# Patient Record
Sex: Female | Born: 1950 | Race: White | Hispanic: No | Marital: Single | Smoking: Never smoker
Health system: Southern US, Community
[De-identification: ages and names within clinical notes are randomized; demographics above are authoritative.]

## PROBLEM LIST (undated history)

## (undated) DIAGNOSIS — G4731 Primary central sleep apnea: Secondary | ICD-10-CM

## (undated) DIAGNOSIS — D649 Anemia, unspecified: Secondary | ICD-10-CM

## (undated) DIAGNOSIS — E785 Hyperlipidemia, unspecified: Secondary | ICD-10-CM

## (undated) DIAGNOSIS — F32A Depression, unspecified: Secondary | ICD-10-CM

## (undated) DIAGNOSIS — F419 Anxiety disorder, unspecified: Secondary | ICD-10-CM

## (undated) DIAGNOSIS — G4733 Obstructive sleep apnea (adult) (pediatric): Secondary | ICD-10-CM

## (undated) DIAGNOSIS — M1990 Unspecified osteoarthritis, unspecified site: Secondary | ICD-10-CM

## (undated) DIAGNOSIS — I639 Cerebral infarction, unspecified: Secondary | ICD-10-CM

## (undated) DIAGNOSIS — I10 Essential (primary) hypertension: Secondary | ICD-10-CM

## (undated) DIAGNOSIS — T56891A Toxic effect of other metals, accidental (unintentional), initial encounter: Secondary | ICD-10-CM

## (undated) DIAGNOSIS — F329 Major depressive disorder, single episode, unspecified: Secondary | ICD-10-CM

## (undated) DIAGNOSIS — N179 Acute kidney failure, unspecified: Secondary | ICD-10-CM

## (undated) DIAGNOSIS — E870 Hyperosmolality and hypernatremia: Secondary | ICD-10-CM

## (undated) DIAGNOSIS — F259 Schizoaffective disorder, unspecified: Secondary | ICD-10-CM

## (undated) DIAGNOSIS — R55 Syncope and collapse: Secondary | ICD-10-CM

## (undated) DIAGNOSIS — R8281 Pyuria: Secondary | ICD-10-CM

## (undated) HISTORY — PX: BREAST SURGERY: SHX581

## (undated) HISTORY — DX: Primary central sleep apnea: G47.31

---

## 2014-11-07 DIAGNOSIS — Z79899 Other long term (current) drug therapy: Secondary | ICD-10-CM | POA: Diagnosis not present

## 2014-11-07 DIAGNOSIS — Z23 Encounter for immunization: Secondary | ICD-10-CM | POA: Diagnosis not present

## 2014-11-21 DIAGNOSIS — D2261 Melanocytic nevi of right upper limb, including shoulder: Secondary | ICD-10-CM | POA: Diagnosis not present

## 2014-11-21 DIAGNOSIS — L853 Xerosis cutis: Secondary | ICD-10-CM | POA: Diagnosis not present

## 2014-11-21 DIAGNOSIS — D2262 Melanocytic nevi of left upper limb, including shoulder: Secondary | ICD-10-CM | POA: Diagnosis not present

## 2014-11-21 DIAGNOSIS — B351 Tinea unguium: Secondary | ICD-10-CM | POA: Diagnosis not present

## 2014-11-27 DIAGNOSIS — F251 Schizoaffective disorder, depressive type: Secondary | ICD-10-CM | POA: Diagnosis not present

## 2015-01-23 DIAGNOSIS — F251 Schizoaffective disorder, depressive type: Secondary | ICD-10-CM | POA: Diagnosis not present

## 2015-02-22 DIAGNOSIS — H40003 Preglaucoma, unspecified, bilateral: Secondary | ICD-10-CM | POA: Diagnosis not present

## 2015-03-13 DIAGNOSIS — F251 Schizoaffective disorder, depressive type: Secondary | ICD-10-CM | POA: Diagnosis not present

## 2015-04-18 DIAGNOSIS — M7062 Trochanteric bursitis, left hip: Secondary | ICD-10-CM | POA: Diagnosis not present

## 2015-04-18 DIAGNOSIS — M25552 Pain in left hip: Secondary | ICD-10-CM | POA: Diagnosis not present

## 2015-06-13 DIAGNOSIS — F251 Schizoaffective disorder, depressive type: Secondary | ICD-10-CM | POA: Diagnosis not present

## 2015-07-18 DIAGNOSIS — F251 Schizoaffective disorder, depressive type: Secondary | ICD-10-CM | POA: Diagnosis not present

## 2015-08-30 DIAGNOSIS — H2511 Age-related nuclear cataract, right eye: Secondary | ICD-10-CM | POA: Diagnosis not present

## 2015-08-30 DIAGNOSIS — H2513 Age-related nuclear cataract, bilateral: Secondary | ICD-10-CM | POA: Diagnosis not present

## 2015-08-30 DIAGNOSIS — H2512 Age-related nuclear cataract, left eye: Secondary | ICD-10-CM | POA: Diagnosis not present

## 2015-08-30 DIAGNOSIS — H401131 Primary open-angle glaucoma, bilateral, mild stage: Secondary | ICD-10-CM | POA: Diagnosis not present

## 2015-10-15 DIAGNOSIS — Z79899 Other long term (current) drug therapy: Secondary | ICD-10-CM | POA: Diagnosis not present

## 2015-10-15 DIAGNOSIS — F251 Schizoaffective disorder, depressive type: Secondary | ICD-10-CM | POA: Diagnosis not present

## 2015-10-22 DIAGNOSIS — F251 Schizoaffective disorder, depressive type: Secondary | ICD-10-CM | POA: Diagnosis not present

## 2015-11-07 DIAGNOSIS — F251 Schizoaffective disorder, depressive type: Secondary | ICD-10-CM | POA: Diagnosis not present

## 2015-11-20 DIAGNOSIS — Z23 Encounter for immunization: Secondary | ICD-10-CM | POA: Diagnosis not present

## 2015-11-20 DIAGNOSIS — G2119 Other drug induced secondary parkinsonism: Secondary | ICD-10-CM | POA: Diagnosis not present

## 2015-11-20 DIAGNOSIS — E785 Hyperlipidemia, unspecified: Secondary | ICD-10-CM | POA: Diagnosis not present

## 2015-11-20 DIAGNOSIS — N390 Urinary tract infection, site not specified: Secondary | ICD-10-CM | POA: Diagnosis not present

## 2015-11-20 DIAGNOSIS — F250 Schizoaffective disorder, bipolar type: Secondary | ICD-10-CM | POA: Diagnosis not present

## 2015-11-20 DIAGNOSIS — N183 Chronic kidney disease, stage 3 (moderate): Secondary | ICD-10-CM | POA: Diagnosis not present

## 2015-11-20 DIAGNOSIS — F3131 Bipolar disorder, current episode depressed, mild: Secondary | ICD-10-CM | POA: Diagnosis not present

## 2015-11-26 DIAGNOSIS — F251 Schizoaffective disorder, depressive type: Secondary | ICD-10-CM | POA: Diagnosis not present

## 2015-12-05 DIAGNOSIS — F251 Schizoaffective disorder, depressive type: Secondary | ICD-10-CM | POA: Diagnosis not present

## 2015-12-31 DIAGNOSIS — F251 Schizoaffective disorder, depressive type: Secondary | ICD-10-CM | POA: Diagnosis not present

## 2016-02-13 DIAGNOSIS — F251 Schizoaffective disorder, depressive type: Secondary | ICD-10-CM | POA: Diagnosis not present

## 2016-02-20 DIAGNOSIS — R51 Headache: Secondary | ICD-10-CM | POA: Diagnosis not present

## 2016-02-20 DIAGNOSIS — W01198A Fall on same level from slipping, tripping and stumbling with subsequent striking against other object, initial encounter: Secondary | ICD-10-CM | POA: Diagnosis not present

## 2016-02-20 DIAGNOSIS — G44309 Post-traumatic headache, unspecified, not intractable: Secondary | ICD-10-CM | POA: Diagnosis not present

## 2016-02-20 DIAGNOSIS — S0990XA Unspecified injury of head, initial encounter: Secondary | ICD-10-CM | POA: Diagnosis not present

## 2016-03-04 DIAGNOSIS — F250 Schizoaffective disorder, bipolar type: Secondary | ICD-10-CM | POA: Diagnosis not present

## 2016-03-04 DIAGNOSIS — E785 Hyperlipidemia, unspecified: Secondary | ICD-10-CM | POA: Diagnosis not present

## 2016-03-04 DIAGNOSIS — F3131 Bipolar disorder, current episode depressed, mild: Secondary | ICD-10-CM | POA: Diagnosis not present

## 2016-03-04 DIAGNOSIS — G2119 Other drug induced secondary parkinsonism: Secondary | ICD-10-CM | POA: Diagnosis not present

## 2016-03-04 DIAGNOSIS — F259 Schizoaffective disorder, unspecified: Secondary | ICD-10-CM | POA: Diagnosis not present

## 2016-03-04 DIAGNOSIS — R944 Abnormal results of kidney function studies: Secondary | ICD-10-CM | POA: Diagnosis not present

## 2016-03-04 DIAGNOSIS — N183 Chronic kidney disease, stage 3 (moderate): Secondary | ICD-10-CM | POA: Diagnosis not present

## 2016-03-06 DIAGNOSIS — H2513 Age-related nuclear cataract, bilateral: Secondary | ICD-10-CM | POA: Diagnosis not present

## 2016-03-06 DIAGNOSIS — H401131 Primary open-angle glaucoma, bilateral, mild stage: Secondary | ICD-10-CM | POA: Diagnosis not present

## 2016-04-07 DIAGNOSIS — Z7689 Persons encountering health services in other specified circumstances: Secondary | ICD-10-CM | POA: Diagnosis not present

## 2016-04-07 DIAGNOSIS — N183 Chronic kidney disease, stage 3 (moderate): Secondary | ICD-10-CM | POA: Diagnosis not present

## 2016-04-07 DIAGNOSIS — F258 Other schizoaffective disorders: Secondary | ICD-10-CM | POA: Diagnosis not present

## 2016-04-07 DIAGNOSIS — Z09 Encounter for follow-up examination after completed treatment for conditions other than malignant neoplasm: Secondary | ICD-10-CM | POA: Diagnosis not present

## 2016-04-09 DIAGNOSIS — F251 Schizoaffective disorder, depressive type: Secondary | ICD-10-CM | POA: Diagnosis not present

## 2016-04-10 DIAGNOSIS — N183 Chronic kidney disease, stage 3 (moderate): Secondary | ICD-10-CM | POA: Diagnosis not present

## 2016-04-10 DIAGNOSIS — N281 Cyst of kidney, acquired: Secondary | ICD-10-CM | POA: Diagnosis not present

## 2016-04-18 DIAGNOSIS — I69952 Hemiplegia and hemiparesis following unspecified cerebrovascular disease affecting left dominant side: Secondary | ICD-10-CM | POA: Diagnosis not present

## 2016-04-18 DIAGNOSIS — I69951 Hemiplegia and hemiparesis following unspecified cerebrovascular disease affecting right dominant side: Secondary | ICD-10-CM | POA: Diagnosis not present

## 2016-04-18 DIAGNOSIS — I214 Non-ST elevation (NSTEMI) myocardial infarction: Secondary | ICD-10-CM | POA: Diagnosis not present

## 2016-04-18 DIAGNOSIS — R269 Unspecified abnormalities of gait and mobility: Secondary | ICD-10-CM | POA: Diagnosis not present

## 2016-04-18 DIAGNOSIS — G9340 Encephalopathy, unspecified: Secondary | ICD-10-CM | POA: Diagnosis present

## 2016-04-18 DIAGNOSIS — R9431 Abnormal electrocardiogram [ECG] [EKG]: Secondary | ICD-10-CM | POA: Diagnosis not present

## 2016-04-18 DIAGNOSIS — R51 Headache: Secondary | ICD-10-CM | POA: Diagnosis not present

## 2016-04-18 DIAGNOSIS — M25561 Pain in right knee: Secondary | ICD-10-CM | POA: Diagnosis not present

## 2016-04-18 DIAGNOSIS — G20 Parkinson's disease: Secondary | ICD-10-CM | POA: Diagnosis present

## 2016-04-18 DIAGNOSIS — Z9181 History of falling: Secondary | ICD-10-CM | POA: Diagnosis not present

## 2016-04-18 DIAGNOSIS — I63342 Cerebral infarction due to thrombosis of left cerebellar artery: Secondary | ICD-10-CM | POA: Diagnosis not present

## 2016-04-18 DIAGNOSIS — I959 Hypotension, unspecified: Secondary | ICD-10-CM | POA: Diagnosis not present

## 2016-04-18 DIAGNOSIS — Z79899 Other long term (current) drug therapy: Secondary | ICD-10-CM | POA: Diagnosis not present

## 2016-04-18 DIAGNOSIS — M6282 Rhabdomyolysis: Secondary | ICD-10-CM | POA: Diagnosis present

## 2016-04-18 DIAGNOSIS — R7989 Other specified abnormal findings of blood chemistry: Secondary | ICD-10-CM | POA: Diagnosis not present

## 2016-04-18 DIAGNOSIS — N183 Chronic kidney disease, stage 3 (moderate): Secondary | ICD-10-CM | POA: Diagnosis present

## 2016-04-18 DIAGNOSIS — N179 Acute kidney failure, unspecified: Secondary | ICD-10-CM | POA: Diagnosis not present

## 2016-04-18 DIAGNOSIS — E869 Volume depletion, unspecified: Secondary | ICD-10-CM | POA: Diagnosis present

## 2016-04-18 DIAGNOSIS — I639 Cerebral infarction, unspecified: Secondary | ICD-10-CM

## 2016-04-18 DIAGNOSIS — E860 Dehydration: Secondary | ICD-10-CM | POA: Diagnosis present

## 2016-04-18 DIAGNOSIS — R531 Weakness: Secondary | ICD-10-CM | POA: Diagnosis not present

## 2016-04-18 DIAGNOSIS — E872 Acidosis: Secondary | ICD-10-CM | POA: Diagnosis present

## 2016-04-18 DIAGNOSIS — M25519 Pain in unspecified shoulder: Secondary | ICD-10-CM | POA: Diagnosis not present

## 2016-04-18 DIAGNOSIS — G219 Secondary parkinsonism, unspecified: Secondary | ICD-10-CM | POA: Diagnosis not present

## 2016-04-18 DIAGNOSIS — S8001XA Contusion of right knee, initial encounter: Secondary | ICD-10-CM | POA: Diagnosis not present

## 2016-04-18 DIAGNOSIS — R404 Transient alteration of awareness: Secondary | ICD-10-CM | POA: Diagnosis not present

## 2016-04-18 DIAGNOSIS — R6511 Systemic inflammatory response syndrome (SIRS) of non-infectious origin with acute organ dysfunction: Secondary | ICD-10-CM | POA: Diagnosis present

## 2016-04-18 DIAGNOSIS — Z7401 Bed confinement status: Secondary | ICD-10-CM | POA: Diagnosis not present

## 2016-04-18 DIAGNOSIS — E870 Hyperosmolality and hypernatremia: Secondary | ICD-10-CM | POA: Diagnosis not present

## 2016-04-18 DIAGNOSIS — S79911A Unspecified injury of right hip, initial encounter: Secondary | ICD-10-CM | POA: Diagnosis not present

## 2016-04-18 DIAGNOSIS — G8250 Quadriplegia, unspecified: Secondary | ICD-10-CM | POA: Diagnosis not present

## 2016-04-18 DIAGNOSIS — S0990XA Unspecified injury of head, initial encounter: Secondary | ICD-10-CM | POA: Diagnosis not present

## 2016-04-18 DIAGNOSIS — N189 Chronic kidney disease, unspecified: Secondary | ICD-10-CM | POA: Diagnosis not present

## 2016-04-18 DIAGNOSIS — R6510 Systemic inflammatory response syndrome (SIRS) of non-infectious origin without acute organ dysfunction: Secondary | ICD-10-CM | POA: Diagnosis not present

## 2016-04-18 DIAGNOSIS — R740 Nonspecific elevation of levels of transaminase and lactic acid dehydrogenase [LDH]: Secondary | ICD-10-CM | POA: Diagnosis not present

## 2016-04-18 DIAGNOSIS — G210 Malignant neuroleptic syndrome: Secondary | ICD-10-CM | POA: Diagnosis not present

## 2016-04-18 DIAGNOSIS — S8991XA Unspecified injury of right lower leg, initial encounter: Secondary | ICD-10-CM | POA: Diagnosis not present

## 2016-04-18 DIAGNOSIS — Z743 Need for continuous supervision: Secondary | ICD-10-CM | POA: Diagnosis not present

## 2016-04-18 DIAGNOSIS — I21A1 Myocardial infarction type 2: Secondary | ICD-10-CM | POA: Diagnosis not present

## 2016-04-18 DIAGNOSIS — M25551 Pain in right hip: Secondary | ICD-10-CM | POA: Diagnosis not present

## 2016-04-18 DIAGNOSIS — R4702 Dysphasia: Secondary | ICD-10-CM | POA: Diagnosis not present

## 2016-04-18 DIAGNOSIS — F259 Schizoaffective disorder, unspecified: Secondary | ICD-10-CM | POA: Diagnosis present

## 2016-04-18 DIAGNOSIS — W06XXXA Fall from bed, initial encounter: Secondary | ICD-10-CM | POA: Diagnosis not present

## 2016-04-18 DIAGNOSIS — D72829 Elevated white blood cell count, unspecified: Secondary | ICD-10-CM | POA: Diagnosis not present

## 2016-04-18 DIAGNOSIS — Z888 Allergy status to other drugs, medicaments and biological substances status: Secondary | ICD-10-CM | POA: Diagnosis not present

## 2016-04-18 DIAGNOSIS — Z8673 Personal history of transient ischemic attack (TIA), and cerebral infarction without residual deficits: Secondary | ICD-10-CM | POA: Diagnosis not present

## 2016-04-18 DIAGNOSIS — E43 Unspecified severe protein-calorie malnutrition: Secondary | ICD-10-CM | POA: Diagnosis present

## 2016-04-18 DIAGNOSIS — R5381 Other malaise: Secondary | ICD-10-CM | POA: Diagnosis present

## 2016-04-18 DIAGNOSIS — E669 Obesity, unspecified: Secondary | ICD-10-CM | POA: Diagnosis present

## 2016-04-18 HISTORY — DX: Cerebral infarction, unspecified: I63.9

## 2016-04-21 DIAGNOSIS — I129 Hypertensive chronic kidney disease with stage 1 through stage 4 chronic kidney disease, or unspecified chronic kidney disease: Secondary | ICD-10-CM | POA: Diagnosis present

## 2016-04-21 DIAGNOSIS — R740 Nonspecific elevation of levels of transaminase and lactic acid dehydrogenase [LDH]: Secondary | ICD-10-CM | POA: Diagnosis not present

## 2016-04-21 DIAGNOSIS — E43 Unspecified severe protein-calorie malnutrition: Secondary | ICD-10-CM | POA: Diagnosis present

## 2016-04-21 DIAGNOSIS — I69998 Other sequelae following unspecified cerebrovascular disease: Secondary | ICD-10-CM | POA: Diagnosis not present

## 2016-04-21 DIAGNOSIS — E870 Hyperosmolality and hypernatremia: Secondary | ICD-10-CM | POA: Diagnosis not present

## 2016-04-21 DIAGNOSIS — E232 Diabetes insipidus: Secondary | ICD-10-CM | POA: Diagnosis present

## 2016-04-21 DIAGNOSIS — I10 Essential (primary) hypertension: Secondary | ICD-10-CM | POA: Diagnosis not present

## 2016-04-21 DIAGNOSIS — I639 Cerebral infarction, unspecified: Secondary | ICD-10-CM | POA: Diagnosis not present

## 2016-04-21 DIAGNOSIS — R631 Polydipsia: Secondary | ICD-10-CM | POA: Diagnosis not present

## 2016-04-21 DIAGNOSIS — M25551 Pain in right hip: Secondary | ICD-10-CM | POA: Diagnosis present

## 2016-04-21 DIAGNOSIS — J029 Acute pharyngitis, unspecified: Secondary | ICD-10-CM | POA: Diagnosis not present

## 2016-04-21 DIAGNOSIS — M6282 Rhabdomyolysis: Secondary | ICD-10-CM | POA: Diagnosis present

## 2016-04-21 DIAGNOSIS — F259 Schizoaffective disorder, unspecified: Secondary | ICD-10-CM | POA: Diagnosis present

## 2016-04-21 DIAGNOSIS — N183 Chronic kidney disease, stage 3 (moderate): Secondary | ICD-10-CM | POA: Diagnosis present

## 2016-04-21 DIAGNOSIS — I21A1 Myocardial infarction type 2: Secondary | ICD-10-CM | POA: Diagnosis present

## 2016-04-21 DIAGNOSIS — N179 Acute kidney failure, unspecified: Secondary | ICD-10-CM | POA: Diagnosis not present

## 2016-04-21 DIAGNOSIS — R682 Dry mouth, unspecified: Secondary | ICD-10-CM | POA: Diagnosis not present

## 2016-04-21 DIAGNOSIS — N251 Nephrogenic diabetes insipidus: Secondary | ICD-10-CM | POA: Diagnosis not present

## 2016-05-22 ENCOUNTER — Ambulatory Visit: Payer: Medicare Other | Attending: Family Medicine | Admitting: Rehabilitation

## 2016-05-22 ENCOUNTER — Encounter: Payer: Self-pay | Admitting: Rehabilitation

## 2016-05-22 DIAGNOSIS — M6281 Muscle weakness (generalized): Secondary | ICD-10-CM | POA: Diagnosis not present

## 2016-05-22 DIAGNOSIS — R41841 Cognitive communication deficit: Secondary | ICD-10-CM | POA: Diagnosis not present

## 2016-05-22 DIAGNOSIS — I63449 Cerebral infarction due to embolism of unspecified cerebellar artery: Secondary | ICD-10-CM | POA: Diagnosis not present

## 2016-05-22 DIAGNOSIS — R2689 Other abnormalities of gait and mobility: Secondary | ICD-10-CM

## 2016-05-22 DIAGNOSIS — R2681 Unsteadiness on feet: Secondary | ICD-10-CM | POA: Insufficient documentation

## 2016-05-22 DIAGNOSIS — R41844 Frontal lobe and executive function deficit: Secondary | ICD-10-CM | POA: Insufficient documentation

## 2016-05-22 NOTE — Therapy (Signed)
[facility] [address] [place], [place], [address] Phone: [phone]   Fax:  [phone]  Physical Therapy Evaluation  Patient Details  Name: Bridget Jackson MRN: [id number] Date of Birth: 01/03/1951 Referring Provider: Earline Mayotte, MD  Encounter Date: 05/22/2016      PT End of Session - 05/22/16 2014    Visit Number 1   Number of Visits 17   Date for PT Re-Evaluation 07/21/16   Authorization Type MCR prim, BCBS sec G Code needed every 10th visit   PT Start Time 1455   PT Stop Time 1539   PT Time Calculation (min) 44 min   Activity Tolerance Patient tolerated treatment well   Behavior During Therapy Flat affect      History reviewed. No pertinent past medical history.  History reviewed. No pertinent surgical history.  There were no vitals filed for this visit.       Subjective Assessment - 05/22/16 1457    Subjective Pt presents s/p inferior cerebellar CVA and NSTEMI with reports of wanting to walk better and getting steadier on her feet.  "I also want to be able to get in and out of bed easier at night."   Patient is accompained by: Family member  [employer] hold activities;Walking;Standing   Patient Stated Goals "I want to walk better."    Currently in Pain? Yes   Pain Score 6    Pain Location Leg   Pain Orientation Right   Pain Descriptors / Indicators Aching   Pain Type Acute pain   Pain Onset 1 to 4 weeks ago   Pain Frequency Intermittent   Aggravating Factors  walking    Pain Relieving Factors rest             [facility] PT Assessment - 05/22/16 0001      Assessment   Medical Diagnosis Cerebellar CVA, NSTEMI   Referring Provider Earline Mayotte, MD   Onset Date/Surgical Date 04/18/16   Prior Therapy acute and IP rehab in [facility] [place], [place]     Precautions   Precautions Fall     Restrictions   Weight Bearing Restrictions No     Balance Screen   Has  the patient fallen in the past 6 months No   Has the patient had a decrease in activity level because of a fear of falling?  Yes   Is the patient reluctant to leave their home because of a fear of falling?  Yes     Home Environment   Living Environment Private residence   Living Arrangements Other relatives  Sister [name], works from home   Available Help at Discharge Family;Available 24 hours/day  has left to run errands for 1-2 hrs   Type of Home --  Condo   Home Access Stairs to enter   Entrance Stairs-Number of Steps 2 then 1   Entrance Stairs-Rails None   Home Layout One level   Home Equipment Wheelchair - manual;Walker - 4 wheels;Cane - single point;Tub bench;Hand held shower head  No AD for walk, elevated commode seat, bed rail added to bed   Additional Comments [name] is there 24/7 but will run errands when needed, leaves everything near pt that she may need.      Prior Function   Level of Independence Independent   Vocation Retired   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 [employer] (stir fry is her dish), writing letters,  likes to talk on the phone, likes to have friends over      Cognition   Overall Cognitive Status Impaired/Different from baseline   Area of Impairment Attention;Memory;Problem solving;Following commands   Current Attention Level Selective   Memory Decreased short-term memory   Following Commands Follows one step commands with increased time;Follows multi-step commands inconsistently   Problem Solving Slow processing;Requires verbal cues     Observation/Other Assessments   Focus on Therapeutic Outcomes (FOTO)  ABC 29.4     Sensation   Light Touch Appears Intact   Hot/Cold Appears Intact     Coordination   Gross Motor Movements are Fluid and Coordinated No  in LEs   Fine Motor Movements are Fluid and Coordinated No   Heel Shin Test decreased fluidity and decreased ROM/excursion     Posture/Postural Control    Posture/Postural Control Postural limitations   Posture Comments Prior to CVA, would have tendency to lean to the R     ROM / Strength   AROM / PROM / Strength Strength     Strength   Overall Strength Deficits   Overall Strength Comments Grossly 3+/5 to 4/5 overall, some weakness noted more in hips      Transfers   Transfers Sit to Stand;Stand to Sit   Sit to Stand 5: Supervision   Sit to Stand Details Verbal cues for precautions/safety   Five time sit to stand comments  21.91 secs with single UE support on arm rest   Stand to Sit 5: Supervision   Stand to Sit Details (indicate cue type and reason) Verbal cues for precautions/safety     Ambulation/Gait   Ambulation/Gait Yes   Ambulation/Gait Assistance 5: Supervision;4: Min guard   Ambulation/Gait Assistance Details Pt ambulates with guarded posture, does not turn head to scan environment.  Slow gait speed and short stride length.  Min/guard needed with increased balance challenge.    Ambulation Distance (Feet) 345 Feet   Assistive device None   Gait Pattern Step-through pattern;Decreased stride length;Decreased arm swing - right;Decreased arm swing - left;Lateral trunk lean to right;Trunk flexed   Ambulation Surface Level;Indoor   Stairs Yes   Stairs Assistance 4: Min guard   Stair Management Technique Two rails;Step to pattern;Forwards   Number of Stairs 4   Height of Stairs 6     Standardized Balance Assessment   Standardized Balance Assessment Dynamic Gait Index     Dynamic Gait Index   Level Surface Mild Impairment   Change in Gait Speed Moderate Impairment   Gait with Horizontal Head Turns Mild Impairment   Gait with Vertical Head Turns Mild Impairment   Gait and Pivot Turn Mild Impairment   Step Over Obstacle Moderate Impairment   Step Around Obstacles Mild Impairment   Steps Moderate Impairment   Total Score 13   DGI comment: Scores of 19 or less are predictive of falls in older community living adults                            PT Education - 05/22/16 2013    Education provided Yes   Education Details evaluation findings, POC, goals   Person(s) Educated Patient;Other (comment)  sister   Methods Explanation   Comprehension Verbalized understanding          PT Short Term Goals - 05/22/16 2025      PT SHORT TERM GOAL #1   Title Pt/sister will be independent with initial HEP  in order to indicate improved functional mobility and decreased fall risk. (Target Date: 06/19/16)   Time 4   Period Weeks   Status New     PT SHORT TERM GOAL #2   Title Pt will perform 5TSS in </=15 secs without UE support in order to indicate improved BLE strength.     Time 4   Period Weeks   Status New     PT SHORT TERM GOAL #3   Title Will assess gait speed and write appropriate goal.    Time 4   Period Weeks   Status New     PT SHORT TERM GOAL #4   Title Will assess 6MWT and improve distance by 27' in order to indicate improved functional endurance.     Time 4   Period Weeks   Status New     PT SHORT TERM GOAL #5   Title Pt will improve DGI to 16/24 in order to indicate decreased fall risk.     Time 4   Period Weeks   Status New     Additional Short Term Goals   Additional Short Term Goals Yes     PT SHORT TERM GOAL #6   Title Pt will ambulate over varying indoor surfaces while scanning environment up to 300' w/ LRAD at mod I level in order to indicate safe home negotiation.    Time 4   Period Weeks   Status New           PT Long Term Goals - 05/22/16 2030      PT LONG TERM GOAL #1   Title Pt/sister will be independent with final HEP in order to indicate improved functional mobility and decreased fall risk (Target Date: 07/17/16)   Time 8   Period Weeks   Status New     PT LONG TERM GOAL #2   Title Pt will negotiate 2 steps, then 1 step without rails at S level in alternating fashion in order to indicate safe entry/exit at home.     Time 8   Period Weeks    Status New     PT LONG TERM GOAL #3   Title Pt will ambulate with gait speed >/= 2.00 ft/sec in order to indicate decreased fall risk and improved efficiency of gait.     Time 8   Period Weeks   Status New     PT LONG TERM GOAL #4   Title Pt will improve DGI to >19/24 in order to indicate decreased fall risk.     Time 8   Period Weeks   Status New     PT LONG TERM GOAL #5   Title Pt will improve 6MWT distance by 150' from baseline in order to indicate improved functional endurance.     Time 8   Period Weeks   Status New     Additional Long Term Goals   Additional Long Term Goals Yes     PT LONG TERM GOAL #6   Title Pt will ambulate up to 500' over unlevel paved surfaces w/ LRAD at mod I level in order to indicate return to community activities.     Time 8   Period Weeks   Status New               Plan - 05/22/16 2015    Clinical Impression Statement Pt presents s/p inferior cerebella CVA, NSTEMI, and nephrogenic diabetes insipidus in setting of chronic Lithium dosage and transaminitis on 04/18/16  with hospitalization in [place], [place].  Pt requires 24/7 S at this time and therefore has moved to [place] with her sister, who provies 24/7 S.  Pt with history of schizoaffective disorder and is on Lithium, CKD, and scoliosis that may impact progress in therapy.  Upon PT evaluation, note pt with generalized weakness, decreased cognition, and poor balance and endurance.  Her DGI score of 13/24 is indicative of high fall risk as well as her gait speed.  Her 5TSS of 21.91 secs with single UE support suggests BLE weakness.  Pt needing to take several rest breaks during session due to fatigue.  Pt is of evolving presentation and moderate complexity.  Pt will benefit from skilled OP neuro PT in order to address deficits.     Rehab Potential Good   Clinical Impairments Affecting Rehab Potential see past medical history above   PT Frequency 2x / week   PT Duration 8 weeks   PT  Treatment/Interventions ADLs/Self Care Home Management;DME Instruction;Gait training;Stair training;Functional mobility training;Therapeutic activities;Therapeutic exercise;Balance training;Neuromuscular re-education;Cognitive remediation;Patient/family education;Vestibular;Energy conservation   PT Next Visit Plan 6MWT, re-assess gait speed and write goal, initiate HEP for strength and balance, scanning enviornmnet, head turns with gait   Consulted and Agree with Plan of Care Patient;Family member/caregiver   Family Member Consulted sister, [name]       Patient will benefit from skilled therapeutic intervention in order to improve the following deficits and impairments:  Decreased activity tolerance, Decreased balance, Decreased cognition, Decreased coordination, Decreased endurance, Decreased knowledge of use of DME, Decreased mobility, Decreased strength, Impaired perceived functional ability, Impaired flexibility, Postural dysfunction  Visit Diagnosis: 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, unspecified blood vessel laterality ([place]) - Plan: PT plan of care cert/re-cert  Unsteadiness on feet - Plan: PT plan of care cert/re-cert  Muscle weakness (generalized) - Plan: PT plan of care cert/re-cert  Other abnormalities of gait and mobility - Plan: PT plan of care cert/re-cert      G-Codes - 17/40/81 May 05, 2036    Functional Assessment Tool Used (Outpatient Only) DGI: 13/24   Functional Limitation Mobility: Walking and moving around   Mobility: Walking and Moving Around Current Status [phone]) At least 40 percent but less than 60 percent impaired, limited or restricted   Mobility: Walking and Moving Around Goal Status [phone]) At least 1 percent but less than 20 percent impaired, limited or restricted       Problem List There are no active problems to display for this patient.  Cameron Sprang, PT, MPT [facility] [address]  [place], [place], [address] Phone: [phone]   Fax:  [phone] 05/22/16, 8:43 PM  Name: Baleria Wyman MRN: [id number] Date of Birth: 05/04/1950

## 2016-05-26 ENCOUNTER — Ambulatory Visit: Payer: Medicare Other

## 2016-05-26 ENCOUNTER — Ambulatory Visit: Payer: Medicare Other | Admitting: Occupational Therapy

## 2016-05-26 ENCOUNTER — Ambulatory Visit: Payer: Medicare Other | Admitting: Speech Pathology

## 2016-05-26 ENCOUNTER — Encounter: Payer: Self-pay | Admitting: Occupational Therapy

## 2016-05-26 DIAGNOSIS — I63449 Cerebral infarction due to embolism of unspecified cerebellar artery: Secondary | ICD-10-CM | POA: Diagnosis not present

## 2016-05-26 DIAGNOSIS — R41841 Cognitive communication deficit: Secondary | ICD-10-CM | POA: Diagnosis not present

## 2016-05-26 DIAGNOSIS — R41844 Frontal lobe and executive function deficit: Secondary | ICD-10-CM

## 2016-05-26 DIAGNOSIS — R2681 Unsteadiness on feet: Secondary | ICD-10-CM | POA: Diagnosis not present

## 2016-05-26 DIAGNOSIS — R2689 Other abnormalities of gait and mobility: Secondary | ICD-10-CM | POA: Diagnosis not present

## 2016-05-26 DIAGNOSIS — M6281 Muscle weakness (generalized): Secondary | ICD-10-CM | POA: Diagnosis not present

## 2016-05-26 NOTE — Patient Instructions (Signed)
   Cognitive Activities you can do at home:   - [place] (easy level)  - [place] for Talking with People who have Aphasia  . Say one thing at a time . Don't  rush - slow down, be patient . Talk face to face . Reduce background noise . Relax - be natural . Use pen and paper . Write down key words . Draw diagrams or pictures . Don't pretend you understand . Ask what helps . Recap - check you both understand . Be a partner, not a therapist   Aphasia does not affect intelligence, only language. The person with aphasia can still: make decisions, have opinions, and socialize.   Describing words  What group does it belong to?  What do I use it for?  Where can I find it?  What does it LOOK like?  What other words go with it?  What is the 1st sound of the word?          Many Ways to Communicate  Describe it Write it Draw it Gesture it Use related words  There's an App for that: Family Feud, Heads up, Stop-fun categories, What if, Noe Gens  Provided by: Barnabas Lister [place], [phone]

## 2016-05-26 NOTE — Therapy (Signed)
[facility] [address] [place], [place], [address] Phone: [phone]   Fax:  [phone]  Occupational Therapy Evaluation  Patient Details  Name: Bridget Jackson MRN: [id number] Date of Birth: 01-05-1951 Referring Provider: Dr. Earline Mayotte  Encounter Date: 05/26/2016      OT End of Session - 05/26/16 1305    Visit Number 1   Number of Visits 8   Date for OT Re-Evaluation 06/23/16   Authorization Type medicare with BCBS as secondary will need PN and G code every 10th visit   Authorization Time Period 60 days   Authorization - Visit Number 1   Authorization - Number of Visits 10   OT Start Time 1150   OT Stop Time 1233   OT Time Calculation (min) 43 min   Activity Tolerance Patient tolerated treatment well      History reviewed. No pertinent past medical history.  History reviewed. No pertinent surgical history.  There were no vitals filed for this visit.      Subjective Assessment - 05/26/16 1158    Currently in Pain? Yes   Pain Score 8    Pain Location Knee   Pain Orientation Right   Pain Descriptors / Indicators Aching   Pain Type Chronic pain   Pain Onset 1 to 4 weeks ago  started after the stroke but sister says she has had jt pain prior in her hips.   Pain Frequency Constant   Aggravating Factors  sititng too long it will make hurt when I try to get up    Pain Relieving Factors after I walk then the pain eases off.            [facility] OT Assessment - 05/26/16 1200      Assessment   Diagnosis Inferior cerebellar CVA   Referring Provider Dr. Earline Mayotte   Onset Date 04/18/16   Prior Therapy inpt rehab for PT OT and ST     Precautions   Precautions Fall     Restrictions   Weight Bearing Restrictions No     Balance Screen   Has the patient fallen in the past 6 months No  Pt seeing PT currently      Home  Environment   Family/patient expects to be discharged to:  Private residence   Living Arrangements Other relatives  sister   Available Help at Discharge Available 24 hours/day  sister leaves to run errans for 1-2 hours   Type of Home Other (Comment)  [place] One level   Bathroom Shower/Tub Tub/Shower unit   Additional Comments transfer tub bench, hand held shower, grab bars, grab bars around toilet, raised toilet seat with handles.      Prior Function   Level of Independence Independent   Vocation Retired   [employer] was a Journalist, newspaper, Clinical cytogeneticist, having friends over, talking on the phone     ADL   Eating/Feeding Minimal assistance  set up   Grooming Independent   Upper Body Bathing Minimal assistance  for back    Lower Body Bathing Supervision/safety  cues for safety   Upper Body Dressing Independent   Lower Body Dressing Independent   Toilet Tranfer Supervision/safety  at night- balance worse at night, cues for safety   [place] Transfer Supervision/safety     IADL   Shopping Completely unable to shop  Light Housekeeping Performs light daily tasks such as dishwashing, bed making   Meal Prep Needs to have meals prepared and served   [employer] on family or friends for transportation  was driving short distances prior to CVA   Medication Management Is responsible for taking medication in correct dosages at correct time   Physiological scientist financial matters independently (budgets, writes checks, pays rent, bills goes to bank), collects and keeps track of income     Mobility   Mobility Status Needs assist   Mobility Status Comments supervision in the community and on stairs     Written Expression   Dominant Hand Right     Vision - History   Baseline Vision Wears glasses all the time   Additional Comments Pt denies any visual changes.  Denies any diplopia at any time.      Activity Tolerance   Activity Tolerance Tolerates 30 min activity with muliple rests     Cognition   Area of Impairment Attention;Memory;Safety/judgement   Memory Decreased short-term memory   Problem Solving Slow processing;Decreased initiation   Awareness Impaired   Awareness Impairment Emergent impairment   Problem Solving Impaired   Executive Function Reasoning;Decision Making;Initiating     Sensation   Light Touch Appears Intact   Hot/Cold Appears Intact   Proprioception Appears Intact     Coordination   Gross Motor Movements are Fluid and Coordinated Yes   Fine Motor Movements are Fluid and Coordinated No  very slowed  movement likely due to cognition   9 Hole Peg Test Right;Left   Right 9 Hole Peg Test 32.81   Left 9 Hole Peg Test 41.97   Other increased time on 9 hole peg appears to be due to slowed processing vs true incoordination. Pt and sister in agreement and state this is reflected functionally as well.      Tone   Assessment Location Right Upper Extremity;Left Upper Extremity     ROM / Strength   AROM / PROM / Strength AROM;Strength     AROM   Overall AROM  Within functional limits for tasks performed     Strength   Overall Strength Within functional limits for tasks performed   Overall Strength Comments BUE's     Hand Function   Right Hand Gross Grasp Functional   Right Hand Grip (lbs) 55   Left Hand Gross Grasp Impaired   Left Hand Grip (lbs) 40     RUE Tone   RUE Tone Mild  sister reports this may be from Lithium     LUE Tone   LUE Tone Within Functional Limits                              OT Long Term Goals - 05/26/16 1251      OT LONG TERM GOAL #1   Title Pt and sister will be mod I with home actvities program - 06/23/2016   Status New     OT LONG TERM GOAL #2   Title Pt will demonstrate improved grip strength for L hand by 5 pounds to assist with cutting food and food prep (baseline= 40)   Status New     OT  LONG TERM GOAL #3   Title Pt will be mod I with shower and night toilet transfers   Status New     OT LONG TERM GOAL #4   Title Pt will be mod with  bathing   Status New     OT LONG TERM GOAL #5   Title Pt will be mod I with simple hot familiar meal prep   Status New               Plan - 05/26/16 1253    Clinical Impression Statement Pt is a 66 year old female s/p cerebellar CVA on 04/18/2016 with hospitalization and inpt rehab stay.  Pt lives in [place] however has returned to [place] to live temporarily with her sister due to needing 24 hour supervision.  PMH: schizoaffective disorder with chronic lithium use,nephrotic diabetes, CKD Stage III, R hip pain.  Pt presents today with the following deficits that impact her independence in ADL, IADL and leisure:  decreased cognition including attention, ST memory, significant slowed processing, safety, judgement, decision making, initiation, problem solving; decreased activity tolerance, decreased balance, decreased L grip strength, slowed motor movement.  Pt can benfit from short course of OT to address these deficits and maximize independence. Pt to be seen by PT and ST as well.      Rehab Potential Fair   OT Frequency 2x / week   OT Duration 4 weeks   OT Treatment/Interventions Self-care/ADL training;Neuromuscular education;Therapeutic exercise;Functional Mobility Training;Cognitive remediation/compensation;Therapeutic activities;Patient/family education;Balance training   Plan intiate putty program for L hand, balance, activity tolerance, simple cooking activity with emphasis on cognition   Consulted and Agree with Plan of Care Patient;Family member/caregiver   Family Member Consulted sister       Patient will benefit from skilled therapeutic intervention in order to improve the following deficits and impairments:  Decreased activity tolerance, Decreased balance, Decreased cognition, Decreased safety awareness, Decreased mobility,  Decreased strength, Difficulty walking, Impaired UE functional use, Pain  Visit Diagnosis: Unsteadiness on feet - Plan: Ot plan of care cert/re-cert  Frontal lobe and executive function deficit - Plan: Ot plan of care cert/re-cert  Muscle weakness (generalized) - Plan: Ot plan of care cert/re-cert      G-Codes - 25/85/27 1306    Functional Assessment Tool Used (Outpatient only) skilled clnical observation   Functional Limitation Self care   Self Care Current Status (P8242) At least 20 percent but less than 40 percent impaired, limited or restricted   Self Care Goal Status (P5361) At least 1 percent but less than 20 percent impaired, limited or restricted      Problem List There are no active problems to display for this patient.   Quay Burow , OTR/L 05/26/2016, 1:09 PM  [facility] [address] [place], [place], [address] Phone: [phone]   Fax:  [phone]  Name: Bridget Jackson MRN: [id number] Date of Birth: 16-May-1950

## 2016-05-26 NOTE — Therapy (Signed)
[facility] [address] [place], [place], [address] Phone: [phone]   Fax:  [phone]  Speech Language Pathology Evaluation  Patient Details  Name: Bridget Jackson MRN: [id number] Date of Birth: 1950/03/07 Referring Provider: Dr. Earline Mayotte  Encounter Date: 05/26/2016      End of Session - 05/26/16 1712    Visit Number 1   Number of Visits 17   Date for SLP Re-Evaluation 07/24/16   SLP Start Time 9030   SLP Stop Time  1148   SLP Time Calculation (min) 46 min   Activity Tolerance Patient tolerated treatment well      No past medical history on file.  No past surgical history on file.  There were no vitals filed for this visit.      Subjective Assessment - 05/26/16 1700    Subjective "I have trouble with memroy - short term memory loss"   Patient is accompained by: Family member   Special Tests sister [name]            SLP Evaluation [facility] - 05/26/16 1700      SLP Visit Information   SLP Received On 05/26/16   Referring Provider Dr. Earline Mayotte   Onset Date 04/18/16   Medical Diagnosis cerebellar CVA     Subjective   Patient/Family Stated Goal "To be able to return to [place] and live alone"     Pain Assessment   Currently in Pain? Yes   Pain Score 8    Pain Location Knee   Pain Orientation Right   Pain Type Chronic pain   Pain Onset 1 to 4 weeks ago  started after the stroke but sister says she has had jt pain prior in her hips.   Pain Frequency Constant   Pain Relieving Factors rest   Effect of Pain on Daily Activities walking     General Information   HPI Pt is a 66 year old female s/p cerebellar CVA on 04/18/2016 with hospitalization and inpt rehab stay.  Pt lives in [place] however has returned to [place] to live temporarily with her sister due to needing 24 hour supervision.  PMH: schizoaffective disorder with chronic lithium use,nephrotic diabetes, CKD  Stage III, R hip pain.     Mobility Status Walks independently, receiving PT     Prior Functional Status   Cognitive/Linguistic Baseline Within functional limits   Type of Home Apartment    Lives With Alone   Available Support Family   Vocation Retired     Associate Professor   Overall Cognitive Status Impaired/Different from baseline   Area of Impairment Attention;Memory;Safety/judgement;Awareness;Problem solving   Current Attention Level Selective   Memory Decreased short-term memory   Following Commands Follows one step commands with increased time;Follows multi-step commands inconsistently   Problem Solving Slow processing;Decreased initiation   Awareness Impaired   Awareness Impairment Emergent impairment   Problem Solving Impaired   Problem Solving Impairment Verbal complex;Functional basic   Executive Function Reasoning;Decision Making;Initiating     Oral Motor/Sensory Function   Overall Oral Motor/Sensory Function Appears within functional limits for tasks assessed     Motor Speech   Overall Motor Speech Impaired   Respiration Within functional limits   Phonation Normal   Articulation Impaired   Level of Impairment Conversation   Intelligibility Intelligible   Motor Planning Witnin functional limits   Motor Speech Errors Unaware   Effective Techniques Over-articulate;Pause     Standardized Assessments   Standardized Assessments  Cognitive Linguistic Quick Test     Cognitive Linguistic Quick Test (Ages 18-69)   Attention Mild   Memory Mild   Executive Function Mild   Language Mild   Visuospatial Skills WNL   Severity Rating Total 16   Composite Severity Rating 12.8                         SLP Education - 05/26/16 1710    Education provided Yes   Education Details areas of impairment, goals, cognitive activities to do at home   Person(s) Educated Patient;Other (comment)  sister [name]   Methods Explanation;Handout;Verbal cues   Comprehension  Verbalized understanding          SLP Short Term Goals - 05/26/16 1724      SLP SHORT TERM GOAL #1   Title Pt will  alterate attention between 2 simple functional cognitive linguistic tasks with 85% accuracy on each and occasional min A   Time 4   Period Weeks   Status New     SLP SHORT TERM GOAL #2   Title Pt will solve mildly complex organanization, reasoning, functional math problems with 85% accuracy and rare min A   Time 4   Period Weeks   Status New     SLP SHORT TERM GOAL #3   Title Pt will complete mildly complex naming tasks with 90% accuracy and rare min A   Time 4   Period Weeks   Status New     SLP SHORT TERM GOAL #4   Title Pt will utilize external aids to recall appoinments, conversations, lists over 3 sessions with occasional min A   Time 4   Period Weeks   Status New          SLP Long Term Goals - 05/26/16 1733      SLP LONG TERM GOAL #1   Title Pt will divide attention between 2 simple cognitive linguistic tasks with 85% on each and occasional min A   Time 8   Period Weeks   Status New     SLP LONG TERM GOAL #2   Title Pt will utilize compensations for word finding episodes with rare min A 80% of opportunities   Time 8   Period Weeks   Status New     SLP LONG TERM GOAL #3   Title Pt will identify and self correct errors on cognitive tasks with occasional min A   Time 8   Period Weeks   Status New          Plan - 05/26/16 1726    Clinical Impression Statement Ms. Norment, a 66 y.o. female suffered a cerebellar CVA 04/18/16 in [place]. She has temorarily moved to [place] to be with her sister for care. Today she presents with mild to moderate cognitive linguistic deficts. The Cognitive Linguistic Quick Test revealed mild attention, executive function and language impairments. Mrs. Sperry named 8 animals and 1 word that began with a given lettr "m" in 1 minute. Overall processing is slow. She demonstrates reduced emergent awareness,  not identifying or correcting errors. Short term recall of short paragraph and visual memory are impaired. I recommend skilled ST to maximize cognitive linguistic skills for possible return home to live alone.       Patient will benefit from skilled therapeutic intervention in order to improve the following deficits and impairments:   Cognitive communication deficit - Plan: SLP plan of care cert/re-cert  G-Codes - 05/26/16 1726    Functional Assessment Tool Used NOMS   Functional Limitations Attention   Attention Current Status [id number]) At least 20 percent but less than 40 percent impaired, limited or restricted   Attention Goal Status (R1594) At least 1 percent but less than 20 percent impaired, limited or restricted      Problem List There are no active problems to display for this patient.   Knox Cervi, Annye Rusk MS, CCC-SLP 05/26/2016, 5:37 PM  [facility] [address] [place], [place], [address] Phone: [phone]   Fax:  [phone]  Name: Charda Janis MRN: [id number] Date of Birth: 1950-03-20

## 2016-05-27 ENCOUNTER — Ambulatory Visit: Payer: Medicare Other

## 2016-05-27 ENCOUNTER — Encounter: Payer: Self-pay | Admitting: Occupational Therapy

## 2016-05-27 ENCOUNTER — Ambulatory Visit: Payer: Medicare Other | Admitting: Occupational Therapy

## 2016-05-27 ENCOUNTER — Ambulatory Visit: Payer: Medicare Other | Admitting: Physical Therapy

## 2016-05-27 DIAGNOSIS — I63449 Cerebral infarction due to embolism of unspecified cerebellar artery: Secondary | ICD-10-CM | POA: Diagnosis not present

## 2016-05-27 DIAGNOSIS — M6281 Muscle weakness (generalized): Secondary | ICD-10-CM

## 2016-05-27 DIAGNOSIS — R2681 Unsteadiness on feet: Secondary | ICD-10-CM | POA: Diagnosis not present

## 2016-05-27 DIAGNOSIS — R41841 Cognitive communication deficit: Secondary | ICD-10-CM

## 2016-05-27 DIAGNOSIS — R41844 Frontal lobe and executive function deficit: Secondary | ICD-10-CM | POA: Diagnosis not present

## 2016-05-27 DIAGNOSIS — R2689 Other abnormalities of gait and mobility: Secondary | ICD-10-CM | POA: Diagnosis not present

## 2016-05-27 NOTE — Therapy (Signed)
[facility] [address] [place], [place], [address] Phone: [phone]   Fax:  [phone]  Occupational Therapy Treatment  Patient Details  Name: Bridget Jackson MRN: [id number] Date of Birth: [birth year] Referring Provider: Dr. Earline Mayotte  Encounter Date: 05/27/2016      OT End of Session - 05/27/16 1204    Visit Number 2   Number of Visits 8   Date for OT Re-Evaluation 06/23/16   Authorization Type medicare with [employer] as secondary will need PN and G code every 10th visit   Authorization Time Period 60 days   Authorization - Visit Number 2   Authorization - Number of Visits 10   OT Start Time 1110  patient in restroom   OT Stop Time 1145   OT Time Calculation (min) 35 min   Activity Tolerance Patient tolerated treatment well   Behavior During Therapy Flat affect      History reviewed. No pertinent past medical history.  History reviewed. No pertinent surgical history.  There were no vitals filed for this visit.      Subjective Assessment - 05/27/16 1202    Pain Score 7    Pain Location Knee   Pain Orientation Right   Pain Descriptors / Indicators Aching   Pain Type Chronic pain   Pain Onset 1 to 4 weeks ago   Pain Frequency Constant   Aggravating Factors  activity   Pain Relieving Factors rest            [facility] OT Assessment - 05/26/16 1200      Assessment   Diagnosis Inferior cerebellar CVA   Referring Provider Dr. Earline Mayotte   Onset Date 04/18/16   Prior Therapy inpt rehab for PT OT and ST     Precautions   Precautions Fall     Restrictions   Weight Bearing Restrictions No     Balance Screen   Has the patient fallen in the past 6 months No  Pt seeing PT currently      Home  Environment   Family/patient expects to be discharged to: Private residence   Living Arrangements Other relatives  sister   Available Help at Discharge Available 24 hours/day  sister  leaves to run errans for 1-2 hours   Type of Home Other (Comment)  [place] One level   Bathroom Shower/Tub Tub/Shower unit   Additional Comments transfer tub bench, hand held shower, grab bars, grab bars around toilet, raised toilet seat with handles.      Prior Function   Level of Independence Independent   Vocation Retired   [employer] was a Journalist, newspaper, Clinical cytogeneticist, having friends over, talking on the phone     ADL   Eating/Feeding Minimal assistance  set up   Grooming Independent   Upper Body Bathing Minimal assistance  for back    Lower Body Bathing Supervision/safety  cues for safety   Upper Body Dressing Independent   Lower Body [place]  at night- balance worse at night, cues for safety   [place] Transfer Supervision/safety     IADL   Shopping Completely unable to shop   Light Housekeeping Performs light daily tasks such as dishwashing, bed making   Meal Prep Needs to have meals prepared and served   [employer] on family or  friends for transportation  was driving short distances prior to CVA   Medication Management Is responsible for taking medication in correct dosages at correct time   Physiological scientist financial matters independently (budgets, writes checks, pays rent, bills goes to bank), collects and keeps track of income     Mobility   Mobility Status Needs assist   Mobility Status Comments supervision in the community and on stairs     Written Expression   Dominant Hand Right     Vision - History   Baseline Vision Wears glasses all the time   Additional Comments Pt denies any visual changes.  Denies any diplopia at any time.     Activity Tolerance   Activity Tolerance Tolerates 30 min activity with muliple rests     Cognition   Area of Impairment  Attention;Memory;Safety/judgement   Memory Decreased short-term memory   Problem Solving Slow processing;Decreased initiation   Awareness Impaired   Awareness Impairment Emergent impairment   Problem Solving Impaired   Executive Function Reasoning;Decision Making;Initiating     Sensation   Light Touch Appears Intact   Hot/Cold Appears Intact   Proprioception Appears Intact     Coordination   Gross Motor Movements are Fluid and Coordinated Yes   Fine Motor Movements are Fluid and Coordinated No  very slowed  movement likely due to cognition   9 Hole Peg Test Right;Left   Right 9 Hole Peg Test 32.81   Left 9 Hole Peg Test 41.97   Other increased time on 9 hole peg appears to be due to slowed processing vs true incoordination. Pt and sister in agreement and state this is reflected functionally as well.      Tone   Assessment Location Right Upper Extremity;Left Upper Extremity     ROM / Strength   AROM / PROM / Strength AROM;Strength     AROM   Overall AROM  Within functional limits for tasks performed     Strength   Overall Strength Within functional limits for tasks performed   Overall Strength Comments BUE's     Hand Function   Right Hand Gross Grasp Functional   Right Hand Grip (lbs) 55   Left Hand Gross Grasp Impaired   Left Hand Grip (lbs) 40     RUE Tone   RUE Tone Mild  sister reports this may be from Lithium     LUE Tone   LUE Tone Within Functional Limits                  OT Treatments/Exercises (OP) - 05/27/16 0001      ADLs   Bathing Discussed methods to improve independence with shower transfer.  Patient has tub bench, and sister needs to cue her for each step of transfer.  Discussed giving time, or asking open ended question- now what?  to see if patient would generate reasonable response.  Patient indicates that she currently performs 50% of task- indicated that goal for next session that she perform 75% of task (eg less cueing from sister)     ADL Comments Reviewed all goals with patient and sister. Both are in agreement with goals as written.       Hand Exercises   Theraputty Roll;Grip;Pinch   Theraputty - Roll red   Theraputty - Grip red   Theraputty - Pinch red   Other Hand Exercises Resistive clothespins with left hand in order- patient able to recall correct order- cued herself verbally to recall next color.  OT Long Term Goals - 05/27/16 1116      OT LONG TERM GOAL #1   Title Pt and sister will be mod I with home actvities program - 06/23/2016   Status On-going     OT LONG TERM GOAL #2   Title Pt will demonstrate improved grip strength for L hand by 5 pounds to assist with cutting food and food prep (baseline= 40)   Status On-going     OT LONG TERM GOAL #3   Title Pt will be mod I with shower and night toilet transfers   Status On-going     OT LONG TERM GOAL #4   Title Pt will be mod I with bathing     OT LONG TERM GOAL #5   Title Pt will be mod I with simple hot familiar meal prep   Status On-going               Plan - 05/27/16 1205    Clinical Impression Statement Patient and sister agree to OT goals, and are cautiously excited for increased independence with ADL/IADL.     Rehab Potential Fair   OT Frequency 2x / week   OT Duration 4 weeks   OT Treatment/Interventions Self-care/ADL training;Neuromuscular education;Therapeutic exercise;Functional Mobility Training;Cognitive remediation/compensation;Therapeutic activities;Patient/family education;Balance training   Plan Simple cooking, initiate home activity program, balance/cognition   OT Home Exercise Plan Initiated HEP - resistive putty - red   Consulted and Agree with Plan of Care Patient;Family member/caregiver   Family Member Consulted sister- Debbie      Patient will benefit from skilled therapeutic intervention in order to improve the following deficits and impairments:  Decreased activity tolerance,  Decreased balance, Decreased cognition, Decreased safety awareness, Decreased mobility, Decreased strength, Difficulty walking, Impaired UE functional use, Pain  Visit Diagnosis: Unsteadiness on feet  Frontal lobe and executive function deficit  Muscle weakness (generalized)  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   Mariah Milling 05/27/2016, 12:07 PM  [facility] [address] [place], [place], [address] Phone: [phone]   Fax:  [phone]  Name: November Sypher MRN: [id number] Date of Birth: 11/04/1950

## 2016-05-27 NOTE — Therapy (Signed)
[facility] [address] [place], [place], [address] Phone: [phone]   Fax:  [phone]  Speech Language Pathology Treatment  Patient Details  Name: Bridget Jackson MRN: [id number] Date of Birth: 10-27-1950 Referring Provider: Dr. Earline Mayotte  Encounter Date: 05/27/2016      End of Session - 05/27/16 1103    Visit Number 2   Number of Visits 17   Date for SLP Re-Evaluation 07/24/16   SLP Start Time 67   SLP Stop Time  1104   SLP Time Calculation (min) 44 min   Activity Tolerance Patient tolerated treatment well      No past medical history on file.  No past surgical history on file.  There were no vitals filed for this visit.      Subjective Assessment - 05/27/16 1022    Subjective Sister gave pt her homework to give to SLP.    Patient is accompained by: Family member  sister           SLP Evaluation [facility] - 05/26/16 1700      SLP Visit Information   SLP Received On 05/26/16   Referring Provider Dr. Earline Mayotte   Onset Date 04/18/16   Medical Diagnosis cerebellar CVA     Subjective   Patient/Family Stated Goal "To be able to return to [place] and live alone"     Pain Assessment   Currently in Pain? Yes   Pain Score 8    Pain Location Knee   Pain Orientation Right   Pain Type Chronic pain   Pain Onset 1 to 4 weeks ago  started after the stroke but sister says she has had jt pain prior in her hips.   Pain Frequency Constant   Pain Relieving Factors rest   Effect of Pain on Daily Activities walking     General Information   HPI Pt is a 66 year old female s/p cerebellar CVA on 04/18/2016 with hospitalization and inpt rehab stay.  Pt lives in [place] however has returned to [place] to live temporarily with her sister due to needing 24 hour supervision.  PMH: schizoaffective disorder with chronic lithium use,nephrotic diabetes, CKD Stage III, R hip pain.     Mobility Status Walks independently, receiving PT     Prior Functional Status   Cognitive/Linguistic Baseline Within functional limits   Type of Home Apartment    Lives With Alone   Available Support Family   Vocation Retired     Associate Professor   Overall Cognitive Status Impaired/Different from baseline   Area of Impairment Attention;Memory;Safety/judgement;Awareness;Problem solving   Current Attention Level Selective   Memory Decreased short-term memory   Following Commands Follows one step commands with increased time;Follows multi-step commands inconsistently   Problem Solving Slow processing;Decreased initiation   Awareness Impaired   Awareness Impairment Emergent impairment   Problem Solving Impaired   Problem Solving Impairment Verbal complex;Functional basic   Executive Function Reasoning;Decision Making;Initiating     Oral Motor/Sensory Function   Overall Oral Motor/Sensory Function Appears within functional limits for tasks assessed     Motor Speech   Overall Motor Speech Impaired   Respiration Within functional limits   Phonation Normal   Articulation Impaired   Level of Impairment Conversation   Intelligibility Intelligible   Motor Planning Witnin functional limits   Motor Speech Errors Unaware   Effective Techniques Over-articulate;Pause     Standardized Assessments   Standardized Assessments  Cognitive Linguistic Quick Test  Cognitive Linguistic Quick Test (Ages 18-69)   Attention Mild   Memory Mild   Executive Function Mild   Language Mild   Visuospatial Skills WNL   Severity Rating Total 16   Composite Severity Rating 12.8            ADULT SLP TREATMENT - 05/27/16 1027      General Information   Behavior/Cognition Alert;Cooperative;Pleasant mood     Treatment Provided   Treatment provided Cognitive-Linquistic     Cognitive-Linquistic Treatment   Treatment focused on Cognition   Skilled Treatment SLP talked with pt/sister and assessed  whether or not to educate/review strategies for pill administration and appointment-keeping. Sister states she does not need assistance with medication administration or keeping appointments. SLP targeted organization as well as attention with a divergent naming task (min-mod complex) of naming two new items in a category. With extra time and occasional mod cues pt was successful. SLP then targeted organizaiton by having pt write/rewrite a list of groceries - pt did with rare min A and extra time.     Assessment / Recommendations / Plan   Plan Continue with current plan of care     Progression Toward Goals   Progression toward goals Progressing toward goals          SLP Education - 05/26/16 1710    Education provided Yes   Education Details areas of impairment, goals, cognitive activities to do at home   Person(s) Educated Patient;Other (comment)  sister [name]   Methods Explanation;Handout;Verbal cues   Comprehension Verbalized understanding          SLP Short Term Goals - 05/27/16 1702      SLP SHORT TERM GOAL #1   Title Pt will  alterate attention between 2 simple functional cognitive linguistic tasks with 85% accuracy on each and occasional min A   Time 4   Period Weeks   Status On-going     SLP SHORT TERM GOAL #2   Title Pt will solve mildly complex organanization, reasoning, functional math problems with 85% accuracy and rare min A   Time 4   Period Weeks   Status On-going     SLP SHORT TERM GOAL #3   Title Pt will complete mildly complex naming tasks with 90% accuracy and rare min A   Time 4   Period Weeks   Status On-going     SLP SHORT TERM GOAL #4   Title Pt will utilize external aids to recall appoinments, conversations, lists over 3 sessions with occasional min A   Time 4   Period Weeks   Status On-going          SLP Long Term Goals - 05/27/16 1702      SLP LONG TERM GOAL #1   Title Pt will divide attention between 2 simple cognitive linguistic  tasks with 85% on each and occasional min A   Time 8   Period Weeks   Status On-going     SLP LONG TERM GOAL #2   Title Pt will utilize compensations for word finding episodes with rare min A 80% of opportunities   Time 8   Period Weeks   Status On-going     SLP LONG TERM GOAL #3   Title Pt will identify and self correct errors on cognitive tasks with occasional min A   Time 8   Period Weeks   Status On-going          Plan - 05/27/16 1658  Clinical Impression Statement Cognitive linguistic deficts in attention was observed today, requring cues from SLP for attending to task as well as in emergent awareness with the more complex task today. Auditory/mental processing appeared slower than normal. Skilled ST to maximize cognitive linguistic skills remains necessary for possible return home to live alone.    Speech Therapy Frequency 2x / week   Duration --  8 eweks   Treatment/Interventions Compensatory strategies;Patient/family education;Functional tasks;Cognitive reorganization;Internal/external aids;SLP instruction and feedback;Cueing hierarchy   Potential to Achieve Goals Good   Potential Considerations Ability to learn/carryover information      Patient will benefit from skilled therapeutic intervention in order to improve the following deficits and impairments:   Cognitive communication deficit      G-Codes - 06-13-2016 1726    Functional Assessment Tool Used NOMS   Functional Limitations Attention   Attention Current Status (X5217) At least 20 percent but less than 40 percent impaired, limited or restricted   Attention Goal Status (G7159) At least 1 percent but less than 20 percent impaired, limited or restricted      Problem List There are no active problems to display for this patient.   [facility] ,[place], [place]  05/27/2016, 5:02 PM  [facility] [address] [place], [place], [address] Phone:  [phone]   Fax:  [phone]   Name: Shatana Saxton MRN: [id number] Date of Birth: [birth year]

## 2016-05-27 NOTE — Patient Instructions (Signed)
1. Grip Strengthening (Resistive Putty)   Squeeze putty using thumb and all fingers. Repeat 10 times. Do 1 sessions per day.   Extension (Assistive Putty) "Hot Dog"   Roll putty back and forth, being sure to use all fingertips. Repeat 10 times. Do 1 sessions per day.  Then pinch as below.   Palmar Pinch Strengthening (Resistive Putty)   Pinch putty between thumb and each fingertip in turn after rolling out

## 2016-05-27 NOTE — Patient Instructions (Addendum)
*  Please time walking at home.   ABDUCTION: Standing (Active)     Stand, feet flat. Lift right leg out to side. Complete _1-2__ sets of 10___ repetitions. Perform _1__ sessions per day.  http://gtsc.exer.us/111   EXTENSION: Standing (Active)    Stand, both feet flat. Draw right leg behind body as far as possible. Complete _1-2__ sets of __10_ repetitions. Perform _1__ sessions per day.  http://gtsc.exer.us/77   Copyright  VHI. All rights reserved.  Hip Flexion, Knee Straight    Lift right straight leg forward  Repeat __10__ times per session. Do _1___ sessions per day. Copyright  VHI. All rights reserved.

## 2016-05-27 NOTE — Therapy (Signed)
[facility] [address] [place] [employer] [employer], [place], [address] Phone: [phone]   Fax:  [phone]  Physical Therapy Treatment  Patient Details  Name: Bridget Jackson MRN: [id number] Date of Birth: 1950-06-03 Referring Provider: Earline Mayotte, MD  Encounter Date: 05/27/2016      PT End of Session - 05/27/16 1325    Visit Number 2   Number of Visits 17   Date for PT Re-Evaluation 07/21/16   Authorization Type MCR prim, BCBS sec G Code needed every 10th visit   PT Start Time 1152   PT Stop Time 1240   PT Time Calculation (min) 48 min   Equipment Utilized During Treatment Gait belt   Activity Tolerance Patient limited by lethargy   Behavior During Therapy Flat affect      No past medical history on file.  No past surgical history on file.  There were no vitals filed for this visit.      Subjective Assessment - 05/27/16 1156    Subjective No falls since last visit.  Concerned about right knee pain.  Increased since last visit.  Sister has a walking route in her home and pt tries to do 4 laps at one time.   Pain Score 8    Pain Location Knee   Pain Orientation Right   Pain Descriptors / Indicators Aching   Pain Type Chronic pain   Pain Onset 1 to 4 weeks ago  started after the stroke but sister says she has had jt pain prior in her hips.   Pain Frequency Constant   Aggravating Factors  walking   Pain Relieving Factors sitting                         [facility] Adult PT Treatment/Exercise - 05/27/16 0001      Ambulation/Gait   Ambulation/Gait Yes   Ambulation/Gait Assistance 5: Supervision   Ambulation/Gait Assistance Details 6 MWT = 585ft with rest break x1   Ambulation Distance (Feet) 540 Feet   Assistive device None   Gait Pattern Step-through pattern;Decreased stride length;Decreased arm swing - right;Decreased arm swing - left;Lateral trunk lean to right;Trunk flexed   Ambulation  Surface Level;Indoor   Gait velocity 2.78 ft/sec, no AD.     Knee/Hip Exercises: Standing   Hip Flexion Both;1 set;10 reps;Knee straight  alternate.   Hip ADduction Both;1 set;10 reps  Alternate.   Hip Extension Both;1 set;10 reps  Alternate                PT Education - 05/27/16 1322    Education provided Yes   Education Details Discussed POC to address Right knee pain indirectly with strengthening exercise and walking program.  Initiated HEP.   Person(s) Educated Patient;Other (comment)  sister   Methods Demonstration;Explanation;Tactile cues;Verbal cues;Handout   Comprehension Verbalized understanding;Returned demonstration;Verbal cues required;Need further instruction          PT Short Term Goals - 05/22/16 2025      PT SHORT TERM GOAL #1   Title Pt/sister will be independent with initial HEP in order to indicate improved functional mobility and decreased fall risk. (Target Date: 06/19/16)   Time 4   Period Weeks   Status New     PT SHORT TERM GOAL #2   Title Pt will perform 5TSS in </=15 secs without UE support in order to indicate improved BLE strength.     Time 4   Period Weeks  Status New     PT SHORT TERM GOAL #3   Title Will assess gait speed and write appropriate goal.    Time 4   Period Weeks   Status New     PT SHORT TERM GOAL #4   Title Will assess 6MWT and improve distance by 33' in order to indicate improved functional endurance.     Time 4   Period Weeks   Status New     PT SHORT TERM GOAL #5   Title Pt will improve DGI to 16/24 in order to indicate decreased fall risk.     Time 4   Period Weeks   Status New     Additional Short Term Goals   Additional Short Term Goals Yes     PT SHORT TERM GOAL #6   Title Pt will ambulate over varying indoor surfaces while scanning environment up to 300' w/ LRAD at mod I level in order to indicate safe home negotiation.    Time 4   Period Weeks   Status New           PT Long Term Goals -  05/22/16 2030      PT LONG TERM GOAL #1   Title Pt/sister will be independent with final HEP in order to indicate improved functional mobility and decreased fall risk (Target Date: 07/17/16)   Time 8   Period Weeks   Status New     PT LONG TERM GOAL #2   Title Pt will negotiate 2 steps, then 1 step without rails at S level in alternating fashion in order to indicate safe entry/exit at home.     Time 8   Period Weeks   Status New     PT LONG TERM GOAL #3   Title Pt will ambulate with gait speed >/= 2.00 ft/sec in order to indicate decreased fall risk and improved efficiency of gait.     Time 8   Period Weeks   Status New     PT LONG TERM GOAL #4   Title Pt will improve DGI to >19/24 in order to indicate decreased fall risk.     Time 8   Period Weeks   Status New     PT LONG TERM GOAL #5   Title Pt will improve 6MWT distance by 150' from baseline in order to indicate improved functional endurance.     Time 8   Period Weeks   Status New     Additional Long Term Goals   Additional Long Term Goals Yes     PT LONG TERM GOAL #6   Title Pt will ambulate up to 500' over unlevel paved surfaces w/ LRAD at mod I level in order to indicate return to community activities.     Time 8   Period Weeks   Status New               Plan - 05/27/16 1327    Clinical Impression Statement Pt performed 6MWT with 1 seated rest, no AD, at supervsion level, 526ft.  Pt's gait velocity = 2.78 ft/sec, no AD.  Pt is vey concerned about right knee pain during session and reports that hip pain has gone since she had the stroke; she had PT for hip prior to CVA..  Initiated HEP for standing hip and knee strengthening, mod cues for body mechanics and posture, and discessed walking pogram.  Rehab Potential Good   Clinical Impairments Affecting Rehab Potential see past medical history above   PT Frequency 2x / week   PT Duration 8  weeks   PT Treatment/Interventions ADLs/Self Care Home Management;DME Instruction;Gait training;Stair training;Functional mobility training;Therapeutic activities;Therapeutic exercise;Balance training;Neuromuscular re-education;Cognitive remediation;Patient/family education;Vestibular;Energy conservation   PT Next Visit Plan 6MWT and gait speed: write goal, Review HEP for strength and balance, scanning enviornmnet, head turns with gait   Consulted and Agree with Plan of Care Patient;Family member/caregiver   Family Member Consulted sister, [name]       Patient will benefit from skilled therapeutic intervention in order to improve the following deficits and impairments:  Decreased activity tolerance, Decreased balance, Decreased cognition, Decreased coordination, Decreased endurance, Decreased knowledge of use of DME, Decreased mobility, Decreased strength, Impaired perceived functional ability, Impaired flexibility, Postural dysfunction  Visit Diagnosis: Unsteadiness on feet  Muscle weakness (generalized)  Other abnormalities of gait and mobility     Problem List There are no active problems to display for this patient.  Bjorn Loser, PTA  05/27/16, 1:36 PM [facility] [address] [place], [place], [address] Phone: [phone]   Fax:  [phone]  Name: Bridget Jackson MRN: [id number] Date of Birth: March 30, 1950

## 2016-05-27 NOTE — Patient Instructions (Addendum)
  Please complete the assigned speech therapy homework prior to your next session and return it to the speech therapist at your next visit.  

## 2016-05-29 ENCOUNTER — Ambulatory Visit: Payer: Medicare Other | Admitting: Rehabilitation

## 2016-05-29 ENCOUNTER — Encounter: Payer: Self-pay | Admitting: Rehabilitation

## 2016-05-29 DIAGNOSIS — I63449 Cerebral infarction due to embolism of unspecified cerebellar artery: Secondary | ICD-10-CM | POA: Diagnosis not present

## 2016-05-29 DIAGNOSIS — R41844 Frontal lobe and executive function deficit: Secondary | ICD-10-CM | POA: Diagnosis not present

## 2016-05-29 DIAGNOSIS — R2681 Unsteadiness on feet: Secondary | ICD-10-CM

## 2016-05-29 DIAGNOSIS — R2689 Other abnormalities of gait and mobility: Secondary | ICD-10-CM | POA: Diagnosis not present

## 2016-05-29 DIAGNOSIS — M6281 Muscle weakness (generalized): Secondary | ICD-10-CM | POA: Diagnosis not present

## 2016-05-29 DIAGNOSIS — R41841 Cognitive communication deficit: Secondary | ICD-10-CM | POA: Diagnosis not present

## 2016-05-29 NOTE — Therapy (Signed)
[facility] [address] [place], [place], [address] Phone: [phone]   Fax:  [phone]  Physical Therapy Treatment  Patient Details  Name: Bridget Jackson MRN: [id number] Date of Birth: [birth year] Referring Provider: Earline Mayotte, MD  Encounter Date: 05/29/2016      PT End of Session - 05/29/16 1112    Visit Number 3   Number of Visits 17   Date for PT Re-Evaluation 07/21/16   Authorization Type MCR prim, BCBS sec G Code needed every 10th visit   PT Start Time 1103   PT Stop Time 1145   PT Time Calculation (min) 42 min   Equipment Utilized During Treatment Gait belt   Activity Tolerance Patient limited by lethargy   Behavior During Therapy Flat affect      History reviewed. No pertinent past medical history.  History reviewed. No pertinent surgical history.  There were no vitals filed for this visit.      Subjective Assessment - 05/29/16 1110    Subjective Reports no falls, exercises going well and help to loosen up the knee, but is still hurting.    Patient is accompained by: Family member   Limitations House hold activities;Walking;Standing   Patient Stated Goals "I want to walk better."    Currently in Pain? Yes   Pain Score 7    Pain Location Knee   Pain Orientation Right   Pain Descriptors / Indicators Aching   Pain Type Chronic pain   Pain Onset 1 to 4 weeks ago   Pain Frequency Constant   Aggravating Factors  too much activity    Pain Relieving Factors exercises                         [facility] Adult PT Treatment/Exercise - 05/29/16 0001      Self-Care   Self-Care Other Self-Care Comments   Other Self-Care Comments  Education on doing stretches multiple times per day esp if she is more active to decrease stiffness/pain.       Therapeutic Activites    Therapeutic Activities Other Therapeutic Activities   Other Therapeutic Activities briefly assessed pts knee  pain.  Note decreased ROM with both flex and extension and feel that this is likey due to arthritis and decreased muscle flexibility.  Provided Seated hamstring stretch for pt for HEP.  Performed x 2 reps of 1 min with cues on how to progress.       Neuro Re-ed    Neuro Re-ed Details  Added tandem walking and marching to HEP, see pt instruction for details on exercises and reps performed.  cues for posture and technique.      Exercises   Exercises Other Exercises   Other Exercises  Mini squats within pain free range x 10 reps with BUE support.                  PT Education - 05/29/16 1112    Education provided Yes   Education Details see self care, additions to HEP   Person(s) Educated Patient;Caregiver(s)   Methods Explanation;Demonstration;Handout   Comprehension Verbalized understanding;Returned demonstration          PT Short Term Goals - 05/22/16 2025      PT SHORT TERM GOAL #1   Title Pt/sister will be independent with initial HEP in order to indicate improved functional mobility and decreased fall risk. (Target Date: 06/19/16)   Time 4   Period Weeks  Status New     PT SHORT TERM GOAL #2   Title Pt will perform 5TSS in </=15 secs without UE support in order to indicate improved BLE strength.     Time 4   Period Weeks   Status New     PT SHORT TERM GOAL #3   Title Will assess gait speed and write appropriate goal.    Time 4   Period Weeks   Status New     PT SHORT TERM GOAL #4   Title Will assess 6MWT and improve distance by 9' in order to indicate improved functional endurance.     Time 4   Period Weeks   Status New     PT SHORT TERM GOAL #5   Title Pt will improve DGI to 16/24 in order to indicate decreased fall risk.     Time 4   Period Weeks   Status New     Additional Short Term Goals   Additional Short Term Goals Yes     PT SHORT TERM GOAL #6   Title Pt will ambulate over varying indoor surfaces while scanning environment up to 300' w/  LRAD at mod I level in order to indicate safe home negotiation.    Time 4   Period Weeks   Status New           PT Long Term Goals - 05/22/16 2030      PT LONG TERM GOAL #1   Title Pt/sister will be independent with final HEP in order to indicate improved functional mobility and decreased fall risk (Target Date: 07/17/16)   Time 8   Period Weeks   Status New     PT LONG TERM GOAL #2   Title Pt will negotiate 2 steps, then 1 step without rails at S level in alternating fashion in order to indicate safe entry/exit at home.     Time 8   Period Weeks   Status New     PT LONG TERM GOAL #3   Title Pt will ambulate with gait speed >/= 2.00 ft/sec in order to indicate decreased fall risk and improved efficiency of gait.     Time 8   Period Weeks   Status New     PT LONG TERM GOAL #4   Title Pt will improve DGI to >19/24 in order to indicate decreased fall risk.     Time 8   Period Weeks   Status New     PT LONG TERM GOAL #5   Title Pt will improve 6MWT distance by 150' from baseline in order to indicate improved functional endurance.     Time 8   Period Weeks   Status New     Additional Long Term Goals   Additional Long Term Goals Yes     PT LONG TERM GOAL #6   Title Pt will ambulate up to 500' over unlevel paved surfaces w/ LRAD at mod I level in order to indicate return to community activities.     Time 8   Period Weeks   Status New               Plan - 05/29/16 1440    Clinical Impression Statement Continued with additions to HEP for strength, balance and LE flexibility.  Pt tolerated well with cues for technique and to sister for how to assist/cue.  Note that knee pain improves with stretching and movement, therefore provided education to do these throughout the day.  Rehab Potential Good   Clinical Impairments Affecting Rehab Potential see past medical history above   PT Frequency 2x / week   PT Duration 8 weeks   PT Treatment/Interventions ADLs/Self  Care Home Management;DME Instruction;Gait training;Stair training;Functional mobility training;Therapeutic activities;Therapeutic exercise;Balance training;Neuromuscular re-education;Cognitive remediation;Patient/family education;Vestibular;Energy conservation   PT Next Visit Plan Review HEP for strength and balance as needed, scanning enviornmnet, head turns with gait, improved arm swing with gait   Consulted and Agree with Plan of Care Patient;Family member/caregiver   Family Member Consulted sister, [name]       Patient will benefit from skilled therapeutic intervention in order to improve the following deficits and impairments:  Decreased activity tolerance, Decreased balance, Decreased cognition, Decreased coordination, Decreased endurance, Decreased knowledge of use of DME, Decreased mobility, Decreased strength, Impaired perceived functional ability, Impaired flexibility, Postural dysfunction  Visit Diagnosis: Unsteadiness on feet  Muscle weakness (generalized)  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  Cameron Sprang, PT, MPT [facility] [address] [place], [place], [address] Phone: [phone]   Fax:  [phone] 05/29/16, 2:43 PM  Name: Bridget Jackson MRN: [id number] Date of Birth: [birth year]

## 2016-05-29 NOTE — Patient Instructions (Addendum)
Hamstring Stretch    Sitting with operated leg straight on bed, and foot of other leg on floor, lean forward toward toes of straight leg. Hold __60__ seconds. Repeat _2-3___ times. Do __2-3__ sessions per day.  http://gt2.exer.us/303   Copyright  VHI. All rights reserved.   "I love a Parade" Lift    Stand next to counter top in kitchen or around the dinning table,  March forward slowly to focus on standing on one leg.  When you get to the end of the countertop, I want you to march backwards.  Take big steps and keep your feet apart.   Repeat __1__ lap forwards and 1 lap backwards. Do __1-2__ sessions per day.  http://gt2.exer.us/345   Copyright  VHI. All rights reserved.   Tandem Walking    Walk with each foot directly in front of other, heel of one foot touching toes of other foot with each step. Both feet straight ahead.  Do this around the dinning table forward x 1 lap and backwards x 1 lap.  Repeat 1-2 times per day.     Copyright  VHI. All rights reserved.

## 2016-06-01 ENCOUNTER — Ambulatory Visit: Payer: Medicare Other | Admitting: Physical Therapy

## 2016-06-01 ENCOUNTER — Encounter: Payer: Self-pay | Admitting: Physical Therapy

## 2016-06-01 ENCOUNTER — Ambulatory Visit: Payer: Medicare Other

## 2016-06-01 DIAGNOSIS — M6281 Muscle weakness (generalized): Secondary | ICD-10-CM | POA: Diagnosis not present

## 2016-06-01 DIAGNOSIS — R41844 Frontal lobe and executive function deficit: Secondary | ICD-10-CM | POA: Diagnosis not present

## 2016-06-01 DIAGNOSIS — R2681 Unsteadiness on feet: Secondary | ICD-10-CM

## 2016-06-01 DIAGNOSIS — R41841 Cognitive communication deficit: Secondary | ICD-10-CM | POA: Diagnosis not present

## 2016-06-01 DIAGNOSIS — R2689 Other abnormalities of gait and mobility: Secondary | ICD-10-CM

## 2016-06-01 DIAGNOSIS — I63449 Cerebral infarction due to embolism of unspecified cerebellar artery: Secondary | ICD-10-CM | POA: Diagnosis not present

## 2016-06-01 NOTE — Patient Instructions (Addendum)
06/01/16 Performed HEP as written with min cues   Hamstring Stretch    Sitting with operated leg straight on bed, and foot of other leg on floor, lean forward toward toes of straight leg. Hold __60__ seconds. Repeat _2-3___ times. Do __2-3__ sessions per day.  http://gt2.exer.us/303   Copyright  VHI. All rights reserved.   "I love a Parade" Lift   06/01/16 UE support required    Stand next to counter top in kitchen or around the dinning table,  March forward slowly to focus on standing on one leg.  When you get to the end of the countertop, I want you to march backwards.  Take big steps and keep your feet apart.   Repeat __1__ lap forwards and 1 lap backwards. Do __1-2__ sessions per day.  http://gt2.exer.us/345   Copyright  VHI. All rights reserved.   Tandem Walking    06/01/16 UE support required    Walk with each foot directly in front of other, heel of one foot touching toes of other foot with each step. Both feet straight ahead.  Do this around the dinning table forward x 1 lap and backwards x 1 lap.  Repeat 1-2 times per day.     Copyright  VHI. All rights reserved.       Performed 06/01/16 with min cues and UE support.  ABDUCTION: Standing (Active)     Stand, feet flat. Lift right leg out to side. Complete _1-2__ sets of 10___ repetitions. Perform _1__ sessions per day.  http://gtsc.exer.us/111   EXTENSION: Standing (Active)    Stand, both feet flat. Draw right leg behind body as far as possible. Complete _1-2__ sets of __10_ repetitions. Perform _1__ sessions per day.  http://gtsc.exer.us/77   Copyright  VHI. All rights reserved.  Hip Flexion, Knee Straight    Lift right straight leg forward  Repeat __10__ times per session. Do _1___ sessions per day. Copyright  VHI. All rights reserved.

## 2016-06-01 NOTE — Patient Instructions (Signed)
  Please complete the assigned speech therapy homework prior to your next session and return it to the speech therapist at your next visit.  

## 2016-06-01 NOTE — Therapy (Signed)
[facility] [address] [place], [place], [address] Phone: [phone]   Fax:  [phone]  Speech Language Pathology Treatment  Patient Details  Name: Bridget Jackson MRN: [id number] Date of Birth: 1950-08-28 Referring Provider: Dr. Earline Mayotte  Encounter Date: 06/01/2016      End of Session - 06/01/16 1638    Visit Number 3   Number of Visits 17   Date for SLP Re-Evaluation 07/24/16   SLP Start Time 63   SLP Stop Time  1615   SLP Time Calculation (min) 41 min   Activity Tolerance Patient tolerated treatment well      No past medical history on file.  No past surgical history on file.  There were no vitals filed for this visit.      Subjective Assessment - 06/01/16 1537    Currently in Pain? Yes   Pain Score 7    Pain Location Knee   Pain Orientation Right   Pain Descriptors / Indicators Aching   Pain Type Chronic pain   Pain Onset Today   Pain Frequency Constant               ADULT SLP TREATMENT - 06/01/16 1539      General Information   Behavior/Cognition Alert;Cooperative;Pleasant mood     Treatment Provided   Treatment provided Cognitive-Linquistic     Cognitive-Linquistic Treatment   Treatment focused on Cognition   Skilled Treatment Pt recalled going to farmer's market on Saturday and getting tomatoes. SLP had to ask pt for homework. SLP corrected pt homework with her (addiing another item to a category) and appreciated deficits in simple reasoning requiring min-mod A consistently in incorrect responses. IDing errors req'd total A consistently.     Assessment / Recommendations / Plan   Plan Continue with current plan of care     Progression Toward Goals   Progression toward goals Progressing toward goals            SLP Short Term Goals - 06/01/16 1639      SLP SHORT TERM GOAL #1   Title Pt will  alterate attention between 2 simple functional cognitive  linguistic tasks with 85% accuracy on each and occasional min A   Time 3   Period Weeks   Status On-going     SLP SHORT TERM GOAL #2   Title Pt will solve mildly complex organanization, reasoning, functional math problems with 85% accuracy and rare min A   Time 3   Period Weeks   Status On-going     SLP SHORT TERM GOAL #3   Title Pt will complete mildly complex naming tasks with 90% accuracy and rare min A   Time 3   Period Weeks   Status On-going     SLP SHORT TERM GOAL #4   Title Pt will utilize external aids to recall appoinments, conversations, lists over 3 sessions with occasional min A   Time 3   Period Weeks   Status On-going          SLP Long Term Goals - 06/01/16 1640      SLP LONG TERM GOAL #1   Title Pt will divide attention between 2 simple cognitive linguistic tasks with 85% on each and occasional min A   Time 7   Period Weeks   Status On-going     SLP LONG TERM GOAL #2   Title Pt will utilize compensations for word finding episodes with rare min A  80% of opportunities   Time 7   Period Weeks   Status On-going     SLP LONG TERM GOAL #3   Title Pt will identify and self correct errors on cognitive tasks with occasional min A   Time 7   Period Weeks   Status On-going          Plan - 06/01/16 1639    Clinical Impression Statement Cognitive linguistic deficts in awareness and simple reasoning wre observed today. Auditory/mental processing again appeared slower than normal. Skilled ST to maximize cognitive linguistic skills remains necessary for possible return home to live alone.    Speech Therapy Frequency 2x / week   Duration --  8 eweks   Treatment/Interventions Compensatory strategies;Patient/family education;Functional tasks;Cognitive reorganization;Internal/external aids;SLP instruction and feedback;Cueing hierarchy   Potential to Achieve Goals Good   Potential Considerations Ability to learn/carryover information      Patient will benefit  from skilled therapeutic intervention in order to improve the following deficits and impairments:   Cognitive communication deficit    Problem List There are no active problems to display for this patient.   [facility] ,[place], [place]  06/01/2016, 4:41 PM  [facility] [address] [place], [place], [address] Phone: [phone]   Fax:  [phone]   Name: Angelik Walls MRN: [id number] Date of Birth: 11/23/1950

## 2016-06-01 NOTE — Therapy (Signed)
[facility] [address] [place], [place], [address] Phone: [phone]   Fax:  [phone]  Physical Therapy Treatment  Patient Details  Name: Bridget Jackson MRN: [id number] Date of Birth: [birth year] Referring Provider: Earline Mayotte, MD  Encounter Date: 06/01/2016      PT End of Session - 06/01/16 1510    Visit Number 4   Number of Visits 17   Date for PT Re-Evaluation 07/21/16   Authorization Type MCR prim, BCBS sec G Code needed every 10th visit   PT Start Time 1450   PT Stop Time 1530   PT Time Calculation (min) 40 min   Equipment Utilized During Treatment Gait belt   Activity Tolerance Patient limited by lethargy   Behavior During Therapy Flat affect      History reviewed. No pertinent past medical history.  History reviewed. No pertinent surgical history.  There were no vitals filed for this visit.      Subjective Assessment - 06/01/16 1450    Subjective Exercises are going well. Right knee is getting better. Walked in from the parking lot.   Patient is accompained by: Family member   Limitations House hold activities;Walking;Standing   Patient Stated Goals "I want to walk better."    Currently in Pain? Yes   Pain Score 7    Pain Location Knee   Pain Orientation Right   Pain Descriptors / Indicators Aching   Pain Type Chronic pain   Pain Onset 1 to 4 weeks ago   Pain Frequency Constant        06/01/16 Performed HEP as written with min cues   Hamstring Stretch    Sitting with operated leg straight on bed, and foot of other leg on floor, lean forward toward toes of straight leg. Hold __60__ seconds. Repeat _2-3___ times. Do __2-3__ sessions per day.  http://gt2.exer.us/303   Copyright  VHI. All rights reserved.   "I love a Parade" Lift   06/01/16 UE support required    Stand next to counter top in kitchen or around the dinning table,  March forward slowly to focus on  standing on one leg.  When you get to the end of the countertop, I want you to march backwards.  Take big steps and keep your feet apart.   Repeat __1__ lap forwards and 1 lap backwards. Do __1-2__ sessions per day.  http://gt2.exer.us/345   Copyright  VHI. All rights reserved.   Tandem Walking    06/01/16 UE support required    Walk with each foot directly in front of other, heel of one foot touching toes of other foot with each step. Both feet straight ahead.  Do this around the dinning table forward x 1 lap and backwards x 1 lap.  Repeat 1-2 times per day.     Copyright  VHI. All rights reserved.       Performed exercises below 06/01/16 with min cues and UE support.  ABDUCTION: Standing (Active)     Stand, feet flat. Lift right leg out to side. Complete _1-2__ sets of 10___ repetitions. Perform _1__ sessions per day.  http://gtsc.exer.us/111   EXTENSION: Standing (Active)    Stand, both feet flat. Draw right leg behind body as far as possible. Complete _1-2__ sets of __10_ repetitions. Perform _1__ sessions per day.  http://gtsc.exer.us/77   Copyright  VHI. All rights reserved.  Hip Flexion, Knee Straight    Lift right straight leg forward  Repeat __10__ times per session. Do  _1___ sessions per day. Copyright  VHI. All rights reserved.                    [place] Adult PT Treatment/Exercise - 06/01/16 0001      Ambulation/Gait   Ambulation/Gait Yes   Ambulation/Gait Assistance 5: Supervision   Ambulation/Gait Assistance Details working on initial heel strike, balance with visual scanning technique,  and activity tolerance.                         Ambulation Distance (Feet) 400 Feet  x2   Assistive device None   Gait Pattern Step-through pattern;Decreased stride length;Decreased arm swing - right;Decreased arm swing - left;Lateral trunk lean to right;Trunk flexed   Ambulation Surface Level;Indoor        Funtional sit<>stands with UE support  working on Economist and balance, x5 supervision level.        PT Education - 06/01/16 1509    Education provided Yes   Education Details Performed/ reviewed HEP.   Person(s) Educated Patient   Methods Explanation;Demonstration;Verbal cues;Handout   Comprehension Verbalized understanding;Returned demonstration;Verbal cues required          PT Short Term Goals - 05/22/16 2025      PT SHORT TERM GOAL #1   Title Pt/sister will be independent with initial HEP in order to indicate improved functional mobility and decreased fall risk. (Target Date: 06/19/16)   Time 4   Period Weeks   Status New     PT SHORT TERM GOAL #2   Title Pt will perform 5TSS in </=15 secs without UE support in order to indicate improved BLE strength.     Time 4   Period Weeks   Status New     PT SHORT TERM GOAL #3   Title Will assess gait speed and write appropriate goal.    Time 4   Period Weeks   Status New     PT SHORT TERM GOAL #4   Title Will assess 6MWT and improve distance by 69' in order to indicate improved functional endurance.     Time 4   Period Weeks   Status New     PT SHORT TERM GOAL #5   Title Pt will improve DGI to 16/24 in order to indicate decreased fall risk.     Time 4   Period Weeks   Status New     Additional Short Term Goals   Additional Short Term Goals Yes     PT SHORT TERM GOAL #6   Title Pt will ambulate over varying indoor surfaces while scanning environment up to 300' w/ LRAD at mod I level in order to indicate safe home negotiation.    Time 4   Period Weeks   Status New           PT Long Term Goals - 05/22/16 2030      PT LONG TERM GOAL #1   Title Pt/sister will be independent with final HEP in order to indicate improved functional mobility and decreased fall risk (Target Date: 07/17/16)   Time 8   Period Weeks   Status New     PT LONG TERM GOAL #2   Title Pt will negotiate 2 steps, then 1 step without rails at S level in alternating fashion in  order to indicate safe entry/exit at home.     Time 8   Period Weeks   Status New  PT LONG TERM GOAL #3   Title Pt will ambulate with gait speed >/= 2.00 ft/sec in order to indicate decreased fall risk and improved efficiency of gait.     Time 8   Period Weeks   Status New     PT LONG TERM GOAL #4   Title Pt will improve DGI to >19/24 in order to indicate decreased fall risk.     Time 8   Period Weeks   Status New     PT LONG TERM GOAL #5   Title Pt will improve 6MWT distance by 150' from baseline in order to indicate improved functional endurance.     Time 8   Period Weeks   Status New     Additional Long Term Goals   Additional Long Term Goals Yes     PT LONG TERM GOAL #6   Title Pt will ambulate up to 500' over unlevel paved surfaces w/ LRAD at mod I level in order to indicate return to community activities.     Time 8   Period Weeks   Status New               Plan - 06/01/16 1517    Clinical Impression Statement Worked on increasing walking tolerance working on gait mechanics and balance; pt reported no increase in knee pain.  Pt able to perform HEPs for  balance and LE strengthening with min cues and UE support.   Rehab Potential Good   Clinical Impairments Affecting Rehab Potential see past medical history above   PT Frequency 2x / week   PT Duration 8 weeks   PT Treatment/Interventions ADLs/Self Care Home Management;DME Instruction;Gait training;Stair training;Functional mobility training;Therapeutic activities;Therapeutic exercise;Balance training;Neuromuscular re-education;Cognitive remediation;Patient/family education;Vestibular;Energy conservation   PT Next Visit Plan --   Consulted and Agree with Plan of Care Patient;Family member/caregiver   Family Member Consulted sister, [name]       Patient will benefit from skilled therapeutic intervention in order to improve the following deficits and impairments:  Decreased activity tolerance, Decreased  balance, Decreased cognition, Decreased coordination, Decreased endurance, Decreased knowledge of use of DME, Decreased mobility, Decreased strength, Impaired perceived functional ability, Impaired flexibility, Postural dysfunction  Visit Diagnosis: Unsteadiness on feet  Muscle weakness (generalized)  Other abnormalities of gait and mobility     Problem List There are no active problems to display for this patient.  Bjorn Loser, PTA  06/01/16, 3:40 PM [facility] [address] [place], [place], [address] Phone: [phone]   Fax:  [phone]  Name: Bridget Jackson MRN: [id number] Date of Birth: [birth year]

## 2016-06-02 ENCOUNTER — Ambulatory Visit: Payer: Medicare Other | Attending: Family Medicine | Admitting: Occupational Therapy

## 2016-06-02 ENCOUNTER — Encounter: Payer: Self-pay | Admitting: Physical Therapy

## 2016-06-02 ENCOUNTER — Ambulatory Visit: Payer: Medicare Other | Admitting: Speech Pathology

## 2016-06-02 ENCOUNTER — Encounter: Payer: Self-pay | Admitting: Occupational Therapy

## 2016-06-02 ENCOUNTER — Ambulatory Visit: Payer: Medicare Other | Admitting: Physical Therapy

## 2016-06-02 DIAGNOSIS — R41844 Frontal lobe and executive function deficit: Secondary | ICD-10-CM | POA: Diagnosis not present

## 2016-06-02 DIAGNOSIS — R2689 Other abnormalities of gait and mobility: Secondary | ICD-10-CM

## 2016-06-02 DIAGNOSIS — R41841 Cognitive communication deficit: Secondary | ICD-10-CM | POA: Insufficient documentation

## 2016-06-02 DIAGNOSIS — R2681 Unsteadiness on feet: Secondary | ICD-10-CM

## 2016-06-02 DIAGNOSIS — M6281 Muscle weakness (generalized): Secondary | ICD-10-CM | POA: Diagnosis not present

## 2016-06-02 NOTE — Therapy (Signed)
[facility] [address] [place], [place], [address] Phone: [phone]   Fax:  [phone]  Speech Language Pathology Treatment  Patient Details  Name: Bridget Jackson MRN: [id number] Date of Birth: 1950/05/14 Referring Provider: Dr. Earline Mayotte  Encounter Date: 06/02/2016      End of Session - 06/02/16 1150    Visit Number 4   Number of Visits 17   Date for SLP Re-Evaluation 07/24/16   SLP Start Time 7001   SLP Stop Time  1145   SLP Time Calculation (min) 43 min   Activity Tolerance Patient tolerated treatment well      No past medical history on file.  No past surgical history on file.  There were no vitals filed for this visit.      Subjective Assessment - 06/02/16 1104    Subjective "I did the homework - my sister helped me with one."               ADULT SLP TREATMENT - 06/02/16 1105      General Information   Behavior/Cognition Alert;Cooperative;Pleasant mood     Treatment Provided   Treatment provided Cognitive-Linquistic     Cognitive-Linquistic Treatment   Treatment focused on Cognition   Skilled Treatment Facilitated abstract reasoning generating relationships of objects on pictures with occasional min cues for logical reasoning.  Attention to detail and alternating attention facilitated organizing chart of kitchen and closet shelve according to descriptions with rare min A and extended time. Divergent naming 10/10 with rare min A; 6/10 with usual min semantic cues.      Assessment / Recommendations / Plan   Plan Continue with current plan of care     Progression Toward Goals   Progression toward goals Progressing toward goals            SLP Short Term Goals - 06/02/16 1149      SLP SHORT TERM GOAL #1   Title Pt will  alterate attention between 2 simple functional cognitive linguistic tasks with 85% accuracy on each and occasional min A   Time 3   Period Weeks    Status On-going     SLP SHORT TERM GOAL #2   Title Pt will solve mildly complex organanization, reasoning, functional math problems with 85% accuracy and rare min A   Time 3   Period Weeks   Status On-going     SLP SHORT TERM GOAL #3   Title Pt will complete mildly complex naming tasks with 90% accuracy and rare min A   Time 3   Period Weeks   Status On-going     SLP SHORT TERM GOAL #4   Title Pt will utilize external aids to recall appoinments, conversations, lists over 3 sessions with occasional min A   Time 3   Period Weeks   Status On-going          SLP Long Term Goals - 06/02/16 1149      SLP LONG TERM GOAL #1   Title Pt will divide attention between 2 simple cognitive linguistic tasks with 85% on each and occasional min A   Time 7   Status On-going     SLP LONG TERM GOAL #2   Title Pt will utilize compensations for word finding episodes with rare min A 80% of opportunities   Period Weeks   Status On-going     SLP LONG TERM GOAL #3   Title Pt will identify and self correct  errors on cognitive tasks with occasional min A   Time 7   Period Weeks   Status On-going          Plan - 06/02/16 1149    Clinical Impression Statement Cognitive linguistic deficts in awareness and simple reasoning wre observed today. Auditory/mental processing again appeared slower than normal. Skilled ST to maximize cognitive linguistic skills remains necessary for possible return home to live alone.    Speech Therapy Frequency 2x / week   Treatment/Interventions Compensatory strategies;Patient/family education;Functional tasks;Cognitive reorganization;Internal/external aids;SLP instruction and feedback;Cueing hierarchy   Potential to Achieve Goals Good   Potential Considerations Ability to learn/carryover information      Patient will benefit from skilled therapeutic intervention in order to improve the following deficits and impairments:   Cognitive communication  deficit    Problem List There are no active problems to display for this patient.   Annina Piotrowski, Annye Rusk MS, CCC-SLP 06/02/2016, 11:52 AM  [facility] [address] [place], [place], [address] Phone: [phone]   Fax:  [phone]   Name: Bridget Jackson MRN: [id number] Date of Birth: 25-Dec-1950

## 2016-06-02 NOTE — Therapy (Signed)
[facility] [address] [place], [place], [address] Phone: [phone]   Fax:  [phone]  Occupational Therapy Treatment  Patient Details  Name: Zarea Diesing MRN: [id number] Date of Birth: 11/01/1950 Referring Provider: Dr. Earline Mayotte  Encounter Date: 06/02/2016      OT End of Session - 06/02/16 1254    Visit Number 3   Number of Visits 8   Date for OT Re-Evaluation 06/23/16   Authorization Type medicare with BCBS as secondary will need PN and G code every 10th visit   Authorization Time Period 60 days   Authorization - Visit Number 3   Authorization - Number of Visits 10   OT Start Time 1607   OT Stop Time 1231   OT Time Calculation (min) 44 min   Activity Tolerance Patient tolerated treatment well      History reviewed. No pertinent past medical history.  History reviewed. No pertinent surgical history.  There were no vitals filed for this visit.      Subjective Assessment - 06/02/16 1145    Currently in Pain? Yes   Pain Score 6    Pain Location Knee   Pain Orientation Right   Pain Descriptors / Indicators Aching   Pain Type Chronic pain   Pain Onset More than a month ago   Pain Frequency Intermittent   Aggravating Factors  too much activity   Pain Relieving Factors exercises   Effect of Pain on Daily Activities walking                      OT Treatments/Exercises (OP) - 06/02/16 0001      ADLs   Functional Mobility Practiced tub bench transfers as pt reports that her sister still has to cue her on how to get in and out of tub. Pt able to verbalize steps.  Then provided opportunity for procedural memory . Pt needed only one cue to scoot all the way onto bench to allow for shower curtain. Gave pt visual guide so she would know when she had scooted in far enough.  Second trial pt did not need any cues at all but does need extra time due to slow processing and slowed  motor movements.  Also addressed functional ambulation outside on sidewalk, with head turns, stops and starts and on uneven surface (grass). Pt with increased unsteadiness with head turns to the right as well as uneven surfaces.  This activity also addressed activity tolerance.  Incorporated cold bev prep into activity - pt able to get water and carry across gym with supervision. Pt did slow walking pace to accomodate activity.  Pt tolerated approximately 15 minutes of amubulatory activity without rest break.       Exercises   Exercises Hand     Hand Exercises   Theraputty - Locate Pegs 12 pegs in red putty using just left hand.  Pt required significantly increased time and cueing to just use left hand..  Pt required assist to find last three pegs.     Hand Gripper with Small Beads Gripper on #1 to pick up 1 inch blocks. Pt with min dropping and required multiple rest breaks.                      OT Long Term Goals - 06/02/16 1252      OT LONG TERM GOAL #1   Title Pt and sister will be mod I with home  actvities program - 06/23/2016   Status On-going     OT LONG TERM GOAL #2   Title Pt will demonstrate improved grip strength for L hand by 5 pounds to assist with cutting food and food prep (baseline= 40)   Status On-going     OT LONG TERM GOAL #3   Title Pt will be mod I with shower and night toilet transfers   Status On-going     OT LONG TERM GOAL #4   Title Pt will be mod I with bathing   Status On-going     OT LONG TERM GOAL #5   Title Pt will be mod I with simple hot familiar meal prep   Status On-going               Plan - 06/02/16 1252    Clinical Impression Statement Pt progressing toward goals. Pt reports that she washed her own hair and did not need any physical help to bathe at shower level yesterday. Sister reports progress as well.    Rehab Potential Fair   OT Frequency 2x / week   OT Duration 4 weeks   OT Treatment/Interventions Self-care/ADL  training;Neuromuscular education;Therapeutic exercise;Functional Mobility Training;Cognitive remediation/compensation;Therapeutic activities;Patient/family education;Balance training   Plan simple cooking, intiate home activity program if sister available, balance/cognition.    Consulted and Agree with Plan of Care Patient      Patient will benefit from skilled therapeutic intervention in order to improve the following deficits and impairments:  Decreased activity tolerance, Decreased balance, Decreased cognition, Decreased safety awareness, Decreased mobility, Decreased strength, Difficulty walking, Impaired UE functional use, Pain  Visit Diagnosis: Unsteadiness on feet  Muscle weakness (generalized)  Frontal lobe and executive function deficit    Problem List There are no active problems to display for this patient.   Quay Burow, OTR/L 06/02/2016, 12:55 PM  [facility] [address] [place], [place], [address] Phone: [phone]   Fax:  [phone]  Name: Lakenzie Mcclafferty MRN: [id number] Date of Birth: January 22, 1951

## 2016-06-02 NOTE — Therapy (Signed)
[facility] [address] [place], [place], [address] Phone: [phone]   Fax:  [phone]  Physical Therapy Treatment  Patient Details  Name: Bridget Jackson MRN: [id number] Date of Birth: 1950-05-16 Referring Provider: Earline Mayotte, MD  Encounter Date: 06/02/2016      PT End of Session - 06/02/16 1408    Visit Number 5   Number of Visits 17   Date for PT Re-Evaluation 07/21/16   Authorization Type MCR prim, BCBS sec G Code needed every 10th visit   PT Start Time 1404   PT Stop Time 1445   PT Time Calculation (min) 41 min   Equipment Utilized During Treatment Gait belt   Activity Tolerance Patient limited by lethargy   Behavior During Therapy Flat affect      History reviewed. No pertinent past medical history.  History reviewed. No pertinent surgical history.  There were no vitals filed for this visit.      Subjective Assessment - 06/02/16 1406    Subjective No new complaints. No falls to report. Right knee is better today than it was yesterday.    Patient is accompained by: Family member   Limitations House hold activities;Walking;Standing   Patient Stated Goals "I want to walk better."    Currently in Pain? Yes   Pain Score 6    Pain Location Knee   Pain Orientation Right   Pain Descriptors / Indicators Aching;Sore   Pain Type Chronic pain   Pain Onset More than a month ago   Pain Frequency Intermittent   Aggravating Factors  increased activity   Pain Relieving Factors exercises, stretching            [facility] Adult PT Treatment/Exercise - 06/02/16 1408      Transfers   Transfers Sit to Stand;Stand to Sit   Sit to Stand 5: Supervision;From bed;From chair/3-in-1   Stand to Sit 5: Supervision;To elevated surface;To bed     Ambulation/Gait   Ambulation/Gait Yes   Ambulation/Gait Assistance 4: Min guard   Ambulation/Gait Assistance Details min guard assist on unlevel/complaint  surfaces, had pt work on enviromental scanning (car color's, counting window, signs and trees/bushes) while outdoors as well needing min guard assist   Ambulation Distance (Feet) 500 Feet   Assistive device None   Gait Pattern Step-through pattern;Decreased stride length;Decreased arm swing - right;Decreased arm swing - left;Lateral trunk lean to right;Trunk flexed   Ambulation Surface Level;Indoor;Unlevel;Outdoor;Paved;Gravel;Grass     High Level Balance   High Level Balance Activities Marching forwards;Marching backwards  tandem fwd/bwd, toe walk fwd/bwd, heel walk fwd/bwd   High Level Balance Comments on red mats next to counter top with single UE support: performed each one 3 laps with min guard to min assist for balance.     Knee/Hip Exercises: Stretches   Active Hamstring Stretch Right;3 reps;30 seconds;Limitations   Active Hamstring Stretch Limitations seated edge of mat: cues on form and technique           Balance Exercises - 06/02/16 1441      Balance Exercises: Standing   SLS with Vectors Solid surface;Other reps (comment);Limitations     Balance Exercises: Standing   SLS with Vectors Limitations 2 tall cones on floor: alternating fwd toe taps x 10 reps each leg, alternating cross toe taps x 10 reps each leg. min guard to min assist with cues on posture and weight shifting to assist with balance.  PT Education - 06/01/16 1509    Education provided Yes   Education Details Performed/ reviewed HEP.   Person(s) Educated Patient   Methods Explanation;Demonstration;Verbal cues;Handout   Comprehension Verbalized understanding;Returned demonstration;Verbal cues required          PT Short Term Goals - 05/22/16 2025      PT SHORT TERM GOAL #1   Title Pt/sister will be independent with initial HEP in order to indicate improved functional mobility and decreased fall risk. (Target Date: 06/19/16)   Time 4   Period Weeks   Status New     PT SHORT TERM GOAL #2    Title Pt will perform 5TSS in </=15 secs without UE support in order to indicate improved BLE strength.     Time 4   Period Weeks   Status New     PT SHORT TERM GOAL #3   Title Will assess gait speed and write appropriate goal.    Time 4   Period Weeks   Status New     PT SHORT TERM GOAL #4   Title Will assess 6MWT and improve distance by 57' in order to indicate improved functional endurance.     Time 4   Period Weeks   Status New     PT SHORT TERM GOAL #5   Title Pt will improve DGI to 16/24 in order to indicate decreased fall risk.     Time 4   Period Weeks   Status New     Additional Short Term Goals   Additional Short Term Goals Yes     PT SHORT TERM GOAL #6   Title Pt will ambulate over varying indoor surfaces while scanning environment up to 300' w/ LRAD at mod I level in order to indicate safe home negotiation.    Time 4   Period Weeks   Status New           PT Long Term Goals - 05/22/16 2030      PT LONG TERM GOAL #1   Title Pt/sister will be independent with final HEP in order to indicate improved functional mobility and decreased fall risk (Target Date: 07/17/16)   Time 8   Period Weeks   Status New     PT LONG TERM GOAL #2   Title Pt will negotiate 2 steps, then 1 step without rails at S level in alternating fashion in order to indicate safe entry/exit at home.     Time 8   Period Weeks   Status New     PT LONG TERM GOAL #3   Title Pt will ambulate with gait speed >/= 2.00 ft/sec in order to indicate decreased fall risk and improved efficiency of gait.     Time 8   Period Weeks   Status New     PT LONG TERM GOAL #4   Title Pt will improve DGI to >19/24 in order to indicate decreased fall risk.     Time 8   Period Weeks   Status New     PT LONG TERM GOAL #5   Title Pt will improve 6MWT distance by 150' from baseline in order to indicate improved functional endurance.     Time 8   Period Weeks   Status New     Additional Long Term Goals    Additional Long Term Goals Yes     PT LONG TERM GOAL #6   Title Pt will ambulate up to 500' over unlevel paved surfaces  w/ LRAD at mod I level in order to indicate return to community activities.     Time 8   Period Weeks   Status New           Plan - 06/02/16 1408    Clinical Impression Statement Today's skilled session continued to address dynamic gait and high level balance with no issues reported. Pt is making steady progress toward goals and should benefit from continued PT to progress toward unmet goals.    Rehab Potential Good   Clinical Impairments Affecting Rehab Potential see past medical history above   PT Frequency 2x / week   PT Duration 8 weeks   PT Treatment/Interventions ADLs/Self Care Home Management;DME Instruction;Gait training;Stair training;Functional mobility training;Therapeutic activities;Therapeutic exercise;Balance training;Neuromuscular re-education;Cognitive remediation;Patient/family education;Vestibular;Energy conservation   PT Next Visit Plan gait with head turns/enviromental scanning, balance on complaint surfaces, balance with single leg stance emphasis   Consulted and Agree with Plan of Care Patient;Family member/caregiver   Family Member Consulted sister, [name]       Patient will benefit from skilled therapeutic intervention in order to improve the following deficits and impairments:  Decreased activity tolerance, Decreased balance, Decreased cognition, Decreased coordination, Decreased endurance, Decreased knowledge of use of DME, Decreased mobility, Decreased strength, Impaired perceived functional ability, Impaired flexibility, Postural dysfunction  Visit Diagnosis: Unsteadiness on feet  Muscle weakness (generalized)  Other abnormalities of gait and mobility     Problem List There are no active problems to display for this patient.   Willow Ora, PTA, [facility] [address], [place], [place]  [address] [phone] 06/02/16, 10:13 PM   Name: Bridget Jackson MRN: [id number] Date of Birth: [birth year]

## 2016-06-03 ENCOUNTER — Ambulatory Visit: Payer: Medicare Other | Admitting: Physical Therapy

## 2016-06-04 ENCOUNTER — Ambulatory Visit: Payer: Medicare Other | Admitting: Occupational Therapy

## 2016-06-04 DIAGNOSIS — R41844 Frontal lobe and executive function deficit: Secondary | ICD-10-CM | POA: Diagnosis not present

## 2016-06-04 DIAGNOSIS — R2681 Unsteadiness on feet: Secondary | ICD-10-CM | POA: Diagnosis not present

## 2016-06-04 DIAGNOSIS — M6281 Muscle weakness (generalized): Secondary | ICD-10-CM | POA: Diagnosis not present

## 2016-06-04 DIAGNOSIS — R2689 Other abnormalities of gait and mobility: Secondary | ICD-10-CM | POA: Diagnosis not present

## 2016-06-04 DIAGNOSIS — R41841 Cognitive communication deficit: Secondary | ICD-10-CM | POA: Diagnosis not present

## 2016-06-04 NOTE — Therapy (Signed)
[facility] [address] [place], [place], [address] Phone: [phone]   Fax:  [phone]  Occupational Therapy Treatment  Patient Details  Name: Bridget Jackson MRN: [id number] Date of Birth: 11-Oct-1950 Referring Provider: Dr. Earline Mayotte  Encounter Date: 06/04/2016      OT End of Session - 06/04/16 1307    Visit Number 4   Number of Visits 8   Date for OT Re-Evaluation 06/23/16   Authorization Type medicare with BCBS as secondary will need PN and G code every 10th visit   Authorization Time Period 60 days   Authorization - Visit Number 4   Authorization - Number of Visits 10   OT Start Time 0800   OT Stop Time 0845   OT Time Calculation (min) 45 min   Activity Tolerance Patient tolerated treatment well      No past medical history on file.  No past surgical history on file.  There were no vitals filed for this visit.      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 Yes   Pain Score 5    Pain Location Knee   Pain Orientation Right   Pain Type Chronic pain   Pain Onset More than a month ago   Pain Frequency Intermittent   Aggravating Factors  incr. activity   Pain Relieving Factors exercises, stretching                      OT Treatments/Exercises (OP) - 06/04/16 0001      ADLs   Cooking Pt made scrambled eggs: Pt able to perform entire task at mod I level. Pt able to sequence correctly, plan ahead, adjusted and turned off stove. Pt with slightly increased time to perform task  and she recognized this. She also would second guess herself and look to therapist for reassurance, but was correct. Pt demo problem solving for safety and verbalized different way to perform in prep to returning home alone. Pt w/ self corrected LOB x 1 (stepping backwards to correct)       Functional mobility: Pt asked to ambulate while tossing balloon for balance and divided attention - pt with max difficulty performing both efficiently while simultaneously. Pt kept step length small and balloon close to face. Pt unable to walk backwards and toss balloon simultaneously, pt had to stop walking to toss balloon. However, pt able to walk backwards (while step length was still small) and perform category generation w/ ease for divided attention. Suspect motor planning difficulties for simultaneous physical tasks.                OT Long Term Goals - 06/04/16 1308      OT LONG TERM GOAL #1   Title Pt and sister will be mod I with home actvities program - 06/23/2016   Status On-going     OT LONG TERM GOAL #2   Title Pt will demonstrate improved grip strength for L hand by 5 pounds to assist with cutting food and food prep (baseline= 40)   Status On-going     OT LONG TERM GOAL #3   Title Pt will be mod I with shower and night toilet transfers   Status On-going     OT LONG TERM GOAL #4   Title Pt will be mod I with bathing  Status On-going     OT LONG TERM GOAL #5   Title Pt will be mod I with simple hot familiar meal prep   Status Achieved  met in clinic 06/04/16. Therapist however did recommend distant supervision               Plan - 06/04/16 1308    Clinical Impression Statement Pt progressing towards goals. Pt with increased time to perform cooking task but able to perform safely and independently. Recommended distant sup (sister in house) while performing simple cooking task, but may need more direct sup with complex meal prep.    Rehab Potential Fair   OT Frequency 2x / week   OT Duration 4 weeks   OT Treatment/Interventions Self-care/ADL training;Neuromuscular education;Therapeutic exercise;Functional Mobility Training;Cognitive remediation/compensation;Therapeutic activities;Patient/family education;Balance training   Plan continue balance w/  IADLS, grip strength, cognition   OT Home Exercise Plan Initiated HEP - resistive putty - red   Consulted and Agree with Plan of Care Patient      Patient will benefit from skilled therapeutic intervention in order to improve the following deficits and impairments:  Decreased activity tolerance, Decreased balance, Decreased cognition, Decreased safety awareness, Decreased mobility, Decreased strength, Difficulty walking, Impaired UE functional use, Pain  Visit Diagnosis: Unsteadiness on feet  Frontal lobe and executive function deficit    Problem List There are no active problems to display for this patient.   Carey Bullocks, OTR/L 06/04/2016, 1:15 PM  [facility] [address] [place], [place], [address] Phone: [phone]   Fax:  [phone]  Name: Bridget Jackson MRN: [id number] Date of Birth: Apr 02, 1950

## 2016-06-08 ENCOUNTER — Ambulatory Visit: Payer: Medicare Other | Admitting: Speech Pathology

## 2016-06-08 ENCOUNTER — Ambulatory Visit: Payer: Medicare Other | Admitting: Physical Therapy

## 2016-06-08 ENCOUNTER — Ambulatory Visit: Payer: Medicare Other | Admitting: Occupational Therapy

## 2016-06-08 ENCOUNTER — Encounter: Payer: Self-pay | Admitting: Physical Therapy

## 2016-06-08 DIAGNOSIS — R41841 Cognitive communication deficit: Secondary | ICD-10-CM

## 2016-06-08 DIAGNOSIS — R2689 Other abnormalities of gait and mobility: Secondary | ICD-10-CM | POA: Diagnosis not present

## 2016-06-08 DIAGNOSIS — R41844 Frontal lobe and executive function deficit: Secondary | ICD-10-CM

## 2016-06-08 DIAGNOSIS — M6281 Muscle weakness (generalized): Secondary | ICD-10-CM | POA: Diagnosis not present

## 2016-06-08 DIAGNOSIS — R2681 Unsteadiness on feet: Secondary | ICD-10-CM

## 2016-06-08 NOTE — Therapy (Signed)
[facility] [address] [place], [place], [address] Phone: [phone]   Fax:  [phone]  Speech Language Pathology Treatment  Patient Details  Name: Bridget Jackson MRN: [id number] Date of Birth: 10-25-1950 Referring Provider: Dr. Earline Mayotte  Encounter Date: 06/08/2016      End of Session - 06/08/16 0909    Visit Number 5   Number of Visits 17   Date for SLP Re-Evaluation 07/24/16   SLP Start Time 0849   SLP Stop Time  0930   SLP Time Calculation (min) 41 min   Activity Tolerance Patient tolerated treatment well      No past medical history on file.  No past surgical history on file.  There were no vitals filed for this visit.      Subjective Assessment - 06/08/16 0851    Subjective "All the speech therapists have been new except for Earl. I saw Hedy Camara twice." (meaning Glendell Docker)   Currently in Pain? No/denies               ADULT SLP TREATMENT - 06/08/16 0852      General Information   Behavior/Cognition Alert;Cooperative;Pleasant mood   Patient Positioning Upright in chair   Oral care provided N/A     Treatment Provided   Treatment provided Cognitive-Linquistic     Pain Assessment   Pain Assessment No/denies pain     Cognitive-Linquistic Treatment   Treatment focused on Cognition   Skilled Treatment Simple functional sequencing 100 % accuracy independent with extended time. Facilitated moderately complex sequencing and error awareness, pt with 75% accuracy with extended time and mod cues to identify and correct mistakes. Items in a category naming with extended time, 100% accuracy. Targeted alternating attention between 2 simple cognitive linguistic tasks during calendar activity with 90% accuracy and min cues to return to task after interruptions, min cues to identify and correct errors.     Assessment / Recommendations / Plan   Plan Continue with current plan of care     Progression Toward Goals   Progression toward goals Progressing toward goals            SLP Short Term Goals - 06/08/16 0912      SLP SHORT TERM GOAL #1   Title Pt will  alterate attention between 2 simple functional cognitive linguistic tasks with 85% accuracy on each and occasional min A   Time 2   Period Weeks   Status On-going     SLP SHORT TERM GOAL #2   Title Pt will solve mildly complex organanization, reasoning, functional math problems with 85% accuracy and rare min A   Time 2   Period Weeks   Status On-going     SLP SHORT TERM GOAL #3   Title Pt will complete mildly complex naming tasks with 90% accuracy and rare min A   Time 2   Period Weeks   Status On-going     SLP SHORT TERM GOAL #4   Title Pt will utilize external aids to recall appoinments, conversations, lists over 3 sessions with occasional min A   Time 2   Period Weeks   Status On-going          SLP Long Term Goals - 06/08/16 0934      SLP LONG TERM GOAL #1   Title Pt will divide attention between 2 simple cognitive linguistic tasks with 85% on each and occasional min A   Time 6   Period Weeks  Status On-going     SLP LONG TERM GOAL #2   Title Pt will utilize compensations for word finding episodes with rare min A 80% of opportunities   Time 6   Period Weeks   Status On-going     SLP LONG TERM GOAL #3   Title Pt will identify and self correct errors on cognitive tasks with occasional min A   Time 6   Period Weeks   Status On-going          Plan - 06/08/16 0909    Clinical Impression Statement Cognitive linguistic deficts in awareness and simple reasoning were observed today. Auditory/mental processing again appeared slower than normal. Skilled ST to maximize cognitive linguistic skills remains necessary for possible return home to live alone.    Speech Therapy Frequency 2x / week   Treatment/Interventions Compensatory strategies;Patient/family education;Functional tasks;Cognitive  reorganization;Internal/external aids;SLP instruction and feedback;Cueing hierarchy   Potential to Achieve Goals Good   Potential Considerations Ability to learn/carryover information   Consulted and Agree with Plan of Care Patient      Patient will benefit from skilled therapeutic intervention in order to improve the following deficits and impairments:   Cognitive communication deficit    Problem List There are no active problems to display for this patient.  Deneise Lever, [place], [place] 06/08/2016, 1:06 PM  [facility] [address] [place], [place], [address] Phone: [phone]   Fax:  [phone]   Name: Bridget Jackson MRN: [id number] Date of Birth: [birth year]

## 2016-06-08 NOTE — Therapy (Signed)
[facility] [address] [place], [place], [address] Phone: [phone]   Fax:  [phone]  Occupational Therapy Treatment  Patient Details  Name: Bridget Jackson MRN: [id number] Date of Birth: Mar 11, 1950 Referring Provider: Dr. Earline Mayotte  Encounter Date: 06/08/2016      OT End of Session - 06/08/16 1022    Visit Number 5   Number of Visits 8   Date for OT Re-Evaluation 06/23/16   Authorization Type medicare with BCBS as secondary will need PN and G code every 10th visit   Authorization Time Period 60 days   Authorization - Visit Number 5   Authorization - Number of Visits 10   OT Start Time 1019   OT Stop Time 1100   OT Time Calculation (min) 41 min   Activity Tolerance Patient tolerated treatment well   Behavior During Therapy [facility] for tasks assessed/performed      No past medical history on file.  No past surgical history on file.  There were no vitals filed for this visit.      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 No/denies   Pain Onset More than a month ago       Picking up blocks using gripper set on level 1 (black spring) for sustained grip strength with L hand with min-mod difficulty and 2 rest breaks.  Discussed progress towards goals--see below.  Organizing shopping list into categories with good accuracy, min incr time.  Discussed and had pt plan meals that she could safely make when she returns home.  With min cueing pt able to problem solve ideas.  Recommended pt try simple, familiar stovetop meals  with sister supervision (browning hamburger meat, stovetop pasta--discussed removing pasta from pot to bowl and let water cool prior to emptying).  Pt verbalized understanding.  Gathering items  from various location including floor for visual scanning, functional mobility.  Min cueing to use UE support when possible to pick up items from the floor for incr safety (however, pt performed mod I without UE support).  No LOB.  Also discussed/demonstrated ways to clean up spills from floor for incr safety.  Pt verbalized understanding.  Placing clothespins with 1-6lb resistance on pole for incr L hand strength, unable to perform with black clothespins.                               OT Long Term Goals - 06/08/16 1025      OT LONG TERM GOAL #1   Title Pt and sister will be mod I with home actvities program - 06/23/2016   Status On-going     OT LONG TERM GOAL #2   Title Pt will demonstrate improved grip strength for L hand by 5 pounds to assist with cutting food and food prep (baseline= 40)   Status On-going     OT LONG TERM GOAL #3   Title Pt will be mod I with shower and night toilet transfers   Status On-going  06/08/16  supervision for shower transfers, night toliet transfers mod I per pt (carries call button just in case)     OT LONG TERM GOAL #4   Title Pt will be mod I with bathing  Status Achieved  06/08/16 met per pt report     OT LONG TERM GOAL #5   Title Pt will be mod I with simple hot familiar meal prep   Status Achieved  met in clinic 06/04/16. Therapist however did recommend distant supervision               Plan - 06/08/16 1023    Clinical Impression Statement Pt continues to progress towards goals with incr IADL performance and L hand strength.   Rehab Potential Fair   OT Frequency 2x / week   OT Duration 4 weeks   OT Treatment/Interventions Self-care/ADL training;Neuromuscular education;Therapeutic exercise;Functional Mobility Training;Cognitive remediation/compensation;Therapeutic activities;Patient/family education;Balance training   Plan continue with balance with IADLs, grip strength, cognition/safety   OT Home Exercise Plan  Initiated HEP - resistive putty - red   Consulted and Agree with Plan of Care Patient      Patient will benefit from skilled therapeutic intervention in order to improve the following deficits and impairments:  Decreased activity tolerance, Decreased balance, Decreased cognition, Decreased safety awareness, Decreased mobility, Decreased strength, Difficulty walking, Impaired UE functional use, Pain  Visit Diagnosis: Muscle weakness (generalized)  Unsteadiness on feet  Cognitive communication deficit  Other abnormalities of gait and mobility  Frontal lobe and executive function deficit    Problem List There are no active problems to display for this patient.   [facility] 06/08/2016, 11:23 AM  [facility] [address] [place], [place], [address] Phone: [phone]   Fax:  [phone]  Name: Harika Laidlaw MRN: [id number] Date of Birth: 02/27/1950   Vianne Bulls, OTR/L [facility] [address] [place], [place]  [address] [phone] phone [phone] 06/08/16 11:23 AM

## 2016-06-08 NOTE — Therapy (Signed)
[facility] [address] [place], [place], [address] Phone: [phone]   Fax:  [phone]  Physical Therapy Treatment  Patient Details  Name: Bridget Jackson MRN: [id number] Date of Birth: Dec 11, 1950 Referring Provider: Earline Mayotte, MD  Encounter Date: 06/08/2016      PT End of Session - 06/08/16 1158    Visit Number 6   Number of Visits 17   Date for PT Re-Evaluation 07/21/16   Authorization Type MCR prim, BCBS sec G Code needed every 10th visit   PT Start Time 1104   PT Stop Time 1142   PT Time Calculation (min) 38 min   Equipment Utilized During Treatment Gait belt   Activity Tolerance Patient tolerated treatment well   Behavior During Therapy [facility] for tasks assessed/performed      History reviewed. No pertinent past medical history.  History reviewed. No pertinent surgical history.  There were no vitals filed for this visit.      Subjective Assessment - 06/08/16 1105    Subjective Pt walked about 250' x2 outside to rose garden. Pt worked [employer] and household tasks with sister.  Had a good weekend. R knee is "all right".   Currently in Pain? No/denies   Pain Onset More than a month ago                         [facility] Adult PT Treatment/Exercise - 06/08/16 0001      Ambulation/Gait   Ambulation/Gait Yes   Ambulation/Gait Assistance 4: Min guard   Ambulation/Gait Assistance Details seemed to have increased Right trunk lean.  Training for balance with head turns and outdoor surfaces.  cues for posture and visual scanning.   Ambulation Distance (Feet) 100 Feet  +141ft  outside   Assistive device None   Gait Pattern Step-through pattern;Decreased stride length;Decreased arm swing - right;Decreased arm swing - left;Lateral trunk lean to right;Trunk flexed  right trunk lean   Ambulation Surface Unlevel;Outdoor;Paved;Gravel;Grass;Indoor;Level     Knee/Hip Exercises: Stretches   Other Knee/Hip Stretches Right hip and trunk stretch to address shortening and leaning towards right side: seated edge of mat resting left elbow on mat and guided multi level reaching to extend right side of trunk.   Other Knee/Hip Stretches seated ankle pumps with knee extended to stretch back of LE, x 10 each             Balance Exercises - 06/08/16 1153      Balance Exercises: Standing   Sit to Stand Time sit<>stands without UE support 5x2 cues for body mechanics   Other Standing Exercises Postural awareness/strengthening: standing against door frame with mirror in front for feedback-  scapular squeezes x10, alternate UE raises x5                            PT Short Term Goals - 05/22/16 2025      PT SHORT TERM GOAL #1   Title Pt/sister will be independent with initial HEP in order to indicate improved functional mobility and decreased fall risk. (Target Date: 06/19/16)   Time 4   Period Weeks   Status New     PT SHORT TERM GOAL #2   Title Pt will perform 5TSS in </=15 secs without UE support in order to indicate improved BLE strength.     Time 4   Period Weeks   Status New  PT SHORT TERM GOAL #3   Title Will assess gait speed and write appropriate goal.    Time 4   Period Weeks   Status New     PT SHORT TERM GOAL #4   Title Will assess 6MWT and improve distance by 86' in order to indicate improved functional endurance.     Time 4   Period Weeks   Status New     PT SHORT TERM GOAL #5   Title Pt will improve DGI to 16/24 in order to indicate decreased fall risk.     Time 4   Period Weeks   Status New     Additional Short Term Goals   Additional Short Term Goals Yes     PT SHORT TERM GOAL #6   Title Pt will ambulate over varying indoor surfaces while scanning environment up to 300' w/ LRAD at mod I level in order to indicate safe home negotiation.    Time 4   Period Weeks   Status New           PT Long Term Goals - 05/22/16 2030      PT LONG  TERM GOAL #1   Title Pt/sister will be independent with final HEP in order to indicate improved functional mobility and decreased fall risk (Target Date: 07/17/16)   Time 8   Period Weeks   Status New     PT LONG TERM GOAL #2   Title Pt will negotiate 2 steps, then 1 step without rails at S level in alternating fashion in order to indicate safe entry/exit at home.     Time 8   Period Weeks   Status New     PT LONG TERM GOAL #3   Title Pt will ambulate with gait speed >/= 2.00 ft/sec in order to indicate decreased fall risk and improved efficiency of gait.     Time 8   Period Weeks   Status New     PT LONG TERM GOAL #4   Title Pt will improve DGI to >19/24 in order to indicate decreased fall risk.     Time 8   Period Weeks   Status New     PT LONG TERM GOAL #5   Title Pt will improve 6MWT distance by 150' from baseline in order to indicate improved functional endurance.     Time 8   Period Weeks   Status New     Additional Long Term Goals   Additional Long Term Goals Yes     PT LONG TERM GOAL #6   Title Pt will ambulate up to 500' over unlevel paved surfaces w/ LRAD at mod I level in order to indicate return to community activities.     Time 8   Period Weeks   Status New               Plan - 06/08/16 1203    Clinical Impression Statement Addressed right trunk lean/ shortening with stretching and postural training; pt had difficulty carrying over body mechanics during gait.  Worked on balance with gait amb. on uneven outdoor surface, cues for posture and visual scanning; pt required min guard.                             Rehab Potential Good   Clinical Impairments Affecting Rehab Potential see past medical history above   PT Frequency 2x / week   PT Duration  8 weeks   PT Treatment/Interventions ADLs/Self Care Home Management;DME Instruction;Gait training;Stair training;Functional mobility training;Therapeutic activities;Therapeutic exercise;Balance  training;Neuromuscular re-education;Cognitive remediation;Patient/family education;Vestibular;Energy conservation   PT Next Visit Plan gait with head turns/enviromental scanning, balance on complaint surfaces, balance with single leg stance emphasis   Consulted and Agree with Plan of Care Patient;Family member/caregiver   Family Member Consulted sister, [name]       Patient will benefit from skilled therapeutic intervention in order to improve the following deficits and impairments:  Decreased activity tolerance, Decreased balance, Decreased cognition, Decreased coordination, Decreased endurance, Decreased knowledge of use of DME, Decreased mobility, Decreased strength, Impaired perceived functional ability, Impaired flexibility, Postural dysfunction  Visit Diagnosis: Unsteadiness on feet  Muscle weakness (generalized)  Other abnormalities of gait and mobility     Problem List There are no active problems to display for this patient.   Bjorn Loser, PTA  06/08/16, 12:09 PM [facility] [address] [place], [place], [address] Phone: [phone]   Fax:  [phone]  Name: Bridget Jackson MRN: [id number] Date of Birth: [birth year]

## 2016-06-10 ENCOUNTER — Ambulatory Visit: Payer: Medicare Other | Admitting: Occupational Therapy

## 2016-06-10 DIAGNOSIS — R2681 Unsteadiness on feet: Secondary | ICD-10-CM

## 2016-06-10 DIAGNOSIS — R41844 Frontal lobe and executive function deficit: Secondary | ICD-10-CM

## 2016-06-10 DIAGNOSIS — M6281 Muscle weakness (generalized): Secondary | ICD-10-CM | POA: Diagnosis not present

## 2016-06-10 DIAGNOSIS — R2689 Other abnormalities of gait and mobility: Secondary | ICD-10-CM | POA: Diagnosis not present

## 2016-06-10 DIAGNOSIS — R41841 Cognitive communication deficit: Secondary | ICD-10-CM | POA: Diagnosis not present

## 2016-06-10 NOTE — Therapy (Signed)
[facility] [address] [place], [place], [address] Phone: [phone]   Fax:  [phone]  Occupational Therapy Treatment  Patient Details  Name: Bridget Jackson MRN: [id number] Date of Birth: 09/11/1950 Referring Provider: Dr. Earline Mayotte  Encounter Date: 06/10/2016      OT End of Session - 06/10/16 1159    Visit Number 6   Number of Visits 8   Date for OT Re-Evaluation 06/23/16   Authorization Type medicare with BCBS as secondary will need PN and G code every 10th visit   Authorization Time Period 60 days   Authorization - Visit Number 6   Authorization - Number of Visits 10   OT Start Time 1100   OT Stop Time 1145   OT Time Calculation (min) 45 min   Activity Tolerance Patient tolerated treatment well      No past medical history on file.  No past surgical history on file.  There were no vitals filed for this visit.      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 No/denies                      OT Treatments/Exercises (OP) - 06/10/16 0001      ADLs   Bathing Pt reports apt in [facility] has glass doors for tub/shower. Discussed options for this including continuing to use tub bench but it would require taking down doors, or use of grab bar and all in shower chair. Practiced with simulated task and stepping over obstacle - pt had difficulty with this and recommended practicing at home with sister/close supervision for safety. Pt had decreased ankle support with this. Pt also reported she may have walk in shower stall built but still recommended grab bar and shower seat, as well as non skid mat in shower   Home Maintenance Pt practiced carrying laundry basket  and opening door (how pt would need to do at home). Pt did w/o LOB. Pt practiced sweeping w/ 1 self corrected LOB.  Recommended pt sweep dirt over near stable surface/countertop and use counter for balance when sweeping dirt into dustpan and bending over to pick up.    ADL Comments Discussed/recommended [employer] if/when pt returns home. Also discussed looking into public transportation or friends/family for community outings (anticipate driving will be in the distant future d/t pt's decr. processing speed and reaction time)                      OT Long Term Goals - 06/10/16 1200      OT LONG TERM GOAL #1   Title Pt and sister will be mod I with home actvities program - 06/23/2016   Status On-going     OT LONG TERM GOAL #2   Title Pt will demonstrate improved grip strength for L hand by 5 pounds to assist with cutting food and food prep (baseline= 40)   Status On-going     OT LONG TERM GOAL #3   Title Pt will be mod I with shower and night toilet transfers   Status On-going  06/08/16  supervision for shower transfers, night toliet transfers mod I per pt (carries call button just in case)     OT LONG TERM GOAL #4   Title Pt will be mod I with bathing   Status  Achieved  06/08/16 met per pt report     OT LONG TERM GOAL #5   Title Pt will be mod I with simple hot familiar meal prep   Status Achieved  met in clinic 06/04/16. Therapist however did recommend distant supervision     Long Term Additional Goals   Additional Long Term Goals Yes     OT LONG TERM GOAL #6   Title Pt to be supervision only with more complex meal prep   Status New               Plan - 06/10/16 1201    Clinical Impression Statement Pt approximating original LTG's. Added/updated LTG #6. Pt continues to make progress with IADLS in prep for return to living alone, but still requires sup currently for safety d/t decr. proecessing speed, balance, and higher level planning ahead/executive functioning.    Rehab Potential Fair   OT Frequency 2x / week   OT Duration 4 weeks   OT Treatment/Interventions Self-care/ADL  training;Neuromuscular education;Therapeutic exercise;Functional Mobility Training;Cognitive remediation/compensation;Therapeutic activities;Patient/family education;Balance training   Plan prepare/cook meal, balance w/ IADLS, continue working on higher level functional cognition (meal planning, organizing day). Consider renewal in 1-2 weeks   OT Home Exercise Plan Initiated HEP - resistive putty - red   Consulted and Agree with Plan of Care Patient      Patient will benefit from skilled therapeutic intervention in order to improve the following deficits and impairments:  Decreased activity tolerance, Decreased balance, Decreased cognition, Decreased safety awareness, Decreased mobility, Decreased strength, Difficulty walking, Impaired UE functional use, Pain  Visit Diagnosis: Unsteadiness on feet  Frontal lobe and executive function deficit    Problem List There are no active problems to display for this patient.   Carey Bullocks, OTR/L 06/10/2016, 12:06 PM  [facility] [address] [place], [place], [address] Phone: [phone]   Fax:  [phone]  Name: Bridget Jackson MRN: [id number] Date of Birth: 1950-08-02

## 2016-06-11 ENCOUNTER — Ambulatory Visit: Payer: Medicare Other | Admitting: Physical Therapy

## 2016-06-11 ENCOUNTER — Ambulatory Visit: Payer: Medicare Other | Admitting: Speech Pathology

## 2016-06-11 ENCOUNTER — Encounter: Payer: Self-pay | Admitting: Physical Therapy

## 2016-06-11 DIAGNOSIS — R2681 Unsteadiness on feet: Secondary | ICD-10-CM | POA: Diagnosis not present

## 2016-06-11 DIAGNOSIS — M6281 Muscle weakness (generalized): Secondary | ICD-10-CM

## 2016-06-11 DIAGNOSIS — R41844 Frontal lobe and executive function deficit: Secondary | ICD-10-CM | POA: Diagnosis not present

## 2016-06-11 DIAGNOSIS — R41841 Cognitive communication deficit: Secondary | ICD-10-CM

## 2016-06-11 DIAGNOSIS — R2689 Other abnormalities of gait and mobility: Secondary | ICD-10-CM | POA: Diagnosis not present

## 2016-06-11 NOTE — Therapy (Signed)
[facility] [address] [place], [place], [address] Phone: [phone]   Fax:  [phone]  Physical Therapy Treatment  Patient Details  Name: Bridget Jackson MRN: [id number] Date of Birth: 02/26/1950 Referring Provider: Earline Mayotte, MD  Encounter Date: 06/11/2016      PT End of Session - 06/11/16 1606    Visit Number 7   Number of Visits 17   Date for PT Re-Evaluation 07/21/16   Authorization Type MCR prim, BCBS sec G Code needed every 10th visit   PT Start Time 1407   PT Stop Time 1445   PT Time Calculation (min) 38 min   Activity Tolerance Patient tolerated treatment well      History reviewed. No pertinent past medical history.  History reviewed. No pertinent surgical history.  There were no vitals filed for this visit.      Subjective Assessment - 06/11/16 1409    Subjective Pt walked to the rose garden and also went to the park (rested as needed) with sister.   Currently in Pain? No/denies                         [facility] Adult PT Treatment/Exercise - 06/11/16 0001      Transfers   Transfers Sit to Stand;Stand to Sit   Sit to Stand 5: Supervision   Sit to Stand Details Tactile cues for weight shifting   Sit to Stand Details (indicate cue type and reason) training to increase weight shift onto left side to increase midline posture vs lateral right lean; multiple reps.                    Ambulation/Gait   Ambulation/Gait Yes   Ambulation/Gait Assistance 5: Supervision   Ambulation/Gait Assistance Details training for postural awareness and performed raised right arm overhead to elongate right trunk; cues for heel-toe-gait and upright gaze.              Ambulation Distance (Feet) 230 Feet   Assistive device None   Gait Pattern Step-through pattern;Decreased stride length;Decreased arm swing - right;Decreased arm swing - left;Lateral trunk lean to right;Trunk flexed   Ambulation Surface Level;Indoor     Knee/Hip Exercises: Stretches   Other Knee/Hip Stretches Right hip and trunk stretch to address shortening and leaning on the right side: seated edge of mat resting left elbow on mat and guided multi level reaching to extend right side of trunk.   Other Knee/Hip Stretches seated ankle pumps with knee extended to stretch back of LE, x 10 each             Balance Exercises - 06/11/16 1423      Balance Exercises: Standing   Tandem Gait Forward;Upper extremity support  progressing to intermittent UE support   Sidestepping Other (comment)  working on trunk and pelvic position; no UE support   Marching Limitations progressing from 1 to intermittent UE support, cues for posture, step length, and visual scanning.   Sit to Stand Time sit<>stands without UE support 5x2 cues for body mechnics             PT Short Term Goals - 05/22/16 2025      PT SHORT TERM GOAL #1   Title Pt/sister will be independent with initial HEP in order to indicate improved functional mobility and decreased fall risk. (Target Date: 06/19/16)   Time 4   Period Weeks   Status New  PT SHORT TERM GOAL #2   Title Pt will perform 5TSS in </=15 secs without UE support in order to indicate improved BLE strength.     Time 4   Period Weeks   Status New     PT SHORT TERM GOAL #3   Title Will assess gait speed and write appropriate goal.    Time 4   Period Weeks   Status New     PT SHORT TERM GOAL #4   Title Will assess 6MWT and improve distance by 21' in order to indicate improved functional endurance.     Time 4   Period Weeks   Status New     PT SHORT TERM GOAL #5   Title Pt will improve DGI to 16/24 in order to indicate decreased fall risk.     Time 4   Period Weeks   Status New     Additional Short Term Goals   Additional Short Term Goals Yes     PT SHORT TERM GOAL #6   Title Pt will ambulate over varying indoor surfaces while scanning environment up to  300' w/ LRAD at mod I level in order to indicate safe home negotiation.    Time 4   Period Weeks   Status New           PT Long Term Goals - 05/22/16 2030      PT LONG TERM GOAL #1   Title Pt/sister will be independent with final HEP in order to indicate improved functional mobility and decreased fall risk (Target Date: 07/17/16)   Time 8   Period Weeks   Status New     PT LONG TERM GOAL #2   Title Pt will negotiate 2 steps, then 1 step without rails at S level in alternating fashion in order to indicate safe entry/exit at home.     Time 8   Period Weeks   Status New     PT LONG TERM GOAL #3   Title Pt will ambulate with gait speed >/= 2.00 ft/sec in order to indicate decreased fall risk and improved efficiency of gait.     Time 8   Period Weeks   Status New     PT LONG TERM GOAL #4   Title Pt will improve DGI to >19/24 in order to indicate decreased fall risk.     Time 8   Period Weeks   Status New     PT LONG TERM GOAL #5   Title Pt will improve 6MWT distance by 150' from baseline in order to indicate improved functional endurance.     Time 8   Period Weeks   Status New     Additional Long Term Goals   Additional Long Term Goals Yes     PT LONG TERM GOAL #6   Title Pt will ambulate up to 500' over unlevel paved surfaces w/ LRAD at mod I level in order to indicate return to community activities.     Time 8   Period Weeks   Status New               Plan - 06/11/16 1607    Clinical Impression Statement Continued to addressed right trunk lean/ shortening with stretching and postural training; pt had difficulty carrying over body mechanics during gait; used RUE over head raise to help activate R trunk. Worked on balance with dynamic gait; pt requires intermittnet UE support with narrow BOS.  Rehab Potential Good   Clinical Impairments Affecting Rehab Potential see past medical history above   PT Frequency 2x /  week   PT Duration 8 weeks   PT Treatment/Interventions ADLs/Self Care Home Management;DME Instruction;Gait training;Stair training;Functional mobility training;Therapeutic activities;Therapeutic exercise;Balance training;Neuromuscular re-education;Cognitive remediation;Patient/family education;Vestibular;Energy conservation   PT Next Visit Plan gait with head turns/enviromental scanning, balance on complaint surfaces, balance with single leg stance emphasis   Consulted and Agree with Plan of Care Patient;Family member/caregiver   Family Member Consulted sister, [name]       Patient will benefit from skilled therapeutic intervention in order to improve the following deficits and impairments:  Decreased activity tolerance, Decreased balance, Decreased cognition, Decreased coordination, Decreased endurance, Decreased knowledge of use of DME, Decreased mobility, Decreased strength, Impaired perceived functional ability, Impaired flexibility, Postural dysfunction  Visit Diagnosis: Unsteadiness on feet  Muscle weakness (generalized)  Other abnormalities of gait and mobility     Problem List There are no active problems to display for this patient.   Bjorn Loser, PTA  06/11/16, 4:15 PM [facility] [address] [place], [place], [address] Phone: [phone]   Fax:  [phone]  Name: Bridget Jackson MRN: [id number] Date of Birth: 13-Aug-1950

## 2016-06-11 NOTE — Therapy (Signed)
[facility] [address] [place], [place], [address] Phone: [phone]   Fax:  [phone]  Speech Language Pathology Treatment  Patient Details  Name: Bridget Jackson MRN: [id number] Date of Birth: 11/01/1950 Referring Provider: Dr. Earline Mayotte  Encounter Date: 06/11/2016      End of Session - 06/11/16 1540    Visit Number 6   Number of Visits 17   Date for SLP Re-Evaluation 07/24/16   SLP Start Time 1450   SLP Stop Time  1532   SLP Time Calculation (min) 42 min   Activity Tolerance Patient tolerated treatment well      No past medical history on file.  No past surgical history on file.  There were no vitals filed for this visit.      Subjective Assessment - 06/11/16 1453    Subjective "It's been going well. I did my homework."   Currently in Pain? No/denies               ADULT SLP TREATMENT - 06/11/16 1457      General Information   Behavior/Cognition Alert;Cooperative;Pleasant mood   Patient Positioning Upright in chair   Oral care provided N/A     Treatment Provided   Treatment provided Cognitive-Linquistic     Pain Assessment   Pain Assessment No/denies pain     Cognitive-Linquistic Treatment   Treatment focused on Cognition   Skilled Treatment Targeted alternating attention between simple category naming task and word completion task. Pt required extended time for category naming, but was noted to check her word completion task for errors. She also required rare min A for attention to specific verbal instructions. Alternating attention simple time drawing task and SLP interruptions with conversation relating to her daily schedule with <90% accuracy, again noted to check work for accuracy and correct slight errors (hand length) Functional time word problems 80% accuracy and occasional mod A.     Assessment / Recommendations / Plan   Plan Continue with current plan of care      Progression Toward Goals   Progression toward goals Progressing toward goals          SLP Education - 06/11/16 1539    Education provided Yes   Education Details Carry calendar system to write down information at the time it occurs   Person(s) Educated Patient   Methods Explanation   Comprehension Verbalized understanding          SLP Short Term Goals - 06/11/16 1500      SLP SHORT TERM GOAL #1   Title Pt will  alterate attention between 2 simple functional cognitive linguistic tasks with 85% accuracy on each and occasional min A   Baseline 5.10.18   Time 2   Period Weeks   Status On-going     SLP SHORT TERM GOAL #2   Title Pt will solve mildly complex organanization, reasoning, functional math problems with 85% accuracy and rare min A   Baseline 5.10.18 mod A, 80%   Time 2   Period Weeks   Status On-going     SLP SHORT TERM GOAL #3   Title Pt will complete mildly complex naming tasks with 90% accuracy and rare min A   Time 2   Period Weeks   Status On-going     SLP SHORT TERM GOAL #4   Title Pt will utilize external aids to recall appoinments, conversations, lists over 3 sessions with occasional min A   Baseline 06/11/16  pt reports she uses legal pad at home, requested she bring to next session   Time 2   Period Weeks   Status On-going          SLP Long Term Goals - 06/11/16 [place] #1   Title Pt will divide attention between 2 simple cognitive linguistic tasks with 85% on each and occasional min A   Time 6   Period Weeks   Status On-going     SLP LONG TERM GOAL #2   Title Pt will utilize compensations for word finding episodes with rare min A 80% of opportunities   Time 6   Period Weeks   Status On-going     SLP LONG TERM GOAL #3   Title Pt will identify and self correct errors on cognitive tasks with occasional min A   Time 6   Period Weeks   Status On-going          Plan - 06/11/16 1541    Clinical Impression  Statement Cognitive linguistic deficts in awareness and simple reasoning were observed today, though pt does make efforts to check written work for accuracy. Auditory/mental processing again appeared slower than normal. Skilled ST to maximize cognitive linguistic skills remains necessary for possible return home to live alone.    Speech Therapy Frequency 2x / week   Treatment/Interventions Compensatory strategies;Patient/family education;Functional tasks;Cognitive reorganization;Internal/external aids;SLP instruction and feedback;Cueing hierarchy   Potential to Achieve Goals Good   Potential Considerations Ability to learn/carryover information   Consulted and Agree with Plan of Care Patient      Patient will benefit from skilled therapeutic intervention in order to improve the following deficits and impairments:   Cognitive communication deficit  Frontal lobe and executive function deficit    Problem List There are no active problems to display for this patient.  Deneise Lever, [place], CCC-SLP Speech-Language Pathologist  Aliene Altes 06/11/2016, 3:44 PM  [facility] [address] [place], [place], [address] Phone: [phone]   Fax:  [phone]   Name: Bridget Jackson MRN: [id number] Date of Birth: 1950-03-05

## 2016-06-15 ENCOUNTER — Ambulatory Visit: Payer: Medicare Other

## 2016-06-15 ENCOUNTER — Encounter: Payer: Self-pay | Admitting: Occupational Therapy

## 2016-06-15 ENCOUNTER — Ambulatory Visit: Payer: Medicare Other | Admitting: Physical Therapy

## 2016-06-15 ENCOUNTER — Ambulatory Visit: Payer: Medicare Other | Admitting: Occupational Therapy

## 2016-06-15 DIAGNOSIS — R41844 Frontal lobe and executive function deficit: Secondary | ICD-10-CM

## 2016-06-15 DIAGNOSIS — M6281 Muscle weakness (generalized): Secondary | ICD-10-CM | POA: Diagnosis not present

## 2016-06-15 DIAGNOSIS — R41841 Cognitive communication deficit: Secondary | ICD-10-CM

## 2016-06-15 DIAGNOSIS — R2681 Unsteadiness on feet: Secondary | ICD-10-CM

## 2016-06-15 DIAGNOSIS — R2689 Other abnormalities of gait and mobility: Secondary | ICD-10-CM | POA: Diagnosis not present

## 2016-06-15 NOTE — Patient Instructions (Signed)
1. Grip Strengthening (Resistive Putty)  GREEN PUTTY -  Throw the red out!!  Do 2 times per day every day!!   Squeeze putty using thumb and all fingers. Repeat _20___ times. Do __2__ sessions per day.        Copyright  VHI. All rights reserved.

## 2016-06-15 NOTE — Therapy (Signed)
[facility] [address] [place], [place], [address] Phone: [phone]   Fax:  [phone]  Physical Therapy Treatment  Patient Details  Name: Bridget Jackson MRN: [id number] Date of Birth: 1951/01/23 Referring Provider: Earline Mayotte, MD  Encounter Date: 06/15/2016      PT End of Session - 06/15/16 1222    Visit Number 8   Number of Visits 17   Date for PT Re-Evaluation 07/21/16   Authorization Type MCR prim, BCBS sec G Code needed every 10th visit   PT Start Time 1103   PT Stop Time 1147   PT Time Calculation (min) 44 min   Behavior During Therapy [facility] for tasks assessed/performed      No past medical history on file.  No past surgical history on file.  There were no vitals filed for this visit.      Subjective Assessment - 06/15/16 1104    Subjective Pt had some imbalance negotiating 2steps no rails at the town house; pt usually has HHA.   Currently in Pain? No/denies            [facility] PT Assessment - 06/15/16 0001      Dynamic Gait Index   Level Surface Mild Impairment   Change in Gait Speed Mild Impairment   Gait with Horizontal Head Turns Normal   Gait with Vertical Head Turns Mild Impairment   Gait and Pivot Turn Normal   Step Over Obstacle Moderate Impairment   Step Around Obstacles Normal   Steps Moderate Impairment   Total Score 17   DGI comment: Scores of 19 or less are predictive of falls in older community living adults                     [facility] Adult PT Treatment/Exercise - 06/15/16 0001      Ambulation/Gait   Ambulation/Gait Yes   Ambulation/Gait Assistance 6: Modified independent (Device/Increase time)   Ambulation/Gait Assistance Details 6 MWT = 81f; no rest breaks, no LOB   Ambulation Distance (Feet) 895 Feet   Assistive device None   Gait Pattern Step-through pattern;Decreased stride length;Decreased arm swing - right;Decreased arm swing -  left;Lateral trunk lean to right;Trunk flexed   Ambulation Surface Level;Indoor             Balance Exercises - 06/15/16 1221      Balance Exercises: Standing   Standing Eyes Opened     SIT<>STANDS Wide (BOA);Head turns;Solid surface;4 reps  cues for balance strategies.    Without UE support; 5x2, min cues for body mechanics.           PT Education - 06/15/16 1222    Education provided Yes   Education Details Discussed goals checked.   Person(s) Educated Patient   Methods Explanation   Comprehension Verbalized understanding          PT Short Term Goals - 06/15/16 1225      PT SHORT TERM GOAL #1   Title Pt/sister will be independent with initial HEP in order to indicate improved functional mobility and decreased fall risk. (Target Date: 06/19/16)   Time 4   Period Weeks   Status New     PT SHORT TERM GOAL #2   Title Pt will perform 5TSS in </=15 secs without UE support in order to indicate improved BLE strength.     Time 4   Period Weeks   Status New     PT SHORT TERM GOAL #  3   Title Will assess gait speed and write appropriate goal.    Time 4   Period Weeks   Status New     PT SHORT TERM GOAL #4   Title Will assess 6MWT and improve distance by 3' in order to indicate improved functional endurance.     Baseline Inital distance 540' and distance for 06/15/16 895' no AD, no seated rest breaks; Met.   Time 4   Period Weeks   Status Achieved     PT SHORT TERM GOAL #5   Title Pt will improve DGI to 16/24 in order to indicate decreased fall risk.     Baseline DGI 17/24, 06/15/16.   Time 4   Period Weeks   Status Achieved     PT SHORT TERM GOAL #6   Title Pt will ambulate over varying indoor surfaces while scanning environment up to 300' w/ LRAD at mod I level in order to indicate safe home negotiation.    Baseline 300' in home environment Mod I; met ,06/15/16.   Time 4   Period Weeks   Status Achieved           PT Long Term Goals - 05/22/16  2030      PT LONG TERM GOAL #1   Title Pt/sister will be independent with final HEP in order to indicate improved functional mobility and decreased fall risk (Target Date: 07/17/16)   Time 8   Period Weeks   Status New     PT LONG TERM GOAL #2   Title Pt will negotiate 2 steps, then 1 step without rails at S level in alternating fashion in order to indicate safe entry/exit at home.     Time 8   Period Weeks   Status New     PT LONG TERM GOAL #3   Title Pt will ambulate with gait speed >/= 2.00 ft/sec in order to indicate decreased fall risk and improved efficiency of gait.     Time 8   Period Weeks   Status New     PT LONG TERM GOAL #4   Title Pt will improve DGI to >19/24 in order to indicate decreased fall risk.     Time 8   Period Weeks   Status New     PT LONG TERM GOAL #5   Title Pt will improve 6MWT distance by 150' from baseline in order to indicate improved functional endurance.     Time 8   Period Weeks   Status New     Additional Long Term Goals   Additional Long Term Goals Yes     PT LONG TERM GOAL #6   Title Pt will ambulate up to 500' over unlevel paved surfaces w/ LRAD at mod I level in order to indicate return to community activities.     Time 8   Period Weeks   Status New               Plan - 06/15/16 1141    Clinical Impression Statement Initiated STG check. Pt met STG # 4,5,6.  Progressed balance training with standing on compliant surface with WBOS; pt requires intermittent UE support.   Rehab Potential Good   Clinical Impairments Affecting Rehab Potential see past medical history above   PT Frequency 2x / week   PT Duration 8 weeks   PT Treatment/Interventions ADLs/Self Care Home Management;DME Instruction;Gait training;Stair training;Functional mobility training;Therapeutic activities;Therapeutic exercise;Balance training;Neuromuscular re-education;Cognitive remediation;Patient/family education;Vestibular;Energy conservation   PT Next  Visit  Plan Finish checking STGs   gait with head turns/enviromental scanning, balance on complaint surfaces, balance with single leg stance emphasis, compliant surface;  Functional tub negotiation stepping over tub side.   Consulted and Agree with Plan of Care Patient;Family member/caregiver   Family Member Consulted sister, [name]       Patient will benefit from skilled therapeutic intervention in order to improve the following deficits and impairments:  Decreased activity tolerance, Decreased balance, Decreased cognition, Decreased coordination, Decreased endurance, Decreased knowledge of use of DME, Decreased mobility, Decreased strength, Impaired perceived functional ability, Impaired flexibility, Postural dysfunction  Visit Diagnosis: Unsteadiness on feet  Muscle weakness (generalized)  Other abnormalities of gait and mobility     Problem List There are no active problems to display for this patient.  Bjorn Loser, PTA  06/15/16, 2:51 PM [facility] [address] [place], [place], [address] Phone: [phone]   Fax:  [phone]  Name: Bridget Jackson MRN: [id number] Date of Birth: 1950/04/15

## 2016-06-15 NOTE — Therapy (Signed)
[facility] [address] [place], [place], [address] Phone: [phone]   Fax:  [phone]  Speech Language Pathology Treatment  Patient Details  Name: Bridget Jackson MRN: [id number] Date of Birth: [birth year] Referring Provider: Dr. Earline Mayotte  Encounter Date: 06/15/2016      End of Session - 06/15/16 1301    Visit Number 7   Number of Visits 17   Date for SLP Re-Evaluation 07/24/16   SLP Start Time 1150   SLP Stop Time  1231   SLP Time Calculation (min) 41 min      No past medical history on file.  No past surgical history on file.  There were no vitals filed for this visit.      Subjective Assessment - 06/15/16 1151    Subjective Pt got homework out for SLP, and showed SLP her papers of her schedule.   Currently in Pain? No/denies               ADULT SLP TREATMENT - 06/15/16 1201      General Information   Behavior/Cognition Alert;Cooperative;Pleasant mood     Treatment Provided   Treatment provided Cognitive-Linquistic     Cognitive-Linquistic Treatment   Treatment focused on Cognition   Skilled Treatment SLP facilitated pt's alternating attention by requiring her to complete a task including a map and questions re: the map. Pt needed extra time to complete this task (modified independence), without cues needed for task retrieval after SLP conversation interruptions.  Pt did however routinely miss details of the map necessary to accurately ansewr questions. This required occasional min-mod A from SLP for attention to detail. PT showed decr'd emergent awareness for all errors today.     Assessment / Recommendations / Plan   Plan Continue with current plan of care     Progression Toward Goals   Progression toward goals Progressing toward goals          SLP Education - 06/15/16 1301    Education provided Yes   Education Details attention to detail compensations   Person(s)  Educated Patient   Methods Explanation;Verbal cues   Comprehension Verbalized understanding;Need further instruction          SLP Short Term Goals - 06/15/16 1303      SLP SHORT TERM GOAL #1   Title Pt will  alterate attention between 2 simple functional cognitive linguistic tasks with 85% accuracy on each and occasional min A   Status Achieved     SLP SHORT TERM GOAL #2   Title Pt will solve mildly complex organanization, reasoning, functional math problems with 85% accuracy and rare min A   Baseline 5.10.18 mod A, 80%   Time 1   Period Weeks   Status On-going     SLP SHORT TERM GOAL #3   Title Pt will complete mildly complex naming tasks with 90% accuracy and rare min A   Time 1   Period Weeks   Status On-going     SLP SHORT TERM GOAL #4   Title Pt will utilize external aids to recall appoinments, conversations, lists over 3 sessions with occasional min A   Baseline 06/11/16 pt reports she uses legal pad at home, requested she bring to next session   Time 1   Period Weeks   Status On-going          SLP Long Term Goals - 06/15/16 1303      SLP LONG TERM GOAL #1  Title Pt will divide attention between 2 simple cognitive linguistic tasks with 85% on each and occasional min A   Time 5   Period Weeks   Status On-going     SLP LONG TERM GOAL #2   Title Pt will utilize compensations for word finding episodes with rare min A 80% of opportunities   Time 5   Period Weeks   Status On-going     SLP LONG TERM GOAL #3   Title Pt will identify and self correct errors on cognitive tasks with occasional min A   Time 5   Period Weeks   Status On-going          Plan - 06/15/16 1302    Clinical Impression Statement 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 Auditory/mental processing again appeared slower than normal. Skilled ST to maximize cognitive linguistic skills remains necessary for possible return home to live  alone.    Speech Therapy Frequency 2x / week   Duration --  8 eweks   Treatment/Interventions Compensatory strategies;Patient/family education;Functional tasks;Cognitive reorganization;Internal/external aids;SLP instruction and feedback;Cueing hierarchy   Potential to Achieve Goals Good   Potential Considerations Ability to learn/carryover information      Patient will benefit from skilled therapeutic intervention in order to improve the following deficits and impairments:   Cognitive communication deficit    Problem List There are no active problems to display for this patient.   [facility] ,[place], [place]  06/15/2016, 1:04 PM  [facility] [address] [place], [place], [address] Phone: [phone]   Fax:  [phone]   Name: Bridget Jackson MRN: [id number] Date of Birth: [birth year]

## 2016-06-15 NOTE — Patient Instructions (Signed)
  Please complete the assigned speech therapy homework prior to your next session and return it to the speech therapist at your next visit.  

## 2016-06-15 NOTE — Therapy (Signed)
[facility] [address] [place], [place], [address] Phone: [phone]   Fax:  [phone]  Occupational Therapy Treatment  Patient Details  Name: Bridget Jackson MRN: [id number] Date of Birth: [birth year] Referring Provider: Dr. Earline Mayotte  Encounter Date: 06/15/2016      OT End of Session - 06/15/16 1252    Visit Number 7   Number of Visits 8   Date for OT Re-Evaluation 06/23/16   Authorization Type medicare with BCBS as secondary will need PN and G code every 10th visit   Authorization Time Period 60 days   Authorization - Visit Number 7   Authorization - Number of Visits 10   OT Start Time 1017   OT Stop Time 1100   OT Time Calculation (min) 43 min   Activity Tolerance Patient tolerated treatment well      History reviewed. No pertinent past medical history.  History reviewed. No pertinent surgical history.  There were no vitals filed for this visit.      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 No/denies                      OT Treatments/Exercises (OP) - 06/15/16 0001      ADLs   ADL Comments Discussed shower transfers at home - pt reports that she has been practicing standing and climbing over edge of tub with grab bar as that is what she wishes to do if she returns home.  Pt requires supervision only for this at this time.  Pt would need shower seat (currently has transfer tub bench) so provided pictures and information regarding what to consider when ordering (reviewed and then provided in writing - pt to review with sister).  Pt grip strength is currently the same as baseline (40 pounds) therefore increased resistance (red to green putty) and reps (10 -20 reps x2 per day).  Pt  verbalized understanding.  Also discussed meal prep with pt - pt is assisting her sister but her sister is taking the lead.  Made a list of familiar meals pt made at home prior to stroke and recommended pt make these at home with her sister providing supervision only - wrote request for this for sister as well as cueing hierarchy to allow pt to begin to problem solve and take the lead on familiar hot meal prep with supervision. Pt able to verbalize that she should never be in the kitchen at this time without supervision and this was also put in writing.                 OT Education - 06/15/16 1249    Education provided Yes   Education Details upgraded putty, cooking activities at home with supervision, info on bathroom equipment   Person(s) Educated Patient   Methods Explanation;Demonstration;Handout   Comprehension Verbalized understanding;Returned demonstration             OT Long Term Goals - 06/15/16 1250      OT LONG TERM GOAL #1   Title Pt and sister will be mod I with home actvities program - 06/23/2016   Status On-going     OT LONG TERM GOAL #2   Title Pt will  demonstrate improved grip strength for L hand by 5 pounds to assist with cutting food and food prep (baseline= 40)   Status On-going     OT LONG TERM GOAL #3   Title Pt will be mod I with shower and night toilet transfers   Status On-going  06/08/16  supervision for shower transfers, night toliet transfers mod I per pt (carries call button just in case)     OT LONG TERM GOAL #4   Title Pt will be mod I with bathing   Status Achieved  06/08/16 met per pt report     OT LONG TERM GOAL #5   Title Pt will be mod I with simple hot familiar meal prep   Status Achieved  met in clinic 06/04/16. Therapist however did recommend distant supervision     OT LONG TERM GOAL #6   Title Pt to be supervision only with more complex meal prep   Status On-going               Plan - 06/15/16 1250    Clinical  Impression Statement Pt progressing toward goals and demonstrating some improvement in rate of processing information.     Rehab Potential Fair   OT Frequency 2x / week   OT Duration 4 weeks   OT Treatment/Interventions Self-care/ADL training;Neuromuscular education;Therapeutic exercise;Functional Mobility Training;Cognitive remediation/compensation;Therapeutic activities;Patient/family education;Balance training   Plan check home activities, cook meal, balance related to standing shower transfers, activity tolerance.    Consulted and Agree with Plan of Care Patient      Patient will benefit from skilled therapeutic intervention in order to improve the following deficits and impairments:  Decreased activity tolerance, Decreased balance, Decreased cognition, Decreased safety awareness, Decreased mobility, Decreased strength, Difficulty walking, Impaired UE functional use, Pain  Visit Diagnosis: Frontal lobe and executive function deficit  Unsteadiness on feet  Muscle weakness (generalized)    Problem List There are no active problems to display for this patient.   Quay Burow, OTR/L 06/15/2016, 12:54 PM  [facility] [address] [place], [place], [address] Phone: [phone]   Fax:  [phone]  Name: Mirela Parsley MRN: [id number] Date of Birth: 1950/11/13

## 2016-06-17 ENCOUNTER — Ambulatory Visit: Payer: Medicare Other | Admitting: Physical Therapy

## 2016-06-17 ENCOUNTER — Ambulatory Visit: Payer: Medicare Other | Admitting: Occupational Therapy

## 2016-06-17 ENCOUNTER — Encounter: Payer: Self-pay | Admitting: Occupational Therapy

## 2016-06-17 ENCOUNTER — Ambulatory Visit: Payer: Medicare Other

## 2016-06-17 ENCOUNTER — Encounter: Payer: Self-pay | Admitting: Physical Therapy

## 2016-06-17 DIAGNOSIS — M6281 Muscle weakness (generalized): Secondary | ICD-10-CM

## 2016-06-17 DIAGNOSIS — R41841 Cognitive communication deficit: Secondary | ICD-10-CM

## 2016-06-17 DIAGNOSIS — R41844 Frontal lobe and executive function deficit: Secondary | ICD-10-CM | POA: Diagnosis not present

## 2016-06-17 DIAGNOSIS — R2689 Other abnormalities of gait and mobility: Secondary | ICD-10-CM

## 2016-06-17 DIAGNOSIS — R2681 Unsteadiness on feet: Secondary | ICD-10-CM | POA: Diagnosis not present

## 2016-06-17 NOTE — Therapy (Signed)
[facility] [address] [place], [place], [address] Phone: [phone]   Fax:  [phone]  Speech Language Pathology Treatment  Patient Details  Name: Bridget Jackson MRN: [id number] Date of Birth: [birth year] Referring Provider: Dr. Earline Mayotte  Encounter Date: 06/17/2016      End of Session - 06/17/16 1715    Visit Number 8   Number of Visits 17   Date for SLP Re-Evaluation 07/24/16   SLP Start Time 6073   SLP Stop Time  1446   SLP Time Calculation (min) 43 min   Activity Tolerance Patient limited by lethargy      No past medical history on file.  No past surgical history on file.  There were no vitals filed for this visit.      Subjective Assessment - 06/17/16 1417    Subjective Pt got homework out for SLP, and showed SLP homework.               ADULT SLP TREATMENT - 06/17/16 1418      General Information   Behavior/Cognition Alert;Cooperative;Pleasant mood     Treatment Provided   Treatment provided Cognitive-Linquistic     Cognitive-Linquistic Treatment   Treatment focused on Cognition   Skilled Treatment In reviewing pt's homework with her, pt left off 2 items to categorize, and put one down twice (it was on the list twice-pt was not aware). Reduced emergent awareness seen requiring min-mod A consistently with erroneous responses. SLP told pt yesterday she will need to double check all detailed tasks - pt stated she did not look over that task a second time. SLPreminded pt of this and had her repeat those instructions back to SLP prior to starting another detailed written directions task. On first pass, pt missed 3 things on written directions sheet. Pt began double-checking after asking SLP if she should double check her answers. Session ended prior to pt double checking entire sheet, so SLP told pt to take it home and double check. SLP reminded pt to double check her homework  papers as well.     Assessment / Recommendations / Plan   Plan Continue with current plan of care     Progression Toward Goals   Progression toward goals Progressing toward goals          SLP Education - 06/17/16 1714    Education provided Yes   Education Details compensations for reduced attention to detail in cognitive linguistic tasks   Person(s) Educated Patient   Methods Explanation;Verbal cues   Comprehension Verbalized understanding;Need further instruction          SLP Short Term Goals - 06/17/16 1716      SLP SHORT TERM GOAL #1   Title Pt will  alterate attention between 2 simple functional cognitive linguistic tasks with 85% accuracy on each and occasional min A   Status Achieved     SLP SHORT TERM GOAL #2   Title Pt will solve mildly complex organanization, reasoning, functional math problems with 85% accuracy and rare min A   Status Achieved  85% independently     SLP SHORT TERM GOAL #3   Title Pt will complete mildly complex naming tasks with 90% accuracy and rare min A   Status Achieved     SLP SHORT TERM GOAL #4   Title Pt will utilize external aids to recall appoinments, conversations, lists over 3 sessions with occasional min A   Status Achieved  SLP Long Term Goals - 06/17/16 1717      SLP LONG TERM GOAL #1   Title Pt will divide attention between 2 simple cognitive linguistic tasks with 85% on each and occasional min A   Time 5   Period Weeks   Status On-going     SLP LONG TERM GOAL #2   Title Pt will utilize compensations for word finding episodes with rare min A 80% of opportunities   Time 5   Period Weeks   Status On-going     SLP LONG TERM GOAL #3   Title Pt will identify and self correct errors on cognitive tasks with occasional min A   Time 5   Period Weeks   Status On-going          Plan - 06/17/16 1715    Clinical Impression Statement 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 Pt needs to get into habit of double chekcing all work due to decr'd attention t odetail. Auditory/mental processing cont to look slower than normal. Skilled ST to maximize cognitive linguistic skills remains necessary for possible return home to live alone.    Speech Therapy Frequency 2x / week   Duration --  8 eweks   Treatment/Interventions Compensatory strategies;Patient/family education;Functional tasks;Cognitive reorganization;Internal/external aids;SLP instruction and feedback;Cueing hierarchy   Potential to Achieve Goals Good   Potential Considerations Ability to learn/carryover information      Patient will benefit from skilled therapeutic intervention in order to improve the following deficits and impairments:   Cognitive communication deficit    Problem List There are no active problems to display for this patient.   [facility] ,[place], [place]  06/17/2016, 5:24 PM  [facility] [address] [place], [place], [address] Phone: [phone]   Fax:  [phone]   Name: Bridget Jackson MRN: [id number] Date of Birth: [birth year]

## 2016-06-17 NOTE — Patient Instructions (Signed)
Make sure you double check your homework!

## 2016-06-17 NOTE — Therapy (Signed)
[facility] [address] [place], [place], [address] Phone: [phone]   Fax:  [phone]  Occupational Therapy Treatment  Patient Details  Name: Bridget Jackson MRN: [id number] Date of Birth: 07-Nov-1950 Referring Provider: Dr. Earline Mayotte  Encounter Date: 06/17/2016      OT End of Session - 06/17/16 1633    Visit Number 8   Date for OT Re-Evaluation 06/23/16   Authorization Type medicare with BCBS as secondary will need PN and G code every 10th visit   Authorization Time Period 60 days   Authorization - Visit Number 8   Authorization - Number of Visits 10   OT Start Time 8250   OT Stop Time 1600   OT Time Calculation (min) 25 min   Activity Tolerance Patient tolerated treatment well   Behavior During Therapy [facility] for tasks assessed/performed      History reviewed. No pertinent past medical history.  History reviewed. No pertinent surgical history.  There were no vitals filed for this visit.      Subjective Assessment - 06/17/16 1539    Subjective  I made spaghetti- it was really good!   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 No/denies   Pain Score 0-No pain                      OT Treatments/Exercises (OP) - 06/17/16 0001      ADLs   Bathing Patient is bathing self in the shower, however, sister is still present.  Patient indicates that consistently no physical , verbal or set up assistance has been required, therefore long term goal relating to shower transfer is considered met.  Discussed plan for return to independent living in apartment.  Patient plans to either purchase a new shower sear, or take sliding glass doors off to allow to her to use same procedure as she is currently using at he sister's house.  Patient in agreement.  Sister not present today.     Cooking Patient prepared full  hot meal at home.  She planned and cooked spaghetti, with meat and mushroom sauce, with two vegetable sides.  Patient pre-planned cooking activity - putting out needed pots and pans before starting to cook.  Patient made choices to strain enough noodles for she and her sister to allow larger boiling pot time to cool before pouring off into colander.  Patient extremely pleased with performacnce admitting she was apprehensive, but then felt very accomplished.               Balance Exercises - 06/17/16 1523      Balance Exercises: Standing   SLS Eyes open;Intermittent upper extremity support  alternate toe taps on 6" block progressing with upright gaze   Step Ups Forward;6 inch;UE support 2  attempting decreased UE support   Sidestepping Other (comment)  over barrier with UE support to simulate stepping in/out of            OT Education - 06/17/16 1632    Education provided Yes   Education Details Reviewed home activities program, and putty exercises.  Reviewed all long term goals and progress, agreed upon plan to discharge   Person(s) Educated Patient   Methods Explanation   Comprehension Verbalized understanding             OT Long Term Goals - 06/17/16 1637  OT LONG TERM GOAL #1   Title Pt and sister will be mod I with home actvities program - 06/23/2016   Status Achieved     OT LONG TERM GOAL #2   Title Pt will demonstrate improved grip strength for L hand by 5 pounds to assist with cutting food and food prep (baseline= 40)   Status Not Met     OT LONG TERM GOAL #3   Title Pt will be mod I with shower and night toilet transfers   Status Achieved     OT LONG TERM GOAL #4   Title Pt will be mod I with bathing   Status Achieved     OT LONG TERM GOAL #5   Title Pt will be mod I with simple hot familiar meal prep   Status Achieved     OT LONG TERM GOAL #6   Title Pt to be supervision only with more complex meal prep   Status Achieved                Plan - 07/14/16 1633    Clinical Impression Statement Patient has met all but one long term goal, and is ready for discharge from OT   Rehab Potential Fair   OT Frequency 2x / week   OT Duration 4 weeks   OT Treatment/Interventions Self-care/ADL training;Neuromuscular education;Therapeutic exercise;Functional Mobility Training;Cognitive remediation/compensation;Therapeutic activities;Patient/family education;Balance training   Plan discharge from [place] Initiated HEP - resistive putty - red   Consulted and Agree with Plan of Care Patient      Patient will benefit from skilled therapeutic intervention in order to improve the following deficits and impairments:  Decreased activity tolerance, Decreased balance, Decreased cognition, Decreased safety awareness, Decreased mobility, Decreased strength, Difficulty walking, Impaired UE functional use, Pain  Visit Diagnosis: Unsteadiness on feet  Muscle weakness (generalized)      G-Codes - 2016/07/14 1638    Functional Assessment Tool Used (Outpatient only) skilled clnical observation   Functional Limitation Self care   Self Care Current Status (M5784) At least 1 percent but less than 20 percent impaired, limited or restricted   Self Care Goal Status (O9629) At least 1 percent but less than 20 percent impaired, limited or restricted   Self Care Discharge Status [phone]) At least 1 percent but less than 20 percent impaired, limited or restricted      Problem List There are no active problems to display for this patient. OCCUPATIONAL THERAPY DISCHARGE SUMMARY  Visits from Start of Care: 8  Current functional level related to goals / functional outcomes: Increased participation and independence with ADL/IADL   Remaining deficits: Weakness left hand, balance   Education / Equipment: Home exercise program / activity program Plan: Patient agrees to discharge.  Patient goals were partially met. Patient is being  discharged due to meeting the stated rehab goals.  ?????      Mariah Milling, OTR/L 07/14/2016, 4:39 PM  [facility] [address] [place], [place], [address] Phone: [phone]   Fax:  [phone]  Name: Bridget Jackson MRN: [id number] Date of Birth: Aug 18, 1950

## 2016-06-17 NOTE — Therapy (Signed)
[facility] [address] [place], [place], [address] Phone: [phone]   Fax:  [phone]  Physical Therapy Treatment  Patient Details  Name: Bridget Jackson MRN: [id number] Date of Birth: 1950/02/13 Referring Provider: Earline Mayotte, MD  Encounter Date: 06/17/2016      PT End of Session - 06/17/16 1545    Visit Number 9   Number of Visits 17   Date for PT Re-Evaluation 07/21/16   Authorization Type MCR prim, BCBS sec G Code needed every 10th visit   PT Start Time 1451   PT Stop Time 1530   PT Time Calculation (min) 39 min   Behavior During Therapy [facility] for tasks assessed/performed      History reviewed. No pertinent past medical history.  History reviewed. No pertinent surgical history.  There were no vitals filed for this visit.      Subjective Assessment - 06/17/16 1452    Subjective No imbalance issue with the steps but does use sister's HHA   Limitations House hold activities;Walking;Standing   Patient Stated Goals "I want to walk better."    Currently in Pain? No/denies                         [facility] Adult PT Treatment/Exercise - 06/17/16 0001      Ambulation/Gait   Ambulation/Gait Yes   Ambulation/Gait Assistance 5: Supervision   Ambulation/Gait Assistance Details Trialed [facility] for options for community gait. Pt seemed to coordinate sequence better in right hand but towards end of session had greater trouble sequencing   Ambulation Distance (Feet) 200 Feet  +200 feet outside   Assistive device Straight cane   Gait Pattern Step-through pattern;Decreased stride length;Decreased arm swing - right;Decreased arm swing - left;Lateral trunk lean to right;Trunk flexed   Ambulation Surface Level;Unlevel;Indoor;Outdoor;Paved   Ramp 5: Supervision   Ramp Details (indicate cue type and reason) with SPC cues for sequencing.   Curb 4: Min assist   Curb Details (indicate cue type and  reason) with SPC cues for sequencing.             Balance Exercises - 06/17/16 1523      Balance Exercises: Standing   SLS Eyes open;Intermittent upper extremity support  alternate toe taps on 6" block progressing with upright gaze   Step Ups Forward;6 inch;UE support 2  attempting decreased UE support   Sidestepping Other (comment)  over barrier with UE support to simulate stepping in/out of tub.              PT Short Term Goals - 06/15/16 1225      PT SHORT TERM GOAL #1   Title Pt/sister will be independent with initial HEP in order to indicate improved functional mobility and decreased fall risk. (Target Date: 06/19/16)   Time 4   Period Weeks   Status New     PT SHORT TERM GOAL #2   Title Pt will perform 5TSS in </=15 secs without UE support in order to indicate improved BLE strength.     Time 4   Period Weeks   Status New     PT SHORT TERM GOAL #3   Title Will assess gait speed and write appropriate goal.    Time 4   Period Weeks   Status New     PT SHORT TERM GOAL #4   Title Will assess 6MWT and improve distance by 52' in order to indicate improved functional  endurance.     Baseline Inital distance 540' and distance for 06/15/16 895' no AD, no seated rest breaks; Met.   Time 4   Period Weeks   Status Achieved     PT SHORT TERM GOAL #5   Title Pt will improve DGI to 16/24 in order to indicate decreased fall risk.     Baseline DGI 17/24, 06/15/16.   Time 4   Period Weeks   Status Achieved     PT SHORT TERM GOAL #6   Title Pt will ambulate over varying indoor surfaces while scanning environment up to 300' w/ LRAD at mod I level in order to indicate safe home negotiation.    Baseline 300' in home environment Mod I; met ,06/15/16.   Time 4   Period Weeks   Status Achieved           PT Long Term Goals - 05/22/16 2030      PT LONG TERM GOAL #1   Title Pt/sister will be independent with final HEP in order to indicate improved functional mobility  and decreased fall risk (Target Date: 07/17/16)   Time 8   Period Weeks   Status New     PT LONG TERM GOAL #2   Title Pt will negotiate 2 steps, then 1 step without rails at S level in alternating fashion in order to indicate safe entry/exit at home.     Time 8   Period Weeks   Status New     PT LONG TERM GOAL #3   Title Pt will ambulate with gait speed >/= 2.00 ft/sec in order to indicate decreased fall risk and improved efficiency of gait.     Time 8   Period Weeks   Status New     PT LONG TERM GOAL #4   Title Pt will improve DGI to >19/24 in order to indicate decreased fall risk.     Time 8   Period Weeks   Status New     PT LONG TERM GOAL #5   Title Pt will improve 6MWT distance by 150' from baseline in order to indicate improved functional endurance.     Time 8   Period Weeks   Status New     Additional Long Term Goals   Additional Long Term Goals Yes     PT LONG TERM GOAL #6   Title Pt will ambulate up to 500' over unlevel paved surfaces w/ LRAD at mod I level in order to indicate return to community activities.     Time 8   Period Weeks   Status New               Plan - 06/17/16 1536    Clinical Impression Statement Worked on functional tub negotiation stepping over side on tub with 2-1 UE support; pt performed at supervision level.  Trialed SPC for community gait to possibly increase independence; pt continued to require supervison due to  difficulty sequencing and poor balance and lean to the right.                                                Rehab Potential Good   Clinical Impairments Affecting Rehab Potential see past medical history above   PT Frequency 2x / week   PT Duration 8 weeks   PT Treatment/Interventions ADLs/Self Care Home  Management;DME Instruction;Gait training;Stair training;Functional mobility training;Therapeutic activities;Therapeutic exercise;Balance training;Neuromuscular re-education;Cognitive remediation;Patient/family  education;Vestibular;Energy conservation   PT Next Visit Plan Finish checking STGs #1-3 (forgot on 5/16)   gait with head turns/enviromental scanning, balance on complaint surfaces, balance with single leg stance emphasis, compliant surface;   Consulted and Agree with Plan of Care Patient;Family member/caregiver   Family Member Consulted sister, [name]       Patient will benefit from skilled therapeutic intervention in order to improve the following deficits and impairments:  Decreased activity tolerance, Decreased balance, Decreased cognition, Decreased coordination, Decreased endurance, Decreased knowledge of use of DME, Decreased mobility, Decreased strength, Impaired perceived functional ability, Impaired flexibility, Postural dysfunction  Visit Diagnosis: Unsteadiness on feet  Muscle weakness (generalized)  Other abnormalities of gait and mobility     Problem List There are no active problems to display for this patient.   Bjorn Loser, PTA  06/17/16, 3:45 PM [facility] [address] [place], [place], [address] Phone: [phone]   Fax:  [phone]  Name: Bridget Jackson MRN: [id number] Date of Birth: 1950-06-22

## 2016-06-19 DIAGNOSIS — D649 Anemia, unspecified: Secondary | ICD-10-CM | POA: Diagnosis not present

## 2016-06-19 DIAGNOSIS — Z6831 Body mass index (BMI) 31.0-31.9, adult: Secondary | ICD-10-CM | POA: Diagnosis not present

## 2016-06-19 DIAGNOSIS — E669 Obesity, unspecified: Secondary | ICD-10-CM | POA: Diagnosis not present

## 2016-06-19 DIAGNOSIS — N183 Chronic kidney disease, stage 3 (moderate): Secondary | ICD-10-CM | POA: Diagnosis not present

## 2016-06-19 DIAGNOSIS — F259 Schizoaffective disorder, unspecified: Secondary | ICD-10-CM | POA: Diagnosis not present

## 2016-06-19 DIAGNOSIS — Z8673 Personal history of transient ischemic attack (TIA), and cerebral infarction without residual deficits: Secondary | ICD-10-CM | POA: Diagnosis not present

## 2016-06-19 DIAGNOSIS — H409 Unspecified glaucoma: Secondary | ICD-10-CM | POA: Diagnosis not present

## 2016-06-22 ENCOUNTER — Ambulatory Visit: Payer: Medicare Other | Admitting: Physical Therapy

## 2016-06-22 ENCOUNTER — Encounter: Payer: Medicare Other | Admitting: Occupational Therapy

## 2016-06-22 ENCOUNTER — Encounter: Payer: Self-pay | Admitting: Physical Therapy

## 2016-06-22 DIAGNOSIS — R41841 Cognitive communication deficit: Secondary | ICD-10-CM | POA: Diagnosis not present

## 2016-06-22 DIAGNOSIS — R2689 Other abnormalities of gait and mobility: Secondary | ICD-10-CM

## 2016-06-22 DIAGNOSIS — M6281 Muscle weakness (generalized): Secondary | ICD-10-CM | POA: Diagnosis not present

## 2016-06-22 DIAGNOSIS — R2681 Unsteadiness on feet: Secondary | ICD-10-CM | POA: Diagnosis not present

## 2016-06-22 DIAGNOSIS — R41844 Frontal lobe and executive function deficit: Secondary | ICD-10-CM | POA: Diagnosis not present

## 2016-06-22 NOTE — Patient Instructions (Signed)
  06/22/16 Performed with min to no cues as written below with 1 UE support.   ABDUCTION: Standing (Active)     Stand, feet flat. Lift right leg out to side. Complete _1-2__ sets of 10___ repetitions. Perform _1__ sessions per day.  http://gtsc.exer.us/111   EXTENSION: Standing (Active)    Stand, both feet flat. Draw right leg behind body as far as possible. Complete _1-2__ sets of __10_ repetitions. Perform _1__ sessions per day.  http://gtsc.exer.us/77   Copyright  VHI. All rights reserved.  Hip Flexion, Knee Straight    Lift right straight leg forward  Repeat __10__ times per session. Do _1___ sessions per day. Copyright  VHI. All rights reserved.    Hamstring Stretch    Sitting with operated leg straight on bed, and foot of other leg on floor, lean forward toward toes of straight leg. Hold __60__ seconds. Repeat _2-3___ times. Do __2-3__ sessions per day.  http://[email or web address].exer.us/303   Copyright  VHI. All rights reserved.   "I love a Parade" Lift    Stand next to counter top in kitchen or around the dinning table,  March forward slowly to focus on standing on one leg.  When you get to the end of the countertop, I want you to march backwards.  Take big steps and keep your feet apart.   Repeat __1__ lap forwards and 1 lap backwards. Do __1-2__ sessions per day.  http://gt2.exer.us/345   Copyright  VHI. All rights reserved.   Tandem Walking    Walk with each foot directly in front of other, heel of one foot touching toes of other foot with each step. Both feet straight ahead.  Do this around the dinning table forward x 1 lap and backwards x 1 lap.  Repeat 1-2 times per day.     Copyright  VHI. All rights reserved.

## 2016-06-22 NOTE — Therapy (Signed)
[facility] [address] [place], [place], [address] Phone: [phone]   Fax:  [phone]  Physical Therapy Treatment  Patient Details  Name: Bridget Jackson MRN: [id number] Date of Birth: [birth year] Referring Provider: Earline Mayotte, MD  Encounter Date: 06/22/2016      PT End of Session - 06/22/16 1541    Visit Number 10   Number of Visits 17   Date for PT Re-Evaluation 07/21/16   Authorization Type MCR prim, BCBS sec G Code needed every 10th visit   PT Start Time 1450   PT Stop Time 1530   PT Time Calculation (min) 40 min   Activity Tolerance Patient limited by lethargy      History reviewed. No pertinent past medical history.  History reviewed. No pertinent surgical history.  There were no vitals filed for this visit.      Subjective Assessment - 06/22/16 1452    Subjective No falls or balance issues for the steps.   Currently in Pain? No/denies         06/22/16 Performed with min to no cues as written below with 1 UE support.   ABDUCTION: Standing (Active)     Stand, feet flat. Lift right leg out to side. Complete _1-2__ sets of 10___ repetitions. Perform _1__ sessions per day.  http://gtsc.exer.us/111   EXTENSION: Standing (Active)    Stand, both feet flat. Draw right leg behind body as far as possible. Complete _1-2__ sets of __10_ repetitions. Perform _1__ sessions per day.  http://gtsc.exer.us/77   Copyright  VHI. All rights reserved.  Hip Flexion, Knee Straight    Lift right straight leg forward  Repeat __10__ times per session. Do _1___ sessions per day. Copyright  VHI. All rights reserved.    Hamstring Stretch    Sitting with operated leg straight on bed, and foot of other leg on floor, lean forward toward toes of straight leg. Hold __60__ seconds. Repeat _2-3___ times. Do __2-3__ sessions per day.  http://gt2.exer.us/303   Copyright  VHI. All rights  reserved.   "I love a Parade" Lift    Stand next to counter top in kitchen or around the dinning table,  March forward slowly to focus on standing on one leg.  When you get to the end of the countertop, I want you to march backwards.  Take big steps and keep your feet apart.   Repeat __1__ lap forwards and 1 lap backwards. Do __1-2__ sessions per day.  http://gt2.exer.us/345   Copyright  VHI. All rights reserved.   Tandem Walking    Walk with each foot directly in front of other, heel of one foot touching toes of other foot with each step. Both feet straight ahead.  Do this around the dinning table forward x 1 lap and backwards x 1 lap.  Repeat 1-2 times per day.     Copyright  VHI. All rights reserved.                     [facility] Adult PT Treatment/Exercise - 06/22/16 0001      Transfers   Five time sit to stand comments  21.75 secs no UE support      Ambulation/Gait   Ambulation/Gait Yes   Gait velocity 3.89 ft/sec, no AD.             Balance Exercises - 06/22/16 1552      Balance Exercises: Standing   Standing Eyes Opened Wide (BOA);Head turns;Foam/compliant surface  +  anterior/posterior weight shifts through hips.           PT Education - 06/22/16 1526    Education provided Yes   Education Details Performed/reviewed HEP; reviewed goals checked.   Person(s) Educated Patient   Methods Explanation;Verbal cues;Handout   Comprehension Verbalized understanding;Returned demonstration          PT Short Term Goals - 06/22/16 1542      PT SHORT TERM GOAL #1   Title Pt/sister will be independent with initial HEP in order to indicate improved functional mobility and decreased fall risk. (Target Date: 06/19/16)   Baseline Met, 06/22/16.   Time 4   Period Weeks   Status Achieved     PT SHORT TERM GOAL #2   Title Pt will perform 5TSS in </=15 secs without UE support in order to indicate improved BLE strength.     Baseline 5TSS in 21.75 without  UE support.   Time 4   Period Weeks   Status Not Met     PT SHORT TERM GOAL #3   Title Will assess gait speed and write appropriate goal.    Baseline Gait velocity 3.33f/sec (06/22/16) vs initial 2.760fsec (05/27/16).   Time 4   Period Weeks   Status New     PT SHORT TERM GOAL #4   Title Will assess 6MWT and improve distance by 7567in order to indicate improved functional endurance.     Baseline Inital distance 540' and distance for 06/15/16 895' no AD, no seated rest breaks; Met.   Time 4   Period Weeks   Status Achieved     PT SHORT TERM GOAL #5   Title Pt will improve DGI to 16/24 in order to indicate decreased fall risk.     Baseline DGI 17/24, 06/15/16.   Time 4   Period Weeks   Status Achieved     PT SHORT TERM GOAL #6   Title Pt will ambulate over varying indoor surfaces while scanning environment up to 300' w/ LRAD at mod I level in order to indicate safe home negotiation.    Baseline 300' in home environment Mod I; met ,06/15/16.   Time 4   Period Weeks   Status Achieved           PT Long Term Goals - 05/22/16 2030      PT LONG TERM GOAL #1   Title Pt/sister will be independent with final HEP in order to indicate improved functional mobility and decreased fall risk (Target Date: 07/17/16)   Time 8   Period Weeks   Status New     PT LONG TERM GOAL #2   Title Pt will negotiate 2 steps, then 1 step without rails at S level in alternating fashion in order to indicate safe entry/exit at home.     Time 8   Period Weeks   Status New     PT LONG TERM GOAL #3   Title Pt will ambulate with gait speed >/= 2.00 ft/sec in order to indicate decreased fall risk and improved efficiency of gait.     Time 8   Period Weeks   Status New     PT LONG TERM GOAL #4   Title Pt will improve DGI to >19/24 in order to indicate decreased fall risk.     Time 8   Period Weeks   Status New     PT LONG TERM GOAL #5   Title Pt will improve 6MWT distance by 150'  from baseline in  order to indicate improved functional endurance.     Time 8   Period Weeks   Status New     Additional Long Term Goals   Additional Long Term Goals Yes     PT LONG TERM GOAL #6   Title Pt will ambulate up to 500' over unlevel paved surfaces w/ LRAD at mod I level in order to indicate return to community activities.     Time 8   Period Weeks   Status New               Plan - 06/22/16 1546    Clinical Impression Statement Pt met STG #1and 3.  Did not reach STG # 2 but did show progress in getting the same initial time for sit<>stand but without UE support. Progressed standing balance training to static standing with WBOS on compliant surface; pt required intermittent UE support and cues for balance strategies.                                    Rehab Potential Good   Clinical Impairments Affecting Rehab Potential see past medical history above   PT Frequency 2x / week   PT Duration 8 weeks   PT Treatment/Interventions ADLs/Self Care Home Management;DME Instruction;Gait training;Stair training;Functional mobility training;Therapeutic activities;Therapeutic exercise;Balance training;Neuromuscular re-education;Cognitive remediation;Patient/family education;Vestibular;Energy conservation   Consulted and Agree with Plan of Care Patient;Family member/caregiver   Family Member Consulted sister, [name]       Patient will benefit from skilled therapeutic intervention in order to improve the following deficits and impairments:  Decreased activity tolerance, Decreased balance, Decreased cognition, Decreased coordination, Decreased endurance, Decreased knowledge of use of DME, Decreased mobility, Decreased strength, Impaired perceived functional ability, Impaired flexibility, Postural dysfunction  Visit Diagnosis: Unsteadiness on feet  Muscle weakness (generalized)  Other abnormalities of gait and mobility     Gcode entered by:  Cameron Sprang, PT, MPT [facility] [address] [place], [place], [address] Phone: [phone]   Fax:  [phone] 06/25/16, 8:24 PM   Problem List There are no active problems to display for this patient.   Bjorn Loser, PTA  06/25/16, 8:23 PM [facility] [address] [place], [place], [address] Phone: [phone]   Fax:  [phone]  Name: Maysen Bonsignore MRN: [id number] Date of Birth: 1950-10-26

## 2016-06-24 ENCOUNTER — Encounter: Payer: Medicare Other | Admitting: Occupational Therapy

## 2016-06-24 ENCOUNTER — Ambulatory Visit: Payer: Medicare Other

## 2016-06-24 DIAGNOSIS — R2681 Unsteadiness on feet: Secondary | ICD-10-CM | POA: Diagnosis not present

## 2016-06-24 DIAGNOSIS — R2689 Other abnormalities of gait and mobility: Secondary | ICD-10-CM | POA: Diagnosis not present

## 2016-06-24 DIAGNOSIS — R41841 Cognitive communication deficit: Secondary | ICD-10-CM

## 2016-06-24 DIAGNOSIS — R41844 Frontal lobe and executive function deficit: Secondary | ICD-10-CM | POA: Diagnosis not present

## 2016-06-24 DIAGNOSIS — M6281 Muscle weakness (generalized): Secondary | ICD-10-CM | POA: Diagnosis not present

## 2016-06-24 NOTE — Patient Instructions (Signed)
  Please complete the assigned speech therapy homework prior to your next session and return it to the speech therapist at your next visit.  

## 2016-06-24 NOTE — Therapy (Signed)
[facility] [address] [place], [place], [address] Phone: [phone]   Fax:  [phone]  Speech Language Pathology Treatment  Patient Details  Name: Bridget Jackson MRN: [id number] Date of Birth: March 16, 1950 Referring Provider: Dr. Earline Mayotte  Encounter Date: 06/24/2016      End of Session - 06/24/16 1643    Visit Number 9   Number of Visits 17   Date for SLP Re-Evaluation 07/24/16   SLP Start Time 70   SLP Stop Time  1617   SLP Time Calculation (min) 43 min   Activity Tolerance Patient tolerated treatment well      No past medical history on file.  No past surgical history on file.  There were no vitals filed for this visit.      Subjective Assessment - 06/24/16 1533    Subjective Pt showed SLP her new med as of yesterday to put into EPIC. Pt stated her kidney function is stable, and she is a bit anemic - MD added iron as supplement.   Currently in Pain? No/denies               ADULT SLP TREATMENT - 06/24/16 1538      General Information   Behavior/Cognition Alert;Cooperative;Pleasant mood     Treatment Provided   Treatment provided Cognitive-Linquistic     Cognitive-Linquistic Treatment   Treatment focused on Cognition   Skilled Treatment Pt gave SLP her new med for SLP to put into EPIC. In detailed homework, pt's accuracy was 100%. SLP again targeted attention to detail as well as emergnet awareness in tasks today. Pt double checked spontaneously but missed errors on each of three tasks done in session.     Assessment / Recommendations / Plan   Plan Continue with current plan of care     Progression Toward Goals   Progression toward goals Progressing toward goals            SLP Short Term Goals - 06/17/16 1716      SLP SHORT TERM GOAL #1   Title Pt will  alterate attention between 2 simple functional cognitive linguistic tasks with 85% accuracy on each and  occasional min A   Status Achieved     SLP SHORT TERM GOAL #2   Title Pt will solve mildly complex organanization, reasoning, functional math problems with 85% accuracy and rare min A   Status Achieved  85% independently     SLP SHORT TERM GOAL #3   Title Pt will complete mildly complex naming tasks with 90% accuracy and rare min A   Status Achieved     SLP SHORT TERM GOAL #4   Title Pt will utilize external aids to recall appoinments, conversations, lists over 3 sessions with occasional min A   Status Achieved          SLP Long Term Goals - 06/24/16 1649      SLP LONG TERM GOAL #1   Title Pt will divide attention between 2 simple cognitive linguistic tasks with 85% on each and occasional min A   Time 4   Period Weeks   Status On-going     SLP LONG TERM GOAL #2   Title Pt will utilize compensations for word finding episodes with rare min A 80% of opportunities   Time 4   Period Weeks   Status On-going     SLP LONG TERM GOAL #3   Title Pt will identify and self correct errors  on cognitive tasks with occasional min A   Time 4   Period Weeks   Status On-going          Plan - 06/24/16 1644    Clinical Impression Statement 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 However pt had no errors remaining on her homework. Pt is better into habit of double chekcing all work due to decr'd attention to detail but is still dealing wiht emergent awareness deficits. Auditory/mental processing cont to look slower than normal. Skilled ST to maximize cognitive linguistic skills remains necessary for possible return home to live alone.    Speech Therapy Frequency 2x / week   Duration --  8 eweks   Treatment/Interventions Compensatory strategies;Patient/family education;Functional tasks;Cognitive reorganization;Internal/external aids;SLP instruction and  feedback;Cueing hierarchy   Potential to Achieve Goals Good   Potential Considerations Ability to learn/carryover information      Patient will benefit from skilled therapeutic intervention in order to improve the following deficits and impairments:   Cognitive communication deficit    Problem List There are no active problems to display for this patient.   [facility] ,[place], [place]  06/24/2016, 4:50 PM  [facility] [address] [place], [place], [address] Phone: [phone]   Fax:  [phone]   Name: Bridget Jackson MRN: [id number] Date of Birth: [birth year]

## 2016-06-26 ENCOUNTER — Encounter: Payer: Self-pay | Admitting: Rehabilitation

## 2016-06-26 ENCOUNTER — Ambulatory Visit: Payer: Medicare Other | Admitting: Rehabilitation

## 2016-06-26 ENCOUNTER — Ambulatory Visit: Payer: Medicare Other

## 2016-06-26 DIAGNOSIS — R2689 Other abnormalities of gait and mobility: Secondary | ICD-10-CM

## 2016-06-26 DIAGNOSIS — M6281 Muscle weakness (generalized): Secondary | ICD-10-CM | POA: Diagnosis not present

## 2016-06-26 DIAGNOSIS — R2681 Unsteadiness on feet: Secondary | ICD-10-CM | POA: Diagnosis not present

## 2016-06-26 DIAGNOSIS — R41841 Cognitive communication deficit: Secondary | ICD-10-CM | POA: Diagnosis not present

## 2016-06-26 DIAGNOSIS — R41844 Frontal lobe and executive function deficit: Secondary | ICD-10-CM | POA: Diagnosis not present

## 2016-06-26 NOTE — Therapy (Signed)
[facility] [address] [place], [place], [address] Phone: [phone]   Fax:  [phone]  Physical Therapy Treatment  Patient Details  Name: Bridget Jackson MRN: [id number] Date of Birth: [birth year] Referring Provider: Earline Mayotte, MD  Encounter Date: 06/26/2016      PT End of Session - 06/26/16 1540    Visit Number 11   Number of Visits 17   Date for PT Re-Evaluation 07/21/16   Authorization Type MCR prim, BCBS sec G Code needed every 10th visit   PT Start Time 1532   PT Stop Time 1615   PT Time Calculation (min) 43 min   Activity Tolerance Patient tolerated treatment well   Behavior During Therapy [facility] for tasks assessed/performed      History reviewed. No pertinent past medical history.  History reviewed. No pertinent surgical history.  There were no vitals filed for this visit.      Subjective Assessment - 06/26/16 1535    Subjective Reports seeing MD and starting iron supplement.    Limitations House hold activities;Walking;Standing   Patient Stated Goals "I want to walk better."    Currently in Pain? No/denies                         [facility] Adult PT Treatment/Exercise - 06/26/16 0001      Ambulation/Gait   Ambulation/Gait Yes   Ambulation/Gait Assistance 5: Supervision   Ambulation/Gait Assistance Details Continue to work on gait over outdoor paved and grassy surfaces without device today due to trouble with sequencing.  Cues for larger stride and increasing amount of ankle DF and hip/knee flex to fully clear esp in grass.  Pt at close S level.     Ambulation Distance (Feet) 200 Feet  and another 39' indoors    Assistive device None   Gait Pattern Step-through pattern;Decreased stride length;Decreased arm swing - right;Decreased arm swing - left;Lateral trunk lean to right;Trunk flexed   Ambulation Surface Level;Unlevel;Indoor;Outdoor;Paved;Grass     Neuro Re-ed     Neuro Re-ed Details  High level balance with stepping forward/backward over two small balance beams.  Also added slight element of speed to carryover to improved stride length with improved gait speed.  Also performed lateral stepping over 2 barriers.  Both with BUE support but light with cues for posture.  Attempted with single UE support, however noted increased trunk rotation towards R arm.  Performed x 10 reps in each direction.  Worked on maintaining balance on blue foam balance beam x 3 reps of 20 secs progressing from BUE support>single UE support>no UE support.  Progressing to step ups forwards and backwards to balance beam x 10 reps total each direction.  Performed gait around track x 115' with ball toss to work on head motion during gait.  Note that pt tends to vear to the R (already has R lateral lean due to scoliosis).  Requires intermittent min/guard with cues for higher ball toss to increase challenge and head movement.       Exercises   Other Exercises  side stepping with mini squats x 2 laps in // bars down and back with min cues for posture.  Provided lateral glide/stretch to thoracic spine in sitting with PT providing overpressure/counter balance under arm and at thoracic spine.  Performed gentle counter pressure x 10 reps into a grade II mobilization and progressed to having pt lean to the L over small ball and reach RUE  overhead for increased stretch to spine.  Pt tolerated well.                  PT Education - 06/26/16 1536    Education provided Yes   Education Details balance strategies   Person(s) Educated Patient   Methods Explanation   Comprehension Verbalized understanding          PT Short Term Goals - 06/22/16 1542      PT SHORT TERM GOAL #1   Title Pt/sister will be independent with initial HEP in order to indicate improved functional mobility and decreased fall risk. (Target Date: 06/19/16)   Baseline Met, 06/22/16.   Time 4   Period Weeks   Status  Achieved     PT SHORT TERM GOAL #2   Title Pt will perform 5TSS in </=15 secs without UE support in order to indicate improved BLE strength.     Baseline 5TSS in 21.75 without UE support.   Time 4   Period Weeks   Status Not Met     PT SHORT TERM GOAL #3   Title Will assess gait speed and write appropriate goal.    Baseline Gait velocity 3.36f/sec (06/22/16) vs initial 2.753fsec (05/27/16).   Time 4   Period Weeks   Status New     PT SHORT TERM GOAL #4   Title Will assess 6MWT and improve distance by 7565in order to indicate improved functional endurance.     Baseline Inital distance 540' and distance for 06/15/16 895' no AD, no seated rest breaks; Met.   Time 4   Period Weeks   Status Achieved     PT SHORT TERM GOAL #5   Title Pt will improve DGI to 16/24 in order to indicate decreased fall risk.     Baseline DGI 17/24, 06/15/16.   Time 4   Period Weeks   Status Achieved     PT SHORT TERM GOAL #6   Title Pt will ambulate over varying indoor surfaces while scanning environment up to 300' w/ LRAD at mod I level in order to indicate safe home negotiation.    Baseline 300' in home environment Mod I; met ,06/15/16.   Time 4   Period Weeks   Status Achieved           PT Long Term Goals - 06/25/16 2020      PT LONG TERM GOAL #1   Title Pt/sister will be independent with final HEP in order to indicate improved functional mobility and decreased fall risk (Target Date: 07/17/16)   Time 8   Period Weeks   Status New     PT LONG TERM GOAL #2   Title Pt will negotiate 2 steps, then 1 step without rails at S level in alternating fashion in order to indicate safe entry/exit at home.     Time 8   Period Weeks   Status New     PT LONG TERM GOAL #3   Title Pt will ambulate with gait speed >/= 2.00 ft/sec in order to indicate decreased fall risk and improved efficiency of gait.     Baseline met at STG, will defer this goal    Time 8   Period Weeks   Status Deferred     PT  LONG TERM GOAL #4   Title Pt will improve DGI to >19/24 in order to indicate decreased fall risk.     Time 8   Period Weeks   Status New  PT LONG TERM GOAL #5   Title Pt will improve 6MWT distance by 150' from baseline in order to indicate improved functional endurance.     Time 8   Period Weeks   Status New     PT LONG TERM GOAL #6   Title Pt will ambulate up to 500' over unlevel paved surfaces w/ LRAD at mod I level in order to indicate return to community activities.     Time 8   Period Weeks   Status New               Plan - 06/26/16 1625    Clinical Impression Statement Skilled session focused on high level balance with LE strengthening as well as gait with balance challenges and outdoor gait to progress towards LTG.  Pt tends to veer to the R with balance challenges, but tolerated all exercises well.     Rehab Potential Good   Clinical Impairments Affecting Rehab Potential see past medical history above   PT Frequency 2x / week   PT Duration 8 weeks   PT Treatment/Interventions ADLs/Self Care Home Management;DME Instruction;Gait training;Stair training;Functional mobility training;Therapeutic activities;Therapeutic exercise;Balance training;Neuromuscular re-education;Cognitive remediation;Patient/family education;Vestibular;Energy conservation   Consulted and Agree with Plan of Care Patient;Family member/caregiver   Family Member Consulted sister, [name]       Patient will benefit from skilled therapeutic intervention in order to improve the following deficits and impairments:  Decreased activity tolerance, Decreased balance, Decreased cognition, Decreased coordination, Decreased endurance, Decreased knowledge of use of DME, Decreased mobility, Decreased strength, Impaired perceived functional ability, Impaired flexibility, Postural dysfunction  Visit Diagnosis: Unsteadiness on feet  Muscle weakness (generalized)  Other abnormalities of gait and  mobility     Problem List There are no active problems to display for this patient.   Cameron Sprang, PT, MPT [facility] [address] [place], [place], [address] Phone: [phone]   Fax:  [phone] 06/26/16, 4:29 PM  Name: Bridget Jackson MRN: [id number] Date of Birth: [birth year]

## 2016-06-26 NOTE — Patient Instructions (Signed)
  Please complete the assigned speech therapy homework prior to your next session and return it to the speech therapist at your next visit.  

## 2016-06-26 NOTE — Therapy (Signed)
[facility] [address] [place], [place], [address] Phone: [phone]   Fax:  [phone]  Speech Language Pathology Treatment  Patient Details  Name: Bridget Jackson MRN: [id number] Date of Birth: Aug 01, 1950 Referring Provider: Dr. Earline Mayotte  Encounter Date: 06/26/2016      End of Session - 06/26/16 1639    Visit Number 10   Number of Visits 17   Date for SLP Re-Evaluation 07/24/16   SLP Start Time 1449   SLP Stop Time  1530   SLP Time Calculation (min) 41 min   Activity Tolerance Patient tolerated treatment well      No past medical history on file.  No past surgical history on file.  There were no vitals filed for this visit.      Subjective Assessment - 06/26/16 1454    Subjective Pt provided homework to SLP upon entering room.               ADULT SLP TREATMENT - 06/26/16 1459      General Information   Behavior/Cognition Alert;Cooperative;Pleasant mood     Treatment Provided   Treatment provided Cognitive-Linquistic     Cognitive-Linquistic Treatment   Treatment focused on Cognition   Skilled Treatment Pt again provided a new med to SLP. SLP confirmed it was already in EPIC. Pt's alternating attention and attention to detail were targeted in check writing task today. Pt took 28 minutes for 7 entries. 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  She was unable to hold simple conversation with SLP and cont to work on Estate agent task.      Assessment / Recommendations / Plan   Plan Continue with current plan of care     Progression Toward Goals   Progression toward goals Progressing toward goals            SLP Short Term Goals - 06/17/16 1716      SLP SHORT TERM GOAL #1   Title Pt will  alterate attention between 2 simple functional cognitive linguistic tasks with 85% accuracy on  each and occasional min A   Status Achieved     SLP SHORT TERM GOAL #2   Title Pt will solve mildly complex organanization, reasoning, functional math problems with 85% accuracy and rare min A   Status Achieved  85% independently     SLP SHORT TERM GOAL #3   Title Pt will complete mildly complex naming tasks with 90% accuracy and rare min A   Status Achieved     SLP SHORT TERM GOAL #4   Title Pt will utilize external aids to recall appoinments, conversations, lists over 3 sessions with occasional min A   Status Achieved          SLP Long Term Goals - 06/26/16 1642      SLP LONG TERM GOAL #1   Title Pt will divide attention between 2 simple cognitive linguistic tasks with 85% on each and occasional min A   Time 4   Period Weeks   Status On-going     SLP LONG TERM GOAL #2   Title Pt will utilize compensations for word finding episodes with rare min A 80% of opportunities   Time 4   Period Weeks   Status On-going     SLP LONG TERM GOAL #3   Title Pt will identify and self correct errors on  cognitive tasks with occasional min A over three sessions   Baseline 07/13/2016   Time 4   Period Weeks   Status Revised          Plan - 07-13-2016 1639    Clinical Impression Statement 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 However pt double checked her work and had no errors. Auditory/mental processing cont to look slower than normal and pt showed intellectual awareness of these deficits today. Skilled ST to maximize cognitive linguistic skills remains necessary for possible return home to live alone.    Speech Therapy Frequency 2x / week   Duration --  8 eweks   Treatment/Interventions Compensatory strategies;Patient/family education;Functional tasks;Cognitive reorganization;Internal/external aids;SLP instruction and feedback;Cueing hierarchy   Potential to Achieve  Goals Good   Potential Considerations Ability to learn/carryover information      Patient will benefit from skilled therapeutic intervention in order to improve the following deficits and impairments:   Cognitive communication deficit      G-Codes - 13-Jul-2016 1643    Functional Assessment Tool Used NOMS   Functional Limitations Attention   Attention Current Status (P9150) At least 20 percent but less than 40 percent impaired, limited or restricted   Attention Goal Status (V6979) At least 1 percent but less than 20 percent impaired, limited or restricted     Speech Therapy Progress Note  Dates of Reporting Period: 05-26-16 to present  Subjective Statement: Pt has been seen for 10 ST visits focusing mainly on cognitive ocmmunication goals in attention, processing in spoken and written language tasks.  Objective Measurements: Pt requires SLP A for higher level cognitive linguistic skills in more complex tasks  Goal Update: See above  Plan: See above  Reason Skilled Services are Required: Pt is not ready to return home without supervision.  Problem List There are no active problems to display for this patient.   [facility] 07/13/16, 4:44 PM  [facility] [address] [place], [place], [address] Phone: [phone]   Fax:  [phone]   Name: Bridget Jackson MRN: [id number] Date of Birth: Jun 08, 1950

## 2016-06-30 ENCOUNTER — Ambulatory Visit: Payer: Medicare Other | Admitting: Speech Pathology

## 2016-06-30 DIAGNOSIS — R41844 Frontal lobe and executive function deficit: Secondary | ICD-10-CM | POA: Diagnosis not present

## 2016-06-30 DIAGNOSIS — R2689 Other abnormalities of gait and mobility: Secondary | ICD-10-CM | POA: Diagnosis not present

## 2016-06-30 DIAGNOSIS — R2681 Unsteadiness on feet: Secondary | ICD-10-CM | POA: Diagnosis not present

## 2016-06-30 DIAGNOSIS — M6281 Muscle weakness (generalized): Secondary | ICD-10-CM | POA: Diagnosis not present

## 2016-06-30 DIAGNOSIS — R41841 Cognitive communication deficit: Secondary | ICD-10-CM

## 2016-06-30 NOTE — Therapy (Signed)
[facility] [address] [place], [place], [address] Phone: [phone]   Fax:  [phone]  Speech Language Pathology Treatment  Patient Details  Name: Bridget Jackson MRN: [id number] Date of Birth: 12/07/1950 Referring Provider: Dr. Earline Mayotte  Encounter Date: 06/30/2016      End of Session - 06/30/16 1448    Visit Number 11   Number of Visits 17   Date for SLP Re-Evaluation 07/24/16   SLP Start Time 2878   SLP Stop Time  1446   SLP Time Calculation (min) 44 min   Activity Tolerance Patient tolerated treatment well      No past medical history on file.  No past surgical history on file.  There were no vitals filed for this visit.      Subjective Assessment - 06/30/16 1404    Subjective "I don't think I've seen you before"                ADULT SLP TREATMENT - 06/30/16 1404      General Information   Behavior/Cognition Alert;Cooperative;Pleasant mood     Treatment Provided   Treatment provided Cognitive-Linquistic     Cognitive-Linquistic Treatment   Treatment focused on Cognition   Skilled Treatment Reviewed homework - pt had put the bank on her schedule twice - otherwise, homework accurate. Divided  attention between simple written word finding task and auditory/verbal money counting task with slow proecessing/extended time, however pt rarely required repeition.  Pt required 1 redirection back to written task.      Assessment / Recommendations / Plan   Plan Continue with current plan of care     Progression Toward Goals   Progression toward goals Progressing toward goals          SLP Education - 06/30/16 1438    Education provided Yes   Education Details continue ST for at least 6 more visits or 3 weeks with re-assessment at that time -    Person(s) Educated Patient   Methods Explanation   Comprehension Verbalized understanding          SLP Short Term Goals -  06/30/16 1448      SLP SHORT TERM GOAL #1   Title Pt will  alterate attention between 2 simple functional cognitive linguistic tasks with 85% accuracy on each and occasional min A   Status Achieved     SLP SHORT TERM GOAL #2   Title Pt will solve mildly complex organanization, reasoning, functional math problems with 85% accuracy and rare min A   Status Achieved  85% independently     SLP SHORT TERM GOAL #3   Title Pt will complete mildly complex naming tasks with 90% accuracy and rare min A   Status Achieved     SLP SHORT TERM GOAL #4   Title Pt will utilize external aids to recall appoinments, conversations, lists over 3 sessions with occasional min A   Status Achieved          SLP Long Term Goals - 06/30/16 1448      SLP LONG TERM GOAL #1   Title Pt will divide attention between 2 simple cognitive linguistic tasks with 85% on each and occasional min A   Time 3   Period Weeks   Status On-going     SLP LONG TERM GOAL #2   Title Pt will utilize compensations for word finding episodes with rare min A 80% of opportunities   Time 3  Period Weeks   Status On-going     SLP LONG TERM GOAL #3   Title Pt will identify and self correct errors on cognitive tasks with occasional min A over three sessions   Baseline 06-26-16   Time 3   Period Weeks   Status Revised          Plan - 06/30/16 1441    Clinical Impression Statement Pt continues to require extended time for slow processing and SLP A for attention and error awareness.  Continue skilled ST to maximize cognition for possible return to her home in [facility] to live alone (other sister is there to provide support)   Speech Therapy Frequency 2x / week   Treatment/Interventions Compensatory strategies;Patient/family education;Functional tasks;Cognitive reorganization;Internal/external aids;SLP instruction and feedback;Cueing hierarchy   Potential to Achieve Goals Good   Potential Considerations Ability to  learn/carryover information   Consulted and Agree with Plan of Care Patient      Patient will benefit from skilled therapeutic intervention in order to improve the following deficits and impairments:   Cognitive communication deficit    Problem List There are no active problems to display for this patient.   Winnie Umali, Annye Rusk MS, CCC-SLP 06/30/2016, 2:49 PM  [facility] [address] [place], [place], [address] Phone: [phone]   Fax:  [phone]   Name: Bridget Jackson MRN: [id number] Date of Birth: July 20, 1950

## 2016-07-01 ENCOUNTER — Ambulatory Visit: Payer: Medicare Other

## 2016-07-01 ENCOUNTER — Encounter: Payer: Self-pay | Admitting: Physical Therapy

## 2016-07-01 ENCOUNTER — Ambulatory Visit: Payer: Medicare Other | Admitting: Physical Therapy

## 2016-07-01 DIAGNOSIS — M6281 Muscle weakness (generalized): Secondary | ICD-10-CM

## 2016-07-01 DIAGNOSIS — R41841 Cognitive communication deficit: Secondary | ICD-10-CM | POA: Diagnosis not present

## 2016-07-01 DIAGNOSIS — R2689 Other abnormalities of gait and mobility: Secondary | ICD-10-CM | POA: Diagnosis not present

## 2016-07-01 DIAGNOSIS — R2681 Unsteadiness on feet: Secondary | ICD-10-CM | POA: Diagnosis not present

## 2016-07-01 DIAGNOSIS — R41844 Frontal lobe and executive function deficit: Secondary | ICD-10-CM | POA: Diagnosis not present

## 2016-07-01 NOTE — Therapy (Signed)
[facility] [address] [place], [place], [address] Phone: [phone]   Fax:  [phone]  Physical Therapy Treatment  Patient Details  Name: Bridget Jackson MRN: [id number] Date of Birth: April 30, 1950 Referring Provider: Earline Mayotte, MD  Encounter Date: 07/01/2016      PT End of Session - 07/01/16 1536    Visit Number 12   Number of Visits 17   Date for PT Re-Evaluation 07/21/16   Authorization Type MCR prim, BCBS sec G Code needed every 10th visit   PT Start Time 3382   PT Stop Time 1525   PT Time Calculation (min) 38 min   Equipment Utilized During Treatment Gait belt   Activity Tolerance Patient tolerated treatment well   Behavior During Therapy [facility] for tasks assessed/performed      History reviewed. No pertinent past medical history.  History reviewed. No pertinent surgical history.  There were no vitals filed for this visit.      Subjective Assessment - 07/01/16 1449    Subjective Pt has been working on trunk stretch since last visit.   Limitations House hold activities;Walking;Standing   Patient Stated Goals "I want to walk better."    Currently in Pain? No/denies                         [facility] Adult PT Treatment/Exercise - 07/01/16 0001      Ambulation/Gait   Stairs Yes   Stairs Assistance 5: Supervision   Stairs Assistance Details (indicate cue type and reason) Working on reciprocal pattern for coordination and strengthening.   Stair Management Technique Two rails;Alternating pattern   Number of Stairs 4  x4   Height of Stairs 6             Balance Exercises - 07/01/16 1450      Balance Exercises: Standing   Stepping Strategy Anterior;Foam/compliant surface  Progressing from 2-intermittent UE support   Other Standing Exercises Multidirectional stepping;progressing with weight shifts and upright gaze, min guard             PT Short Term Goals -  06/22/16 1542      PT SHORT TERM GOAL #1   Title Pt/sister will be independent with initial HEP in order to indicate improved functional mobility and decreased fall risk. (Target Date: 06/19/16)   Baseline Met, 06/22/16.   Time 4   Period Weeks   Status Achieved     PT SHORT TERM GOAL #2   Title Pt will perform 5TSS in </=15 secs without UE support in order to indicate improved BLE strength.     Baseline 5TSS in 21.75 without UE support.   Time 4   Period Weeks   Status Not Met     PT SHORT TERM GOAL #3   Title Will assess gait speed and write appropriate goal.    Baseline Gait velocity 3.22f/sec (06/22/16) vs initial 2.778fsec (05/27/16).   Time 4   Period Weeks   Status New     PT SHORT TERM GOAL #4   Title Will assess 6MWT and improve distance by 7529in order to indicate improved functional endurance.     Baseline Inital distance 540' and distance for 06/15/16 895' no AD, no seated rest breaks; Met.   Time 4   Period Weeks   Status Achieved     PT SHORT TERM GOAL #5   Title Pt will improve DGI to 16/24 in order to  indicate decreased fall risk.     Baseline DGI 17/24, 06/15/16.   Time 4   Period Weeks   Status Achieved     PT SHORT TERM GOAL #6   Title Pt will ambulate over varying indoor surfaces while scanning environment up to 300' w/ LRAD at mod I level in order to indicate safe home negotiation.    Baseline 300' in home environment Mod I; met ,06/15/16.   Time 4   Period Weeks   Status Achieved           PT Long Term Goals - 06/25/16 2020      PT LONG TERM GOAL #1   Title Pt/sister will be independent with final HEP in order to indicate improved functional mobility and decreased fall risk (Target Date: 07/17/16)   Time 8   Period Weeks   Status New     PT LONG TERM GOAL #2   Title Pt will negotiate 2 steps, then 1 step without rails at S level in alternating fashion in order to indicate safe entry/exit at home.     Time 8   Period Weeks   Status New      PT LONG TERM GOAL #3   Title Pt will ambulate with gait speed >/= 2.00 ft/sec in order to indicate decreased fall risk and improved efficiency of gait.     Baseline met at STG, will defer this goal    Time 8   Period Weeks   Status Deferred     PT LONG TERM GOAL #4   Title Pt will improve DGI to >19/24 in order to indicate decreased fall risk.     Time 8   Period Weeks   Status New     PT LONG TERM GOAL #5   Title Pt will improve 6MWT distance by 150' from baseline in order to indicate improved functional endurance.     Time 8   Period Weeks   Status New     PT LONG TERM GOAL #6   Title Pt will ambulate up to 500' over unlevel paved surfaces w/ LRAD at mod I level in order to indicate return to community activities.     Time 8   Period Weeks   Status New               Plan - 07/01/16 1537    Clinical Impression Statement Progressed coordination and strength activity with stair training practising alternating pattern; pt requires 2 hand support and supervison for cues with safe technique. Pt able to perform SLS and multidirectional stepping with min guard.   Rehab Potential Good   Clinical Impairments Affecting Rehab Potential see past medical history above   PT Frequency 2x / week   PT Duration 8 weeks   PT Treatment/Interventions ADLs/Self Care Home Management;DME Instruction;Gait training;Stair training;Functional mobility training;Therapeutic activities;Therapeutic exercise;Balance training;Neuromuscular re-education;Cognitive remediation;Patient/family education;Vestibular;Energy conservation   PT Next Visit Plan gait with head turns/enviromental scanning, balance on complaint surfaces, balance with single leg stance emphasis,    Consulted and Agree with Plan of Care Patient;Family member/caregiver   Family Member Consulted sister, Bridget Jackson       Patient will benefit from skilled therapeutic intervention in order to improve the following deficits and impairments:   Decreased activity tolerance, Decreased balance, Decreased cognition, Decreased coordination, Decreased endurance, Decreased knowledge of use of DME, Decreased mobility, Decreased strength, Impaired perceived functional ability, Impaired flexibility, Postural dysfunction  Visit Diagnosis: Unsteadiness on feet  Muscle weakness (generalized)  Other abnormalities of gait and mobility     Problem List There are no active problems to display for this patient.  Bjorn Loser, PTA  07/01/16, 3:41 PM [facility] [address] [place], [place], [address] Phone: [phone]   Fax:  [phone]  Name: Bridget Jackson MRN: [id number] Date of Birth: 1950/11/19

## 2016-07-01 NOTE — Patient Instructions (Signed)
   Divided Attention ideas - any can be done together  Playing a card game either by yourself or with someone  Playing a board game with someone  Sorting playing cards  Writing down commercials on TV or the radio  Writing down news stories on TV or radio  Working outside with yard work, gardening, etc.  Washing dishes  Recite state names and capitals  Complete long division or 2 or 3 digit multiplication problems  Listen to a speech online (i.e., YouTube), to online news (NPR, CNN, etc.), or to a family member, and give pertinent information from the speech, news story, or conversation after it is over  Empty the dishwasher  Complete paper/pencil puzzles (sudoku, crosswords, word search)  Complete puzzles online (www.lumosity.com -computer/tablet based, or www.constanttherapy.com - iPad based)  Therapy worksheets/exercises  Put together a puzzle  State coins needed to make a certain change amount  Have someone read a portion of a novel to you, and you give a summary    Start at a level that you are challenged by, but also where you can see some success  

## 2016-07-01 NOTE — Therapy (Signed)
[facility] [address] [place], [place], [address] Phone: [phone]   Fax:  [phone]  Speech Language Pathology Treatment  Patient Details  Name: Bridget Jackson MRN: [id number] Date of Birth: [birth year] Referring Provider: Dr. Earline Mayotte  Encounter Date: 07/01/2016      End of Session - 07/01/16 1630    Visit Number 12   Number of Visits 17   Date for SLP Re-Evaluation 07/24/16   SLP Start Time 0076   SLP Stop Time  1616   SLP Time Calculation (min) 43 min   Activity Tolerance Patient tolerated treatment well      No past medical history on file.  No past surgical history on file.  There were no vitals filed for this visit.      Subjective Assessment - 07/01/16 1548    Subjective "I saw Mickel Baas." (re: SLP name that she saw yesterday)   Currently in Pain? No/denies               ADULT SLP TREATMENT - 07/01/16 1549      General Information   Behavior/Cognition Alert;Cooperative;Pleasant mood     Treatment Provided   Treatment provided Cognitive-Linquistic     Cognitive-Linquistic Treatment   Treatment focused on Cognition   Skilled Treatment Pt reported it took her 30-35 minutes to do [place] deductive reasoning. In divided attention tasks, pt req'd usual min-mod nonverbal cues (SLP tapping appropriate paper as cue). Pt demo'd slower processing time and in auditory task/written task pt demo'd alternating attention, primarily (80% alternating/20% divided). SLP had to cue pt to return to primary task afte writing commercial 25% of the time. Error awareness was limited and req'd SLP A rarely.     Assessment / Recommendations / Plan   Plan Continue with current plan of care     Progression Toward Goals   Progression toward goals Progressing toward goals          SLP Education - 06/30/16 1438    Education provided Yes   Education Details continue ST for at least 6 more visits or  3 weeks with re-assessment at that time -    Person(s) Educated Patient   Methods Explanation   Comprehension Verbalized understanding          SLP Short Term Goals - 07/01/16 1633      SLP SHORT TERM GOAL #1   Title Pt will  alterate attention between 2 simple functional cognitive linguistic tasks with 85% accuracy on each and occasional min A   Status Achieved     SLP SHORT TERM GOAL #2   Title Pt will solve mildly complex organanization, reasoning, functional math problems with 85% accuracy and rare min A   Status Achieved  85% independently     SLP SHORT TERM GOAL #3   Title Pt will complete mildly complex naming tasks with 90% accuracy and rare min A   Status Achieved     SLP SHORT TERM GOAL #4   Title Pt will utilize external aids to recall appoinments, conversations, lists over 3 sessions with occasional min A   Status Achieved          SLP Long Term Goals - 07/01/16 1633      SLP LONG TERM GOAL #1   Title Pt will divide attention between 2 simple cognitive linguistic tasks with 85% on each and occasional min A   Time 3   Period Weeks   Status On-going  SLP LONG TERM GOAL #2   Title Pt will utilize compensations for word finding episodes with rare min A 80% of opportunities   Time 3   Period Weeks   Status On-going     SLP LONG TERM GOAL #3   Title Pt will identify and self correct errors on cognitive tasks with occasional min A over three sessions   Baseline 06-26-16, 07-01-16   Time 3   Period Weeks   Status On-going          Plan - 07/01/16 1630    Clinical Impression Statement Pt continues to require extended time for slow processing and SLP A for attention and error awareness, although it appears error awareness is improving slightly.  Continue skilled ST to maximize cognition for possible return to her home in [facility] to live alone (other sister is there to provide support)   Speech Therapy Frequency 2x / week   Duration --  8 weeks    Treatment/Interventions Compensatory strategies;Patient/family education;Functional tasks;Cognitive reorganization;Internal/external aids;SLP instruction and feedback;Cueing hierarchy   Potential to Achieve Goals Good   Potential Considerations Ability to learn/carryover information   Consulted and Agree with Plan of Care Patient      Patient will benefit from skilled therapeutic intervention in order to improve the following deficits and impairments:   Cognitive communication deficit    Problem List There are no active problems to display for this patient.   [facility] ,[place], [place]  07/01/2016, 4:34 PM  [facility] [address] [place], [place], [address] Phone: [phone]   Fax:  [phone]   Name: Bridget Jackson MRN: [id number] Date of Birth: December 05, 1950

## 2016-07-03 ENCOUNTER — Ambulatory Visit: Payer: Medicare Other | Attending: Family Medicine | Admitting: Rehabilitation

## 2016-07-03 DIAGNOSIS — R41841 Cognitive communication deficit: Secondary | ICD-10-CM | POA: Insufficient documentation

## 2016-07-03 DIAGNOSIS — M6281 Muscle weakness (generalized): Secondary | ICD-10-CM

## 2016-07-03 DIAGNOSIS — R2681 Unsteadiness on feet: Secondary | ICD-10-CM | POA: Diagnosis not present

## 2016-07-03 DIAGNOSIS — R2689 Other abnormalities of gait and mobility: Secondary | ICD-10-CM | POA: Diagnosis not present

## 2016-07-03 NOTE — Patient Instructions (Signed)
For all these exercises: stand in corner with chair in front of you for support if needed.     Feet Together, Head Motion - Eyes Closed    With eyes closed and feet together, move head slowly, up and down x 10 reps, side to side x 10 reps Do __1-2__ sessions per day.  Copyright  VHI. All rights reserved.   Feet Apart (Compliant Surface) Varied Arm Positions - Eyes Closed    Stand on compliant surface: __pillow or cushion______ with feet shoulder width apart and keep arms by your side. Close eyes and visualize upright position. Hold__30__ seconds. Repeat __3__ times per session. Do __1-2__ sessions per day.  Copyright  VHI. All rights reserved.    Feet Together (Compliant Surface) Varied Arm Positions - Eyes Closed    Stand on compliant surface: _pillow______ with feet together and arms out. Close eyes and visualize upright position. Hold__15__ seconds. Repeat _3___ times per session. Do __1-2__ sessions per day.  Copyright  VHI. All rights reserved.

## 2016-07-03 NOTE — Therapy (Signed)
[facility] [address] [place], [place], [address] Phone: [phone]   Fax:  [phone]  Physical Therapy Treatment  Patient Details  Name: Bridget Jackson MRN: [id number] Date of Birth: 04-Dec-1950 Referring Provider: Earline Mayotte, MD  Encounter Date: 07/03/2016      PT End of Session - 07/03/16 1400    Visit Number 13   Number of Visits 17   Date for PT Re-Evaluation 07/21/16   Authorization Type MCR prim, BCBS sec G Code needed every 10th visit   PT Start Time 1400   PT Stop Time 1446   PT Time Calculation (min) 46 min   Equipment Utilized During Treatment Gait belt   Activity Tolerance Patient tolerated treatment well   Behavior During Therapy [facility] for tasks assessed/performed      No past medical history on file.  No past surgical history on file.  There were no vitals filed for this visit.      Subjective Assessment - 07/03/16 1400    Subjective No changes, no falls.    Limitations House hold activities;Walking;Standing   Patient Stated Goals "I want to walk better."    Currently in Pain? No/denies                         [facility] Adult PT Treatment/Exercise - 07/03/16 0001      Self-Care   Self-Care Other Self-Care Comments   Other Self-Care Comments  Pt with question regarding shoes during session.  Recommended no specific brand, but recommended lace up shoes with thicker sole for more support.  Pt verbalized understanding.       Neuro Re-ed    Neuro Re-ed Details  Worked on high level balance in corner to add to HEP.  Performed feet apart EC x 30 secs>feet together EC x 30 secs>feet together EC w/ Head motion side to side and up/down x 10 reps each and then diagonally x 10 reps each direction.  Provided for HEP, see pt instruciton.  Then added compliant surface and performed feet apart EC x 30 secs>feet together EC x 2 sets of 30 secs, added to HEP.  Transitioned to  standing on blue foam mat on ramp surface with feet staggered holding balance x 2 sets of 30 secs on each side at min/guard to close S level with cues for upward/forward gaze.  Marching in place x 2 sets of 10 reps with min A and cues for larger steps and increased step width.                  PT Education - 07/03/16 1628    Education provided Yes   Education Details additional exercises for HEP   Person(s) Educated Patient   Methods Explanation;Demonstration;Handout   Comprehension Verbalized understanding;Returned demonstration          PT Short Term Goals - 06/22/16 1542      PT SHORT TERM GOAL #1   Title Pt/sister will be independent with initial HEP in order to indicate improved functional mobility and decreased fall risk. (Target Date: 06/19/16)   Baseline Met, 06/22/16.   Time 4   Period Weeks   Status Achieved     PT SHORT TERM GOAL #2   Title Pt will perform 5TSS in </=15 secs without UE support in order to indicate improved BLE strength.     Baseline 5TSS in 21.75 without UE support.   Time 4   Period Weeks  Status Not Met     PT SHORT TERM GOAL #3   Title Will assess gait speed and write appropriate goal.    Baseline Gait velocity 3.54f/sec (06/22/16) vs initial 2.753fsec (05/27/16).   Time 4   Period Weeks   Status New     PT SHORT TERM GOAL #4   Title Will assess 6MWT and improve distance by 7576in order to indicate improved functional endurance.     Baseline Inital distance 540' and distance for 06/15/16 895' no AD, no seated rest breaks; Met.   Time 4   Period Weeks   Status Achieved     PT SHORT TERM GOAL #5   Title Pt will improve DGI to 16/24 in order to indicate decreased fall risk.     Baseline DGI 17/24, 06/15/16.   Time 4   Period Weeks   Status Achieved     PT SHORT TERM GOAL #6   Title Pt will ambulate over varying indoor surfaces while scanning environment up to 300' w/ LRAD at mod I level in order to indicate safe home negotiation.     Baseline 300' in home environment Mod I; met ,06/15/16.   Time 4   Period Weeks   Status Achieved           PT Long Term Goals - 06/25/16 2020      PT LONG TERM GOAL #1   Title Pt/sister will be independent with final HEP in order to indicate improved functional mobility and decreased fall risk (Target Date: 07/17/16)   Time 8   Period Weeks   Status New     PT LONG TERM GOAL #2   Title Pt will negotiate 2 steps, then 1 step without rails at S level in alternating fashion in order to indicate safe entry/exit at home.     Time 8   Period Weeks   Status New     PT LONG TERM GOAL #3   Title Pt will ambulate with gait speed >/= 2.00 ft/sec in order to indicate decreased fall risk and improved efficiency of gait.     Baseline met at STG, will defer this goal    Time 8   Period Weeks   Status Deferred     PT LONG TERM GOAL #4   Title Pt will improve DGI to >19/24 in order to indicate decreased fall risk.     Time 8   Period Weeks   Status New     PT LONG TERM GOAL #5   Title Pt will improve 6MWT distance by 150' from baseline in order to indicate improved functional endurance.     Time 8   Period Weeks   Status New     PT LONG TERM GOAL #6   Title Pt will ambulate up to 500' over unlevel paved surfaces w/ LRAD at mod I level in order to indicate return to community activities.     Time 8   Period Weeks   Status New               Plan - 07/03/16 1628    Clinical Impression Statement Skilled session focused on addition of corner balance tasks to work on vestibular challenges with compliant surfaces, EC and head motion.  Pt tolerated well and added exercises as able, see pt instruction.     Rehab Potential Good   Clinical Impairments Affecting Rehab Potential see past medical history above   PT Frequency 2x / week  PT Duration 8 weeks   PT Treatment/Interventions ADLs/Self Care Home Management;DME Instruction;Gait training;Stair training;Functional mobility  training;Therapeutic activities;Therapeutic exercise;Balance training;Neuromuscular re-education;Cognitive remediation;Patient/family education;Vestibular;Energy conservation   PT Next Visit Plan gait with head turns/enviromental scanning, balance on complaint surfaces, balance with single leg stance emphasis,    Consulted and Agree with Plan of Care Patient      Patient will benefit from skilled therapeutic intervention in order to improve the following deficits and impairments:  Decreased activity tolerance, Decreased balance, Decreased cognition, Decreased coordination, Decreased endurance, Decreased knowledge of use of DME, Decreased mobility, Decreased strength, Impaired perceived functional ability, Impaired flexibility, Postural dysfunction  Visit Diagnosis: Unsteadiness on feet  Muscle weakness (generalized)  Other abnormalities of gait and mobility     Problem List There are no active problems to display for this patient.   [place]on Sprang, PT, MPT [facility] [address] [place], [place], [address] Phone: [phone]   Fax:  [phone] 07/03/16, 4:30 PM  Name: Falisa Lamora MRN: [id number] Date of Birth: [birth year]

## 2016-07-08 ENCOUNTER — Ambulatory Visit: Payer: Medicare Other

## 2016-07-08 ENCOUNTER — Encounter: Payer: Self-pay | Admitting: Physical Therapy

## 2016-07-08 ENCOUNTER — Ambulatory Visit: Payer: Medicare Other | Admitting: Physical Therapy

## 2016-07-08 DIAGNOSIS — M6281 Muscle weakness (generalized): Secondary | ICD-10-CM | POA: Diagnosis not present

## 2016-07-08 DIAGNOSIS — R2689 Other abnormalities of gait and mobility: Secondary | ICD-10-CM

## 2016-07-08 DIAGNOSIS — R41841 Cognitive communication deficit: Secondary | ICD-10-CM

## 2016-07-08 DIAGNOSIS — R2681 Unsteadiness on feet: Secondary | ICD-10-CM | POA: Diagnosis not present

## 2016-07-08 NOTE — Patient Instructions (Signed)
Continue to do divided attention tasks TOGETHER at home

## 2016-07-08 NOTE — Therapy (Signed)
[facility] [address] [place], [place], [address] Phone: [phone]   Fax:  [phone]  Speech Language Pathology Treatment  Patient Details  Name: Bridget Jackson MRN: [id number] Date of Birth: 1950-02-13 Referring Provider: Dr. Earline Mayotte  Encounter Date: 07/08/2016      End of Session - 07/08/16 1445    Visit Number 13   Number of Visits 17   Date for SLP Re-Evaluation 07/24/16   SLP Start Time 5397   SLP Stop Time  1446   SLP Time Calculation (min) 41 min   Activity Tolerance Patient tolerated treatment well      No past medical history on file.  No past surgical history on file.  There were no vitals filed for this visit.      Subjective Assessment - 07/08/16 1410    Subjective Pt arrives with divided attention tasks checked off, which she completed since last week.   Currently in Pain? No/denies               ADULT SLP TREATMENT - 07/08/16 1412      General Information   Behavior/Cognition Alert;Cooperative;Pleasant mood     Treatment Provided   Treatment provided Cognitive-Linquistic     Cognitive-Linquistic Treatment   Treatment focused on Cognition   Skilled Treatment Pt cont to use agenda to track appointments, events, etc she states with excellent success. SLP reiterated divided attention homework  were supposed to be done together. Pt and SLP performed a turn taking task and had conversation (simple, light) simultaneously - pt engaged in alternating attenion 100% of the time, and req'd cues for her turn 25% of the time whether in conversation or not in conversation.      Assessment / Recommendations / Plan   Plan Continue with current plan of care     Progression Toward Goals   Progression toward goals Progressing toward goals          SLP Education - 07/08/16 1445    Education provided Yes   Education Details divided attention at home   Person(s) Educated  Patient   Methods Explanation   Comprehension Verbalized understanding;Verbal cues required;Need further instruction          SLP Short Term Goals - 07/01/16 1633      SLP SHORT TERM GOAL #1   Title Pt will  alterate attention between 2 simple functional cognitive linguistic tasks with 85% accuracy on each and occasional min A   Status Achieved     SLP SHORT TERM GOAL #2   Title Pt will solve mildly complex organanization, reasoning, functional math problems with 85% accuracy and rare min A   Status Achieved  85% independently     SLP SHORT TERM GOAL #3   Title Pt will complete mildly complex naming tasks with 90% accuracy and rare min A   Status Achieved     SLP SHORT TERM GOAL #4   Title Pt will utilize external aids to recall appoinments, conversations, lists over 3 sessions with occasional min A   Status Achieved          SLP Long Term Goals - 07/08/16 1447      SLP LONG TERM GOAL #1   Title Pt will divide attention between 2 simple cognitive linguistic tasks with 85% on each and occasional min A   Time 2   Period Weeks   Status On-going     SLP LONG TERM GOAL #2  Title Pt will utilize compensations for word finding episodes with rare min A 80% of opportunities   Time 2   Period Weeks   Status On-going     SLP LONG TERM GOAL #3   Title Pt will identify and self correct errors on cognitive tasks with occasional min A over three sessions   Baseline 06-26-16, 07-01-16   Time 2   Period Weeks   Status On-going          Plan - 07/08/16 1446    Clinical Impression Statement Pt continues to require extended time for slow processing and SLP A for attention and awareness - pt req'd usual cues for turn taking after engaged in conversation with SLP during turn taking task.  Continue skilled ST to maximize cognition for possible return to her home in [facility] to live alone (other sister is there to provide support)   Speech Therapy Frequency 2x / week   Duration  --  8 weeks   Treatment/Interventions Compensatory strategies;Patient/family education;Functional tasks;Cognitive reorganization;Internal/external aids;SLP instruction and feedback;Cueing hierarchy   Potential to Achieve Goals Good   Potential Considerations Ability to learn/carryover information   Consulted and Agree with Plan of Care Patient      Patient will benefit from skilled therapeutic intervention in order to improve the following deficits and impairments:   Cognitive communication deficit    Problem List There are no active problems to display for this patient.   [facility] ,[place], [place]  07/08/2016, 2:48 PM  [facility] [address] [place], [place], [address] Phone: [phone]   Fax:  [phone]   Name: Bridget Jackson MRN: [id number] Date of Birth: [birth year]

## 2016-07-09 NOTE — Therapy (Signed)
[facility] [address] [place], [place], [address] Phone: [phone]   Fax:  [phone]  Physical Therapy Treatment  Patient Details  Name: Bridget Jackson MRN: [id number] Date of Birth: 02/09/1950 Referring Provider: Earline Mayotte, MD  Encounter Date: 07/08/2016      PT End of Session - 07/08/16 1324    Visit Number 14   Number of Visits 17   Date for PT Re-Evaluation 07/21/16   Authorization Type MCR prim, BCBS sec G Code needed every 10th visit   PT Start Time 1320   PT Stop Time 1400   PT Time Calculation (min) 40 min   Equipment Utilized During Treatment Gait belt   Activity Tolerance Patient tolerated treatment well   Behavior During Therapy [facility] for tasks assessed/performed      History reviewed. No pertinent past medical history.  History reviewed. No pertinent surgical history.  There were no vitals filed for this visit.      Subjective Assessment - 07/08/16 1323    Subjective No new changes. No falls or pain to report.    Patient is accompained by: Family member   Limitations House hold activities;Walking;Standing   Patient Stated Goals "I want to walk better."    Currently in Pain? No/denies   Pain Score 0-No pain             [facility] Adult PT Treatment/Exercise - 07/08/16 1325      Transfers   Transfers Sit to Stand;Stand to Sit   Sit to Stand 5: Supervision;With upper extremity assist;From bed;From chair/3-in-1   Stand to Sit 5: Supervision;With upper extremity assist;To bed;To chair/3-in-1     Ambulation/Gait   Ambulation/Gait Yes   Ambulation/Gait Assistance 4: Min guard;4: Min assist   Ambulation/Gait Assistance Details loss of balance x1 on gravel needing up to min assist for balance; min guard with remaining gait on outdoor surfaces with pt scanning enviroment. cues for increased step length and increased base of support with gait.                         Ambulation  Distance (Feet) 500 Feet   Assistive device None   Gait Pattern Step-through pattern;Decreased stride length;Decreased arm swing - right;Decreased arm swing - left;Lateral trunk lean to right;Trunk flexed   Ambulation Surface Level;Unlevel;Indoor;Outdoor;Paved;Gravel;Grass     High Level Balance   High Level Balance Activities Side stepping;Backward walking;Marching forwards  toe walk fwd/bwd, heel walk fwd/bwd   High Level Balance Comments on red mats without UE support: side stepping left<>right, marching fwd/walking backwards, toe walk and heel walk fwd/bwd x 3 laps each with min to mod assist for balance. cues for posture, weight shifting and ex form/technique.       Neuro Re-ed    Neuro Re-ed Details  standing across blue foam beam: EC  no head movements, progressing to EO head movements left<>right and up<>down, min assist to mod assist for balance without UE support on bars.             PT Short Term Goals - 06/22/16 1542      PT SHORT TERM GOAL #1   Title Pt/sister will be independent with initial HEP in order to indicate improved functional mobility and decreased fall risk. (Target Date: 06/19/16)   Baseline Met, 06/22/16.   Time 4   Period Weeks   Status Achieved     PT SHORT TERM GOAL #2   Title Pt  will perform 5TSS in </=15 secs without UE support in order to indicate improved BLE strength.     Baseline 5TSS in 21.75 without UE support.   Time 4   Period Weeks   Status Not Met     PT SHORT TERM GOAL #3   Title Will assess gait speed and write appropriate goal.    Baseline Gait velocity 3.69f/sec (06/22/16) vs initial 2.764fsec (05/27/16).   Time 4   Period Weeks   Status New     PT SHORT TERM GOAL #4   Title Will assess 6MWT and improve distance by 7518in order to indicate improved functional endurance.     Baseline Inital distance 540' and distance for 06/15/16 895' no AD, no seated rest breaks; Met.   Time 4   Period Weeks   Status Achieved     PT SHORT  TERM GOAL #5   Title Pt will improve DGI to 16/24 in order to indicate decreased fall risk.     Baseline DGI 17/24, 06/15/16.   Time 4   Period Weeks   Status Achieved     PT SHORT TERM GOAL #6   Title Pt will ambulate over varying indoor surfaces while scanning environment up to 300' w/ LRAD at mod I level in order to indicate safe home negotiation.    Baseline 300' in home environment Mod I; met ,06/15/16.   Time 4   Period Weeks   Status Achieved           PT Long Term Goals - 06/25/16 2020      PT LONG TERM GOAL #1   Title Pt/sister will be independent with final HEP in order to indicate improved functional mobility and decreased fall risk (Target Date: 07/17/16)   Time 8   Period Weeks   Status New     PT LONG TERM GOAL #2   Title Pt will negotiate 2 steps, then 1 step without rails at S level in alternating fashion in order to indicate safe entry/exit at home.     Time 8   Period Weeks   Status New     PT LONG TERM GOAL #3   Title Pt will ambulate with gait speed >/= 2.00 ft/sec in order to indicate decreased fall risk and improved efficiency of gait.     Baseline met at STG, will defer this goal    Time 8   Period Weeks   Status Deferred     PT LONG TERM GOAL #4   Title Pt will improve DGI to >19/24 in order to indicate decreased fall risk.     Time 8   Period Weeks   Status New     PT LONG TERM GOAL #5   Title Pt will improve 6MWT distance by 150' from baseline in order to indicate improved functional endurance.     Time 8   Period Weeks   Status New     PT LONG TERM GOAL #6   Title Pt will ambulate up to 500' over unlevel paved surfaces w/ LRAD at mod I level in order to indicate return to community activities.     Time 8   Period Weeks   Status New          Plan - 07/08/16 1324    Clinical Impression Statement Today's skilled session continued to focus on gait on compliant surfaces and high level balance. Pt continues to be challenged by both  needing increased UE support for  balance. Pt is progressing toward goals and should benefit from continued PT to progress toward goals.    Rehab Potential Good   Clinical Impairments Affecting Rehab Potential see past medical history above   PT Frequency 2x / week   PT Duration 8 weeks   PT Treatment/Interventions ADLs/Self Care Home Management;DME Instruction;Gait training;Stair training;Functional mobility training;Therapeutic activities;Therapeutic exercise;Balance training;Neuromuscular re-education;Cognitive remediation;Patient/family education;Vestibular;Energy conservation   PT Next Visit Plan gait with head turns/enviromental scanning, balance on complaint surfaces, balance with single leg stance emphasis, begin checking LTGs due 07/17/16.   Consulted and Agree with Plan of Care Patient      Patient will benefit from skilled therapeutic intervention in order to improve the following deficits and impairments:  Decreased activity tolerance, Decreased balance, Decreased cognition, Decreased coordination, Decreased endurance, Decreased knowledge of use of DME, Decreased mobility, Decreased strength, Impaired perceived functional ability, Impaired flexibility, Postural dysfunction  Visit Diagnosis: Unsteadiness on feet  Muscle weakness (generalized)  Other abnormalities of gait and mobility     Problem List There are no active problems to display for this patient.   Willow Ora, PTA, [facility] [address] [place], [place] [address] [phone] 07/09/16, 5:05 PM   Name: Bridget Jackson MRN: [id number] Date of Birth: [birth year]

## 2016-07-10 ENCOUNTER — Ambulatory Visit: Payer: Medicare Other | Admitting: Physical Therapy

## 2016-07-10 ENCOUNTER — Encounter: Payer: Self-pay | Admitting: Physical Therapy

## 2016-07-10 ENCOUNTER — Ambulatory Visit: Payer: Medicare Other

## 2016-07-10 DIAGNOSIS — M6281 Muscle weakness (generalized): Secondary | ICD-10-CM | POA: Diagnosis not present

## 2016-07-10 DIAGNOSIS — R2689 Other abnormalities of gait and mobility: Secondary | ICD-10-CM | POA: Diagnosis not present

## 2016-07-10 DIAGNOSIS — R41841 Cognitive communication deficit: Secondary | ICD-10-CM | POA: Diagnosis not present

## 2016-07-10 DIAGNOSIS — R2681 Unsteadiness on feet: Secondary | ICD-10-CM

## 2016-07-10 NOTE — Therapy (Signed)
[facility] [address] [place], [place], [address] Phone: [phone]   Fax:  [phone]  Speech Language Pathology Treatment  Patient Details  Name: Bridget Jackson MRN: [id number] Date of Birth: [birth year] Referring Provider: Dr. Earline Mayotte  Encounter Date: 07/10/2016      End of Session - 07/10/16 1516    Visit Number 14   Number of Visits 17   Date for SLP Re-Evaluation 07/24/16   SLP Start Time 27   SLP Stop Time  1100   SLP Time Calculation (min) 41 min   Activity Tolerance Patient tolerated treatment well      No past medical history on file.  No past surgical history on file.  There were no vitals filed for this visit.      Subjective Assessment - 07/10/16 1029    Subjective Pt 10 minutes late.   Currently in Pain? No/denies               ADULT SLP TREATMENT - 07/10/16 0001      General Information   Behavior/Cognition Alert;Cooperative;Pleasant mood     Treatment Provided   Treatment provided Cognitive-Linquistic     Cognitive-Linquistic Treatment   Treatment focused on Cognition   Skilled Treatment Pt's sister told SLP that she's "distracting" pt as she's doing things. Pt is playing 31 8's with sister and talking in conversation about pt moving to [place] permanently. Pt engaged in simple to mod complex task (smiple deductive reasoning puzzle) with listening for commercials on the radio. Good success with both, but with alternating attention instead of divided attention.     Assessment / Recommendations / Plan   Plan Continue with current plan of care     Progression Toward Goals   Progression toward goals Progressing toward goals            SLP Short Term Goals - 07/01/16 1633      SLP SHORT TERM GOAL #1   Title Pt will  alterate attention between 2 simple functional cognitive linguistic tasks with 85% accuracy on each and occasional min A   Status  Achieved     SLP SHORT TERM GOAL #2   Title Pt will solve mildly complex organanization, reasoning, functional math problems with 85% accuracy and rare min A   Status Achieved  85% independently     SLP SHORT TERM GOAL #3   Title Pt will complete mildly complex naming tasks with 90% accuracy and rare min A   Status Achieved     SLP SHORT TERM GOAL #4   Title Pt will utilize external aids to recall appoinments, conversations, lists over 3 sessions with occasional min A   Status Achieved          SLP Long Term Goals - 07/10/16 1041      SLP LONG TERM GOAL #1   Title Pt will divide attention between 2 simple cognitive linguistic tasks with 85% on each and occasional min A   Time 2   Period Weeks   Status On-going     SLP LONG TERM GOAL #2   Title Pt will utilize compensations for word finding episodes with rare min A 80% of opportunities   Time 2   Period Weeks   Status On-going     SLP LONG TERM GOAL #3   Title Pt will identify and self correct errors on cognitive tasks with occasional min A over three sessions   Baseline 06-26-16, 07-01-16  Time 2   Period Weeks   Status On-going          Plan - 07/10/16 1517    Clinical Impression Statement Pt continues to require extended time for slow processing and SLP A for attention and awareness; emergent awareness appears better in simple tasks than simple-mod complex tasks.  Continue skilled ST to maximize cognition for possible return to her home in [facility] to live alone (other sister is there to provide support)   Speech Therapy Frequency 2x / week   Duration --  8 weeks   Treatment/Interventions Compensatory strategies;Patient/family education;Functional tasks;Cognitive reorganization;Internal/external aids;SLP instruction and feedback;Cueing hierarchy   Potential to Achieve Goals Good   Potential Considerations Ability to learn/carryover information   Consulted and Agree with Plan of Care Patient      Patient  will benefit from skilled therapeutic intervention in order to improve the following deficits and impairments:   Cognitive communication deficit    Problem List There are no active problems to display for this patient.   [facility] ,[place], [place]  07/10/2016, 3:18 PM  [facility] [address] [place], [place], [address] Phone: [phone]   Fax:  [phone]   Name: Bridget Jackson MRN: [id number] Date of Birth: Apr 03, 1950

## 2016-07-11 NOTE — Therapy (Signed)
[facility] [address] [place], [place], [address] Phone: [phone]   Fax:  [phone]  Physical Therapy Treatment  Patient Details  Name: Bridget Jackson MRN: [id number] Date of Birth: Jul 19, 1950 Referring Provider: Earline Mayotte, MD  Encounter Date: 07/10/2016      PT End of Session - 07/10/16 1105    Visit Number 15   Number of Visits 17   Date for PT Re-Evaluation 07/21/16   Authorization Type MCR prim, BCBS sec G Code needed every 10th visit   PT Start Time 1103   PT Stop Time 1143   PT Time Calculation (min) 40 min   Equipment Utilized During Treatment Gait belt   Activity Tolerance Patient tolerated treatment well   Behavior During Therapy [facility] for tasks assessed/performed      History reviewed. No pertinent past medical history.  History reviewed. No pertinent surgical history.  There were no vitals filed for this visit.      Subjective Assessment - 07/10/16 1105    Subjective No new changes. No falls or pain to report.    Limitations House hold activities;Walking;Standing   Patient Stated Goals "I want to walk better."    Currently in Pain? No/denies   Pain Score 0-No pain            [facility] Adult PT Treatment/Exercise - 07/10/16 1107      Transfers   Transfers Sit to Stand;Stand to Sit   Sit to Stand 5: Supervision;With upper extremity assist;From bed;From chair/3-in-1   Stand to Sit 5: Supervision;With upper extremity assist;To bed;To chair/3-in-1     Ambulation/Gait   Ambulation/Gait Yes   Assistive device None   Gait Pattern Step-through pattern;Decreased stride length;Decreased arm swing - right;Decreased arm swing - left;Lateral trunk lean to right;Trunk flexed   Ambulation Surface Level;Indoor   Gait Comments gait along ~50 foot hallway: gait with head movements left<>right and up<>down x 4 laps each with min guard assist. decr gait speed noted with head movements, no  veering. pt did need cues to incr head movements (not to just move her eyes).      Neuro Re-ed    Neuro Re-ed Details  standing in middle of red mat: worked on stepping strategies with weight shifting using prewritten instructions that had random left/right stepping in random directions. Had pt move arms in direction of stepping as well.              Balance Exercises - 07/10/16 1119      Balance Exercises: Standing   SLS with Vectors Foam/compliant surface;Limitations   Tandem Gait Forward;Retro;3 reps;Limitations   Other Standing Exercises fwd/bwd high knee marching x 3 laps on loose, small gravel, followed by 3 laps on rubber mulch. min guard to min assist with cues for high knees and posture.      Balance Exercises: Standing   SLS with Vectors Limitations standing on blue mat with foam bubbles in semi circle: toe taps to 4 points (lateral, right fwd/lateral, fwd, left fwd/lateral). did not have pt cross over most lateral point. pt performed 4 laps with each foot with min guard to min assist for balance.    Tandem Gait Limitations performed x 3 laps each way on small loose gravel, then x3 laps each way on rubber mulch with min guard to min assist for balance. cues on posture and foot placement needed through out             PT Short Term  Goals - 06/22/16 1542      PT SHORT TERM GOAL #1   Title Pt/sister will be independent with initial HEP in order to indicate improved functional mobility and decreased fall risk. (Target Date: 06/19/16)   Baseline Met, 06/22/16.   Time 4   Period Weeks   Status Achieved     PT SHORT TERM GOAL #2   Title Pt will perform 5TSS in </=15 secs without UE support in order to indicate improved BLE strength.     Baseline 5TSS in 21.75 without UE support.   Time 4   Period Weeks   Status Not Met     PT SHORT TERM GOAL #3   Title Will assess gait speed and write appropriate goal.    Baseline Gait velocity 3.50f/sec (06/22/16) vs initial  2.774fsec (05/27/16).   Time 4   Period Weeks   Status New     PT SHORT TERM GOAL #4   Title Will assess 6MWT and improve distance by 7540in order to indicate improved functional endurance.     Baseline Inital distance 540' and distance for 06/15/16 895' no AD, no seated rest breaks; Met.   Time 4   Period Weeks   Status Achieved     PT SHORT TERM GOAL #5   Title Pt will improve DGI to 16/24 in order to indicate decreased fall risk.     Baseline DGI 17/24, 06/15/16.   Time 4   Period Weeks   Status Achieved     PT SHORT TERM GOAL #6   Title Pt will ambulate over varying indoor surfaces while scanning environment up to 300' w/ LRAD at mod I level in order to indicate safe home negotiation.    Baseline 300' in home environment Mod I; met ,06/15/16.   Time 4   Period Weeks   Status Achieved           PT Long Term Goals - 06/25/16 2020      PT LONG TERM GOAL #1   Title Pt/sister will be independent with final HEP in order to indicate improved functional mobility and decreased fall risk (Target Date: 07/17/16)   Time 8   Period Weeks   Status New     PT LONG TERM GOAL #2   Title Pt will negotiate 2 steps, then 1 step without rails at S level in alternating fashion in order to indicate safe entry/exit at home.     Time 8   Period Weeks   Status New     PT LONG TERM GOAL #3   Title Pt will ambulate with gait speed >/= 2.00 ft/sec in order to indicate decreased fall risk and improved efficiency of gait.     Baseline met at STG, will defer this goal    Time 8   Period Weeks   Status Deferred     PT LONG TERM GOAL #4   Title Pt will improve DGI to >19/24 in order to indicate decreased fall risk.     Time 8   Period Weeks   Status New     PT LONG TERM GOAL #5   Title Pt will improve 6MWT distance by 150' from baseline in order to indicate improved functional endurance.     Time 8   Period Weeks   Status New     PT LONG TERM GOAL #6   Title Pt will ambulate up to 500'  over unlevel paved surfaces w/ LRAD at mod I level  in order to indicate return to community activities.     Time 8   Period Weeks   Status New            Plan - 07/10/16 1105    Clinical Impression Statement today's skilled session continued to address high level balance on compaint surfaces and in single leg stance. Pt is making steady progress toward goals and should benefit from continued PT to progress toward unmet goals.    Rehab Potential Good   Clinical Impairments Affecting Rehab Potential see past medical history above   PT Frequency 2x / week   PT Duration 8 weeks   PT Treatment/Interventions ADLs/Self Care Home Management;DME Instruction;Gait training;Stair training;Functional mobility training;Therapeutic activities;Therapeutic exercise;Balance training;Neuromuscular re-education;Cognitive remediation;Patient/family education;Vestibular;Energy conservation   PT Next Visit Plan gait with head turns/enviromental scanning, balance on complaint surfaces, balance with single leg stance emphasis, begin checking LTGs due 07/17/16.   Consulted and Agree with Plan of Care Patient      Patient will benefit from skilled therapeutic intervention in order to improve the following deficits and impairments:  Decreased activity tolerance, Decreased balance, Decreased cognition, Decreased coordination, Decreased endurance, Decreased knowledge of use of DME, Decreased mobility, Decreased strength, Impaired perceived functional ability, Impaired flexibility, Postural dysfunction  Visit Diagnosis: Unsteadiness on feet  Muscle weakness (generalized)  Other abnormalities of gait and mobility     Problem List There are no active problems to display for this patient.   Willow Ora, PTA, [facility] [address], [place], [place] [address] [phone] 07/11/16, 2:26 PM   Name: Bridget Jackson MRN: [id number] Date of Birth: December 25, 1950

## 2016-07-13 ENCOUNTER — Ambulatory Visit: Payer: Medicare Other

## 2016-07-13 ENCOUNTER — Ambulatory Visit: Payer: Medicare Other | Admitting: Rehabilitation

## 2016-07-13 ENCOUNTER — Encounter: Payer: Self-pay | Admitting: Rehabilitation

## 2016-07-13 DIAGNOSIS — R2689 Other abnormalities of gait and mobility: Secondary | ICD-10-CM | POA: Diagnosis not present

## 2016-07-13 DIAGNOSIS — M6281 Muscle weakness (generalized): Secondary | ICD-10-CM | POA: Diagnosis not present

## 2016-07-13 DIAGNOSIS — R2681 Unsteadiness on feet: Secondary | ICD-10-CM | POA: Diagnosis not present

## 2016-07-13 DIAGNOSIS — R41841 Cognitive communication deficit: Secondary | ICD-10-CM

## 2016-07-13 NOTE — Therapy (Signed)
[facility] [address] [place], [place], [address] Phone: [phone]   Fax:  [phone]  Speech Language Pathology Treatment  Patient Details  Name: Bridget Jackson MRN: [id number] Date of Birth: 07-09-1950 Referring Provider: Dr. Earline Mayotte  Encounter Date: 07/13/2016      End of Session - 07/13/16 1703    Visit Number 15   Number of Visits 17   Date for SLP Re-Evaluation 07/24/16   SLP Start Time 1449   SLP Stop Time  1530   SLP Time Calculation (min) 41 min   Activity Tolerance Patient tolerated treatment well      No past medical history on file.  No past surgical history on file.  There were no vitals filed for this visit.      Subjective Assessment - 07/13/16 1503    Subjective Pt has been doing divided attention tasks with her sister.   Currently in Pain? No/denies               ADULT SLP TREATMENT - 07/13/16 1503      General Information   Behavior/Cognition Alert;Cooperative;Pleasant mood     Treatment Provided   Treatment provided Cognitive-Linquistic     Cognitive-Linquistic Treatment   Treatment focused on Cognition   Skilled Treatment Pt seen to, and reported to SLP, use compensatory techniques with any anomic episodes during therapy. In divided attention tasks, pt req'd occasional cues for auditory information tracking and progressing to usual cues as tasks progressed in time.       Assessment / Recommendations / Plan   Plan Continue with current plan of care     Progression Toward Goals   Progression toward goals Progressing toward goals  last session will be Thursday - pt agrees.            SLP Short Term Goals - 07/01/16 1633      SLP SHORT TERM GOAL #1   Title Pt will  alterate attention between 2 simple functional cognitive linguistic tasks with 85% accuracy on each and occasional min A   Status Achieved     SLP SHORT TERM GOAL #2   Title Pt  will solve mildly complex organanization, reasoning, functional math problems with 85% accuracy and rare min A   Status Achieved  85% independently     SLP SHORT TERM GOAL #3   Title Pt will complete mildly complex naming tasks with 90% accuracy and rare min A   Status Achieved     SLP SHORT TERM GOAL #4   Title Pt will utilize external aids to recall appoinments, conversations, lists over 3 sessions with occasional min A   Status Achieved          SLP Long Term Goals - 07/13/16 1504      SLP LONG TERM GOAL #1   Title Pt will divide attention between 2 simple cognitive linguistic tasks with 85% on each and occasional min A over two sessions   Time 1   Period Weeks   Status Revised     SLP LONG TERM GOAL #2   Title Pt will utilize compensations for word finding episodes with rare min A 80% of opportunities   Status Achieved     SLP LONG TERM GOAL #3   Title Pt will identify and self correct errors on cognitive tasks with occasional min A over three sessions   Status Achieved          Plan - 07/13/16  63    Clinical Impression Statement Pt continues to require minor amount of extended time for slow processing and SLP A for divided attention; emergent awareness appears functional in mod complex tasks working in min distracting environment.  Continue skilled ST to maximize cognition for possible return to her home in [facility] to live alone (other sister is there to provide support)   Speech Therapy Frequency 2x / week   Duration --  8 weeks   Treatment/Interventions Compensatory strategies;Patient/family education;Functional tasks;Cognitive reorganization;Internal/external aids;SLP instruction and feedback;Cueing hierarchy   Potential to Achieve Goals Good   Potential Considerations Ability to learn/carryover information   Consulted and Agree with Plan of Care Patient      Patient will benefit from skilled therapeutic intervention in order to improve the following  deficits and impairments:   Cognitive communication deficit    Problem List There are no active problems to display for this patient.   [facility] ,[place]  07/13/2016, 5:05 PM  [facility] [address] [place], [place], [address] Phone: [phone]   Fax:  [phone]   Name: Bridget Jackson MRN: [id number] Date of Birth: 04/06/1950

## 2016-07-13 NOTE — Therapy (Signed)
[facility] [address] [place], [place], [address] Phone: [phone]   Fax:  [phone]  Physical Therapy Treatment  Patient Details  Name: Bridget Jackson MRN: [id number] Date of Birth: 04-08-1950 Referring Provider: Earline Mayotte, MD  Encounter Date: 07/13/2016      PT End of Session - 07/13/16 1410    Visit Number 16   Number of Visits 17   Date for PT Re-Evaluation 07/21/16   Authorization Type MCR prim, BCBS sec G Code needed every 10th visit   PT Start Time 1406  PT late from mtg w/ other PT   PT Stop Time 1445   PT Time Calculation (min) 39 min   Equipment Utilized During Treatment Gait belt   Activity Tolerance Patient tolerated treatment well   Behavior During Therapy [facility] for tasks assessed/performed      History reviewed. No pertinent past medical history.  History reviewed. No pertinent surgical history.  There were no vitals filed for this visit.      Subjective Assessment - 07/13/16 1408    Subjective No changes, no falls.    Patient is accompained by: Family member   Limitations House hold activities;Walking;Standing   Patient Stated Goals "I want to walk better."    Currently in Pain? No/denies            [facility] PT Assessment - 07/13/16 0001      Ambulation/Gait   Ambulation/Gait Yes   Ambulation/Gait Assistance 6: Modified independent (Device/Increase time)   Ambulation/Gait Assistance Details Pt able to ambulate distances up to 500' over unlevel paved outdoor surfaces during session at mod I level.  Feel that from a mobility standpoint, she is mod I, however from a cognitive standpoint, would recommend distant S.     Ambulation Distance (Feet) 500 Feet   Assistive device None   Gait Pattern Step-through pattern;Decreased stride length;Decreased arm swing - right;Decreased arm swing - left;Lateral trunk lean to right;Trunk flexed   Ambulation Surface  Level;Unlevel;Outdoor;Paved   Stairs Yes   Stairs Assistance 5: Supervision   Stairs Assistance Details (indicate cue type and reason) Set up steps to simulate home entry with 2 steps, then space with one more single step.  She was able to perform x 3 reps with S with min cues for wider BOS and step through pattern during last step.     Stair Management Technique No rails;Step to pattern   Number of Stairs 3  x 3 reps     6 Minute Walk- Baseline   6 Minute Walk- Baseline yes   HR (bpm) 76   02 Sat (%RA) 98 %   Modified Borg Scale for Dyspnea 0- Nothing at all   Perceived Rate of Exertion (Borg) 6-     6 Minute walk- Post Test   6 Minute Walk Post Test yes   HR (bpm) 91   02 Sat (%RA) 96 %   Modified Borg Scale for Dyspnea 0- Nothing at all   Perceived Rate of Exertion (Borg) 9- very light     6 minute walk test results    Aerobic Endurance Distance Walked 1009   Endurance additional comments Min cues to improve L trunk lean as best as possible, but no overt LOB during testing.                      [facility] Adult PT Treatment/Exercise - 07/13/16 1444      Standardized Balance Assessment  Standardized Balance Assessment Dynamic Gait Index     Dynamic Gait Index   Level Surface Normal   Step Over Obstacle Mild Impairment   Step Around Obstacles Mild Impairment   Steps Mild Impairment                PT Education - 07/13/16 1840    Education provided Yes   Education Details D/C at next visit   Person(s) Educated Patient   Methods Explanation   Comprehension Verbalized understanding          PT Short Term Goals - 06/22/16 1542      PT SHORT TERM GOAL #1   Title Pt/sister will be independent with initial HEP in order to indicate improved functional mobility and decreased fall risk. (Target Date: 06/19/16)   Baseline Met, 06/22/16.   Time 4   Period Weeks   Status Achieved     PT SHORT TERM GOAL #2   Title Pt will perform 5TSS in </=15 secs  without UE support in order to indicate improved BLE strength.     Baseline 5TSS in 21.75 without UE support.   Time 4   Period Weeks   Status Not Met     PT SHORT TERM GOAL #3   Title Will assess gait speed and write appropriate goal.    Baseline Gait velocity 3.46f/sec (06/22/16) vs initial 2.727fsec (05/27/16).   Time 4   Period Weeks   Status New     PT SHORT TERM GOAL #4   Title Will assess 6MWT and improve distance by 7597in order to indicate improved functional endurance.     Baseline Inital distance 540' and distance for 06/15/16 895' no AD, no seated rest breaks; Met.   Time 4   Period Weeks   Status Achieved     PT SHORT TERM GOAL #5   Title Pt will improve DGI to 16/24 in order to indicate decreased fall risk.     Baseline DGI 17/24, 06/15/16.   Time 4   Period Weeks   Status Achieved     PT SHORT TERM GOAL #6   Title Pt will ambulate over varying indoor surfaces while scanning environment up to 300' w/ LRAD at mod I level in order to indicate safe home negotiation.    Baseline 300' in home environment Mod I; met ,06/15/16.   Time 4   Period Weeks   Status Achieved           PT Long Term Goals - 07/13/16 1415      PT LONG TERM GOAL #1   Title Pt/sister will be independent with final HEP in order to indicate improved functional mobility and decreased fall risk (Target Date: 07/17/16)   Time 8   Period Weeks   Status New     PT LONG TERM GOAL #2   Title Pt will negotiate 2 steps, then 1 step without rails at S level in alternating fashion in order to indicate safe entry/exit at home.     Baseline met 07/13/16   Time 8   Period Weeks   Status Achieved     PT LONG TERM GOAL #3   Title Pt will ambulate with gait speed >/= 2.00 ft/sec in order to indicate decreased fall risk and improved efficiency of gait.     Baseline met at STG, will defer this goal    Time 8   Period Weeks   Status Deferred     PT LONG TERM GOAL #  4   Title Pt will improve DGI to  >19/24 in order to indicate decreased fall risk.     Time 8   Period Weeks   Status New     PT LONG TERM GOAL #5   Title Pt will improve 6MWT distance by 150' from baseline in order to indicate improved functional endurance.     Baseline 1009' on 07/13/16 (up from 540' at initial assessment)    Time 8   Period Weeks   Status Achieved     PT LONG TERM GOAL #6   Title Pt will ambulate up to 500' over unlevel paved surfaces w/ LRAD at mod I level in order to indicate return to community activities.     Baseline met 07/13/16 without AD (from safety aspect she is mod I, however would likely recommend distant S from a cognitive standpoint).     Time 8   Period Weeks   Status Achieved               Plan - 07/13/16 1841    Clinical Impression Statement Skilled session began to look at LTGs to work towards D/C at next visit.  Pt has met 3/5 LTGs (not including deferred goal for gait speed) as of today.  She has made excellent progress with her endurance and balance making marked gains with 6MWT.  See below for details.  Will plan to D/C at next visit.     Rehab Potential Good   Clinical Impairments Affecting Rehab Potential see past medical history above   PT Frequency 2x / week   PT Duration 8 weeks   PT Treatment/Interventions ADLs/Self Care Home Management;DME Instruction;Gait training;Stair training;Functional mobility training;Therapeutic activities;Therapeutic exercise;Balance training;Neuromuscular re-education;Cognitive remediation;Patient/family education;Vestibular;Energy conservation   PT Next Visit Plan check remaining goals and D/C    Consulted and Agree with Plan of Care Patient      Patient will benefit from skilled therapeutic intervention in order to improve the following deficits and impairments:  Decreased activity tolerance, Decreased balance, Decreased cognition, Decreased coordination, Decreased endurance, Decreased knowledge of use of DME, Decreased mobility,  Decreased strength, Impaired perceived functional ability, Impaired flexibility, Postural dysfunction  Visit Diagnosis: Unsteadiness on feet  Muscle weakness (generalized)  Other abnormalities of gait and mobility     Problem List There are no active problems to display for this patient.   Cameron Sprang, PT, MPT [facility] [address] [place], [place], [address] Phone: [phone]   Fax:  [phone] 07/13/16, 6:52 PM  Name: Rocsi Hazelbaker MRN: [id number] Date of Birth: 1950-11-08

## 2016-07-15 DIAGNOSIS — Z8673 Personal history of transient ischemic attack (TIA), and cerebral infarction without residual deficits: Secondary | ICD-10-CM | POA: Diagnosis not present

## 2016-07-15 DIAGNOSIS — F259 Schizoaffective disorder, unspecified: Secondary | ICD-10-CM | POA: Diagnosis not present

## 2016-07-15 DIAGNOSIS — N183 Chronic kidney disease, stage 3 (moderate): Secondary | ICD-10-CM | POA: Diagnosis not present

## 2016-07-15 DIAGNOSIS — D649 Anemia, unspecified: Secondary | ICD-10-CM | POA: Diagnosis not present

## 2016-07-16 ENCOUNTER — Ambulatory Visit: Payer: Medicare Other | Admitting: Physical Therapy

## 2016-07-16 ENCOUNTER — Ambulatory Visit: Payer: Medicare Other

## 2016-07-16 ENCOUNTER — Encounter: Payer: Self-pay | Admitting: Physical Therapy

## 2016-07-16 DIAGNOSIS — R2681 Unsteadiness on feet: Secondary | ICD-10-CM

## 2016-07-16 DIAGNOSIS — M6281 Muscle weakness (generalized): Secondary | ICD-10-CM | POA: Diagnosis not present

## 2016-07-16 DIAGNOSIS — R41841 Cognitive communication deficit: Secondary | ICD-10-CM

## 2016-07-16 DIAGNOSIS — R2689 Other abnormalities of gait and mobility: Secondary | ICD-10-CM | POA: Diagnosis not present

## 2016-07-16 NOTE — Patient Instructions (Signed)
Keep working Engineer, structural and other word games, and talk to [place] at the same time.

## 2016-07-16 NOTE — Therapy (Signed)
[facility] [address] [place], [place], [address] Phone: [phone]   Fax:  [phone]  Speech Language Pathology Treatment  Patient Details  Name: Bridget Jackson MRN: [id number] Date of Birth: 05-09-1950 Referring Provider: Dr. Earline Mayotte  Encounter Date: 07/16/2016      End of Session - 07/16/16 1628    Visit Number 16   Number of Visits 17   Date for SLP Re-Evaluation 07/24/16   SLP Start Time 1404   SLP Stop Time  1446   SLP Time Calculation (min) 42 min   Activity Tolerance Patient tolerated treatment well      No past medical history on file.  No past surgical history on file.  There were no vitals filed for this visit.      Subjective Assessment - 07/16/16 1404    Subjective Pt thanked SLP "for doing so much for (her)."   Currently in Pain? No/denies               ADULT SLP TREATMENT - 07/16/16 1406      General Information   Behavior/Cognition Alert;Cooperative;Pleasant mood     Treatment Provided   Treatment provided Cognitive-Linquistic     Cognitive-Linquistic Treatment   Treatment focused on Cognition   Skilled Treatment Divided attention targeted today with written and auditory tasks - pt with 90% success on auditory with SBA, and 100% success on crossword. Pt sustained this divided attention for 38 minutes. Pt's langauge appears to be WFL/WNL at this time as well.     Assessment / Recommendations / Plan   Plan Continue with current plan of care     Progression Toward Goals   Progression toward goals --  d/c day - see goals            SLP Short Term Goals - 07/01/16 1633      SLP SHORT TERM GOAL #1   Title Pt will  alterate attention between 2 simple functional cognitive linguistic tasks with 85% accuracy on each and occasional min A   Status Achieved     SLP SHORT TERM GOAL #2   Title Pt will solve mildly complex organanization, reasoning,  functional math problems with 85% accuracy and rare min A   Status Achieved  85% independently     SLP SHORT TERM GOAL #3   Title Pt will complete mildly complex naming tasks with 90% accuracy and rare min A   Status Achieved     SLP SHORT TERM GOAL #4   Title Pt will utilize external aids to recall appoinments, conversations, lists over 3 sessions with occasional min A   Status Achieved          SLP Long Term Goals - 07/16/16 1630      SLP LONG TERM GOAL #1   Title Pt will divide attention between 2 simple cognitive linguistic tasks with 85% on each and occasional min A over two sessions   Status Partially Met  one/two visits     SLP LONG TERM GOAL #2   Title Pt will utilize compensations for word finding episodes with rare min A 80% of opportunities   Status Achieved     SLP LONG TERM GOAL #3   Title Pt will identify and self correct errors on cognitive tasks with occasional min A over three sessions   Status Achieved          Plan - 07/16/16 1629    Clinical Impression Statement Pt  continues to require minor amount of extended time for slow processing and SLP A for divided attention; emergent awareness appears functional in mod complex tasks working in min distracting environment.  Continue skilled ST to maximize cognition for possible return to her home in [facility] to live alone (other sister is there to provide support)   Treatment/Interventions Compensatory strategies;Patient/family education;Functional tasks;Cognitive reorganization;Internal/external aids;SLP instruction and feedback;Cueing hierarchy   Potential to Achieve Goals Good   Potential Considerations Ability to learn/carryover information   Consulted and Agree with Plan of Care Patient      Patient will benefit from skilled therapeutic intervention in order to improve the following deficits and impairments:   Cognitive communication deficit      G-Codes - Jul 22, 2016 1630    Functional Assessment  Tool Used NOMS   Functional Limitations Attention   Attention Goal Status (J2426) At least 1 percent but less than 20 percent impaired, limited or restricted   Attention Discharge Status (S3419) At least 1 percent but less than 20 percent impaired, limited or restricted     [place]  Visits from Start of Care: 16  Current functional level related to goals / functional outcomes: See pt's goal update. LTGs were partially met, mostly due to pt meeting 1 of 2 sessions with divided attention goal (LTG #1).    Remaining deficits: Minimal high level attention deficits.   Education / Equipment: Cognitive linguistic compensations to improve communication with sister and community.  Plan: Patient agrees to discharge.  Patient goals were partially met. Patient is being discharged due to being pleased with the current functional level.  ?????        Problem List There are no active problems to display for this patient.   [facility] 22-Jul-2016, 4:31 PM  [facility] [address] [place], [place], [address] Phone: [phone]   Fax:  [phone]   Name: Aariya Ferrick MRN: [id number] Date of Birth: 1950/10/12

## 2016-07-16 NOTE — Therapy (Signed)
[facility] [address] [place], [place], [address] Phone: [phone]   Fax:  [phone]  Physical Therapy Treatment and D/C summary  Patient Details  Name: Bridget Jackson MRN: [id number] Date of Birth: [birth year] Referring Provider: Earline Mayotte, MD  Encounter Date: 07/16/2016      PT End of Session - 07/16/16 1342    Visit Number 17   Number of Visits 17   Date for PT Re-Evaluation 07/21/16  D/C today   Authorization Type MCR prim, BCBS sec G Code needed every 10th visit   PT Start Time 1318   PT Stop Time 1400   PT Time Calculation (min) 42 min   Activity Tolerance Patient tolerated treatment well   Behavior During Therapy [facility] for tasks assessed/performed      History reviewed. No pertinent past medical history.  History reviewed. No pertinent surgical history.  There were no vitals filed for this visit.      Subjective Assessment - 07/16/16 1324    Subjective Pt excited about all of the progress she has made; excited about D/C.   Patient is accompained by: Family member   Limitations House hold activities;Walking;Standing   Patient Stated Goals "I want to walk better."             [facility] PT Assessment - 07/16/16 1326      Observation/Other Assessments   Focus on Therapeutic Outcomes (FOTO)  56   Other Surveys  Other Surveys   Activities of Balance Confidence Scale (ABC Scale)  56.9% improved from 29.4%     Ambulation/Gait   Ambulation/Gait Yes   Ambulation/Gait Assistance 6: Modified independent (Device/Increase time)   Ambulation Distance (Feet) 500 Feet   Assistive device None   Gait Pattern Step-through pattern;Decreased stride length;Decreased arm swing - right;Decreased arm swing - left;Lateral trunk lean to right;Trunk flexed   Ambulation Surface Level;Indoor   Gait velocity - backwards 3.32 ft/sec indoor/level   Stairs Yes   Stairs Assistance 5: Supervision   Stairs  Assistance Details (indicate cue type and reason) advised to turn slightly sideways when descending stairs to improve eccentric control and forwards momentum   Stair Management Technique No rails;Step to pattern   Number of Stairs 4  x 3 reps     Standardized Balance Assessment   Standardized Balance Assessment Dynamic Gait Index;Five Times Sit to Stand;10 meter walk test   Five times sit to stand comments  18.34 seconds   10 Meter Walk 9.85 seconds or 3.32 ft/sec     Dynamic Gait Index   Level Surface Mild Impairment   Change in Gait Speed Mild Impairment   Gait with Horizontal Head Turns Mild Impairment   Gait with Vertical Head Turns Mild Impairment   Gait and Pivot Turn Mild Impairment   Step Over Obstacle Mild Impairment   Step Around Obstacles Normal   Steps Mild Impairment   Total Score 17   DGI comment: 17/24; scores of 19 or less are predictive of falls                      [facility] Adult PT Treatment/Exercise - 07/16/16 1326      Knee/Hip Exercises: Standing   Step Down Right;Left;1 set;10 reps;Step Height: 4"  min A for weight shifting/control                PT Education - 07/16/16 1651    Education provided Yes   Education Details continue with  HEP          PT Short Term Goals - 06/22/16 1542      PT SHORT TERM GOAL #1   Title Pt/sister will be independent with initial HEP in order to indicate improved functional mobility and decreased fall risk. (Target Date: 06/19/16)   Baseline Met, 06/22/16.   Time 4   Period Weeks   Status Achieved     PT SHORT TERM GOAL #2   Title Pt will perform 5TSS in </=15 secs without UE support in order to indicate improved BLE strength.     Baseline 5TSS in 21.75 without UE support.   Time 4   Period Weeks   Status Not Met     PT SHORT TERM GOAL #3   Title Will assess gait speed and write appropriate goal.    Baseline Gait velocity 3.37f/sec (06/22/16) vs initial 2.752fsec (05/27/16).   Time 4    Period Weeks   Status New     PT SHORT TERM GOAL #4   Title Will assess 6MWT and improve distance by 7565in order to indicate improved functional endurance.     Baseline Inital distance 540' and distance for 06/15/16 895' no AD, no seated rest breaks; Met.   Time 4   Period Weeks   Status Achieved     PT SHORT TERM GOAL #5   Title Pt will improve DGI to 16/24 in order to indicate decreased fall risk.     Baseline DGI 17/24, 06/15/16.   Time 4   Period Weeks   Status Achieved     PT SHORT TERM GOAL #6   Title Pt will ambulate over varying indoor surfaces while scanning environment up to 300' w/ LRAD at mod I level in order to indicate safe home negotiation.    Baseline 300' in home environment Mod I; met ,06/15/16.   Time 4   Period Weeks   Status Achieved           PT Long Term Goals - 07/16/16 1338      PT LONG TERM GOAL #1   Title Pt/sister will be independent with final HEP in order to indicate improved functional mobility and decreased fall risk (Target Date: 07/17/16)   Time 8   Period Weeks   Status Achieved     PT LONG TERM GOAL #2   Title Pt will negotiate 2 steps, then 1 step without rails at S level in alternating fashion in order to indicate safe entry/exit at home.     Baseline met 07/13/16   Time 8   Period Weeks   Status Achieved     PT LONG TERM GOAL #3   Title Pt will ambulate with gait speed >/= 2.00 ft/sec in order to indicate decreased fall risk and improved efficiency of gait.     Baseline met at [facility], will defer this goal    Time 8   Period Weeks   Status Achieved     PT LONG TERM GOAL #4   Title Pt will improve DGI to >19/24 in order to indicate decreased fall risk.     Baseline 17/24 on 6/14; improvement from eval but not to goal level   Time 8   Period Weeks   Status Partially Met     PT LONG TERM GOAL #5   Title Pt will improve 6MWT distance by 150' from baseline in order to indicate improved functional endurance.     Baseline 1009' on  07/13/16 (up  from 57' at initial assessment)    Time 8   Period Weeks   Status Achieved     PT LONG TERM GOAL #6   Title Pt will ambulate up to 500' over unlevel paved surfaces w/ LRAD at mod I level in order to indicate return to community activities.     Baseline met 07/13/16 without AD (from safety aspect she is mod I, however would likely recommend distant S from a cognitive standpoint).     Time 8   Period Weeks   Status Achieved               Plan - 08/07/16 1651    Clinical Impression Statement Continued assessment of LTG; pt has met 5/6 LTG with pt partially meeting DGI.  Pt has shown improvement in LE strength, balance, gait and endurance but continues to present with impaired dynamic balance during more challenging mobility tasks (stairs, obstacles, head turns)-therapist recommending supervision for stair negotiation and community mobility.  Pt to continue with HEP.  Pt pleased with progress and ready for D/C today.   Rehab Potential Good   Clinical Impairments Affecting Rehab Potential see past medical history above   PT Treatment/Interventions ADLs/Self Care Home Management;DME Instruction;Gait training;Stair training;Functional mobility training;Therapeutic activities;Therapeutic exercise;Balance training;Neuromuscular re-education;Cognitive remediation;Patient/family education;Vestibular;Energy conservation   PT Next Visit Plan D/C today   Consulted and Agree with Plan of Care Patient      Patient will benefit from skilled therapeutic intervention in order to improve the following deficits and impairments:  Decreased activity tolerance, Decreased balance, Decreased cognition, Decreased coordination, Decreased endurance, Decreased knowledge of use of DME, Decreased mobility, Decreased strength, Impaired perceived functional ability, Impaired flexibility, Postural dysfunction  Visit Diagnosis: Unsteadiness on feet  Muscle weakness (generalized)  Other abnormalities of  gait and mobility       G-Codes - 08/07/2016 1653    Functional Assessment Tool Used (Outpatient Only) DGI 17/24, five times sit to stand, gait velocity   Functional Limitation Mobility: Walking and moving around   Mobility: Walking and Moving Around Goal Status [phone]) At least 1 percent but less than 20 percent impaired, limited or restricted   Mobility: Walking and Moving Around Discharge Status [phone]) At least 20 percent but less than 40 percent impaired, limited or restricted     PHYSICAL THERAPY DISCHARGE SUMMARY  Visits from Start of Care: 17  Current functional level related to goals / functional outcomes: See goals above   Remaining deficits: Impaired strength, dynamic balance, gait and cognition   Education / Equipment: HEP  Plan: Patient agrees to discharge.  Patient goals were met. Patient is being discharged due to meeting the stated rehab goals.  ?????      Problem List There are no active problems to display for this patient.  Raylene Everts, PT, DPT 08-07-16    4:56 PM    [facility] [address] [place], [place], [address] Phone: [phone]   Fax:  [phone]  Name: Gwenyth Dingee MRN: [id number] Date of Birth: [birth year]

## 2016-07-17 ENCOUNTER — Ambulatory Visit: Payer: Medicare Other | Admitting: Rehabilitation

## 2016-07-20 ENCOUNTER — Ambulatory Visit: Payer: Medicare Other | Admitting: Rehabilitation

## 2016-07-20 ENCOUNTER — Encounter: Payer: Medicare Other | Admitting: Speech Pathology

## 2016-07-23 ENCOUNTER — Ambulatory Visit: Payer: Medicare Other | Admitting: Rehabilitation

## 2016-07-23 ENCOUNTER — Encounter: Payer: Medicare Other | Admitting: Speech Pathology

## 2016-08-12 DIAGNOSIS — F250 Schizoaffective disorder, bipolar type: Secondary | ICD-10-CM | POA: Diagnosis not present

## 2016-09-16 DIAGNOSIS — Z136 Encounter for screening for cardiovascular disorders: Secondary | ICD-10-CM | POA: Diagnosis not present

## 2016-09-16 DIAGNOSIS — D649 Anemia, unspecified: Secondary | ICD-10-CM | POA: Diagnosis not present

## 2016-09-16 DIAGNOSIS — Z79899 Other long term (current) drug therapy: Secondary | ICD-10-CM | POA: Diagnosis not present

## 2016-09-16 DIAGNOSIS — Z8673 Personal history of transient ischemic attack (TIA), and cerebral infarction without residual deficits: Secondary | ICD-10-CM | POA: Diagnosis not present

## 2016-09-23 DIAGNOSIS — H40023 Open angle with borderline findings, high risk, bilateral: Secondary | ICD-10-CM | POA: Diagnosis not present

## 2016-09-23 DIAGNOSIS — H2513 Age-related nuclear cataract, bilateral: Secondary | ICD-10-CM | POA: Diagnosis not present

## 2016-09-23 DIAGNOSIS — H524 Presbyopia: Secondary | ICD-10-CM | POA: Diagnosis not present

## 2016-11-04 DIAGNOSIS — F250 Schizoaffective disorder, bipolar type: Secondary | ICD-10-CM | POA: Diagnosis not present

## 2016-11-11 DIAGNOSIS — Z79899 Other long term (current) drug therapy: Secondary | ICD-10-CM | POA: Diagnosis not present

## 2016-11-25 DIAGNOSIS — H2513 Age-related nuclear cataract, bilateral: Secondary | ICD-10-CM | POA: Diagnosis not present

## 2016-11-25 DIAGNOSIS — H40023 Open angle with borderline findings, high risk, bilateral: Secondary | ICD-10-CM | POA: Diagnosis not present

## 2017-02-03 DIAGNOSIS — F250 Schizoaffective disorder, bipolar type: Secondary | ICD-10-CM | POA: Diagnosis not present

## 2017-03-03 DIAGNOSIS — H40023 Open angle with borderline findings, high risk, bilateral: Secondary | ICD-10-CM | POA: Diagnosis not present

## 2017-03-03 DIAGNOSIS — H2513 Age-related nuclear cataract, bilateral: Secondary | ICD-10-CM | POA: Diagnosis not present

## 2017-03-24 DIAGNOSIS — R635 Abnormal weight gain: Secondary | ICD-10-CM | POA: Diagnosis not present

## 2017-03-24 DIAGNOSIS — D649 Anemia, unspecified: Secondary | ICD-10-CM | POA: Diagnosis not present

## 2017-03-24 DIAGNOSIS — M25561 Pain in right knee: Secondary | ICD-10-CM | POA: Diagnosis not present

## 2017-03-24 DIAGNOSIS — N183 Chronic kidney disease, stage 3 (moderate): Secondary | ICD-10-CM | POA: Diagnosis not present

## 2017-03-24 DIAGNOSIS — Z79899 Other long term (current) drug therapy: Secondary | ICD-10-CM | POA: Diagnosis not present

## 2017-03-24 DIAGNOSIS — F259 Schizoaffective disorder, unspecified: Secondary | ICD-10-CM | POA: Diagnosis not present

## 2017-03-24 DIAGNOSIS — Z8673 Personal history of transient ischemic attack (TIA), and cerebral infarction without residual deficits: Secondary | ICD-10-CM | POA: Diagnosis not present

## 2017-03-24 DIAGNOSIS — E669 Obesity, unspecified: Secondary | ICD-10-CM | POA: Diagnosis not present

## 2017-04-02 DIAGNOSIS — M25551 Pain in right hip: Secondary | ICD-10-CM | POA: Diagnosis not present

## 2017-04-02 DIAGNOSIS — M1711 Unilateral primary osteoarthritis, right knee: Secondary | ICD-10-CM | POA: Diagnosis not present

## 2017-04-02 DIAGNOSIS — M25561 Pain in right knee: Secondary | ICD-10-CM | POA: Diagnosis not present

## 2017-04-16 DIAGNOSIS — D649 Anemia, unspecified: Secondary | ICD-10-CM | POA: Diagnosis not present

## 2017-04-16 DIAGNOSIS — Z79899 Other long term (current) drug therapy: Secondary | ICD-10-CM | POA: Diagnosis not present

## 2017-04-16 DIAGNOSIS — F259 Schizoaffective disorder, unspecified: Secondary | ICD-10-CM | POA: Diagnosis not present

## 2017-04-16 DIAGNOSIS — E669 Obesity, unspecified: Secondary | ICD-10-CM | POA: Diagnosis not present

## 2017-04-16 DIAGNOSIS — M25561 Pain in right knee: Secondary | ICD-10-CM | POA: Diagnosis not present

## 2017-04-16 DIAGNOSIS — R635 Abnormal weight gain: Secondary | ICD-10-CM | POA: Diagnosis not present

## 2017-04-16 DIAGNOSIS — Z8673 Personal history of transient ischemic attack (TIA), and cerebral infarction without residual deficits: Secondary | ICD-10-CM | POA: Diagnosis not present

## 2017-04-16 DIAGNOSIS — N183 Chronic kidney disease, stage 3 (moderate): Secondary | ICD-10-CM | POA: Diagnosis not present

## 2017-04-20 DIAGNOSIS — M25561 Pain in right knee: Secondary | ICD-10-CM | POA: Diagnosis not present

## 2017-04-23 DIAGNOSIS — M25561 Pain in right knee: Secondary | ICD-10-CM | POA: Diagnosis not present

## 2017-04-28 DIAGNOSIS — M25561 Pain in right knee: Secondary | ICD-10-CM | POA: Diagnosis not present

## 2017-04-30 DIAGNOSIS — M25561 Pain in right knee: Secondary | ICD-10-CM | POA: Diagnosis not present

## 2017-05-04 DIAGNOSIS — M25561 Pain in right knee: Secondary | ICD-10-CM | POA: Diagnosis not present

## 2017-05-06 DIAGNOSIS — M25561 Pain in right knee: Secondary | ICD-10-CM | POA: Diagnosis not present

## 2017-05-11 DIAGNOSIS — M25561 Pain in right knee: Secondary | ICD-10-CM | POA: Diagnosis not present

## 2017-05-12 DIAGNOSIS — M1711 Unilateral primary osteoarthritis, right knee: Secondary | ICD-10-CM | POA: Diagnosis not present

## 2017-05-12 DIAGNOSIS — M25561 Pain in right knee: Secondary | ICD-10-CM | POA: Diagnosis not present

## 2017-06-01 DIAGNOSIS — L988 Other specified disorders of the skin and subcutaneous tissue: Secondary | ICD-10-CM | POA: Diagnosis not present

## 2017-06-01 DIAGNOSIS — R600 Localized edema: Secondary | ICD-10-CM | POA: Diagnosis not present

## 2017-07-21 DIAGNOSIS — F250 Schizoaffective disorder, bipolar type: Secondary | ICD-10-CM | POA: Diagnosis not present

## 2017-08-27 ENCOUNTER — Emergency Department (HOSPITAL_COMMUNITY): Payer: Medicare Other

## 2017-08-27 ENCOUNTER — Emergency Department (HOSPITAL_COMMUNITY)
Admission: EM | Admit: 2017-08-27 | Discharge: 2017-08-28 | Disposition: A | Discharge status: Home / Self Care | Payer: Medicare Other | Source: Home / Self Care | Attending: Emergency Medicine | Admitting: Emergency Medicine

## 2017-08-27 ENCOUNTER — Encounter (HOSPITAL_COMMUNITY): Payer: Self-pay | Admitting: *Deleted

## 2017-08-27 ENCOUNTER — Other Ambulatory Visit: Payer: Self-pay

## 2017-08-27 DIAGNOSIS — G8929 Other chronic pain: Secondary | ICD-10-CM

## 2017-08-27 DIAGNOSIS — S199XXA Unspecified injury of neck, initial encounter: Secondary | ICD-10-CM | POA: Diagnosis not present

## 2017-08-27 DIAGNOSIS — M25512 Pain in left shoulder: Secondary | ICD-10-CM

## 2017-08-27 DIAGNOSIS — M25561 Pain in right knee: Secondary | ICD-10-CM | POA: Diagnosis not present

## 2017-08-27 DIAGNOSIS — Z7982 Long term (current) use of aspirin: Secondary | ICD-10-CM | POA: Insufficient documentation

## 2017-08-27 DIAGNOSIS — W19XXXA Unspecified fall, initial encounter: Secondary | ICD-10-CM | POA: Diagnosis not present

## 2017-08-27 DIAGNOSIS — Z8673 Personal history of transient ischemic attack (TIA), and cerebral infarction without residual deficits: Secondary | ICD-10-CM

## 2017-08-27 DIAGNOSIS — S4992XA Unspecified injury of left shoulder and upper arm, initial encounter: Secondary | ICD-10-CM | POA: Diagnosis not present

## 2017-08-27 DIAGNOSIS — Z79899 Other long term (current) drug therapy: Secondary | ICD-10-CM | POA: Insufficient documentation

## 2017-08-27 DIAGNOSIS — M25519 Pain in unspecified shoulder: Secondary | ICD-10-CM | POA: Diagnosis not present

## 2017-08-27 DIAGNOSIS — S0990XA Unspecified injury of head, initial encounter: Secondary | ICD-10-CM | POA: Diagnosis not present

## 2017-08-27 HISTORY — DX: Cerebral infarction, unspecified: I63.9

## 2017-08-27 MED ORDER — ACETAMINOPHEN 500 MG PO TABS: 1000.0000 mg | ORAL_TABLET | Freq: Once | ORAL | Status: DC

## 2017-08-27 NOTE — ED Triage Notes (Signed)
Pt bib EMS and coming from an independent living facility [phone] [facility] [address]. Richarda Osmond). Pt was walking to the bathroom when she tripped and fell onto her left shoulder. Pt report pain on the shoulder and pt denies LOC.  Pt denies being on blood thinners. Pt a/o x 4. Rates pain a 9 out of 10 on movement.     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 Ambulates with a cane  150/88, HR; 88, RR: 16

## 2017-08-27 NOTE — ED Provider Notes (Addendum)
[facility] DEPT Provider Note   CSN: [id number] Arrival date & time: 08/27/17  2147     History   Chief Complaint Chief Complaint  Patient presents with  . Fall    HPI Bridget Jackson is a 67 y.o. female.  Patient presents to the ED with a chief complaint of fall.  She states that she walks with a walker after having had a stroke in the past.  She states that she has some chronic right knee pain, and thinks that the combination of residual weakness from the stroke and the chronic right knee pain caused her to stumble and fall tonight.  She states that she landed on her left shoulder, and complains of pain when she tries to lift her shoulder.  She lives in an independent living center.  She denies head injuries, but is also nervous that she may have had another stroke a while back because she thinks that her legs have been weaker recently.  The history is provided by the patient. No language interpreter was used.    Past Medical History:  Diagnosis Date  . Stroke [facility])     There are no active problems to display for this patient.   History reviewed. No pertinent surgical history.   OB History   None      Home Medications    Prior to Admission medications   Medication Sig Start Date End Date Taking? Authorizing Provider  acetaminophen (TYLENOL) 650 MG CR tablet Take 650 mg by mouth every 6 (six) hours as needed for pain.    [provider]  aspirin 81 MG chewable tablet Chew by mouth daily.    [provider]  atorvastatin (LIPITOR) 10 MG tablet Take 10 mg by mouth daily.    [provider]  butamben-tetracaine-benzocaine (CETACAINE) 03-06-12 % spray Apply 1 spray topically as needed for pain.    [provider]  carvedilol (COREG) 3.125 MG tablet Take 3.125 mg by mouth 2 (two) times daily with a meal.    [provider]  clopidogrel (PLAVIX) 75 MG tablet Take 75 mg by mouth daily.    [provider]  ferrous sulfate 325 (65 FE) MG tablet Take 325 mg by mouth daily with breakfast.    [provider]  fluPHENAZine (PROLIXIN) 5 MG/ML solution Take by mouth at bedtime.    [provider]  fluticasone (FLONASE) 50 MCG/ACT nasal spray Place 1 spray into both nostrils daily.    [provider]  latanoprost (XALATAN) 0.005 % ophthalmic solution 1 drop daily.    [provider]  lithium 300 MG tablet Take 300 mg by mouth 2 (two) times daily.    [provider]  LORazepam (ATIVAN) 1 MG tablet Take 1 mg by mouth at bedtime.    [provider]  traZODone (DESYREL) 50 MG tablet Take 50 mg by mouth at bedtime.    [provider]    Family History No family history on file.  Social History Social History   Tobacco Use  . Smoking status: Never Smoker  . Smokeless tobacco: Never Used  Substance Use Topics  . Alcohol use: Not Currently  . Drug use: Never     Allergies   Provera [medroxyprogesterone acetate]   Review of Systems Review of Systems  All other systems reviewed and are negative.    Physical Exam Updated Vital Signs BP 118/61 (BP Location: Right Arm)   Pulse 76   Temp 97.7 F (  36.5 C) (Oral)   Resp 16   SpO2 97%   Physical Exam  Constitutional: She is oriented to person, place, and time. She appears well-developed and well-nourished.  HENT:  Head: Normocephalic and atraumatic.  No evidence of traumatic head injury  Eyes: Pupils are equal, round, and reactive to light. Conjunctivae and EOM are normal.  Neck: Normal range of motion. Neck supple.  Cardiovascular: Normal rate and regular rhythm. Exam reveals no gallop and no friction rub.  No murmur heard. Pulmonary/Chest: Effort normal and breath sounds normal. No respiratory distress. She has no wheezes. She has no rales. She exhibits no tenderness.  Abdominal: Soft. Bowel sounds are normal. She exhibits no distension and no mass. There is  no tenderness. There is no rebound and no guarding.  Musculoskeletal: Normal range of motion. She exhibits no edema or tenderness.  Mild left shoulder ttp, no visible deformity Lower extremities are without noticeable abnormality with PROM   Neurological: She is alert and oriented to person, place, and time.  Skin: Skin is warm and dry.  No lacerations or contusions  Psychiatric: She has a normal mood and affect. Her behavior is normal. Judgment and thought content normal.  Nursing note and vitals reviewed.    ED Treatments / Results  Labs (all labs ordered are listed, but only abnormal results are displayed) Labs Reviewed - No data to display  EKG None  Radiology No results found.  Procedures Procedures (including critical care time)  Medications Ordered in ED Medications - No data to display   Initial Impression / Assessment and Plan / ED Course  I have reviewed the triage vital signs and the nursing notes.  Pertinent labs & imaging results that were available during my care of the patient were reviewed by me and considered in my medical decision making (see chart for details).     Patient with probable mechanical fall tonight.  Complains of left shoulder pain.  Will check imaging of shoulder and will also get CT head given worsening weakness over the past several weeks.  Laboratory work-up is reassuring.  Patient ambulates for me.  Patient seen by discussed with Dr. Leonette Monarch, who agrees with plan.  Patient with reassuring CT and plain film results.  We will discharged home.  Recommend orthopedic follow-up.    Final Clinical Impressions(s) / ED Diagnoses   Final diagnoses:  Fall, initial encounter    ED Discharge Orders    None       Montine Circle, PA-C 08/28/17 0001    Montine Circle, PA-C 08/28/17 0321    Virgel Manifold, MD 08/29/17 [phone]

## 2017-08-27 NOTE — ED Notes (Signed)
Bed: [facility] Expected date:  Expected time:  Means of arrival:  Comments: 65F Fall-shoulder pain

## 2017-08-27 NOTE — ED Notes (Signed)
Pt in radiology 

## 2017-08-28 LAB — URINALYSIS, ROUTINE W REFLEX MICROSCOPIC
BILIRUBIN URINE: NEGATIVE
Bacteria, UA: NONE SEEN
GLUCOSE, UA: NEGATIVE mg/dL
HGB URINE DIPSTICK: NEGATIVE
KETONES UR: NEGATIVE mg/dL
NITRITE: NEGATIVE
PH: 7 (ref 5.0–8.0)
Protein, ur: NEGATIVE mg/dL
Specific Gravity, Urine: 1.004 — ABNORMAL LOW (ref 1.005–1.030)

## 2017-08-28 LAB — BASIC METABOLIC PANEL
ANION GAP: 9 (ref 5–15)
BUN: 28 mg/dL — ABNORMAL HIGH (ref 8–23)
CO2: 22 mmol/L (ref 22–32)
Calcium: 9.6 mg/dL (ref 8.9–10.3)
Chloride: 113 mmol/L — ABNORMAL HIGH (ref 98–111)
Creatinine, Ser: 1.44 mg/dL — ABNORMAL HIGH (ref 0.44–1.00)
GFR calc Af Amer: 42 mL/min — ABNORMAL LOW (ref >60)
GFR, EST NON AFRICAN AMERICAN: 37 mL/min — AB (ref >60)
Glucose, Bld: 140 mg/dL — ABNORMAL HIGH (ref 70–99)
POTASSIUM: 4 mmol/L (ref 3.5–5.1)
Sodium: 144 mmol/L (ref 135–145)

## 2017-08-28 LAB — CBC
HCT: 38.2 % (ref 36.0–46.0)
Hemoglobin: 12.2 g/dL (ref 12.0–15.0)
MCH: 31.3 pg (ref 26.0–34.0)
MCHC: 31.9 g/dL (ref 30.0–36.0)
MCV: 97.9 fL (ref 78.0–100.0)
PLATELETS: 308 10*3/uL (ref 150–400)
RBC: 3.9 MIL/uL (ref 3.87–5.11)
RDW: 13.3 % (ref 11.5–15.5)
WBC: 13.2 10*3/uL — AB (ref 4.0–10.5)

## 2017-08-28 LAB — LITHIUM LEVEL: Lithium Lvl: 0.72 mmol/L (ref 0.60–1.20)

## 2017-08-30 ENCOUNTER — Other Ambulatory Visit: Payer: Self-pay

## 2017-08-30 ENCOUNTER — Encounter (HOSPITAL_COMMUNITY): Payer: Self-pay | Admitting: Emergency Medicine

## 2017-08-30 ENCOUNTER — Inpatient Hospital Stay (HOSPITAL_COMMUNITY)
Admission: EM | Admit: 2017-08-30 | Discharge: 2017-09-01 | DRG: 690 | Disposition: A | Discharge status: Home Health | Payer: Medicare Other | Source: Other Acute Inpatient Hospital | Attending: Internal Medicine | Admitting: Internal Medicine

## 2017-08-30 ENCOUNTER — Emergency Department (HOSPITAL_COMMUNITY): Payer: Medicare Other

## 2017-08-30 DIAGNOSIS — Z79891 Long term (current) use of opiate analgesic: Secondary | ICD-10-CM

## 2017-08-30 DIAGNOSIS — Z66 Do not resuscitate: Secondary | ICD-10-CM | POA: Diagnosis present

## 2017-08-30 DIAGNOSIS — I69351 Hemiplegia and hemiparesis following cerebral infarction affecting right dominant side: Secondary | ICD-10-CM

## 2017-08-30 DIAGNOSIS — F258 Other schizoaffective disorders: Secondary | ICD-10-CM | POA: Diagnosis not present

## 2017-08-30 DIAGNOSIS — I10 Essential (primary) hypertension: Secondary | ICD-10-CM | POA: Diagnosis not present

## 2017-08-30 DIAGNOSIS — Z79899 Other long term (current) drug therapy: Secondary | ICD-10-CM

## 2017-08-30 DIAGNOSIS — R531 Weakness: Secondary | ICD-10-CM | POA: Diagnosis not present

## 2017-08-30 DIAGNOSIS — M25512 Pain in left shoulder: Secondary | ICD-10-CM | POA: Diagnosis not present

## 2017-08-30 DIAGNOSIS — Z9181 History of falling: Secondary | ICD-10-CM

## 2017-08-30 DIAGNOSIS — R8281 Pyuria: Secondary | ICD-10-CM | POA: Diagnosis present

## 2017-08-30 DIAGNOSIS — N390 Urinary tract infection, site not specified: Secondary | ICD-10-CM | POA: Diagnosis not present

## 2017-08-30 DIAGNOSIS — F259 Schizoaffective disorder, unspecified: Secondary | ICD-10-CM | POA: Diagnosis present

## 2017-08-30 DIAGNOSIS — R29898 Other symptoms and signs involving the musculoskeletal system: Secondary | ICD-10-CM

## 2017-08-30 DIAGNOSIS — R251 Tremor, unspecified: Secondary | ICD-10-CM | POA: Diagnosis not present

## 2017-08-30 DIAGNOSIS — R4182 Altered mental status, unspecified: Secondary | ICD-10-CM | POA: Diagnosis not present

## 2017-08-30 DIAGNOSIS — R29818 Other symptoms and signs involving the nervous system: Secondary | ICD-10-CM | POA: Diagnosis not present

## 2017-08-30 DIAGNOSIS — Z888 Allergy status to other drugs, medicaments and biological substances status: Secondary | ICD-10-CM

## 2017-08-30 DIAGNOSIS — Z7902 Long term (current) use of antithrombotics/antiplatelets: Secondary | ICD-10-CM

## 2017-08-30 DIAGNOSIS — R5381 Other malaise: Secondary | ICD-10-CM | POA: Diagnosis not present

## 2017-08-30 DIAGNOSIS — E785 Hyperlipidemia, unspecified: Secondary | ICD-10-CM | POA: Diagnosis not present

## 2017-08-30 DIAGNOSIS — M48061 Spinal stenosis, lumbar region without neurogenic claudication: Secondary | ICD-10-CM | POA: Diagnosis present

## 2017-08-30 HISTORY — DX: Essential (primary) hypertension: I10

## 2017-08-30 HISTORY — DX: Schizoaffective disorder, unspecified: F25.9

## 2017-08-30 HISTORY — DX: Hyperlipidemia, unspecified: E78.5

## 2017-08-30 HISTORY — DX: Pyuria: R82.81

## 2017-08-30 LAB — MAGNESIUM: Magnesium: 2.1 mg/dL (ref 1.7–2.4)

## 2017-08-30 LAB — CBC
HCT: 37.1 % (ref 36.0–46.0)
Hemoglobin: 11.5 g/dL — ABNORMAL LOW (ref 12.0–15.0)
MCH: 30.8 pg (ref 26.0–34.0)
MCHC: 31 g/dL (ref 30.0–36.0)
MCV: 99.5 fL (ref 78.0–100.0)
Platelets: 289 10*3/uL (ref 150–400)
RBC: 3.73 MIL/uL — ABNORMAL LOW (ref 3.87–5.11)
RDW: 12.8 % (ref 11.5–15.5)
WBC: 8.9 10*3/uL (ref 4.0–10.5)

## 2017-08-30 LAB — CBG MONITORING, ED: Glucose-Capillary: 101 mg/dL — ABNORMAL HIGH (ref 70–99)

## 2017-08-30 LAB — URINALYSIS, ROUTINE W REFLEX MICROSCOPIC
Bilirubin Urine: NEGATIVE
Glucose, UA: NEGATIVE mg/dL
Hgb urine dipstick: NEGATIVE
Ketones, ur: NEGATIVE mg/dL
Nitrite: NEGATIVE
Protein, ur: NEGATIVE mg/dL
Specific Gravity, Urine: 1.004 — ABNORMAL LOW (ref 1.005–1.030)
pH: 7 (ref 5.0–8.0)

## 2017-08-30 LAB — BASIC METABOLIC PANEL
Anion gap: 8 (ref 5–15)
BUN: 15 mg/dL (ref 8–23)
CO2: 23 mmol/L (ref 22–32)
Calcium: 9.8 mg/dL (ref 8.9–10.3)
Chloride: 111 mmol/L (ref 98–111)
Creatinine, Ser: 1.09 mg/dL — ABNORMAL HIGH (ref 0.44–1.00)
GFR calc Af Amer: 59 mL/min — ABNORMAL LOW (ref >60)
GFR calc non Af Amer: 51 mL/min — ABNORMAL LOW (ref >60)
Glucose, Bld: 101 mg/dL — ABNORMAL HIGH (ref 70–99)
Potassium: 4.1 mmol/L (ref 3.5–5.1)
Sodium: 142 mmol/L (ref 135–145)

## 2017-08-30 MED ORDER — SODIUM CHLORIDE 0.9 % IV SOLN
1.0000 g | Freq: Once | INTRAVENOUS | Status: AC
  Administered 2017-08-30: 1 g via INTRAVENOUS
  Filled 2017-08-30: qty 10

## 2017-08-30 NOTE — ED Provider Notes (Signed)
Patient placed in Quick Look pathway, seen and evaluated   Chief Complaint: Generalized weakness  HPI:   Family reports over the last 4 to 5 days she is becoming increasingly weak and having difficulty walking and with shuffling gait.  History of cerebellar stroke with similar symptoms.  Last known normal was at least 4 days ago.  Family feels that today walking has gotten significantly worse and she is not able to get up and around her apartment at her independent living facility even with her walker.  Was seen at [facility] over the weekend for a fall, at this time had normal head and cervical spine CT.  ROS: + Weakness, gait issue  Physical Exam:   Gen: No distress  Neuro: Awake and Alert  Skin: Warm    Focused Exam:  Speech is clear, able to follow commands CN III-XII intact Normal strength in upper and lower extremities bilaterally including dorsiflexion and plantar flexion, strong and equal grip strength Sensation normal to light and sharp touch Moves extremities very slowly No pronator drift    Initiation of care has begun. The patient has been counseled on the process, plan, and necessity for staying for the completion/evaluation, and the remainder of the medical screening examination    Janet Berlin 08/30/17 1527    Duffy Bruce, MD 08/30/17 [phone]

## 2017-08-30 NOTE — ED Notes (Signed)
[name] is her baby sister wanted to leave her number  [phone]

## 2017-08-30 NOTE — H&P (Signed)
Bridget Jackson CLE:[id number] DOB: 06/30/1950 DOA: 08/30/2017     PCP: Kathyrn Lass, MD   Outpatient Specialists:  NONE   Patient arrived to ER on 08/30/17 at 1458  Patient coming from:    inDependent living    Chief Complaint:  Chief Complaint  Patient presents with  . Fall    HPI: Bridget Jackson is a 67 y.o. 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  Of note patient had a fall   26 July Included negative CT of the head therapeutic lithium level at that time no fractures noted.  Then have had progressive generalized fatigue worse over the past 24 hours patient has been living in independent living facility been walking with a walker but recently has not been able to tolerate. 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  Regarding pertinent Chronic problems: History of CVA with residual right-sided weakness on Plavix Lipidemia on Lipitor  While in ER: MRi of the brain no CVA reviewed by neurology on lithium level normal 3 days ago was not repeated,  in ER noted to have possible UTI.  started on Rocephin  Following Medications were ordered in ER: Medications  cefTRIAXone (ROCEPHIN) 1 g in sodium chloride 0.9 % 100 mL IVPB (has no administration in time range)    Significant initial  Findings: Abnormal Labs Reviewed  BASIC METABOLIC PANEL - Abnormal; Notable for the following components:      Result Value   Glucose, Bld 101 (*)    Creatinine, Ser 1.09 (*)    GFR calc non Af Amer 51 (*)    GFR calc Af Amer 59 (*)    All other components within normal limits  CBC - Abnormal; Notable for the following components:   RBC 3.73 (*)    Hemoglobin 11.5 (*)    All other components within normal  limits  URINALYSIS, ROUTINE W REFLEX MICROSCOPIC - Abnormal; Notable for the following components:   Color, Urine STRAW (*)    Specific Gravity, Urine 1.004 (*)    Leukocytes, UA LARGE (*)    Bacteria, UA RARE (*)    All other components within normal limits  CBG MONITORING, ED - Abnormal; Notable for the following components:   Glucose-Capillary 101 (*)    All other components within normal limits     Na 142 K 4.1  Cr   stable,    Lab Results  Component Value Date   CREATININE 1.09 (H) 08/30/2017   CREATININE 1.44 (H) 08/28/2017   WBC 8.9  HG/HCT  stable,       Component Value Date/Time   HGB 11.5 (L) 08/30/2017 1537   HCT 37.1 08/30/2017 1537     UA elevated white blood cell count CT HEAD from 3 days ago NON acute MRI brain nonacute   ECG:  Personally reviewed by me showing: HR : 74 Rhythm:  NSR with PAC   no evidence of ischemic changes QTC 434  ED Triage Vitals  Enc Vitals Group     BP 08/30/17 1506 118/66     Pulse Rate 08/30/17 1506 74     Resp 08/30/17 1506 16     Temp 08/30/17 1506 98.1 F (36.7 C)     Temp Source  08/30/17 1506 Oral     SpO2 08/30/17 1506 98 %     Weight --      Height --      Head Circumference --      Peak Flow --      Pain Score 08/30/17 1507 0     Pain Loc --      Pain Edu? --      Excl. in [place]? --   TMAX(24)@       Latest  Blood pressure (!) 102/51, pulse 69, temperature 98.4 F (36.9 C), temperature source Oral, resp. rate 14, SpO2 96 %.  Hospitalist was called for admission for debility possible UTI   Review of Systems:    Pertinent positives include: fatigue,  Constitutional:  No weight loss, night sweats, Fevers, chills,  weight loss  HEENT:  No headaches, Difficulty swallowing,Tooth/dental problems,Sore throat,  No sneezing, itching, ear ache, nasal congestion, post nasal drip,  Cardio-vascular:  No chest pain, Orthopnea, PND, anasarca, dizziness, palpitations.no Bilateral lower extremity swelling  GI:  No  heartburn, indigestion, abdominal pain, nausea, vomiting, diarrhea, change in bowel habits, loss of appetite, melena, blood in stool, hematemesis Resp:  no shortness of breath at rest. No dyspnea on exertion, No excess mucus, no productive cough, No non-productive cough, No coughing up of blood.No change in color of mucus.No wheezing. Skin:  no rash or lesions. No jaundice GU:  no dysuria, change in color of urine, no urgency or frequency. No straining to urinate.  No flank pain.  Musculoskeletal:  No joint pain or no joint swelling. No decreased range of motion. No back pain.  Psych:  No change in mood or affect. No depression or anxiety. No memory loss.  Neuro: no localizing neurological complaints, no tingling, no weakness, no double vision, no gait abnormality, no slurred speech, no confusion  All systems reviewed and apart from [place] all are negative  Past Medical History:   Past Medical History:  Diagnosis Date  . Stroke [facility])       History reviewed. No pertinent surgical history.  Social History:  Ambulatory  walker       reports that she has never smoked. She has never used smokeless tobacco. She reports that she drank alcohol. She reports that she does not use drugs.     Family History:   Family History  Problem Relation Age of Onset  . Stomach cancer Father   . Cancer Sister   . Diabetes Other     Allergies: Allergies  Allergen Reactions  . Provera [Medroxyprogesterone Acetate]      Prior to Admission medications   Medication Sig Start Date End Date Taking? Authorizing Provider  acetaminophen (TYLENOL) 500 MG tablet Take 500 mg by mouth 3 (three) times daily.    [provider]  atorvastatin (LIPITOR) 10 MG tablet Take 10 mg by mouth daily.    [provider]  carvedilol (COREG) 3.125 MG tablet Take 3.125 mg by mouth 2 (two) times daily with a meal.    [provider]  clopidogrel (PLAVIX) 75 MG tablet Take 75 mg by mouth  daily.    [provider]  ferrous sulfate 325 (65 FE) MG tablet Take 325 mg by mouth daily with breakfast.    [provider]  fluPHENAZine (PROLIXIN) 1 MG tablet Take 4 mg by mouth at bedtime.    [provider]  lithium 300 MG tablet Take 300 mg by mouth 2 (two) times daily.    [provider]  LORazepam (ATIVAN) 1 MG tablet Take 1 mg by mouth at bedtime.    [provider]  Multiple Vitamins-Minerals (MULTIVITAMIN ADULTS) TABS Take 1 tablet by mouth daily.    [provider]  traZODone (DESYREL) 50 MG tablet Take 50 mg by mouth at bedtime.    [provider]   Physical Exam: Blood pressure (!) 102/51, pulse 69, temperature 98.4 F (36.9 C), temperature source Oral, resp. rate 14, SpO2 96 %. 1. General:  in No Acute distress   Chronically ill -appearing 2. Psychological: Alert and   Oriented 3. Head/ENT:     Dry Mucous Membranes                          Head Non traumatic, neck supple                          Poor Dentition 4. SKIN:  decreased Skin turgor,  Skin clean Dry and intact no rash 5. Heart: Regular rate and rhythm no  Murmur, no Rub or gallop 6. Lungs:  no wheezes or crackles   7. Abdomen: Soft, non-tender, Non distended   obese  bowel sounds present 8. Lower extremities: no clubbing, cyanosis, or  edema 9. Neurologically diminished on the right but with bilateral leg weakness  cranial nerves II through XII intact 10. MSK: Normal range of motion except in left shoulder diminished with increased pain    LABS:     Recent Labs  Lab 08/28/17 0113 08/30/17 1537  WBC 13.2* 8.9  HGB 12.2 11.5*  HCT 38.2 37.1  MCV 97.9 99.5  PLT 308 532   Basic Metabolic Panel: Recent Labs  Lab 08/28/17 0113 08/30/17 1537  NA 144 142  K 4.0 4.1  CL 113* 111  CO2 22 23  GLUCOSE 140* 101*  BUN 28* 15  CREATININE 1.44* 1.09*  CALCIUM 9.6 9.8      No results for input(s): AST, ALT, ALKPHOS, BILITOT, PROT, ALBUMIN  in the last 168 hours. No results for input(s): LIPASE, AMYLASE in the last 168 hours. No results for input(s): AMMONIA in the last 168 hours.    HbA1C: No results for input(s): HGBA1C in the last 72 hours. CBG: Recent Labs  Lab 08/30/17 1816  GLUCAP 101*      Urine analysis:    Component Value Date/Time   COLORURINE STRAW (A) 08/30/2017 1830   APPEARANCEUR CLEAR 08/30/2017 1830   LABSPEC 1.004 (L) 08/30/2017 1830   PHURINE 7.0 08/30/2017 1830   GLUCOSEU NEGATIVE 08/30/2017 1830   HGBUR NEGATIVE 08/30/2017 1830   BILIRUBINUR NEGATIVE 08/30/2017 1830   KETONESUR NEGATIVE 08/30/2017 1830   PROTEINUR NEGATIVE 08/30/2017 1830   NITRITE NEGATIVE 08/30/2017 1830   LEUKOCYTESUR LARGE (A) 08/30/2017 1830       Cultures: No results found for: SDES, SPECREQUEST, CULT, REPTSTATUS   Radiological Exams on Admission: Mr Brain Wo Contrast  Result Date: 08/30/2017 CLINICAL DATA:  Focal neuro deficit, > 6 hrs, stroke suspected EXAM: MRI HEAD WITHOUT CONTRAST TECHNIQUE: Multiplanar, multiecho pulse sequences of the brain and surrounding structures were obtained without intravenous contrast. COMPARISON:  Head CT 08/27/2017 FINDINGS: BRAIN: There is no acute infarct, acute hemorrhage or mass effect. The midline structures are normal. There are no old infarcts. The white matter signal is normal for the patient's age. Generalized atrophy without lobar predilection. Prominent perivascular space of the left lentiform nucleus. Susceptibility-sensitive sequences show  no chronic microhemorrhage or superficial siderosis. VASCULAR: Major intracranial arterial and venous sinus flow voids are preserved. SKULL AND UPPER CERVICAL SPINE: The visualized skull base, calvarium, upper cervical spine and extracranial soft tissues are normal. SINUSES/ORBITS: No fluid levels or advanced mucosal thickening. No mastoid or middle ear effusion. The orbits are normal. IMPRESSION: Mildly advanced atrophy for age without  acute intracranial abnormality. Electronically Signed   By: Ulyses Jarred M.D.   On: 08/30/2017 20:50    Chart has been reviewed    Assessment/Plan  67 y.o. 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 HTN, HLD Admitted for debility and pyuria  Present on Admission: . Pyuria also urine culture patient already given dose of Rocephin in ER . Debility will have PT OT evaluate gently rehydrate, patient denies any back pain MRI of the brain unremarkable, given bilateral lower ext weakness will obtain lumbar spine MRi . Schizoaffective disorder ([place])  -stable continue home medication check lithium level no recent medication change . Hyperlipidemia  -stable continue home medication . Essential hypertension -stable continue home medication . Left shoulder pain - obtain MRi to evaluate for ligamentous injusry Recurrent falls with diminished ability to ambulate will have physical therapy assess family is not interested in placement would rather have home health physical therapy  Other plan as per orders.  DVT prophylaxis:  SCD    Code Status:    DNR/DNI  as per patient   I had personally discussed CODE STATUS with patient and family  Family Communication:   Family   at  Bedside  plan of care was discussed with   Sister,    Disposition Plan:     likely will need placement for rehabilitation                                             Would benefit from PT/OT eval prior to DC  Ordered                    Social work consult                    Consults called: none   Admission status:  obs   Level of care       medical floor              Toy Baker 08/30/2017, 12:45 AM    [facility]  Pager [phone]   after 2 AM please page floor coverage PA If 7AM-7PM, please contact the day team taking care of the patient  [email or web address]  Password [email or web address]

## 2017-08-30 NOTE — ED Notes (Signed)
Pt placed on pur wick

## 2017-08-30 NOTE — Progress Notes (Addendum)
CSW met with pt and pt's sister at bedside. Pt gave verbal permission to speak with pt's sister. Pt stated that pt's sister, [name] is the best contact. CSW reviewed criteria for Medicare to cover SNF placement. Pt and pt's sister voiced understanding. Pt's sister stated she wants pt to return to the [facility] because she really likes it there. Pt and pt's sister are open to home health. [facility] contract with [facility].   CSW will leave handoff with RN CM to follow up about home health needs and eligibility for [place] [place] [facility]  [phone]

## 2017-08-30 NOTE — ED Notes (Signed)
ED Provider at bedside. 

## 2017-08-30 NOTE — Clinical Social Work Note (Signed)
Clinical Social Work Assessment  Patient Details  Name: Bridget Jackson MRN: [id number] Date of Birth: [birth year]  Date of referral:  08/30/17               Reason for consult:  Facility Placement                Permission sought to share information with:  Case Manager, Family Supports Permission granted to share information::     Name::     [name] (sister) [phone]  Agency::     Relationship::     Contact Information:     Housing/Transportation Living arrangements for the past 2 months:  Independent Living Facility([facility]) Source of Information:  Patient Patient Interpreter Needed:  None Criminal Activity/Legal Involvement Pertinent to Current Situation/Hospitalization:  No - Comment as needed Significant Relationships:  Siblings Lives with:  [place]) Do you feel safe going back to the place where you live?  Yes Need for family participation in patient care:  Yes (Comment)  Care giving concerns:  Pt consulted for SNF placement due to pt unable to safely ambulate suddenly.   Social Worker assessment / plan:  CSW met with pt' and pt's sister, [name], at bedside. Pt is a resident at [facility]. Pt had been successfully getting around with a walker and a cane until the past two weeks. CSW explained insurance barriers to SNF placement. Pt and family understanding. Pt and pt's sister open to home health through [facility]. [facility] contracts with [facility] [employer]. Pt sister would like for pt to remain at the [facility]. CSW will send referral to [facility] to see what additional services pt is eligible for.   Employment status:    Insurance information:  Medicare, Other (Comment Required)(BCBS supplement) PT Recommendations:  Not assessed at this time Information / Referral to community resources:  Other (Comment Required)(Home Health)  Patient/Family's Response to care:  Pt and pt's family agreeable to plan of care. Pt and pt's  sister are open to home health and understanding that pt is not likely to be admitted for 3 days to qualify for SNF to be covered by Medicare.   Patient/Family's Understanding of and Emotional Response to Diagnosis, Current Treatment, and Prognosis:  Pt and pt's sister did not have any questions or concerns for this CSW at this time.   Emotional Assessment Appearance:  Appears stated age Attitude/Demeanor/Rapport:    Affect (typically observed):  Accepting, Adaptable, Appropriate Orientation:  Oriented to Self, Oriented to  Time, Oriented to Place, Oriented to Situation Alcohol / Substance use:    Psych involvement (Current and /or in the community):  No (Comment)  Discharge Needs  Concerns to be addressed:  Discharge Planning Concerns, Home Safety Concerns Readmission within the last 30 days:  No Current discharge risk:  Lives alone Barriers to Discharge:  Continued Medical Work up   [employer], LCSW 08/30/2017, 9:59 PM

## 2017-08-30 NOTE — ED Triage Notes (Signed)
Family complains of increasing weakness over the last several weeks, significantly worse in the last 24 hours. Family states patient had a stroke last year and after rehab, was living in an independent living facility without issue. Patient is alert, oriented, and in no apparent distress.

## 2017-08-30 NOTE — ED Notes (Signed)
Patient transported to MRI 

## 2017-08-30 NOTE — ED Provider Notes (Signed)
[place] EMERGENCY DEPARTMENT Provider Note   CSN: [id number] Arrival date & time: 08/30/17  1458     History   Chief Complaint Chief Complaint  Patient presents with  . Fall    HPI Bridget Jackson is a 66 y.o. female.  HPI   67 year old female presenting after fall.  She has a past history of cerebellar stroke. This left her with some persistent difficulties with her gait but she improved to the point of living independently and walking with a walker.  3 days ago she fell and was evaluated in the emergency room.  She had a reassuring work-up at that time including a CT of her head and therapeutic lithium level.  She has had some persistent pain in her left shoulder and right knee since that time but otherwise seem to be doing okay until this morning.  She went to bed around 11 PM last night more or less in her usual state of health.  This morning when using the bath around 8 AM she was very weak and needed a lot of assistance.  She felt like both of her legs were weak, right greater than left and that she was off balance.  Her sister is at bedside.  She reports that her speech is baseline for her and R eye ptosis is chronic as well but does seem to worsen under stress or when she is fatigued.  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   Past Medical History:  Diagnosis Date  . Stroke [facility])     There are no active problems to display for this patient.   History reviewed. No pertinent surgical history.   OB History   None      Home Medications    Prior to Admission medications   Medication Sig Start Date End Date Taking? Authorizing Provider  acetaminophen (TYLENOL) 500 MG tablet Take 500 mg by mouth 3 (three) times daily.    [provider]  atorvastatin (LIPITOR) 10 MG tablet Take 10 mg by mouth daily.    [provider]  carvedilol (COREG) 3.125 MG tablet  Take 3.125 mg by mouth 2 (two) times daily with a meal.    [provider]  clopidogrel (PLAVIX) 75 MG tablet Take 75 mg by mouth daily.    [provider]  ferrous sulfate 325 (65 FE) MG tablet Take 325 mg by mouth daily with breakfast.    [provider]  fluPHENAZine (PROLIXIN) 1 MG tablet Take 4 mg by mouth at bedtime.    [provider]  lithium 300 MG tablet Take 300 mg by mouth 2 (two) times daily.    [provider]  LORazepam (ATIVAN) 1 MG tablet Take 1 mg by mouth at bedtime.    [provider]  Multiple Vitamins-Minerals (MULTIVITAMIN ADULTS) TABS Take 1 tablet by mouth daily.    [provider]  traZODone (DESYREL) 50 MG tablet Take 50 mg by mouth at bedtime.    [provider]    Family History No family history on file.  Social History Social History   Tobacco Use  . Smoking status: Never Smoker  . Smokeless tobacco: Never Used  Substance Use Topics  . Alcohol use: Not Currently  . Drug use: Never     Allergies   Provera [medroxyprogesterone acetate]   Review of Systems Review of Systems All systems reviewed and negative, other than  as noted in HPI.   Physical Exam Updated Vital Signs BP 127/77 (BP Location: Right Arm)   Pulse 71   Temp 98.4 F (36.9 C) (Oral)   Resp 14   SpO2 99%   Physical Exam  Constitutional: She is oriented to person, place, and time. She appears well-developed and well-nourished. No distress.  HENT:  Head: Normocephalic and atraumatic.  Eyes: Conjunctivae are normal. Right eye exhibits no discharge. Left eye exhibits no discharge.  Neck: Neck supple.  Cardiovascular: Normal rate, regular rhythm and normal heart sounds. Exam reveals no gallop and no friction rub.  No murmur heard. Pulmonary/Chest: Effort normal and breath sounds normal. No respiratory distress.  Abdominal: Soft. She exhibits no distension. There is no tenderness.  Musculoskeletal: She  exhibits no edema or tenderness.  Neurological: She is alert and oriented to person, place, and time.  Right eye ptosis.  Cranial nerves II through XII otherwise intact.  Speech is somewhat dysarthric, but understandable.  Strength is 5 out of 5 bilateral upper extremities.  3 out of 5 right lower extremity.  4 out of 5 left lower extremity.  Sensation is intact to light touch.  Did not attempt to ambulate.  Skin: Skin is warm and dry.  Psychiatric: She has a normal mood and affect. Her behavior is normal. Thought content normal.  Nursing note and vitals reviewed.    ED Treatments / Results  Labs (all labs ordered are listed, but only abnormal results are displayed) Labs Reviewed  BASIC METABOLIC PANEL - Abnormal; Notable for the following components:      Result Value   Glucose, Bld 101 (*)    Creatinine, Ser 1.09 (*)    GFR calc non Af Amer 51 (*)    GFR calc Af Amer 59 (*)    All other components within normal limits  CBC - Abnormal; Notable for the following components:   RBC 3.73 (*)    Hemoglobin 11.5 (*)    All other components within normal limits  URINALYSIS, ROUTINE W REFLEX MICROSCOPIC - Abnormal; Notable for the following components:   Color, Urine STRAW (*)    Specific Gravity, Urine 1.004 (*)    Leukocytes, UA LARGE (*)    Bacteria, UA RARE (*)    All other components within normal limits  CBG MONITORING, ED - Abnormal; Notable for the following components:   Glucose-Capillary 101 (*)    All other components within normal limits    EKG EKG Interpretation  Date/Time:  Monday August 30 2017 15:20:23 EDT Ventricular Rate:  74 PR Interval:    QRS Duration: 84 QT Interval:  394 QTC Calculation: 437 R Axis:   -30 Text Interpretation:  Normal sinus rhythm Premature atrial complexes Left axis deviation Cannot rule out Anterior infarct , age undetermined Abnormal ECG Confirmed by Virgel Manifold [phone]) on 08/30/2017 6:13:16 PM   Radiology No results  found.  Procedures Procedures (including critical care time)  Medications Ordered in ED Medications - No data to display   Initial Impression / Assessment and Plan / ED Course  I have reviewed the triage vital signs and the nursing notes.  Pertinent labs & imaging results that were available during my care of the patient were reviewed by me and considered in my medical decision making (see chart for details).     67 year old female with lower extremity weakness, right greater than left.  Last seem to be at her baseline around 11 PM yesterday.  She has had a head  CT without acute findings.  Her lithium level was normal at that time.  On discussion an MRI her today.  Basic labs done here in the emergency room fairly unrevealing.  Doubt there is much utility in repeating a lithium level at this time.  MRI w/o acute abnormality. Possibly has UTI which is contributing to symptoms. Pt is essentially full assist.  Will discuss with medicine for possible admission.  She will likely need placement.  Final Clinical Impressions(s) / ED Diagnoses   Final diagnoses:  Weakness  Urinary tract infection without hematuria, site unspecified    ED Discharge Orders    None      Virgel Manifold, MD 09/03/17 [phone]

## 2017-08-31 ENCOUNTER — Observation Stay (HOSPITAL_COMMUNITY): Payer: Medicare Other

## 2017-08-31 ENCOUNTER — Other Ambulatory Visit: Payer: Self-pay

## 2017-08-31 ENCOUNTER — Encounter (HOSPITAL_COMMUNITY): Payer: Self-pay | Admitting: General Practice

## 2017-08-31 DIAGNOSIS — Z79899 Other long term (current) drug therapy: Secondary | ICD-10-CM | POA: Diagnosis not present

## 2017-08-31 DIAGNOSIS — N390 Urinary tract infection, site not specified: Secondary | ICD-10-CM | POA: Diagnosis present

## 2017-08-31 DIAGNOSIS — R251 Tremor, unspecified: Secondary | ICD-10-CM | POA: Diagnosis present

## 2017-08-31 DIAGNOSIS — Z66 Do not resuscitate: Secondary | ICD-10-CM | POA: Diagnosis present

## 2017-08-31 DIAGNOSIS — S3992XA Unspecified injury of lower back, initial encounter: Secondary | ICD-10-CM | POA: Diagnosis not present

## 2017-08-31 DIAGNOSIS — M25512 Pain in left shoulder: Secondary | ICD-10-CM | POA: Diagnosis present

## 2017-08-31 DIAGNOSIS — R600 Localized edema: Secondary | ICD-10-CM | POA: Diagnosis not present

## 2017-08-31 DIAGNOSIS — M5126 Other intervertebral disc displacement, lumbar region: Secondary | ICD-10-CM | POA: Diagnosis not present

## 2017-08-31 DIAGNOSIS — M48061 Spinal stenosis, lumbar region without neurogenic claudication: Secondary | ICD-10-CM | POA: Diagnosis present

## 2017-08-31 DIAGNOSIS — Z79891 Long term (current) use of opiate analgesic: Secondary | ICD-10-CM | POA: Diagnosis not present

## 2017-08-31 DIAGNOSIS — E785 Hyperlipidemia, unspecified: Secondary | ICD-10-CM | POA: Diagnosis present

## 2017-08-31 DIAGNOSIS — R5381 Other malaise: Secondary | ICD-10-CM | POA: Diagnosis present

## 2017-08-31 DIAGNOSIS — I10 Essential (primary) hypertension: Secondary | ICD-10-CM | POA: Diagnosis present

## 2017-08-31 DIAGNOSIS — Z7902 Long term (current) use of antithrombotics/antiplatelets: Secondary | ICD-10-CM | POA: Diagnosis not present

## 2017-08-31 DIAGNOSIS — R8281 Pyuria: Secondary | ICD-10-CM

## 2017-08-31 DIAGNOSIS — Z888 Allergy status to other drugs, medicaments and biological substances status: Secondary | ICD-10-CM | POA: Diagnosis not present

## 2017-08-31 DIAGNOSIS — Z9181 History of falling: Secondary | ICD-10-CM | POA: Diagnosis not present

## 2017-08-31 DIAGNOSIS — F258 Other schizoaffective disorders: Secondary | ICD-10-CM | POA: Diagnosis not present

## 2017-08-31 DIAGNOSIS — I69351 Hemiplegia and hemiparesis following cerebral infarction affecting right dominant side: Secondary | ICD-10-CM | POA: Diagnosis not present

## 2017-08-31 DIAGNOSIS — F259 Schizoaffective disorder, unspecified: Secondary | ICD-10-CM | POA: Diagnosis present

## 2017-08-31 HISTORY — DX: Pyuria: R82.81

## 2017-08-31 LAB — COMPREHENSIVE METABOLIC PANEL
ALT: 16 U/L (ref 0–44)
ANION GAP: 7 (ref 5–15)
AST: 16 U/L (ref 15–41)
Albumin: 3.4 g/dL — ABNORMAL LOW (ref 3.5–5.0)
Alkaline Phosphatase: 55 U/L (ref 38–126)
BILIRUBIN TOTAL: 0.4 mg/dL (ref 0.3–1.2)
BUN: 14 mg/dL (ref 8–23)
CHLORIDE: 113 mmol/L — AB (ref 98–111)
CO2: 22 mmol/L (ref 22–32)
Calcium: 8.9 mg/dL (ref 8.9–10.3)
Creatinine, Ser: 1.15 mg/dL — ABNORMAL HIGH (ref 0.44–1.00)
GFR calc Af Amer: 56 mL/min — ABNORMAL LOW (ref >60)
GFR, EST NON AFRICAN AMERICAN: 48 mL/min — AB (ref >60)
Glucose, Bld: 121 mg/dL — ABNORMAL HIGH (ref 70–99)
POTASSIUM: 3.9 mmol/L (ref 3.5–5.1)
Sodium: 142 mmol/L (ref 135–145)
TOTAL PROTEIN: 5.8 g/dL — AB (ref 6.5–8.1)

## 2017-08-31 LAB — HIV ANTIBODY (ROUTINE TESTING W REFLEX): HIV Screen 4th Generation wRfx: NONREACTIVE

## 2017-08-31 LAB — MAGNESIUM: MAGNESIUM: 2 mg/dL (ref 1.7–2.4)

## 2017-08-31 LAB — CBC
HEMATOCRIT: 35.6 % — AB (ref 36.0–46.0)
HEMOGLOBIN: 11 g/dL — AB (ref 12.0–15.0)
MCH: 31 pg (ref 26.0–34.0)
MCHC: 30.9 g/dL (ref 30.0–36.0)
MCV: 100.3 fL — AB (ref 78.0–100.0)
PLATELETS: 245 10*3/uL (ref 150–400)
RBC: 3.55 MIL/uL — AB (ref 3.87–5.11)
RDW: 12.9 % (ref 11.5–15.5)
WBC: 7.2 10*3/uL (ref 4.0–10.5)

## 2017-08-31 LAB — PHOSPHORUS: PHOSPHORUS: 4 mg/dL (ref 2.5–4.6)

## 2017-08-31 LAB — MRSA PCR SCREENING: MRSA BY PCR: NEGATIVE

## 2017-08-31 LAB — VITAMIN B12: VITAMIN B 12: 523 pg/mL (ref 180–914)

## 2017-08-31 LAB — TSH: TSH: 2.799 u[IU]/mL (ref 0.350–4.500)

## 2017-08-31 LAB — LITHIUM LEVEL: Lithium Lvl: 0.5 mmol/L — ABNORMAL LOW (ref 0.60–1.20)

## 2017-08-31 MED ORDER — FLUPHENAZINE HCL 1 MG PO TABS
4.0000 mg | ORAL_TABLET | Freq: Every day | ORAL | Status: DC
  Administered 2017-08-31 (×2): 4 mg via ORAL
  Filled 2017-08-31 (×3): qty 4

## 2017-08-31 MED ORDER — CEFTRIAXONE SODIUM 1 G IJ SOLR
1.0000 g | Freq: Once | INTRAMUSCULAR | Status: AC
  Administered 2017-08-31: 1 g via INTRAVENOUS
  Filled 2017-08-31: qty 10

## 2017-08-31 MED ORDER — FERROUS SULFATE 325 (65 FE) MG PO TABS
325.0000 mg | ORAL_TABLET | Freq: Every day | ORAL | Status: DC
  Administered 2017-08-31 – 2017-09-01 (×2): 325 mg via ORAL
  Filled 2017-08-31 (×2): qty 1

## 2017-08-31 MED ORDER — ONDANSETRON HCL 4 MG PO TABS: 4.0000 mg | ORAL_TABLET | Freq: Four times a day (QID) | ORAL | Status: DC | PRN

## 2017-08-31 MED ORDER — TRAZODONE HCL 50 MG PO TABS
50.0000 mg | ORAL_TABLET | Freq: Every day | ORAL | Status: DC
  Administered 2017-08-31 (×2): 50 mg via ORAL
  Filled 2017-08-31 (×2): qty 1

## 2017-08-31 MED ORDER — ACETAMINOPHEN 650 MG RE SUPP: 650.0000 mg | Freq: Four times a day (QID) | RECTAL | Status: DC | PRN

## 2017-08-31 MED ORDER — LIDOCAINE 5 % EX PTCH
1.0000 | MEDICATED_PATCH | CUTANEOUS | Status: DC
  Filled 2017-08-31: qty 1

## 2017-08-31 MED ORDER — LORAZEPAM 1 MG PO TABS
1.0000 mg | ORAL_TABLET | Freq: Every day | ORAL | Status: DC
Start: 2017-08-31 — End: 2017-09-01
  Administered 2017-08-31 (×2): 1 mg via ORAL
  Filled 2017-08-31 (×2): qty 1

## 2017-08-31 MED ORDER — ONDANSETRON HCL 4 MG/2ML IJ SOLN: 4.0000 mg | Freq: Four times a day (QID) | INTRAMUSCULAR | Status: DC | PRN

## 2017-08-31 MED ORDER — ATORVASTATIN CALCIUM 10 MG PO TABS
10.0000 mg | ORAL_TABLET | Freq: Every day | ORAL | Status: DC
  Administered 2017-08-31 – 2017-09-01 (×2): 10 mg via ORAL
  Filled 2017-08-31 (×2): qty 1

## 2017-08-31 MED ORDER — CLOPIDOGREL BISULFATE 75 MG PO TABS
75.0000 mg | ORAL_TABLET | Freq: Every day | ORAL | Status: DC
Start: 2017-08-31 — End: 2017-09-01
  Administered 2017-08-31 – 2017-09-01 (×2): 75 mg via ORAL
  Filled 2017-08-31 (×2): qty 1

## 2017-08-31 MED ORDER — SODIUM CHLORIDE 0.9 % IV SOLN
INTRAVENOUS | Status: DC
  Administered 2017-08-31: 05:00:00 via INTRAVENOUS

## 2017-08-31 MED ORDER — LITHIUM CARBONATE 300 MG PO CAPS
300.0000 mg | ORAL_CAPSULE | Freq: Two times a day (BID) | ORAL | Status: DC
  Administered 2017-08-31 – 2017-09-01 (×4): 300 mg via ORAL
  Filled 2017-08-31 (×5): qty 1

## 2017-08-31 MED ORDER — CARVEDILOL 3.125 MG PO TABS
3.1250 mg | ORAL_TABLET | Freq: Two times a day (BID) | ORAL | Status: DC
  Administered 2017-08-31 – 2017-09-01 (×4): 3.125 mg via ORAL
  Filled 2017-08-31 (×4): qty 1

## 2017-08-31 MED ORDER — ACETAMINOPHEN 325 MG PO TABS
650.0000 mg | ORAL_TABLET | Freq: Four times a day (QID) | ORAL | Status: DC | PRN
  Administered 2017-08-31: 650 mg via ORAL
  Filled 2017-08-31: qty 2

## 2017-08-31 NOTE — ED Notes (Signed)
Attempted to give report to floor nurse , nurse not available at this time to get report at this time and per Rickita, RN , charge  Nurse not available at this time to obtain report ; going up with patient to give bed side report

## 2017-08-31 NOTE — ED Notes (Signed)
Attempted to give report to floor nurse, floor nurse not available at this time and will call back in 5 min

## 2017-08-31 NOTE — Progress Notes (Addendum)
Progress Note    Bridget Jackson  KGM:[id number] DOB: April 18, 1950  DOA: 08/30/2017 PCP: Kathyrn Lass, MD    Brief Narrative:    Medical records reviewed and are as summarized below:  Bridget Jackson is an 67 y.o. 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  Of note patient had a fall   26 July Included negative CT of the head therapeutic lithium level at that time no fractures noted.  Then have had progressive generalized fatigue worse over the past 24 hours patient has been living in independent living facility been walking with a walker but recently has not been able to tolerate. 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    Assessment/Plan:   Active Problems:   Pyuria   Debility   Schizoaffective disorder (HCC)   Hyperlipidemia   Essential hypertension   Left shoulder pain  UTI - urine culture -Rocephin  Schizoaffective disorder (HCC)   -stable  -continue home medication  -lithium level stable  Hyperlipidemia   -stable continue home medication   Essential hypertension  -stable continue home medication  Left shoulder pain  MRI pending  Spinal stenosis -1. Grade 1 L5-S1 anterolisthesis. Chronic LEFT L5 pars interarticularis defect. No acute fracture. 2. Degenerative change of the lumbar spine. Moderate canal stenosis L4-5. Mild canal stenosis L3-4 and L5-S1. 3. Neural foraminal narrowing L2-3 through L5-S1: Severe on the LEFT at L5-S1 with suspected LEFT L5 neuritis. -denies pain -sister not interested in surgery -PT -conservative treatment  Plan to d/c in AM with [place] home first    Family Communication/Anticipated D/C date and plan/Code Status   DVT prophylaxis:  scd Code Status: Full Code.  Family Communication: sister at home Disposition Plan:    Medical Consultants:    None.   Subjective:   No numbness/tingling or back pain-- just b/l foot drop  Objective:    Vitals:   08/31/17 0500 08/31/17 0530 08/31/17 0600 08/31/17 0824  BP: 113/64 119/62 107/62 111/67  Pulse: 73 72 74 81  Resp: 14 19 14 18   Temp:    98.9 F (37.2 C)  TempSrc:    Oral  SpO2: 94% 98% 98% 96%    Intake/Output Summary (Last 24 hours) at 08/31/2017 1356 Last data filed at 08/31/2017 0700 Gross per 24 hour  Intake 340 ml  Output 800 ml  Net -460 ml   There were no vitals filed for this visit.  Exam: Intact sensation and equal strength Alert, speech slow rrr Clear +BS, soft  Data Reviewed:   I have personally reviewed following labs and imaging studies:  Labs: Labs show the following:   Basic Metabolic Panel: Recent Labs  Lab 08/28/17 0113 08/30/17 1537 08/31/17 0432  NA 144 142 142  K 4.0 4.1 3.9  CL 113* 111 113*  CO2 22 23 22   GLUCOSE 140* 101* 121*  BUN 28* 15 14  CREATININE 1.44* 1.09* 1.15*  CALCIUM 9.6 9.8 8.9  MG  --  2.1 2.0  PHOS  --   --  4.0   GFR CrCl cannot be calculated (Unknown ideal weight.). Liver Function Tests: Recent Labs  Lab 08/31/17 0432  AST 16  ALT 16  ALKPHOS 55  BILITOT 0.4  PROT 5.8*  ALBUMIN  3.4*   No results for input(s): LIPASE, AMYLASE in the last 168 hours. No results for input(s): AMMONIA in the last 168 hours. Coagulation profile No results for input(s): INR, PROTIME in the last 168 hours.  CBC: Recent Labs  Lab 08/28/17 0113 08/30/17 1537 08/31/17 0432  WBC 13.2* 8.9 7.2  HGB 12.2 11.5* 11.0*  HCT 38.2 37.1 35.6*  MCV 97.9 99.5 100.3*  PLT 308 289 245   Cardiac Enzymes: No results for input(s): CKTOTAL, CKMB, CKMBINDEX, TROPONINI in the last 168 hours. BNP (last 3 results) No results for input(s): PROBNP in the last 8760 hours. CBG: Recent Labs  Lab 08/30/17 1816   GLUCAP 101*   D-Dimer: No results for input(s): DDIMER in the last 72 hours. Hgb A1c: No results for input(s): HGBA1C in the last 72 hours. Lipid Profile: No results for input(s): CHOL, HDL, LDLCALC, TRIG, CHOLHDL, LDLDIRECT in the last 72 hours. Thyroid function studies: Recent Labs    08/31/17 0432  TSH 2.799   Anemia work up: Recent Labs    08/31/17 0432  VITAMINB12 523   Sepsis Labs: Recent Labs  Lab 08/28/17 0113 08/30/17 1537 08/31/17 0432  WBC 13.2* 8.9 7.2    Microbiology No results found for this or any previous visit (from the past 240 hour(s)).  Procedures and diagnostic studies:  Mr Brain Wo Contrast  Result Date: 08/30/2017 CLINICAL DATA:  Focal neuro deficit, > 6 hrs, stroke suspected EXAM: MRI HEAD WITHOUT CONTRAST TECHNIQUE: Multiplanar, multiecho pulse sequences of the brain and surrounding structures were obtained without intravenous contrast. COMPARISON:  Head CT 08/27/2017 FINDINGS: BRAIN: There is no acute infarct, acute hemorrhage or mass effect. The midline structures are normal. There are no old infarcts. The white matter signal is normal for the patient's age. Generalized atrophy without lobar predilection. Prominent perivascular space of the left lentiform nucleus. Susceptibility-sensitive sequences show no chronic microhemorrhage or superficial siderosis. VASCULAR: Major intracranial arterial and venous sinus flow voids are preserved. SKULL AND UPPER CERVICAL SPINE: The visualized skull base, calvarium, upper cervical spine and extracranial soft tissues are normal. SINUSES/ORBITS: No fluid levels or advanced mucosal thickening. No mastoid or middle ear effusion. The orbits are normal. IMPRESSION: Mildly advanced atrophy for age without acute intracranial abnormality. Electronically Signed   By: Ulyses Jarred M.D.   On: 08/30/2017 20:50   Mr Lumbar Spine Wo Contrast  Result Date: 08/31/2017 CLINICAL DATA:  Increasing weakness and difficulty walking.  History of cerebellar stroke with similar symptoms. Recent fall. Assess for motor neuron disease. EXAM: MRI LUMBAR SPINE WITHOUT CONTRAST TECHNIQUE: Multiplanar, multisequence MR imaging of the lumbar spine was performed. No intravenous contrast was administered. COMPARISON:  None. FINDINGS: Mild motion degraded examination. SEGMENTATION: For the purposes of this report, the last well-formed intervertebral disc is reported as L5-S1. ALIGNMENT: Maintained lumbar lordosis. Grade 1 L5-S1 anterolisthesis. LEFT L5 pars interarticularis defect. Broad levoscoliosis. VERTEBRAE:Vertebral bodies are intact. Moderate L4-5 and L5-S1 disc height loss with diffuse mild disc desiccation. Multilevel mild chronic discogenic endplate changes. No acute or abnormal bone marrow signal. CONUS MEDULLARIS AND CAUDA EQUINA: Conus medullaris terminates at L1-2 and demonstrates normal morphology and signal characteristics. Cauda equina is normal in appearance though centrally displaced due to canal stenosis. PARASPINAL AND OTHER SOFT TISSUES: Nonacute. Innumerable tiny T2 hyperintensities bilateral kidneys most compatible with cysts. 2.1 cm cyst RIGHT kidney. Well distended bladder. Moderate to severe paraspinal muscle atrophy. DISC LEVELS: T12-L1: Small RIGHT subarticular disc protrusion. No canal stenosis. Moderate RIGHT neural foraminal narrowing.  L1-2: No disc bulge, canal stenosis nor neural foraminal narrowing. L2-3: Annular bulging, moderate RIGHT subarticular disc protrusion. No canal stenosis though, mild narrowing of the RIGHT lateral recess could affect the traversing RIGHT L3 nerve. Mild to moderate bilateral neural foraminal narrowing. L3-4: Small broad-based disc bulge. Mild facet arthropathy. Mild canal stenosis. Mild-to-moderate bilateral neural foraminal narrowing. L4-5: Small broad-based disc bulge. Moderate facet arthropathy and ligamentum flavum redundancy. Trace RIGHT facet effusion. Moderate canal stenosis.  Mild-to-moderate bilateral neural foraminal narrowing. L5-S1: Anterolisthesis. Moderate LEFT subarticular-extraforaminal disc protrusion. Mildly enlarged edematous exited LEFT L5 nerve. Moderate to severe RIGHT, severe LEFT facet arthropathy. LEFT lateral recess effacement likely affecting the traversing S1 nerve. Mild canal stenosis. Mild-to-moderate RIGHT, severe LEFT neural foraminal narrowing. IMPRESSION: 1. Grade 1 L5-S1 anterolisthesis. Chronic LEFT L5 pars interarticularis defect. No acute fracture. 2. Degenerative change of the lumbar spine. Moderate canal stenosis L4-5. Mild canal stenosis L3-4 and L5-S1. 3. Neural foraminal narrowing L2-3 through L5-S1: Severe on the LEFT at L5-S1 with suspected LEFT L5 neuritis. Electronically Signed   By: Elon Alas M.D.   On: 08/31/2017 02:44    Medications:   . atorvastatin  10 mg Oral Daily  . carvedilol  3.125 mg Oral BID WC  . clopidogrel  75 mg Oral Daily  . ferrous sulfate  325 mg Oral Q breakfast  . fluPHENAZine  4 mg Oral QHS  . lithium carbonate  300 mg Oral BID  . LORazepam  1 mg Oral QHS  . traZODone  50 mg Oral QHS   Continuous Infusions: . cefTRIAXone (ROCEPHIN)  IV       LOS: 0 days   Geradine Girt  [facility]   *Please refer to [email or web address], password [email or web address] to get updated schedule on who will round on this patient, as hospitalists switch teams weekly. If 7PM-7AM, please contact night-coverage at [email or web address], password [email or web address] for any overnight needs.  08/31/2017, 1:56 PM

## 2017-08-31 NOTE — Care Management Obs Status (Signed)
[place] NOTIFICATION   Patient Details  Name: Bridget Jackson MRN: [id number] Date of Birth: October 18, 1950   Medicare Observation Status Notification Given:  Yes   Explained observation letter to patient and her sister [name] ( [name] is a Education officer, museum ) at bedside . Patient and [name] both voiced understanding.   Printed hard copy and received patient signature.  Marilu Favre, RN 08/31/2017, 10:36 AM

## 2017-08-31 NOTE — ED Notes (Signed)
Patient transported to MRI 

## 2017-08-31 NOTE — Social Work (Signed)
CSW acknowledging SNF recommendations, per Medicare guidelines pt not eligible for SNF currently due to observation status. Patient needs a medical need that is being treated that qualifies for inpatient status (and remains medically necessary to remain inpatient for 3 midnights).  CSW signing off. Please consult if any additional needs arise.  Alexander Mt, [place] Work [phone]

## 2017-08-31 NOTE — Progress Notes (Signed)
08/31/17 1201  PT Visit Information  Last PT Received On 08/31/17  Assistance Needed +2 (+2 for chair follow)  History of Present Illness Bridget Jackson is a 67 y.o. F who presented to the ED 7/29 after persistent weakness and increasing difficulty with ADLs. PMH includes CVA with residual right sided weakness, HTN, HLD. Pt has a recent visit to the Ed 08/27/2017 after a fall. CT and MRI showed no acute abnormality.   Precautions  Precautions Fall  Restrictions  Weight Bearing Restrictions No  Home Living  Family/patient expects to be discharged to: Private residence  Living Arrangements Alone  Available Help at Discharge Family;Available PRN/intermittently  Type of Home Independent living facility  Home Access Level entry  Home Layout One level  Bathroom Shower/Tub Walk-in shower  Bathroom Toilet Handicapped [place] - 2 wheels;Walker - 4 wheels;Shower seat - built in;Grab bars - tub/shower;Grab bars - toilet;Toilet riser;Wheelchair - manual;Cane - single point  Additional Comments pt resides in [place], has most recently been staying with her sister since her fall on 7/26  Prior Function  Level of Independence Needs assistance  Gait / Transfers Assistance Needed mostly using rollator for ambulation; reports intermittently using SPC within apartment  ADL's / Homemaking Assistance Needed receives supervision/minA from sister for bathing; sister assists with iADLs including providing meals   Communication  Communication No difficulties  Pain Assessment  Pain Assessment Faces  Faces Pain Scale 4  Pain Location L Shoulder  Pain Descriptors / Indicators Guarding;Grimacing  Pain Intervention(s) Limited activity within patient's tolerance;Monitored during session;Repositioned  Cognition  Arousal/Alertness Awake/alert  Behavior During Therapy Flat affect  Overall Cognitive Status Impaired/Different from baseline  Area of Impairment Following commands;Problem  solving  Following Commands Follows one step commands consistently;Follows one step commands with increased time  Problem Solving Slow processing;Decreased initiation;Requires verbal cues;Requires tactile cues  General Comments Pt was able to follow cues for MMT. Pt had slow processing for commands and decreased initation. Pt had delay in answering questions, and inconsistent reporting; pt reported that L LE felt weaker, and then reported R LE felt weaker.   Upper Extremity Assessment  Upper Extremity Assessment RUE deficits/detail;LUE deficits/detail  RUE Deficits / Details pt had difficulty fully extending arms during flexion. When PT brought pt through full ROM, pt had difficulty holding arms against gravity.   RUE Sensation WNL  LUE Deficits / Details pt reports pain in L shoulder, and could not fully extend elbow during shoulder flexion. L UE seems weaker than R UE.   LUE Sensation WNL  Lower Extremity Assessment  Lower Extremity Assessment LLE deficits/detail;RLE deficits/detail  RLE Deficits / Details Pt reports coordination with R LE is "harder." PT clarified if it was due to pain/limitations in the hip, and pt said "no, it's just harder." Likely residual to past CVA. Pt scored a 4/5 on hip flexion and 4/5 on quad MMT. PT observed no noticeable difficulty in coordination in R LE despite pt reports. Pt had normal sensation.   RLE Sensation WNL  RLE Coordination WNL  LLE Deficits / Details Pt scored a 3/5 on hip flexion and and 3+/5 on quad MMT. Pt is weaker during MMT on L side, but reports more fear while standing of R leg buckling.   LLE Sensation WNL  LLE Coordination WNL  Cervical / Trunk Assessment  Cervical / Trunk Assessment Kyphotic  Bed Mobility  General bed mobility comments Pt in chair upon arrival  Transfers  Overall transfer level  Needs assistance  Equipment used Rolling walker (2 wheeled)  Transfers Sit to/from Stand  Sit to Stand Min assist;+2 physical assistance   General transfer comment Pt required MinA+2 for sit<>stand transfer. Pt had difficulty initiating and required more assist for initial power up. Pt reported no dizziness with standing up, and that it felt "Good" to stand. Pt had no visible LOB with transfer, but pt did flex heavily at the trunk to stand. Pt required cues for hand placement.   Ambulation/Gait  Ambulation/Gait assistance Min assist;Min guard;+2 safety/equipment (+2 for chair follow)  Gait Distance (Feet) 15 Feet  Assistive device Rolling walker (2 wheeled)  Gait Pattern/deviations Step-to pattern;Decreased step length - left;Decreased stance time - right;Decreased stride length;Decreased dorsiflexion - left;Shuffle;Trunk flexed  General Gait Details Pt ambulated with MinA-MinG, a chair follow, and RW. Pt ambulated with trunk flexed and required cues to stand up tall and look up while ambulating. Pt took short steps, and was reluctant to put weight on R leg, forcing L leg to swing through quickily and uncontrolled in short shuffle steps. Pt had no visible LOB during ambulation. Gait speed improved as pt ambulated. Pt had difficulty navigating and avoiding obstacles. Pt required verbal and tactile cues for safe use of RW, for direction during ambulation.    Gait velocity Decreased  Balance  Overall balance assessment Needs assistance  Sitting-balance support Feet supported;No upper extremity supported  Sitting balance-Leahy Scale Fair  Standing balance support Bilateral upper extremity supported  Standing balance-Leahy Scale Poor  Standing balance comment Pt reliant on UE support for balance. Pt also reliant on MinA from PT for balance. Pt swayed some anterior-posterior during static standing.   General Comments  General comments (skin integrity, edema, etc.) Younger sister was present, and answered most questions about home life.   Exercises  Exercises General Lower Extremity  General Exercises - Lower Extremity  Hip  Flexion/Marching Seated;5 reps;Both;AROM  PT - End of Session  Equipment Utilized During Treatment Gait belt  Activity Tolerance Patient tolerated treatment well  Patient left in chair;with call bell/phone within reach;with chair alarm set;with family/visitor present  Nurse Communication Mobility status  PT Assessment  PT Recommendation/Assessment Patient needs continued PT services  PT Visit Diagnosis Unsteadiness on feet (R26.81);Other abnormalities of gait and mobility (R26.89);Muscle weakness (generalized) (M62.81);History of falling (Z91.81);Difficulty in walking, not elsewhere classified (R26.2)  PT Problem List Decreased strength;Decreased range of motion;Decreased balance;Decreased mobility;Decreased knowledge of use of DME;Pain;Decreased cognition  Barriers to Discharge Decreased caregiver support  PT Plan  PT Frequency (ACUTE ONLY) Min 3X/week  PT Treatment/Interventions (ACUTE ONLY) DME instruction;Gait training;Functional mobility training;Therapeutic activities;Therapeutic exercise;Balance training;Patient/family education  AM-PAC PT "6 Clicks" Daily Activity Outcome Measure  Difficulty turning over in bed (including adjusting bedclothes, sheets and blankets)? 1  Difficulty moving from lying on back to sitting on the side of the bed?  1  Difficulty sitting down on and standing up from a chair with arms (e.g., wheelchair, bedside commode, etc,.)? 1  Help needed moving to and from a bed to chair (including a wheelchair)? 3  Help needed walking in hospital room? 3  Help needed climbing 3-5 steps with a railing?  2  6 Click Score 11  Mobility G Code  CL  PT Recommendation  Follow Up Recommendations SNF;Supervision/Assistance - 24 hour  PT equipment None recommended by PT  Individuals Consulted  Consulted and Agree with Results and Recommendations Patient  Acute Rehab PT Goals  Patient Stated Goal To play bingo again  PT  Goal Formulation With patient/family  Time For Goal  Achievement 09/14/17  Potential to Achieve Goals Good  PT Time Calculation  PT Start Time (ACUTE ONLY) 1130  PT Stop Time (ACUTE ONLY) 1158  PT Time Calculation (min) (ACUTE ONLY) 28 min  PT General Charges  $$ ACUTE PT VISIT 1 Visit  PT Evaluation  $PT Eval Moderate Complexity 1 Mod  PT Treatments  $Therapeutic Activity 8-22 mins  Written Expression  Dominant Hand Right    Pt presents with problems above and deficits below. Pt performed sit<>stand transfers and ambulated with MinG-MinA and RW. Pt has increased weakness on L side, but reports fear of R LE "buckling" when ambulating. Pt and sister educated on safety with mobility and using WC at home to decrease falls. Feel patient would benefit from SNF at d/c, however, may not be eligible. If not eligible, would be good candidate for [facility] as pt will need 24/7 assist initially upon d/c.Will continue to follow acutely to support independence, mobility, and safety.     Elwin Mocha, S-DPT [place] Student [phone]

## 2017-08-31 NOTE — Evaluation (Signed)
Occupational Therapy Evaluation Patient Details Name: Bridget Jackson MRN: [id number] DOB: 1950-04-23 Today's Date: 08/31/2017    History of Present Illness  67 y.o. female with medical history significant of remote CVA with residual right-sided weakness. Of note patient had a fall 26 July Included negative CT of the head therapeutic lithium level at that time and no fractures noted. Pt has had progressive generalized fatigue becoming worse over the past 24 hours prior to this admission.   Clinical Impression   This 67 y/o female presents with the above, is pleasant and motivated to work with therapy this session. At baseline pt is mod independent with functional mobility using rollator, receives supervision from sister for bathing ADLs and assist for iADLs, was otherwise completing ADLs with mod independence, though reports increased difficulty with ADL completion most recently. Pt required modA for bed mobility, minA for stand pivot transfers this session using RW; pt presenting with generalized weakness and fatigue with minimal activity. Pt currently requires minA for UB ADLs, maxA for LB ADLs. Pt also with decreased use of LUE due to shoulder pain from recent fall at home. Pt will benefit from continued acute OT services and recommend follow up [facility] services after discharge to maximize her safety and independence with ADLs and mobility. Pt may benefit from HomeFirst program with [facility] if eligible. Will follow.     Follow Up Recommendations  Home health OT;Supervision/Assistance - 24 hour;Other (comment)(may benefit from [facility]First with [facility] )    Equipment Recommendations  3 in 1 bedside commode           Precautions / Restrictions Precautions Precautions: Fall Restrictions Weight Bearing Restrictions: No Other Position/Activity Restrictions: painful with wt bearing through LUE (imaging negative for fracture)      Mobility Bed Mobility Overal bed mobility: Needs Assistance Bed  Mobility: Supine to Sit     Supine to sit: Mod assist;HOB elevated     General bed mobility comments: verbal cues for initiation and sequencing with use of bed rails; assist to elevate trunk and to fully scoot hips towards EOB   Transfers Overall transfer level: Needs assistance Equipment used: Rolling walker (2 wheeled) Transfers: Sit to/from [facility] Sit to Stand: Min assist Stand pivot transfers: Min assist       General transfer comment: VCs hand placement, assist to rise and steady at RW, step-by-step cues and assist to guide RW for taking small pivotal steps to recliner    Balance Overall balance assessment: Needs assistance Sitting-balance support: Feet supported Sitting balance-Leahy Scale: Fair     Standing balance support: Bilateral upper extremity supported Standing balance-Leahy Scale: Poor Standing balance comment: reliant on UE support/external support from therapist                            ADL either performed or assessed with clinical judgement   ADL Overall ADL's : Needs assistance/impaired Eating/Feeding: Supervision/ safety;Sitting   Grooming: Set up;Min guard;Sitting Grooming Details (indicate cue type and reason): minguard sitting balance  Upper Body Bathing: Minimal assistance;Sitting   Lower Body Bathing: Moderate assistance;Sit to/from stand   Upper Body Dressing : Minimal assistance;Sitting   Lower Body Dressing: Maximal assistance;Sit to/from stand   Toilet Transfer: Minimal assistance;Stand-pivot;BSC;RW Armed forces technical officer Details (indicate cue type and reason): simuilated in transfer to [place] and Hygiene: Maximal assistance;Sit to/from stand       Functional mobility during ADLs: Minimal assistance;Rolling walker General ADL Comments: pt  performing bed mobility and transferring OOB to recliner during session; increased fatigue with minimal activity but is motivated to  return to [employer]     Praxis      Pertinent Vitals/Pain Pain Assessment: Faces Faces Pain Scale: Hurts little more Pain Location: L shoulder, R LE  Pain Descriptors / Indicators: Sore;Discomfort Pain Intervention(s): Monitored during session;Repositioned;Limited activity within patient's tolerance     Hand Dominance Right   Extremity/Trunk Assessment Upper Extremity Assessment Upper Extremity Assessment: LUE deficits/detail;RUE deficits/detail;Generalized weakness RUE Deficits / Details: residual RUE weakness from previous CVA, decreased AROM  LUE Deficits / Details: decreased AROM due to pain with ROM above approx 80* LUE: Unable to fully assess due to pain   Lower Extremity Assessment Lower Extremity Assessment: Defer to PT evaluation       Communication     Cognition Arousal/Alertness: Awake/alert Behavior During Therapy: WFL for tasks assessed/performed Overall Cognitive Status: Impaired/Different from baseline Area of Impairment: Following commands;Problem solving                       Following Commands: Follows one step commands with increased time     Problem Solving: Slow processing;Decreased initiation;Requires verbal cues;Requires tactile cues General Comments: slower processing noted and sister assisting at times with providing PLOF, pt intermittently requiring repetition of instruction to follow one step commands; suspect this may be close to pt's baseline cognition    General Comments  sister present during session     Exercises     Shoulder Instructions      Home Living Family/patient expects to be discharged to:: Assisted living(ILF ) Living Arrangements: Alone                           Home Equipment: Walker - 2 wheels;Walker - 4 wheels;Shower seat - built in;Grab bars - tub/shower;Grab bars - toilet;Toilet riser;Wheelchair - manual;Cane - single point   Additional Comments: pt resides in [place]  facility, has most recently been staying with her sister since her fall on 7/26      Prior Functioning/Environment Level of Independence: Needs assistance  Gait / Transfers Assistance Needed: mostly using rollator for ambulation; reports intermittently using SPC within apartment ADL's / Homemaking Assistance Needed: receives supervision/minA from sister for bathing; sister assists with iADLs including providing meals    Comments: pt reports increased difficulty with completing ADLs and with ambulating prior to this admission         OT Problem List: Decreased strength;Impaired balance (sitting and/or standing);Pain;Impaired UE functional use;Decreased activity tolerance      OT Treatment/Interventions:      OT Goals(Current goals can be found in the care plan section) Acute Rehab OT Goals Patient Stated Goal: regain independence  OT Goal Formulation: With patient/family Time For Goal Achievement: 09/14/17 Potential to Achieve Goals: Good  OT Frequency: Min 2X/week               Co-evaluation              AM-PAC PT "6 Clicks" Daily Activity     Outcome Measure Help from another person eating meals?: None Help from another person taking care of personal grooming?: A Little Help from another person toileting, which includes using toliet, bedpan, or urinal?: A Lot Help from another person bathing (including washing, rinsing, drying)?: A Lot Help from another person to  put on and taking off regular upper body clothing?: A Little Help from another person to put on and taking off regular lower body clothing?: A Lot 6 Click Score: 16   End of Session Equipment Utilized During Treatment: Gait belt;Rolling walker Nurse Communication: Mobility status  Activity Tolerance: Patient tolerated treatment well Patient left: in chair;with call bell/phone within reach;with family/visitor present  OT Visit Diagnosis: Muscle weakness (generalized) (M62.81)                Time:  0932-6712 OT Time Calculation (min): 33 min Charges:  OT General Charges $OT Visit: 1 Visit OT Evaluation $OT Eval Moderate Complexity: 1 Mod OT Treatments $Self Care/Home Management : 8-22 mins  Lou Cal, OT Pager [phone] 08/31/2017   Raymondo Band 08/31/2017, 11:33 AM

## 2017-08-31 NOTE — Care Management Note (Addendum)
Case Management Note  Patient Details  Name: Bridget Jackson MRN: [id number] Date of Birth: 06-24-1950  Subjective/Objective:                    Action/Plan:Update patient qualifies for Home First Program through [facility] . Tommi Rumps with [facility] will call patient and sister. Will need home health orders and face to face for RN,PT,OT,SW and aide Patient from independent living at [facility] address [address], [place] [address]   See observation notification note.   Spoke to patient and her sister Bridget Jackson ( cell [phone]) at bedside. Explained patient observation status and why , Both voiced understanding.   Debbie asked if PT recommends SNF if status would be changed to inpatient status. Again explained mobility issues does not quality for inpatient status . Patient needs a medical need  that is being treated that  Qualifies for inpatient sttaus.   Again both voiced understanding.   Explained [facility] First program . Both are very interested in [facility]. Both understand patient has to meet [facility]'s criteria for program to be accepted into program. Tommi Rumps with [facility] will call patient and Bridget Jackson and discuss program and criteria.   Await a call back from [facility] to see if patient qualifies for Home First.   If patient does not qualify for Home First , then NCM can still arrange home health services , however, patient and Bridget Jackson would have to arrange more assistance at home . Both understand all of above. And awaiting call from [facility].  Expected Discharge Date:                  Expected Discharge Plan:  [place]  In-House Referral:     Discharge planning Services  CM Consult  Post Acute Care Choice:  Home Health Choice offered to:  Patient, Sibling  DME Arranged:    DME Agency:     HH Arranged:    [place]:  [place]  Status of Service:  In process, will continue to follow  If discussed at Long Length of Stay Meetings, dates discussed:     Additional Comments:  Marilu Favre, RN 08/31/2017, 10:43 AM

## 2017-09-01 DIAGNOSIS — R5381 Other malaise: Secondary | ICD-10-CM

## 2017-09-01 DIAGNOSIS — I10 Essential (primary) hypertension: Secondary | ICD-10-CM

## 2017-09-01 DIAGNOSIS — N390 Urinary tract infection, site not specified: Principal | ICD-10-CM

## 2017-09-01 LAB — URINE CULTURE

## 2017-09-01 MED ORDER — DICLOFENAC SODIUM 1 % TD GEL
2.0000 g | Freq: Four times a day (QID) | TRANSDERMAL | Status: DC
  Administered 2017-09-01 (×2): 2 g via TOPICAL
  Filled 2017-09-01: qty 100

## 2017-09-01 MED ORDER — DICLOFENAC SODIUM 1 % TD GEL: 2.0000 g | Freq: Four times a day (QID) | TRANSDERMAL | 0 refills | Status: DC

## 2017-09-01 MED ORDER — CEPHALEXIN 500 MG PO CAPS: 500.0000 mg | ORAL_CAPSULE | Freq: Two times a day (BID) | ORAL | 0 refills | Status: AC

## 2017-09-01 MED ORDER — CEPHALEXIN 500 MG PO CAPS
500.0000 mg | ORAL_CAPSULE | Freq: Two times a day (BID) | ORAL | Status: DC
  Administered 2017-09-01: 500 mg via ORAL
  Filled 2017-09-01: qty 1

## 2017-09-01 NOTE — Discharge Summary (Signed)
Physician Discharge Summary  Bridget Jackson HQP:[id number] DOB: [birth year] DOA: 08/30/2017  PCP: Kathyrn Lass, MD  Admit date: 08/30/2017 Discharge date: 09/01/2017  Admitted From: ILF Discharge disposition: ILF with [facility] home first program   Recommendations for Outpatient Follow-Up:   1. Referral to orth/NS for spinal stenosis treatment is conservative methods fail 2. [facility] home first program   Discharge Diagnosis:   Active Problems:   Pyuria   Debility   Schizoaffective disorder ([place])   Hyperlipidemia   Essential hypertension   Left shoulder pain    Discharge Condition: Improved.  Diet recommendation: Low sodium, heart healthy  Wound care: None.  Code status: Full.   History of Present Illness:   Bridget Jackson is a 67 y.o. 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  Of note patient had a fall   26 July Included negative CT of the head therapeutic lithium level at that time no fractures noted.  Then have had progressive generalized fatigue worse over the past 24 hours patient has been living in independent living facility been walking with a walker but recently has not been able to tolerate. 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     Hospital Course by Problem:   UTI - urine culture done after abx -Rocephin- to keflex for 5 days  Schizoaffective disorder (HCC) -stable  -continue home medication  -lithium level stable  Hyperlipidemia -stable continue home medication   Essential hypertension -stable continue home medication  Left shoulder pain MRI with bursitis and contusion -voltaren gel   Spinal stenosis -1. Grade 1 L5-S1 anterolisthesis.  Chronic LEFT L5 pars interarticularis defect. No acute fracture. 2. Degenerative change of the lumbar spine. Moderate canal stenosis L4-5. Mild canal stenosis L3-4 and L5-S1. 3. Neural foraminal narrowing L2-3 through L5-S1: Severe on the LEFT at L5-S1 with suspected LEFT L5 neuritis. -denies pain -sister not interested in surgery -PT -conservative treatment- referral to ortho/NS if fails -can not have systemic steroids due to psychosis it causes     Medical Consultants:      Discharge Exam:   Vitals:   09/01/17 0932 09/01/17 1341  BP: 110/64 115/71  Pulse: 87 80  Resp: 12 19  Temp: 99.1 F (37.3 C) 98.4 F (36.9 C)  SpO2: 96% 95%   Vitals:   09/01/17 0201 09/01/17 0520 09/01/17 0932 09/01/17 1341  BP: (!) 118/58 (!) 118/59 110/64 115/71  Pulse: 84 80 87 80  Resp: 16 14 12 19   Temp: 98 F (36.7 C) 97.9 F (36.6 C) 99.1 F (37.3 C) 98.4 F (36.9 C)  TempSrc: Oral Oral Oral Oral  SpO2: 98% 97% 96% 95%    General exam: Appears calm and comfortable.  No numbness or tingling in legs LE strength markedly intact- ? Knee arthritis causing issues with walking    The results of significant diagnostics from this hospitalization (including imaging, microbiology, ancillary and laboratory) are listed below for reference.     Procedures and Diagnostic Studies:   Mr Brain Wo Contrast  Result Date: 08/30/2017 CLINICAL DATA:  Focal neuro deficit, > 6 hrs, stroke suspected EXAM: MRI HEAD WITHOUT CONTRAST TECHNIQUE: Multiplanar, multiecho pulse sequences of the brain and surrounding structures were obtained without intravenous contrast. COMPARISON:  Head CT 08/27/2017 FINDINGS: BRAIN: There is no acute infarct, acute hemorrhage or mass effect. The midline structures are normal. There are no old infarcts. The white matter signal is normal for the patient's age. Generalized atrophy without lobar predilection. Prominent perivascular space of the left lentiform nucleus.  Susceptibility-sensitive sequences show no chronic microhemorrhage or superficial siderosis. VASCULAR: Major intracranial arterial and venous sinus flow voids are preserved. SKULL AND UPPER CERVICAL SPINE: The visualized skull base, calvarium, upper cervical spine and extracranial soft tissues are normal. SINUSES/ORBITS: No fluid levels or advanced mucosal thickening. No mastoid or middle ear effusion. The orbits are normal. IMPRESSION: Mildly advanced atrophy for age without acute intracranial abnormality. Electronically Signed   By: Ulyses Jarred M.D.   On: 08/30/2017 20:50   Mr Lumbar Spine Wo Contrast  Result Date: 08/31/2017 CLINICAL DATA:  Increasing weakness and difficulty walking. History of cerebellar stroke with similar symptoms. Recent fall. Assess for motor neuron disease. EXAM: MRI LUMBAR SPINE WITHOUT CONTRAST TECHNIQUE: Multiplanar, multisequence MR imaging of the lumbar spine was performed. No intravenous contrast was administered. COMPARISON:  None. FINDINGS: Mild motion degraded examination. SEGMENTATION: For the purposes of this report, the last well-formed intervertebral disc is reported as L5-S1. ALIGNMENT: Maintained lumbar lordosis. Grade 1 L5-S1 anterolisthesis. LEFT L5 pars interarticularis defect. Broad levoscoliosis. VERTEBRAE:Vertebral bodies are intact. Moderate L4-5 and L5-S1 disc height loss with diffuse mild disc desiccation. Multilevel mild chronic discogenic endplate changes. No acute or abnormal bone marrow signal. CONUS MEDULLARIS AND CAUDA EQUINA: Conus medullaris terminates at L1-2 and demonstrates normal morphology and signal characteristics. Cauda equina is normal in appearance though centrally displaced due to canal stenosis. PARASPINAL AND OTHER SOFT TISSUES: Nonacute. Innumerable tiny T2 hyperintensities bilateral kidneys most compatible with cysts. 2.1 cm cyst RIGHT kidney. Well distended bladder. Moderate to severe paraspinal muscle atrophy. DISC LEVELS: T12-L1: Small  RIGHT subarticular disc protrusion. No canal stenosis. Moderate RIGHT neural foraminal narrowing. L1-2: No disc bulge, canal stenosis nor neural foraminal narrowing. L2-3: Annular bulging, moderate RIGHT subarticular disc protrusion. No canal stenosis though, mild narrowing of the RIGHT lateral recess could affect the traversing RIGHT L3 nerve. Mild to moderate bilateral neural foraminal narrowing. L3-4: Small broad-based disc bulge. Mild facet arthropathy. Mild canal stenosis. Mild-to-moderate bilateral neural foraminal narrowing. L4-5: Small broad-based disc bulge. Moderate facet arthropathy and ligamentum flavum redundancy. Trace RIGHT facet effusion. Moderate canal stenosis. Mild-to-moderate bilateral neural foraminal narrowing. L5-S1: Anterolisthesis. Moderate LEFT subarticular-extraforaminal disc protrusion. Mildly enlarged edematous exited LEFT L5 nerve. Moderate to severe RIGHT, severe LEFT facet arthropathy. LEFT lateral recess effacement likely affecting the traversing S1 nerve. Mild canal stenosis. Mild-to-moderate RIGHT, severe LEFT neural foraminal narrowing. IMPRESSION: 1. Grade 1 L5-S1 anterolisthesis. Chronic LEFT L5 pars interarticularis defect. No acute fracture. 2. Degenerative change of the lumbar spine. Moderate canal stenosis L4-5. Mild canal stenosis L3-4 and L5-S1. 3. Neural foraminal narrowing L2-3 through L5-S1: Severe on the LEFT at L5-S1 with suspected LEFT L5 neuritis. Electronically Signed   By: Elon Alas M.D.   On: 08/31/2017 02:44   Mr Shoulder Left Wo Contrast  Result Date: 08/31/2017 CLINICAL DATA:  Severe left shoulder pain since a trip and fall walking to the bathroom 08/27/2017. Initial encounter. EXAM: MRI OF THE LEFT SHOULDER WITHOUT CONTRAST TECHNIQUE: Multiplanar, multisequence MR imaging of the shoulder was performed. No intravenous contrast was administered. COMPARISON:  Plain films left shoulder 0 08/27/2017. FINDINGS: Rotator cuff: There is mild appearing  rotator cuff tendinopathy without tear. Muscles: There is fairly extensive edema in  the teres major muscle. Extensive edema is also seen throughout the deltoid. No focal intramuscular fluid collection is identified. No atrophy. Biceps long head:  Intact and normal in appearance. Acromioclavicular Joint: Appears normal. Type 1 acromion. A moderately large volume of fluid is seen in the subacromial/subdeltoid bursa. Glenohumeral Joint: The inferior glenohumeral ligament appears edematous and irregular but intact fibers are identified. The joint is otherwise unremarkable. Labrum:  Intact. Bones: Normal marrow signal throughout. No contusion or fracture. Small focus of mild marrow edema in the far periphery and anterior-most greater tuberosity is likely degenerative. Other: None. IMPRESSION: Extensive edema in the deltoid muscle and teres major is likely due to contusion and/or strain given history of fall. No tear is identified. Edematous and irregular appearance of the inferior glenohumeral ligament is compatible with sprain. Intact fibers are present. Negative for fracture or other acute bony abnormality. Fluid in the subacromial/subdeltoid bursa consistent with bursitis, likely posttraumatic. Intact rotator cuff and long head of biceps. Electronically Signed   By: Inge Rise M.D.   On: 08/31/2017 14:49     Labs:   Basic Metabolic Panel: Recent Labs  Lab 08/28/17 0113 08/30/17 1537 08/31/17 0432  NA 144 142 142  K 4.0 4.1 3.9  CL 113* 111 113*  CO2 22 23 22   GLUCOSE 140* 101* 121*  BUN 28* 15 14  CREATININE 1.44* 1.09* 1.15*  CALCIUM 9.6 9.8 8.9  MG  --  2.1 2.0  PHOS  --   --  4.0   GFR CrCl cannot be calculated (Unknown ideal weight.). Liver Function Tests: Recent Labs  Lab 08/31/17 0432  AST 16  ALT 16  ALKPHOS 55  BILITOT 0.4  PROT 5.8*  ALBUMIN 3.4*   No results for input(s): LIPASE, AMYLASE in the last 168 hours. No results for input(s): AMMONIA in the last 168  hours. Coagulation profile No results for input(s): INR, PROTIME in the last 168 hours.  CBC: Recent Labs  Lab 08/28/17 0113 08/30/17 1537 08/31/17 0432  WBC 13.2* 8.9 7.2  HGB 12.2 11.5* 11.0*  HCT 38.2 37.1 35.6*  MCV 97.9 99.5 100.3*  PLT 308 289 245   Cardiac Enzymes: No results for input(s): CKTOTAL, CKMB, CKMBINDEX, TROPONINI in the last 168 hours. BNP: Invalid input(s): POCBNP CBG: Recent Labs  Lab 08/30/17 1816  GLUCAP 101*   D-Dimer No results for input(s): DDIMER in the last 72 hours. Hgb A1c No results for input(s): HGBA1C in the last 72 hours. Lipid Profile No results for input(s): CHOL, HDL, LDLCALC, TRIG, CHOLHDL, LDLDIRECT in the last 72 hours. Thyroid function studies Recent Labs    08/31/17 0432  TSH 2.799   Anemia work up Recent Labs    08/31/17 [place] 523   Microbiology Recent Results (from the past 240 hour(s))  Urine culture     Status: Abnormal   Collection Time: 08/30/17  8:29 PM  Result Value Ref Range Status   Specimen Description URINE, RANDOM  Final   Special Requests   Final    NONE Performed at [facility], [place] [address] [place], [place] [address]    Culture MULTIPLE SPECIES PRESENT, SUGGEST RECOLLECTION (A)  Final   Report Status 09/01/2017 FINAL  Final  MRSA PCR Screening     Status: None   Collection Time: 08/31/17  5:00 PM  Result Value Ref Range Status   MRSA by PCR NEGATIVE NEGATIVE Final    Comment:        The GeneXpert  MRSA Assay (FDA approved for NASAL specimens only), is one component of a comprehensive MRSA colonization surveillance program. It is not intended to diagnose MRSA infection nor to guide or monitor treatment for MRSA infections. Performed at [facility], [place] [address] [place], [place] [address]      Discharge Instructions:   Discharge Instructions    Diet general   Complete by:  As directed    Discharge instructions   Complete by:  As directed    [facility] at  home program-- PT/OT/RN/aide   Increase activity slowly   Complete by:  As directed      Allergies as of 09/01/2017      Reactions   Provera [medroxyprogesterone Acetate]    Prednisone    Other reaction(s): Other      Medication List    TAKE these medications   acetaminophen 500 MG tablet Commonly known as:  TYLENOL Take 500 mg by mouth 3 (three) times daily.   atorvastatin 10 MG tablet Commonly known as:  LIPITOR Take 10 mg by mouth daily.   carvedilol 3.125 MG tablet Commonly known as:  COREG Take 3.125 mg by mouth 2 (two) times daily with a meal.   cephALEXin 500 MG capsule Commonly known as:  KEFLEX Take 1 capsule (500 mg total) by mouth 2 (two) times daily for 10 days.   clopidogrel 75 MG tablet Commonly known as:  PLAVIX Take 75 mg by mouth daily.   diclofenac sodium 1 % Gel Commonly known as:  VOLTAREN Apply 2 g topically 4 (four) times daily.   ferrous sulfate 325 (65 FE) MG tablet Take 325 mg by mouth daily with breakfast.   fluPHENAZine 1 MG tablet Commonly known as:  PROLIXIN Take 4 mg by mouth at bedtime.   lithium 300 MG tablet Take 300 mg by mouth 2 (two) times daily.   LORazepam 1 MG tablet Commonly known as:  ATIVAN Take 1 mg by mouth at bedtime.   MULTIVITAMIN ADULTS Tabs Take 1 tablet by mouth daily.   traZODone 50 MG tablet Commonly known as:  DESYREL Take 50 mg by mouth at bedtime.            Durable Medical Equipment  (From admission, onward)        Start     Ordered   09/01/17 1511  For home use only DME 3 n 1  Once     09/01/17 1510     Follow-up Information    [facility] Follow up.   Specialty:  Home Health Services Contact information: [place] [address] [phone]        Kathyrn Lass, MD Follow up in 1 week(s).   Specialty:  Family Medicine Contact information: [place] [address] [phone]            Time coordinating  discharge: 35 min  Signed:  Geradine Girt  [facility] 09/01/2017, 4:26 PM

## 2017-09-01 NOTE — Care Management Note (Signed)
Case Management Note  Patient Details  Name: Bridget Jackson MRN: [id number] Date of Birth: 07-26-1950  Subjective/Objective:                    Action/Plan: Patient discharging home with [facility] First Program .   Sister [name] requesting 3 in 1 . Same ordered through [facility]. Dan with [facility] aware patient is discharging home today and will bring to patient's room ASAP.  Expected Discharge Date:  09/01/17               Expected Discharge Plan:  [place]  In-House Referral:     Discharge planning Services  CM Consult  Post Acute Care Choice:  Home Health Choice offered to:  Patient, Sibling  DME Arranged:  3-N-1 DME Agency:  [place]:  RN, PT, OT, Nurse's Aide [place] Agency:  [place]  Status of Service:  Completed, signed off  If discussed at [place] of Stay Meetings, dates discussed:    Additional Comments:  Marilu Favre, RN 09/01/2017, 3:25 PM

## 2017-09-01 NOTE — Consult Note (Signed)
   [facility] CM Inpatient Consult   09/01/2017  Dezarai Prew May 04, 1950 [id number]    Made aware by Tommi Rumps with [facility] that Ms. Ose will enroll in the [place] program.  Martin Majestic to bedside to speak with Ms. Gayle and sister [name]. Discussed that [place] Management will assist if transportation or medication needs are identified while on the [place] program. Also discussed that [place] Management could potentially follow at later time if community case management needs are identified post [place] services.  Both patient and sister are agreeable to above notes.  Provided [facility] Care Management brochure, 24-hr nurse advice line magnet and contact information.  Both Ms. Fuhs and sister expressed appreciation of visit.  Will send notification to [place] Management office to make aware of [facility] First enrollment.   Marthenia Rolling, MSN-Ed, RN,BSN [facility] Liaison [phone]

## 2017-09-01 NOTE — Progress Notes (Signed)
Discharge instructions reviewed with pt and pt's sister and sister stated she had already picked up prescriptions.  Pt and pt's sister verbalized understanding and had no questions.  Pt discharged in stable condition via wheelchair with sister.  Eliezer [place]

## 2017-09-02 DIAGNOSIS — I69351 Hemiplegia and hemiparesis following cerebral infarction affecting right dominant side: Secondary | ICD-10-CM | POA: Diagnosis not present

## 2017-09-02 DIAGNOSIS — N390 Urinary tract infection, site not specified: Secondary | ICD-10-CM | POA: Diagnosis not present

## 2017-09-03 DIAGNOSIS — I69351 Hemiplegia and hemiparesis following cerebral infarction affecting right dominant side: Secondary | ICD-10-CM | POA: Diagnosis not present

## 2017-09-03 DIAGNOSIS — N390 Urinary tract infection, site not specified: Secondary | ICD-10-CM | POA: Diagnosis not present

## 2017-09-06 DIAGNOSIS — I69351 Hemiplegia and hemiparesis following cerebral infarction affecting right dominant side: Secondary | ICD-10-CM | POA: Diagnosis not present

## 2017-09-06 DIAGNOSIS — N390 Urinary tract infection, site not specified: Secondary | ICD-10-CM | POA: Diagnosis not present

## 2017-09-08 DIAGNOSIS — N390 Urinary tract infection, site not specified: Secondary | ICD-10-CM | POA: Diagnosis not present

## 2017-09-08 DIAGNOSIS — I69351 Hemiplegia and hemiparesis following cerebral infarction affecting right dominant side: Secondary | ICD-10-CM | POA: Diagnosis not present

## 2017-09-09 DIAGNOSIS — I69351 Hemiplegia and hemiparesis following cerebral infarction affecting right dominant side: Secondary | ICD-10-CM | POA: Diagnosis not present

## 2017-09-09 DIAGNOSIS — N390 Urinary tract infection, site not specified: Secondary | ICD-10-CM | POA: Diagnosis not present

## 2017-09-10 DIAGNOSIS — N390 Urinary tract infection, site not specified: Secondary | ICD-10-CM | POA: Diagnosis not present

## 2017-09-10 DIAGNOSIS — I69351 Hemiplegia and hemiparesis following cerebral infarction affecting right dominant side: Secondary | ICD-10-CM | POA: Diagnosis not present

## 2017-09-13 DIAGNOSIS — N183 Chronic kidney disease, stage 3 (moderate): Secondary | ICD-10-CM | POA: Diagnosis not present

## 2017-09-13 DIAGNOSIS — R0683 Snoring: Secondary | ICD-10-CM | POA: Diagnosis not present

## 2017-09-13 DIAGNOSIS — R531 Weakness: Secondary | ICD-10-CM | POA: Diagnosis not present

## 2017-09-14 DIAGNOSIS — N390 Urinary tract infection, site not specified: Secondary | ICD-10-CM | POA: Diagnosis not present

## 2017-09-14 DIAGNOSIS — I69351 Hemiplegia and hemiparesis following cerebral infarction affecting right dominant side: Secondary | ICD-10-CM | POA: Diagnosis not present

## 2017-09-15 DIAGNOSIS — M7542 Impingement syndrome of left shoulder: Secondary | ICD-10-CM | POA: Diagnosis not present

## 2017-09-15 DIAGNOSIS — M1711 Unilateral primary osteoarthritis, right knee: Secondary | ICD-10-CM | POA: Diagnosis not present

## 2017-09-15 DIAGNOSIS — M25512 Pain in left shoulder: Secondary | ICD-10-CM | POA: Diagnosis not present

## 2017-09-15 DIAGNOSIS — I69351 Hemiplegia and hemiparesis following cerebral infarction affecting right dominant side: Secondary | ICD-10-CM | POA: Diagnosis not present

## 2017-09-15 DIAGNOSIS — M25561 Pain in right knee: Secondary | ICD-10-CM | POA: Diagnosis not present

## 2017-09-15 DIAGNOSIS — N390 Urinary tract infection, site not specified: Secondary | ICD-10-CM | POA: Diagnosis not present

## 2017-09-16 DIAGNOSIS — I69351 Hemiplegia and hemiparesis following cerebral infarction affecting right dominant side: Secondary | ICD-10-CM | POA: Diagnosis not present

## 2017-09-16 DIAGNOSIS — N390 Urinary tract infection, site not specified: Secondary | ICD-10-CM | POA: Diagnosis not present

## 2017-09-17 DIAGNOSIS — I69351 Hemiplegia and hemiparesis following cerebral infarction affecting right dominant side: Secondary | ICD-10-CM | POA: Diagnosis not present

## 2017-09-17 DIAGNOSIS — N390 Urinary tract infection, site not specified: Secondary | ICD-10-CM | POA: Diagnosis not present

## 2017-09-20 DIAGNOSIS — N390 Urinary tract infection, site not specified: Secondary | ICD-10-CM | POA: Diagnosis not present

## 2017-09-20 DIAGNOSIS — I69351 Hemiplegia and hemiparesis following cerebral infarction affecting right dominant side: Secondary | ICD-10-CM | POA: Diagnosis not present

## 2017-09-21 DIAGNOSIS — N390 Urinary tract infection, site not specified: Secondary | ICD-10-CM | POA: Diagnosis not present

## 2017-09-21 DIAGNOSIS — I69351 Hemiplegia and hemiparesis following cerebral infarction affecting right dominant side: Secondary | ICD-10-CM | POA: Diagnosis not present

## 2017-09-22 DIAGNOSIS — I69351 Hemiplegia and hemiparesis following cerebral infarction affecting right dominant side: Secondary | ICD-10-CM | POA: Diagnosis not present

## 2017-09-22 DIAGNOSIS — N390 Urinary tract infection, site not specified: Secondary | ICD-10-CM | POA: Diagnosis not present

## 2017-09-23 DIAGNOSIS — I69351 Hemiplegia and hemiparesis following cerebral infarction affecting right dominant side: Secondary | ICD-10-CM | POA: Diagnosis not present

## 2017-09-23 DIAGNOSIS — N390 Urinary tract infection, site not specified: Secondary | ICD-10-CM | POA: Diagnosis not present

## 2017-09-27 DIAGNOSIS — I69351 Hemiplegia and hemiparesis following cerebral infarction affecting right dominant side: Secondary | ICD-10-CM | POA: Diagnosis not present

## 2017-09-27 DIAGNOSIS — N390 Urinary tract infection, site not specified: Secondary | ICD-10-CM | POA: Diagnosis not present

## 2017-09-28 DIAGNOSIS — I69351 Hemiplegia and hemiparesis following cerebral infarction affecting right dominant side: Secondary | ICD-10-CM | POA: Diagnosis not present

## 2017-09-28 DIAGNOSIS — N390 Urinary tract infection, site not specified: Secondary | ICD-10-CM | POA: Diagnosis not present

## 2017-09-29 DIAGNOSIS — Z1159 Encounter for screening for other viral diseases: Secondary | ICD-10-CM | POA: Diagnosis not present

## 2017-09-29 DIAGNOSIS — Z0000 Encounter for general adult medical examination without abnormal findings: Secondary | ICD-10-CM | POA: Diagnosis not present

## 2017-09-29 DIAGNOSIS — Z8673 Personal history of transient ischemic attack (TIA), and cerebral infarction without residual deficits: Secondary | ICD-10-CM | POA: Diagnosis not present

## 2017-09-29 DIAGNOSIS — D649 Anemia, unspecified: Secondary | ICD-10-CM | POA: Diagnosis not present

## 2017-09-29 DIAGNOSIS — I129 Hypertensive chronic kidney disease with stage 1 through stage 4 chronic kidney disease, or unspecified chronic kidney disease: Secondary | ICD-10-CM | POA: Diagnosis not present

## 2017-09-29 DIAGNOSIS — Z23 Encounter for immunization: Secondary | ICD-10-CM | POA: Diagnosis not present

## 2017-09-29 DIAGNOSIS — Z1211 Encounter for screening for malignant neoplasm of colon: Secondary | ICD-10-CM | POA: Diagnosis not present

## 2017-09-29 DIAGNOSIS — F259 Schizoaffective disorder, unspecified: Secondary | ICD-10-CM | POA: Diagnosis not present

## 2017-09-29 DIAGNOSIS — R531 Weakness: Secondary | ICD-10-CM | POA: Diagnosis not present

## 2017-09-29 DIAGNOSIS — E559 Vitamin D deficiency, unspecified: Secondary | ICD-10-CM | POA: Diagnosis not present

## 2017-09-29 DIAGNOSIS — N183 Chronic kidney disease, stage 3 (moderate): Secondary | ICD-10-CM | POA: Diagnosis not present

## 2017-10-01 DIAGNOSIS — I69351 Hemiplegia and hemiparesis following cerebral infarction affecting right dominant side: Secondary | ICD-10-CM | POA: Diagnosis not present

## 2017-10-01 DIAGNOSIS — N390 Urinary tract infection, site not specified: Secondary | ICD-10-CM | POA: Diagnosis not present

## 2017-10-07 ENCOUNTER — Encounter: Payer: Self-pay | Admitting: Neurology

## 2017-10-07 ENCOUNTER — Ambulatory Visit (INDEPENDENT_AMBULATORY_CARE_PROVIDER_SITE_OTHER): Payer: Medicare Other | Admitting: Neurology

## 2017-10-07 VITALS — BP 125/73 | HR 75 | Ht 63.0 in | Wt 184.0 lb

## 2017-10-07 DIAGNOSIS — I63319 Cerebral infarction due to thrombosis of unspecified middle cerebral artery: Secondary | ICD-10-CM

## 2017-10-07 DIAGNOSIS — G4730 Sleep apnea, unspecified: Secondary | ICD-10-CM | POA: Diagnosis not present

## 2017-10-07 DIAGNOSIS — T733XXA Exhaustion due to excessive exertion, initial encounter: Secondary | ICD-10-CM

## 2017-10-07 DIAGNOSIS — G4719 Other hypersomnia: Secondary | ICD-10-CM | POA: Diagnosis not present

## 2017-10-07 DIAGNOSIS — R351 Nocturia: Secondary | ICD-10-CM

## 2017-10-07 DIAGNOSIS — N390 Urinary tract infection, site not specified: Secondary | ICD-10-CM | POA: Diagnosis not present

## 2017-10-07 DIAGNOSIS — F251 Schizoaffective disorder, depressive type: Secondary | ICD-10-CM | POA: Diagnosis not present

## 2017-10-07 DIAGNOSIS — I69351 Hemiplegia and hemiparesis following cerebral infarction affecting right dominant side: Secondary | ICD-10-CM | POA: Diagnosis not present

## 2017-10-07 NOTE — Progress Notes (Signed)
SLEEP MEDICINE CLINIC   Provider:  Larey Jackson, M D  Primary Care Physician:  Bridget Lass, MD   Referring Provider: Kathyrn Lass, MD Bridget Jackson , Bridget Jackson , [place].    Chief Complaint  Patient presents with  . New Patient (Initial Visit)    pt with sister, rm 41. pt states that she snores in sleep and also she gasp for air. she had a stroke march of last year. she is gradually getting better. pt states that she wakes up every 2 hours and goes to the restroom. denies having a sleep study.     HPI:  Bridget Jackson is a 67 y.o. female , seen here on 10-07-2017  in a referral for sleep consultation from Bridget Jackson.  In preparation of today's visit I received a big stack of papers relating to Bridget Jackson most recent health history.  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  4 months ago she purchased a seated walker.  6 weeks ago she suffered a fall , was hpspitalized, had undergone MRI brain and spine (Bridget Jackson) and was found not to have another stroke .  About a month ago she was seen by Bridget Jackson, where her cholesterol-lowering medications were discontinued and she has noted an almost immediate recovery less pain, less weakness less soreness and more mobility. She is also on 27 K Vit D once a week after her levels were low, and she tested positive for iron deficiency anemia.   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 She was never toxic on lithium. She has arthritic pain.   She now presents  today with a new slightly different concern, that of sleep apnea.  Her younger sister who resides here in [place] with her has taped her snoring and it is Bridget Jackson, there is irregular breathing she definitely has apnea. She snores in a seated position, she snores when she is in bed, her airway seems to obstruct frequently when she is in supine sleep position.  Sleep habits are as follows: Dinner time is at 6 PM and after dinner she may play bingo in her independent living facility.  She walks 3-4 times 250 meters with the walker. Bedtime for the patient is between 10 and 11 PM, she has a separate bedroom from her living facility. She usually eats a snack before she goes to bed, she has been on trazodone to help her fall asleep, and she is usually asleep before midnight. She sleeps supine on one pillow, the bedroom , which is cool, quiet and dark. No TV. She  Once she is asleep for about 2 hours she usually wakes up with the urge to urinate, but she can go back to sleep and continue.  Nocturia continues to affect her every 2 hours.  The patient usually rises in the morning at 6 AM,  which means that she averages about 6 hours of nocturnal sleep, sometimes 7. She goes to her lift chair where she continues to sleep. She exercises at 10 o clock in a group.  Sleep medical history and family  sleep history: OSA in her father. Schizoaffective disorder .   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 No children and never married. Non smoker. Non drinker,  caffeine -she drinks coffee, ice tea and soda.  She drinks soda or ice tea even later in the afternoon towards evening hours.  Father died when she was 21  Review of Systems: Out of a complete 14 system review, the patient complains of only the following symptoms, and all other reviewed systems are negative.  FSS endorsed at 51 points, Epworth sleepiness score at 15/24 points.    Social History    Socioeconomic History  . Marital status: Single    Spouse name: Not on file  . Number of children: Not on file  . Years of education: Not on file  . Highest education level: Not on file  Occupational History  . Not on file  Social Needs  . Financial resource strain: Not on file  . Food insecurity:    Worry: Not on file    Inability: Not on file  . Transportation needs:    Medical: Not on file    Non-medical: Not on file  Tobacco Use  . Smoking status: Never Smoker  . Smokeless tobacco: Never Used  Substance and Sexual Activity  . Alcohol use: Not Currently  . Drug use: Never  . Sexual activity: Not on file  Lifestyle  . Physical activity:    Days per week: Not on file    Minutes per session: Not on file  . Stress: Not on file  Relationships  . Social connections:    Talks on phone: Not on file    Gets together: Not on file    Attends religious service: Not on file    Active member of club or organization: Not on file    Attends meetings of clubs or organizations: Not on file    Relationship status: Not on file  . Intimate partner violence:    Fear of current or ex partner: Not on file    Emotionally abused: Not on file    Physically abused: Not on file    Forced sexual activity: Not on file  Other Topics Concern  . Not on file  Social History Narrative  . Not on file    Family History  Problem Relation Age of Onset  . Stomach cancer Father   . Cancer Sister   . Diabetes Other     Past Medical History:  Diagnosis Date  . HLD (hyperlipidemia)   . Hypertension   . Pyuria 08/31/2017  . Schizoaffective disorder ([place])   . Stroke [facility])     Past Surgical History:  Procedure Laterality Date  . BREAST SURGERY     FIBROIDS REMOVED    Current Outpatient Medications  Medication Sig Dispense Refill  . acetaminophen (TYLENOL) 500 MG tablet Take 500 mg by mouth 3 (three) times daily.    . carvedilol (COREG) 3.125 MG tablet Take 3.125 mg by mouth 2 (two)  times daily with a meal.    . clopidogrel (PLAVIX) 75 MG tablet Take 75 mg by mouth daily.    . diclofenac sodium (VOLTAREN) 1 % GEL Apply 2 g topically 4 (four) times daily. 100 g 0  . ferrous sulfate 325 (65 FE) MG tablet Take 325 mg by mouth daily with breakfast.    . fluPHENAZine (PROLIXIN) 1 MG tablet Take 4 mg by mouth at bedtime.    Marland Kitchen  lithium 300 MG tablet Take 300 mg by mouth 2 (two) times daily.    Marland Kitchen LORazepam (ATIVAN) 1 MG tablet Take 1 mg by mouth at bedtime.    . Multiple Vitamins-Minerals (MULTIVITAMIN ADULTS) TABS Take 1 tablet by mouth daily.    . traZODone (DESYREL) 50 MG tablet Take 50 mg by mouth at bedtime.    . Vitamin D, Ergocalciferol, (DRISDOL) 50000 units CAPS capsule TK ONE C PO ONCE A WEEK  0   No current facility-administered medications for this visit.     Allergies as of 10/07/2017 - Review Complete 08/30/2017  Allergen Reaction Noted  . Provera [medroxyprogesterone acetate]  08/27/2017  . Prednisone  08/30/2017    Vitals: BP 125/73   Pulse 75   Ht 5\' 3"  (1.6 m)   Wt 184 lb (83.5 kg)   BMI 32.59 kg/m  Last Weight:  Wt Readings from Last 1 Encounters:  10/07/17 184 lb (83.5 kg)   MWU:XLKG mass index is 32.59 kg/m.     Last Height:   Ht Readings from Last 1 Encounters:  10/07/17 5\' 3"  (1.6 m)    Physical exam:  General: The patient is awake, alert and appears not in acute distress. Her facial expression is reduced, masked. She is stooped.   The patient is well groomed. Head: Normocephalic, atraumatic. Neck is supple. Mallampati 3- crowded upper airway  ,  neck circumference:17" . Nasal airflow congested ,  Retrognathia is seen.  Cardiovascular:  Regular rate and rhythm, without  murmurs or carotid bruit, and without distended neck veins. Respiratory: Lungs are clear to auscultation. Skin:  Without evidence of edema, or rash Trunk: BMI is elevated at 32.5. The patient's posture is stooped, bend -   Neurologic exam : The patient is awake and  alert, oriented to place and time.   Cranial nerves:Pupils are equal and briskly reactive to light. Funduscopic exam deferred . Extraocular movements  in vertical and horizontal planes intact and without nystagmus. Visual fields by finger perimetry are intact.Hearing to finger rub intact.  Facial sensation intact to fine touch. Facial motor strength is symmetric , her  tongue and uvula move midline. Shoulder shrug was symmetrical.   Motor exam:   Normal tone, muscle bulk and symmetric strength in all extremities. Sensory:  Fine touch, pinprick and vibration were tested in all extremities. Proprioception tested in the upper extremities was normal.  Coordination: Rapid alternating movements /  Finger-to-nose maneuver  normal without evidence of ataxia, dysmetria or tremo Gait and station: Patient walks with assistive device.Deep tendon reflexes: in the  upper and lower extremities are symmetric and intact.    Assessment: I would like to thank Bridget. Kathyrn Jackson for allowing me to review of such detailed chart, the patient's multiple imaging studies, office visits and laboratory tests were all displayed in detail most recent labs were from 28 August 2017 and documented a iron deficiency,  anemia, vitamin D deficiency, and her most current medication list.  She also checked for CK, CK-MB and CK-MB this as well as for TSH vitamin B12 and lithium levels.  On 31 August 2017 the patient underwent a lumbar spine MRI without contrast that shows severe neuroforaminal narrowing, mild canal stenosis arthropathy, degenerative changes in general throughout the lumbar spine.  Disc bulging is noted mostly mostly broad-based not a disc herniation.   This would be an orthopedic problem.   She also has a decreased renal clearance.   After physical and neurologic examination, review of laboratory studies,  Personal review of imaging studies, reports of other /same  Imaging studies, results of polysomnography and / or  neurophysiology testing and pre-existing records as far as provided in visit., my assessment is   1)   given the recorded pattern of sleep disordered breathing, volume and the classical gasping for air, I have no doubt that the patient has obstructive sleep apnea.  She also has mild red prognathia small lower jaw larger neck circumference, elevated body mass index, and a stooped posture that may affect her chest wall movements. She has many co-morbidities. I would like for the patient to undergo an attended sleep study if possible in the sleep lab, if this is not manageable due to special needs I would have tried to make do with a home sleep test.  I think however that the patient will likely declare herself apneic early within the study and could be split into a therapeutic portion when CPAP could be applied.  Her sister is willing to stay with her.   The patient was advised of the nature of the diagnosed disorder , the treatment options and the  risks for general health and wellness arising from not treating the condition. I do not treat neuromuscular disorders, and CNS disorders are the scope of my practice. 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  I spent more than 60 minutes of face to face time with the patient.  Greater than 50% of time was spent in counseling and coordination of care. We have discussed the diagnosis and differential and I answered the patient's questions.    Plan:  Treatment plan and additional workup :     Bridget Seat, MD 10/04/1192, 1:74 PM  Certified in Neurology by ABPN Certified in [place] by [facility] [address] [place], [place] [address]

## 2017-10-15 DIAGNOSIS — I69351 Hemiplegia and hemiparesis following cerebral infarction affecting right dominant side: Secondary | ICD-10-CM | POA: Diagnosis not present

## 2017-10-15 DIAGNOSIS — N390 Urinary tract infection, site not specified: Secondary | ICD-10-CM | POA: Diagnosis not present

## 2017-10-20 DIAGNOSIS — N390 Urinary tract infection, site not specified: Secondary | ICD-10-CM | POA: Diagnosis not present

## 2017-10-20 DIAGNOSIS — I69351 Hemiplegia and hemiparesis following cerebral infarction affecting right dominant side: Secondary | ICD-10-CM | POA: Diagnosis not present

## 2017-10-28 ENCOUNTER — Ambulatory Visit (INDEPENDENT_AMBULATORY_CARE_PROVIDER_SITE_OTHER): Payer: Medicare Other | Admitting: Neurology

## 2017-10-28 DIAGNOSIS — F251 Schizoaffective disorder, depressive type: Secondary | ICD-10-CM

## 2017-10-28 DIAGNOSIS — G4719 Other hypersomnia: Secondary | ICD-10-CM

## 2017-10-28 DIAGNOSIS — T733XXA Exhaustion due to excessive exertion, initial encounter: Secondary | ICD-10-CM

## 2017-10-28 DIAGNOSIS — I63319 Cerebral infarction due to thrombosis of unspecified middle cerebral artery: Secondary | ICD-10-CM

## 2017-10-28 DIAGNOSIS — G4733 Obstructive sleep apnea (adult) (pediatric): Secondary | ICD-10-CM

## 2017-10-28 DIAGNOSIS — R351 Nocturia: Secondary | ICD-10-CM

## 2017-10-28 DIAGNOSIS — G4730 Sleep apnea, unspecified: Secondary | ICD-10-CM

## 2017-10-29 DIAGNOSIS — N390 Urinary tract infection, site not specified: Secondary | ICD-10-CM | POA: Diagnosis not present

## 2017-10-29 DIAGNOSIS — I69351 Hemiplegia and hemiparesis following cerebral infarction affecting right dominant side: Secondary | ICD-10-CM | POA: Diagnosis not present

## 2017-11-04 DIAGNOSIS — R5383 Other fatigue: Secondary | ICD-10-CM | POA: Diagnosis not present

## 2017-11-04 DIAGNOSIS — N390 Urinary tract infection, site not specified: Secondary | ICD-10-CM | POA: Diagnosis not present

## 2017-11-04 DIAGNOSIS — R350 Frequency of micturition: Secondary | ICD-10-CM | POA: Diagnosis not present

## 2017-11-05 ENCOUNTER — Telehealth: Payer: Self-pay | Admitting: *Deleted

## 2017-11-05 DIAGNOSIS — G4719 Other hypersomnia: Secondary | ICD-10-CM | POA: Insufficient documentation

## 2017-11-05 DIAGNOSIS — G4730 Sleep apnea, unspecified: Secondary | ICD-10-CM | POA: Insufficient documentation

## 2017-11-05 NOTE — Telephone Encounter (Signed)
Called pt's sister Meriah Shands per DPR and LVM (ok per DPR) and informed her that the patient's sleep study showed Severe Obstructive and Mixed Sleep Apnea (OSA) at AHI of 57.1/h. Advised her that patient will need to return to the sleep lab for another sleep study where they will be able to see if patient needs or CPAP or BiPAP to help her sleep apnea and to find the correct pressure settings. Stefan Church that the sleep team will be calling her to setup the appointment for the patient and then afterward the patient will see Dr. Brett Fairy in the office. Encouraged her to call back with any questions. Gave her office number and hours in the message.

## 2017-11-05 NOTE — Telephone Encounter (Signed)
-----   Message from Larey Seat, MD sent at 11/05/2017 10:41 AM EDT ----- POLYSOMNOGRAPHY IMPRESSION :   1. Severe Obstructive and Mixed Sleep Apnea (OSA) at AHI of 57.1/h ( no REM or non Supine sleep ) , which was only  reduced by 50% on CPAP pressures of 9 cm water.  2. Loud Primary Snoring, oral breathing. 3. Poor Sleep efficiency.  RECOMMENDATIONS:  This was an incomplete titration, as an optimal PAP pressure was  not found. I will schedule a return for Mrs. Dom, allowing full  exploration of CPAP and possible use of other modalities, such as  BiPAP.   A follow up appointment will be scheduled after the repeat titration  in the Sleep Clinic at [facility].    Cc Kathyrn Lass, MD

## 2017-11-05 NOTE — Procedures (Signed)
PATIENT'S NAME:  Bridget, Jackson DOB:      [birth year]      MR#:    [id number]     DATE OF RECORDING: 10/28/2017 REFERRING M.D.:  Kathyrn Lass, MD Study Performed:  Split-Night Titration Study HISTORY: This 67 year old patient presents with EDS- she had suffered a cerebellar stroke on 18 April 2016 in [place] -the stroke follow-up care and aftercare included rehab - initially improving but remaining with an increased fall risk ,weakness , soreness, and myalgia and 4 months ago she purchased a seated walker.  6 weeks ago she suffered a fall after which she was locally evaluated, underwent MRI of brain and spine (Dr. Sharman Cheek) and was found not to have another stroke.  About a month ago she was seen by Dr. Sabra Heck and she has noted an almost immediate recovery, less pain, less weakness, less soreness, and more mobility but excessive daytime sleepiness. She tested positive for iron deficiency anemia, low Vit. 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 She was never toxic on lithium. She has EDS.    The patient endorsed the Epworth Sleepiness Scale at 15/24 points, FSS at 51/63 points( high ).   The patient's weight 184 pounds with a height of 63 (inches), resulting in a BMI of 32.4 kg/m2. The patient's neck circumference measured 17 inches.  CURRENT MEDICATIONS: Tylenol, Coreg, Plavix, Voltaren, Prolixin, Ativan, Multivitamin, Desyrel, Drisdol.    PROCEDURE:  This is a multichannel digital polysomnogram utilizing the Somnostar 11.2 system.  Electrodes and sensors were applied and monitored per AASM Specifications.   EEG, EOG, Chin and Limb EMG, were sampled at 200 Hz.  ECG, Snore and Nasal Pressure, Thermal Airflow, Respiratory Effort, CPAP Flow and Pressure, Oximetry was sampled at 50 Hz. Digital video and audio  were recorded.      BASELINE STUDY WITHOUT CPAP RESULTS: Lights Out was at 22:18 and Lights On at 05:03.  Total recording time (TRT) was 200.5, with a total sleep time (TST) of 155.5 minutes.   The patient's sleep latency was 3 minutes.  REM latency was 0 minutes.  The sleep efficiency was 77.6 %.    SLEEP ARCHITECTURE: WASO (Wake after sleep onset) was 27 minutes, Stage N1 was 7.5 minutes, Stage N2 was 135 minutes, Stage N3 was 13 minutes and Stage R (REM sleep) was 0 minutes.  The percentages were Stage N1 4.8%, Stage N2 86.8%, Stage N3 8.4% and Stage R (REM sleep) 0%.   RESPIRATORY ANALYSIS:  There were a total of 148 respiratory events:  75 obstructive apneas, 0 central apneas and 39 mixed apneas with 34 hypopneas. Snoring was noted.    The total APNEA/HYPOPNEA INDEX (AHI) was 57.1 /hour.  0 events occurred in REM sleep and 107 events in NREM with a non-REM AHI of 57.1 /hour. The patient spent all 265 minutes sleep time in the supine position. The supine AHI was 57.1 /hour.  OXYGEN SATURATION & C02:  The wake baseline 02 saturation was 91%, with the lowest being 86%. Time spent below 89% saturation equaled 9 minutes.   AROUSALS/ PERIODIC LIMB MOVEMENTS: The patient had a total of 11 Periodic Limb Movements.  The Periodic Limb Movement (PLM) index was 4.2 /hour and the PLM Arousal index was 0.4 /hour. The arousals were noted as: 1 was spontaneous, 1 was associated with PLMs, and 0 were associated with  respiratory events. Audio and video analysis did not show any abnormal or unusual movements, behaviors, phonations or vocalizations. The patient took two bathroom breaks. Loud Snoring was noted. EKG was in keeping with normal sinus rhythm (NSR).   TITRATION STUDY WITH CPAP RESULTS:  Severe OSA and mixed apnea allowed SPLIT implementation.  CPAP was initiated at 5/0 cmH20 with heated humidity per AASM split night standards and pressure was advanced to 9/0 cmH20 because of hypopneas, apneas and  desaturations. A Simplus FFM in medium was used after the patient was observed sleeping with her mouth wide open. The AHI was not alleviated at the last explored pressure (AHI was 28.6/h), the titration incomplete.    Total recording time (TRT) was 204.5 minutes, with a total sleep time (TST) of 109.5 minutes. The patient's sleep latency was 7 minutes. REM latency was 82 minutes.  The sleep efficiency was only 53.5 %.    SLEEP ARCHITECTURE: Wake after sleep was 88 minutes, Stage N1 13 minutes, Stage N2 71 minutes, Stage N3 14.5 minutes and Stage R (REM sleep) 11 minutes. The percentages were: Stage N1 11.9%, Stage N2 64.8%, Stage N3 13.2% and Stage R (REM sleep) 10.%. The arousals were noted as: 1 was spontaneous, 0 were associated with PLMs, and 0 were associated with respiratory events.  RESPIRATORY ANALYSIS:  There were a total of 77 respiratory events: 7 obstructive apneas, 0 central apneas and 1 mixed apneas with a total of 8 apneas and an apnea index (AI) of 4.4. There were 69 hypopneas with a hypopnea index of 37.8 /hour. The patient also had 0 respiratory event related arousals (RERAs).      The total APNEA/HYPOPNEA INDEX  (AHI) was 42.2 /hour and the total RESPIRATORY DISTURBANCE INDEX was 42.2 /hour.  8 events occurred in REM sleep and 69 events in NREM. The REM AHI was 43.6 /hour versus a non-REM AHI of 42. /hour. REM sleep was achieved on a pressure of 7 cm/H20 (AHI was 36/h .) The patient spent 100% of total sleep time in the supine position. The supine AHI was 42.2 /hour.  OXYGEN SATURATION & C02:  The wake baseline 02 saturation was 95%, with the lowest being 71%. Time spent below 89% saturation equaled 12 minutes.  PERIODIC LIMB MOVEMENTS:   The patient had a total of 0 Periodic Limb Movements. The arousals were noted as: 1 was spontaneous, 1 associated with PLMs, and 0 were associated with respiratory events.  Post-study, the patient indicated that sleep was better than usual, albeit  more fragmented.     POLYSOMNOGRAPHY IMPRESSION :   1. Severe Obstructive and Mixed Sleep Apnea (OSA), which was only reduced by 50% on CPAP pressures of 9 cm water.  2. Loud Primary Snoring, oral breathing. 3. Poor Sleep efficiency.  RECOMMENDATIONS:  This was an incomplete titration, as an optimal PAP pressure was not found. I will schedule a return for Mrs. Abila, allowing full exploration of CPAP and possible use of other modalities, such as BiPAP.   A follow up appointment will be scheduled in the Sleep Clinic at [facility].      I certify that I have reviewed the entire raw data recording prior to the issuance of this report in accordance with the Standards of Accreditation of the American Academy of Sleep Medicine (AASM)    Larey Seat, M.D.  11-05-2017  Diplomat, American Board of Psychiatry and Neurology  Diplomat, [place] of [employer] [facility] Medical Director, [place] [facility] at [facility]

## 2017-11-05 NOTE — Addendum Note (Signed)
Addended by: Larey Seat on: 11/05/2017 10:41 AM   Modules accepted: Orders

## 2017-11-08 NOTE — Telephone Encounter (Signed)
Pt's sister returned my call. She stated that she had received my message and she understood everything. She had a few further questions and these were answered. She asked about the 02 level which I informed her per report it was in the mid to high 90s during the sleep study. She is aware that the sleep team will be calling her to setup the appointment for the cpap titration and the order is in the chart. She asked how to get a report of the sleep study and I advised her that the patient can sign a release here at the office to receive a copy. She verbalized appreciation and had no further questions.

## 2017-11-16 ENCOUNTER — Encounter: Payer: Self-pay | Admitting: Neurology

## 2017-11-17 ENCOUNTER — Encounter: Payer: Self-pay | Admitting: Neurology

## 2017-11-17 ENCOUNTER — Ambulatory Visit (INDEPENDENT_AMBULATORY_CARE_PROVIDER_SITE_OTHER): Payer: Medicare Other | Admitting: Neurology

## 2017-11-17 ENCOUNTER — Encounter

## 2017-11-17 VITALS — BP 129/79 | HR 67 | Ht 63.0 in | Wt 186.0 lb

## 2017-11-17 DIAGNOSIS — G4731 Primary central sleep apnea: Secondary | ICD-10-CM

## 2017-11-17 DIAGNOSIS — I63319 Cerebral infarction due to thrombosis of unspecified middle cerebral artery: Secondary | ICD-10-CM

## 2017-11-17 DIAGNOSIS — Z789 Other specified health status: Secondary | ICD-10-CM

## 2017-11-17 DIAGNOSIS — G4730 Sleep apnea, unspecified: Secondary | ICD-10-CM | POA: Diagnosis not present

## 2017-11-17 NOTE — Patient Instructions (Signed)

## 2017-11-17 NOTE — Progress Notes (Signed)
Provider:  Larey Seat, M D   Primary Care Physician:  Kathyrn Lass, MD   Referring Provider: Kathyrn Lass, MD [facility] Hospitalist.    Chief Complaint  Patient presents with  . New Patient (Initial Visit)    Room 10. Patient here with her daughter.   . Peripheral Neuropathy    Patient reports that she has some weakness in her legs.     HPI: Bridget Jackson is a 67 y.o. female , seen here again on 11-17-2017,after  re- referral by Dr. Kathyrn Lass.   Patient to be evaluated for myopathy/ neuropathy, weakness.  Has improved by d/c of Lipitor. She remains on Lithium.  Has mild tremor. She has undergone a sleep study.  [name] underwent a split-night titration study on 28 October 2017.  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 She had endorsed the Epworth Sleepiness Scale at 15 out of 24 points her AHI was 57.1 which is a significant severe sleep apnea supine AHI was identical as the patient only slept on her back.  She did not have prolonged oxygen desaturations but loud snoring was noted.  Surprisingly her EKG remained in normal sinus rhythm.  She did have some periodic limb movements that were not significant and it came to arousals.  The titration to CPAP was incomplete and for this reason I had asked her to either use an auto CPAP or return for a full night titration.  The study was interpreted on 05 November 2017. She has not yet had her full night titration, it is scheduled for 11-26-2017 .      Seen here on 10-07-2017  in a referral for sleep consultation from Dr. Sabra Heck. In preparation of today's visit I received a big stack of papers relating to Bridget Jackson most recent health history.  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  4 months ago she purchased a seated walker.  6 weeks ago she suffered a fall , was hpspitalized, had undergone MRI brain and spine (Dr Sharman Cheek) and was found not to have another stroke .  About a month ago she was seen by Dr. Sabra Heck, where her cholesterol-lowering medications were discontinued and she has noted an almost immediate recovery less pain, less weakness less soreness and more mobility. She is also on 66 K Vit D once a week after her levels were low, and she tested positive for iron deficiency anemia.   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 She was never toxic on lithium. She has arthritic pain.   She now presents today with a new slightly different concern, that of sleep apnea.  Her younger sister who resides here in [place] with her has taped her snoring and it is Sanders, there is irregular breathing  she definitely has apnea. She snores in a seated position, she snores when she is in bed, her airway seems to obstruct frequently when she is in supine sleep position.  Sleep habits are as follows: Dinner time is at 6 PM and after dinner she may play bingo in her independent living facility.  She walks 3-4 times 250 meters with the walker. Bedtime for the patient is between 10 and 11 PM, she has a separate bedroom from her living facility. She usually eats a snack before she goes to bed, she has been on trazodone to help her fall asleep, and she is  usually asleep before midnight. She sleeps supine on one pillow, the bedroom , which is cool, quiet and dark. No TV. She  Once she is asleep for about 2 hours she usually wakes up with the urge to urinate, but she can go back to sleep and continue.  Nocturia continues to affect her every 2 hours.  The patient usually rises in the morning at 6 AM,  which means that she averages about 6 hours of nocturnal sleep, sometimes 7. She goes to her lift chair where she continues to sleep. She exercises at 10 o clock in a group.  Sleep medical history and family sleep history: OSA in her father. Schizoaffective disorder .   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 No children and never married. Non smoker. Non drinker,  caffeine -she drinks coffee, ice tea and soda.  She drinks soda or ice tea even later in the afternoon towards evening hours.  Father died when she was 66.  Review of Systems: Out of a complete 14 system review, the patient complains of only the following symptoms, and all other reviewed systems are negative.  FSS endorsed at 51 points, Epworth sleepiness score at 15/24 points.    Social History   Socioeconomic History  . Marital status: Single    Spouse name: Not on file  . Number of children: Not on file  . Years of education: Not on file  . Highest education level: Not on file  Occupational History  . Not on file  Social Needs  . Financial resource strain: Not on file  . Food insecurity:    Worry: Not on file    Inability: Not on file  . Transportation needs:    Medical: Not on file    Non-medical: Not on file  Tobacco Use  . Smoking status: Never Smoker  . Smokeless tobacco: Never Used  Substance and Sexual Activity  . Alcohol use: Not Currently  . Drug use: Never  . Sexual activity: Not on file  Lifestyle  . Physical activity:    Days per week: Not on file    Minutes per session: Not on file  . Stress: Not on file   Relationships  . Social connections:    Talks on phone: Not on file    Gets together: Not on file    Attends religious service: Not on file    Active member of club or organization: Not on file    Attends meetings of clubs or organizations: Not on file    Relationship status: Not on file  . Intimate partner violence:    Fear of current or ex partner: Not on file    Emotionally abused: Not on file    Physically abused: Not on file    Forced sexual activity: Not on file  Other Topics Concern  .  Not on file  Social History Narrative  . Not on file    Family History  Problem Relation Age of Onset  . Stomach cancer Father   . Cancer Sister   . Diabetes Other   . Ovarian cancer Maternal Aunt     Past Medical History:  Diagnosis Date  . Glaucoma   . HLD (hyperlipidemia)   . Hypertension   . Pyuria 08/31/2017  . Schizoaffective disorder ([place])   . Stroke [facility])     Past Surgical History:  Procedure Laterality Date  . BREAST SURGERY     FIBROIDS REMOVED    Current Outpatient Medications  Medication Sig Dispense Refill  . acetaminophen (TYLENOL) 500 MG tablet Take 500 mg by mouth 3 (three) times daily.    Marland Kitchen atorvastatin (LIPITOR) 10 MG tablet Take 10 mg by mouth daily.    . carvedilol (COREG) 3.125 MG tablet Take 3.125 mg by mouth 2 (two) times daily with a meal.    . clopidogrel (PLAVIX) 75 MG tablet Take 75 mg by mouth daily.    . diclofenac sodium (VOLTAREN) 1 % GEL Apply 2 g topically 4 (four) times daily. 100 g 0  . ferrous sulfate 325 (65 FE) MG tablet Take 325 mg by mouth daily with breakfast.    . fluPHENAZine (PROLIXIN) 1 MG tablet Take 4 mg by mouth at bedtime.    Marland Kitchen lithium 300 MG tablet Take 300 mg by mouth 2 (two) times daily.    Marland Kitchen LORazepam (ATIVAN) 1 MG tablet Take 1 mg by mouth at bedtime.    . Multiple Vitamins-Minerals (MULTIVITAMIN ADULTS) TABS Take 1 tablet by mouth daily.    . silver sulfADIAZINE (SILVADENE) 1 % cream Apply 1 application topically  daily.    . traZODone (DESYREL) 50 MG tablet Take 50 mg by mouth at bedtime.    . Vitamin D, Ergocalciferol, (DRISDOL) 50000 units CAPS capsule TK ONE C PO ONCE A WEEK  0   No current facility-administered medications for this visit.     Allergies as of 11/17/2017 - Review Complete 11/17/2017  Allergen Reaction Noted  . Provera [medroxyprogesterone acetate]  08/27/2017  . Prednisone  08/30/2017    Vitals: BP 129/79   Pulse 67   Ht 5\' 3"  (1.6 m)   Wt 186 lb (84.4 kg)   BMI 32.95 kg/m  Last Weight:  Wt Readings from Last 1 Encounters:  11/17/17 186 lb (84.4 kg)   OYD:XAJO mass index is 32.95 kg/m.     Last Height:   Ht Readings from Last 1 Encounters:  11/17/17 5\' 3"  (1.6 m)    Physical exam:  General: The patient is awake, alert and appears not in acute distress. Her facial expression is reduced, masked. She is stooped.   The patient is well groomed. Head: Normocephalic, atraumatic. Neck is supple. Mallampati 3- crowded upper airway  ,  neck circumference:17" . Nasal airflow congested ,  Retrognathia is seen.  Cardiovascular:  Regular rate and rhythm, without  murmurs or carotid bruit, and without distended neck veins. Respiratory: Lungs are clear to auscultation. Skin:  Without evidence of edema, or rash Trunk: BMI is elevated at 32.5. The patient's posture is stooped, bend -   Neurologic exam : The patient is awake and alert, oriented to place and time.   Cranial nerves:Pupils are equal and briskly reactive to light. Funduscopic exam deferred . Extraocular movements  in vertical and horizontal planes intact and without nystagmus. Visual fields by finger perimetry are intact.Hearing  to finger rub intact.  Facial sensation intact to fine touch. Facial motor strength is symmetric , her  tongue and uvula move midline. Shoulder shrug was symmetrical.   Motor exam:   Normal tone, muscle bulk and symmetric strength in all extremities. Sensory:  Fine touch, pinprick and  vibration were tested in all extremities. Proprioception tested in the upper extremities was normal.  Coordination: Rapid alternating movements /  Finger-to-nose maneuver  normal without evidence of ataxia, dysmetria or tremo Gait and station: Patient walks with assistive device.Deep tendon reflexes: in the  upper and lower extremities are symmetric and intact.    Assessment: I would like to thank Dr. Kathyrn Lass for allowing me to review of such detailed chart, the patient's multiple imaging studies, office visits and laboratory tests were all displayed in detail most recent labs were from 28 August 2017 and documented a iron deficiency,  anemia, vitamin D deficiency, and her most current medication list.  She also checked for CK, CK-MB and CK-MB this as well as for TSH vitamin B12 and lithium levels.  On 31 August 2017 the patient underwent a lumbar spine MRI without contrast that shows severe neuroforaminal narrowing, mild canal stenosis arthropathy, degenerative changes in general throughout the lumbar spine.  Disc bulging is noted mostly mostly broad-based not a disc herniation.   This would be an orthopedic problem.  She also has a decreased renal clearance.   After physical and neurologic examination, review of laboratory studies,  Personal review of imaging studies, reports of other /same  Imaging studies, results of polysomnography and / or neurophysiology testing and pre-existing records as far as provided in visit., my assessment is   1)   given the recorded pattern of sleep disordered breathing, volume and the classical gasping for air, I have no doubt that the patient has obstructive sleep apnea.  She also has mild red prognathia small lower jaw larger neck circumference, elevated body mass index, and a stooped posture that may affect her chest wall movements. She has many co-morbidities. She had indeed severe OSA and will return for full night BiPAP  titration, as she failed CPAP.  I spent  more than 20 minutes of face to face time with the patient. Greater than 50% of time was spent in counseling and coordination of care. We have discussed the diagnosis and differential and I answered the patient's questions.   PS = The patient was advised of the nature of the diagnosed disorder , the treatment options and the  risks for general health and wellness arising from not treating the condition. I do not treat neuromuscular disorders, and CNS disorders are the scope of my practice. 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    Larey Seat, MD 56/81/2751, 7:00 PM  Certified in Neurology by ABPN Certified in [place] by [facility] [address], [place], [place] [address]

## 2017-11-24 ENCOUNTER — Encounter

## 2017-11-24 ENCOUNTER — Institutional Professional Consult (permissible substitution): Payer: BLUE CROSS/BLUE SHIELD | Admitting: Neurology

## 2017-11-24 DIAGNOSIS — R531 Weakness: Secondary | ICD-10-CM | POA: Diagnosis not present

## 2017-11-24 DIAGNOSIS — I69351 Hemiplegia and hemiparesis following cerebral infarction affecting right dominant side: Secondary | ICD-10-CM | POA: Diagnosis not present

## 2017-11-26 ENCOUNTER — Ambulatory Visit (INDEPENDENT_AMBULATORY_CARE_PROVIDER_SITE_OTHER): Payer: Medicare Other | Admitting: Neurology

## 2017-11-26 DIAGNOSIS — G4733 Obstructive sleep apnea (adult) (pediatric): Secondary | ICD-10-CM

## 2017-11-26 DIAGNOSIS — I6350 Cerebral infarction due to unspecified occlusion or stenosis of unspecified cerebral artery: Secondary | ICD-10-CM

## 2017-11-26 DIAGNOSIS — G4730 Sleep apnea, unspecified: Secondary | ICD-10-CM

## 2017-11-26 DIAGNOSIS — G4719 Other hypersomnia: Secondary | ICD-10-CM

## 2017-11-30 NOTE — Procedures (Signed)
PATIENT'S NAME:  Bridget Jackson, Bridget Jackson DOB:      09-Jan-1951      MR#:    [id number]     DATE OF RECORDING: 11/26/2017 REFERRING M.D.:  Kathyrn Lass, MD Study Performed:   Titration to Positive Airway Pressure HISTORY:  Mrs. Denard returned for a full night titration study following her Split Night PSG study from 10-28-2017 which documented severe OSA and mixed apnea at AHI of 57.1/h,   A Simplus FFM in medium was used after the patient was observed sleeping with her mouth wide open. The AHI was not alleviated at the last explored pressure of CPAP at 9 cm water (AHI was 28.6/h), the titration was incomplete and is now to be continued.   HLD, Hypertension, myalgia, multiple strokes, falls, Schizoaffective disorder, Nocturia, Cerebrovascular accident (CVA), breast cancer. The patient endorsed the Epworth Sleepiness Scale at 15/24 points and the Fatigue Score at 51/63 points.   The patient's weight 184 pounds with a height of 63 (inches), resulting in a BMI of 32.4 kg/m2. The patient's neck circumference measured 17 inches.  CURRENT MEDICATIONS: Tylenol, Coreg, Plavix, Voltaren, Prolixin, Ativan, Multivitamin, Desyrel, Drisdol.   PROCEDURE:  This is a multichannel digital polysomnogram utilizing the SomnoStar 11.2 system.  Electrodes and sensors were applied and monitored per AASM Specifications.   EEG, EOG, Chin and Limb EMG, were sampled at 200 Hz.  ECG, Snore and Nasal Pressure, Thermal Airflow, Respiratory Effort, CPAP Flow and Pressure, Oximetry was sampled at 50 Hz. Digital video and audio were recorded.      CPAP was initiated at 5 cmH20 with heated humidity per AASM split night standards and pressure was advanced to 16 cmH20 because of hypopneas, apneas and desaturations. At none of the CPAP pressures was there a desirable reduction in AHI noted, for example: at a CPAP pressure of 16 cmH20, there was a reduction of the AHI to 34.3/h without improvement of sleep apnea. The patient was changed to BiPAP  and at a final pressure of 24/19 cm water presented with an AHI of 2.0/h and a sleep time over 90 minutes!  Lights Out was at 22:26 and Lights On at 05:01. Total recording time (TRT) was 395 minutes, with a total sleep time (TST) of 371 minutes. The patient's sleep latency was 10 minutes. REM latency was 237 minutes.  The sleep efficiency was 93.9 %.    SLEEP ARCHITECTURE: WASO (Wake after sleep onset) was 20.5 minutes.  There were 17 minutes in Stage N1, 188 minutes Stage N2, 88.5 minutes Stage N3 and 77.5 minutes in Stage REM.  The percentage of Stage N1 was 4.6%, Stage N2 was 50.7%, Stage N3 was 23.9% and Stage R (REM sleep) was 20.9%.   RESPIRATORY ANALYSIS:  There was a total of 133 respiratory events: 0 obstructive apneas, 0 central apneas and 1 mixed apnea with a total of 1 apnea and 132 hypopneas. The total APNEA/HYPOPNEA INDEX (AHI) was 21.5 /hour - 12 events occurred in REM sleep and 121 events in NREM. The REM AHI was 9.3 /hour versus a non-REM AHI of 24.7 /hour.  The patient spent 371 minutes of total sleep time in the supine position and 0 minutes in non-supine. The supine AHI was 21.5/h, versus a non-supine AHI of 0.0.  OXYGEN SATURATION & C02:  The baseline 02 saturation was 94%, with the lowest being 72%. Time spent below 89% saturation equaled 7 minutes.  PERIODIC LIMB MOVEMENTS:  The patient had a total of 0 Periodic Limb Movements.  Audio  and video analysis did not show any abnormal or unusual movements, behaviors, phonations or vocalizations.  The patient took only one bathroom break. Snoring was noted until alleviated under the last explored pressure on BiPAP. EKG was in keeping with normal sinus rhythm.  Post-study, the patient indicated that sleep was the same as usual.    DIAGNOSIS 1. Severe, mostly Obstructive Sleep Apnea responded only to BiPAP at very high pressures. 24/19 cm water was needed to reduce the AHI, consistent of hypopneas.  2. Severe Upper Airway Resistance  Syndrome.    PLANS/RECOMMENDATIONS:  Start the BiPAP therapy at 24/19 cm water - oxygen was not needed. The patient was fitted with an Airfit F 30 medium sized FF Mask. BiPAP therapy compliance is defined as 4 hours of nightly use.    DISCUSSION: DME to provide download of BiPAP therapy within 30 days of therapy.   A follow up appointment will be scheduled in the Sleep Clinic at [facility].   Please call [phone] with any questions.      I certify that I have reviewed the entire raw data recording prior to the issuance of this report in accordance with the Standards of Accreditation of the American Academy of Sleep Medicine (AASM)    Larey Seat, M.D.  11-30-2017  Diplomat, American Board of Psychiatry and Neurology  Diplomat, American Board of Sleep Medicine Medical Director, [place] [facility] at [facility]

## 2017-11-30 NOTE — Addendum Note (Signed)
Addended by: Larey Seat on: 11/30/2017 05:13 PM   Modules accepted: Orders

## 2017-12-01 ENCOUNTER — Encounter: Payer: Self-pay | Admitting: *Deleted

## 2017-12-01 ENCOUNTER — Telehealth: Payer: Self-pay | Admitting: *Deleted

## 2017-12-01 NOTE — Telephone Encounter (Signed)
-----   Message from Larey Seat, MD sent at 11/30/2017  5:13 PM EDT ----- DIAGNOSIS 1. Severe, mostly Obstructive Sleep Apnea responded only to BiPAP  at very high pressures. 24/19 cm water was needed to reduce the  AHI, consistent of hypopneas.  2. Severe Upper Airway Resistance Syndrome.    PLANS/RECOMMENDATIONS:  Start the BiPAP therapy at 24/19 cm water - oxygen was not  needed. The patient was fitted with an Airfit F 30 medium sized  FF Mask. BiPAP therapy compliance is defined as 4 hours of  nightly use.   DISCUSSION: DME to provide download of BiPAP therapy within 30  days of therapy. Patient may need additional help and desensitization to high pressure BiPAP. Please note that she is not independent (living with sister) , has schizoaffective disorder.     Cc Kathyrn Lass, Stroke MD.

## 2017-12-01 NOTE — Telephone Encounter (Addendum)
Spoke to sister, [name] of pt and relayed that Dr. Brett Fairy recommended Bipap for pt.  Will like to use [facility].  Will send letter to them relating information. She will call back when she has schedule in front of her to make 60-90 day initial bipap f/u.  Letter sent.

## 2017-12-02 NOTE — Telephone Encounter (Signed)
Community message sent to [place] for new bipap order.

## 2017-12-07 DIAGNOSIS — R531 Weakness: Secondary | ICD-10-CM | POA: Diagnosis not present

## 2017-12-07 DIAGNOSIS — I69351 Hemiplegia and hemiparesis following cerebral infarction affecting right dominant side: Secondary | ICD-10-CM | POA: Diagnosis not present

## 2017-12-10 DIAGNOSIS — R531 Weakness: Secondary | ICD-10-CM | POA: Diagnosis not present

## 2017-12-10 DIAGNOSIS — I69351 Hemiplegia and hemiparesis following cerebral infarction affecting right dominant side: Secondary | ICD-10-CM | POA: Diagnosis not present

## 2017-12-14 DIAGNOSIS — R531 Weakness: Secondary | ICD-10-CM | POA: Diagnosis not present

## 2017-12-14 DIAGNOSIS — I69351 Hemiplegia and hemiparesis following cerebral infarction affecting right dominant side: Secondary | ICD-10-CM | POA: Diagnosis not present

## 2017-12-16 DIAGNOSIS — R531 Weakness: Secondary | ICD-10-CM | POA: Diagnosis not present

## 2017-12-16 DIAGNOSIS — I69351 Hemiplegia and hemiparesis following cerebral infarction affecting right dominant side: Secondary | ICD-10-CM | POA: Diagnosis not present

## 2017-12-19 ENCOUNTER — Encounter: Payer: Self-pay | Admitting: Emergency Medicine

## 2017-12-20 DIAGNOSIS — R531 Weakness: Secondary | ICD-10-CM | POA: Diagnosis not present

## 2017-12-20 DIAGNOSIS — I69351 Hemiplegia and hemiparesis following cerebral infarction affecting right dominant side: Secondary | ICD-10-CM | POA: Diagnosis not present

## 2017-12-23 DIAGNOSIS — R531 Weakness: Secondary | ICD-10-CM | POA: Diagnosis not present

## 2017-12-23 DIAGNOSIS — I69351 Hemiplegia and hemiparesis following cerebral infarction affecting right dominant side: Secondary | ICD-10-CM | POA: Diagnosis not present

## 2017-12-27 DIAGNOSIS — I69351 Hemiplegia and hemiparesis following cerebral infarction affecting right dominant side: Secondary | ICD-10-CM | POA: Diagnosis not present

## 2017-12-27 DIAGNOSIS — R531 Weakness: Secondary | ICD-10-CM | POA: Diagnosis not present

## 2017-12-29 DIAGNOSIS — R531 Weakness: Secondary | ICD-10-CM | POA: Diagnosis not present

## 2017-12-29 DIAGNOSIS — I69351 Hemiplegia and hemiparesis following cerebral infarction affecting right dominant side: Secondary | ICD-10-CM | POA: Diagnosis not present

## 2018-01-05 ENCOUNTER — Emergency Department (HOSPITAL_COMMUNITY): Payer: No Typology Code available for payment source

## 2018-01-05 ENCOUNTER — Ambulatory Visit (INDEPENDENT_AMBULATORY_CARE_PROVIDER_SITE_OTHER): Payer: Medicare Other | Admitting: Psychiatry

## 2018-01-05 ENCOUNTER — Encounter (HOSPITAL_COMMUNITY): Payer: Self-pay | Admitting: Emergency Medicine

## 2018-01-05 ENCOUNTER — Other Ambulatory Visit: Payer: Self-pay

## 2018-01-05 ENCOUNTER — Emergency Department (HOSPITAL_COMMUNITY)
Admission: EM | Admit: 2018-01-05 | Discharge: 2018-01-05 | Disposition: A | Discharge status: Home / Self Care | Payer: No Typology Code available for payment source | Attending: Emergency Medicine | Admitting: Emergency Medicine

## 2018-01-05 ENCOUNTER — Encounter: Payer: Self-pay | Admitting: Psychiatry

## 2018-01-05 VITALS — BP 88/62 | HR 65

## 2018-01-05 DIAGNOSIS — S0911XA Strain of muscle and tendon of head, initial encounter: Secondary | ICD-10-CM | POA: Diagnosis not present

## 2018-01-05 DIAGNOSIS — I10 Essential (primary) hypertension: Secondary | ICD-10-CM | POA: Insufficient documentation

## 2018-01-05 DIAGNOSIS — Y999 Unspecified external cause status: Secondary | ICD-10-CM | POA: Diagnosis not present

## 2018-01-05 DIAGNOSIS — S299XXA Unspecified injury of thorax, initial encounter: Secondary | ICD-10-CM | POA: Diagnosis not present

## 2018-01-05 DIAGNOSIS — R0789 Other chest pain: Secondary | ICD-10-CM | POA: Diagnosis not present

## 2018-01-05 DIAGNOSIS — F259 Schizoaffective disorder, unspecified: Secondary | ICD-10-CM | POA: Diagnosis not present

## 2018-01-05 DIAGNOSIS — Y92410 Unspecified street and highway as the place of occurrence of the external cause: Secondary | ICD-10-CM | POA: Insufficient documentation

## 2018-01-05 DIAGNOSIS — Z7902 Long term (current) use of antithrombotics/antiplatelets: Secondary | ICD-10-CM | POA: Diagnosis not present

## 2018-01-05 DIAGNOSIS — Z79899 Other long term (current) drug therapy: Secondary | ICD-10-CM | POA: Diagnosis not present

## 2018-01-05 DIAGNOSIS — S0083XA Contusion of other part of head, initial encounter: Secondary | ICD-10-CM | POA: Diagnosis not present

## 2018-01-05 DIAGNOSIS — M25561 Pain in right knee: Secondary | ICD-10-CM | POA: Diagnosis not present

## 2018-01-05 DIAGNOSIS — S8991XA Unspecified injury of right lower leg, initial encounter: Secondary | ICD-10-CM | POA: Diagnosis not present

## 2018-01-05 DIAGNOSIS — Y9389 Activity, other specified: Secondary | ICD-10-CM | POA: Diagnosis not present

## 2018-01-05 DIAGNOSIS — Z8673 Personal history of transient ischemic attack (TIA), and cerebral infarction without residual deficits: Secondary | ICD-10-CM | POA: Insufficient documentation

## 2018-01-05 DIAGNOSIS — R079 Chest pain, unspecified: Secondary | ICD-10-CM | POA: Diagnosis not present

## 2018-01-05 DIAGNOSIS — F5101 Primary insomnia: Secondary | ICD-10-CM | POA: Diagnosis not present

## 2018-01-05 DIAGNOSIS — S0990XA Unspecified injury of head, initial encounter: Secondary | ICD-10-CM | POA: Diagnosis not present

## 2018-01-05 DIAGNOSIS — M79604 Pain in right leg: Secondary | ICD-10-CM | POA: Diagnosis not present

## 2018-01-05 DIAGNOSIS — T148XXA Other injury of unspecified body region, initial encounter: Secondary | ICD-10-CM

## 2018-01-05 MED ORDER — TRAZODONE HCL 50 MG PO TABS: 50.0000 mg | ORAL_TABLET | Freq: Every day | ORAL | 1 refills | Status: DC

## 2018-01-05 MED ORDER — LITHIUM CARBONATE 300 MG PO TABS: 300.0000 mg | ORAL_TABLET | Freq: Two times a day (BID) | ORAL | 1 refills | Status: DC

## 2018-01-05 MED ORDER — FLUPHENAZINE HCL 1 MG PO TABS: 4.0000 mg | ORAL_TABLET | Freq: Every day | ORAL | 1 refills | Status: DC

## 2018-01-05 MED ORDER — LORAZEPAM 1 MG PO TABS: 1.0000 mg | ORAL_TABLET | Freq: Every day | ORAL | 1 refills | Status: DC

## 2018-01-05 MED ORDER — ACETAMINOPHEN 500 MG PO TABS
1000.0000 mg | ORAL_TABLET | Freq: Once | ORAL | Status: AC
  Administered 2018-01-05: 1000 mg via ORAL
  Filled 2018-01-05: qty 2
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
## 2018-01-05 NOTE — Progress Notes (Signed)
Bridget Jackson [id number] December 21, 1950 67 y.o.  Subjective:   Patient ID:  Bridget Jackson is a 67 y.o. (DOB Jul 20, 1950) female.  Chief Complaint:  Chief Complaint  Patient presents with  . Follow-up    h/o Schizoaffective D/O    HPI Bridget Jackson presents to the office today for follow-up of h/o mood instability and insomnia. She is accompanied by her sister.   Sister reports that pt had exhibited gait disturbance- "like a Parkinsonian walk." Pt then fell in her apartment and was evaluated in the [place] and released. Saw PCP the following day and she was instructed to go to [facility]. She had medical w/u there. PCP then referred her to a neurologist. Sister reports that neurologist ruled out TD. PCP then discontinued statin to determine if this was causing difficulties and that difficulties gradually improved. She then had some increased assistance (PT, OT, sitters, in home care) in her independent living facility. Sister reports that she continues to improve. Sister reports that pt had a sleep study that showed Mixed sleep apnea and now has a cPap and just received a BiPap.  She reports that her mood has been stable. Denies depressed mood. She and her sister deny any recent s/s of mania or irritability. Denies anxiety. She reports that she awakens every 2 hours to urinate. She reports that her appetite has been good. Reports energy and motivation have been adequate. She reports that her concentration has been better and notices her ability to do word searches have improved and she is playing [place]. Denies SI.   Denies paranoia, AH, or VH.   Sister reports that pt seems to be sleepier recently.    Review of Systems:  Review of Systems  Gastrointestinal: Positive for constipation.  Musculoskeletal: Positive for gait problem.  Neurological: Positive for speech difficulty and weakness.       She reports chronic tremor. They deny any worsening tremor.   Psychiatric/Behavioral:       Please refer to HPI    Medications: I have reviewed the patient's current medications.  Current Outpatient Medications  Medication Sig Dispense Refill  . acetaminophen (TYLENOL) 500 MG tablet Take 500 mg by mouth every 8 (eight) hours as needed.     . carvedilol (COREG) 3.125 MG tablet Take 3.125 mg by mouth 2 (two) times daily with a meal.    . clopidogrel (PLAVIX) 75 MG tablet Take 75 mg by mouth daily.    . diclofenac sodium (VOLTAREN) 1 % GEL Apply 2 g topically 4 (four) times daily. (Patient not taking: Reported on 01/05/2018) 100 g 0  . ferrous sulfate 325 (65 FE) MG tablet Take 325 mg by mouth daily with breakfast.    . fluPHENAZine (PROLIXIN) 1 MG tablet Take 4 tablets (4 mg total) by mouth at bedtime. 360 tablet 1  . lithium 300 MG tablet Take 1 tablet (300 mg total) by mouth 2 (two) times daily. 180 tablet 1  . LORazepam (ATIVAN) 1 MG tablet Take 1 tablet (1 mg total) by mouth at bedtime. 90 tablet 1  . Multiple Vitamins-Minerals (MULTIVITAMIN ADULTS) TABS Take 1 tablet by mouth daily.    . silver sulfADIAZINE (SILVADENE) 1 % cream Apply 1 application topically daily.    . traZODone (DESYREL) 50 MG tablet Take 1 tablet (50 mg total) by mouth at bedtime. 90 tablet 1  . Vitamin D, Ergocalciferol, (DRISDOL) 1.25 MG (50000 UT) CAPS capsule Take 1 capsule by mouth once a week.  No current facility-administered medications for this visit.     Medication Side Effects: Other: Occasional drowsiness and slurred speech  Allergies:  Allergies  Allergen Reactions  . Lipitor [Atorvastatin Calcium]   . Provera [Medroxyprogesterone Acetate]   . Prednisone     Other reaction(s): Other    Past Medical History:  Diagnosis Date  . Central sleep apnea   . Glaucoma   . HLD (hyperlipidemia)   . Hypertension   . OSA (obstructive sleep apnea)   . Pyuria 08/31/2017  . Schizoaffective disorder ([place])   . Stroke [facility])     Family History  Problem Relation  Age of Onset  . Stomach cancer Father   . Cancer Sister   . Schizophrenia Sister   . Diabetes Other   . Ovarian cancer Maternal Aunt   . Depression Maternal Aunt   . Paranoid behavior Brother   . Depression Sister   . Depression Sister     Social History   Socioeconomic History  . Marital status: Single    Spouse name: Not on file  . Number of children: Not on file  . Years of education: Not on file  . Highest education level: Not on file  Occupational History  . Not on file  Social Needs  . Financial resource strain: Not on file  . Food insecurity:    Worry: Not on file    Inability: Not on file  . Transportation needs:    Medical: Not on file    Non-medical: Not on file  Tobacco Use  . Smoking status: Never Smoker  . Smokeless tobacco: Never Used  Substance and Sexual Activity  . Alcohol use: Not Currently  . Drug use: Never  . Sexual activity: Not on file  Lifestyle  . Physical activity:    Days per week: Not on file    Minutes per session: Not on file  . Stress: Not on file  Relationships  . Social connections:    Talks on phone: Not on file    Gets together: Not on file    Attends religious service: Not on file    Active member of club or organization: Not on file    Attends meetings of clubs or organizations: Not on file    Relationship status: Not on file  . Intimate partner violence:    Fear of current or ex partner: Not on file    Emotionally abused: Not on file    Physically abused: Not on file    Forced sexual activity: Not on file  Other Topics Concern  . Not on file  Social History Narrative  . Not on file    Past Medical History, Surgical history, Social history, and Family history were reviewed and updated as appropriate.   Please see review of systems for further details on the patient's review from today.   Objective:   Physical Exam:  BP (!) 88/62   Pulse 65   Physical Exam  Constitutional: She is oriented to person, place, and  time. She appears well-developed. No distress.  Musculoskeletal: She exhibits no deformity.  Neurological: She is oriented to person, place, and time. Coordination normal.  Psychiatric: Her behavior is normal. Judgment and thought content normal. Her mood appears not anxious. Her affect is not angry, not blunt, not labile and not inappropriate. Cognition and memory are normal. She does not exhibit a depressed mood. She expresses no homicidal and no suicidal ideation. She expresses no suicidal plans and no homicidal plans.  Insight intact. No auditory or visual hallucinations. No delusions.  Speech is somewhat slurred at times. Intermittently drowsy    Lab Review:     Component Value Date/Time   NA 142 08/31/2017 0432   K 3.9 08/31/2017 0432   CL 113 (H) 08/31/2017 0432   CO2 22 08/31/2017 0432   GLUCOSE 121 (H) 08/31/2017 0432   BUN 14 08/31/2017 0432   CREATININE 1.15 (H) 08/31/2017 0432   CALCIUM 8.9 08/31/2017 0432   PROT 5.8 (L) 08/31/2017 0432   ALBUMIN 3.4 (L) 08/31/2017 0432   AST 16 08/31/2017 0432   ALT 16 08/31/2017 0432   ALKPHOS 55 08/31/2017 0432   BILITOT 0.4 08/31/2017 0432   GFRNONAA 48 (L) 08/31/2017 0432   GFRAA 56 (L) 08/31/2017 0432       Component Value Date/Time   WBC 7.2 08/31/2017 0432   RBC 3.55 (L) 08/31/2017 0432   HGB 11.0 (L) 08/31/2017 0432   HCT 35.6 (L) 08/31/2017 0432   PLT 245 08/31/2017 0432   MCV 100.3 (H) 08/31/2017 0432   MCH 31.0 08/31/2017 0432   MCHC 30.9 08/31/2017 0432   RDW 12.9 08/31/2017 0432    Lithium Lvl  Date Value Ref Range Status  08/31/2017 0.50 (L) 0.60 - 1.20 mmol/L Final    Comment:    Performed at [facility], [place] [address], [place], [place] [address]     No results found for: PHENYTOIN, PHENOBARB, VALPROATE, CBMZ   .res Assessment: Plan:   Patient seen for 30 minutes and greater than 50% of visit spent counseling patient to include encouraging her to work on tolerating BiPAP mask since improvement  in sleep apnea would also help benefit her sleep, energy, and thinking/concentration.  Discussed considering gradual decreased titration of lorazepam in the future since lorazepam could contribute to slowed thinking and drowsiness.  Discussed not decreasing lorazepam at this time since she has some anxiety around wearing BiPAP mask and lorazepam may be helpful for this.  Discussed that lorazepam would need to be gradually decreased despite being on a relatively low dose considering the length of time that patient has taken lorazepam and would most likely decrease only by 0.25 mg every month.  Will request most recent labs from PCP. Continue Prolixin 4 mg at bedtime for mood and prevention of psychotic signs and symptoms. Continue lithium 300 mg twice daily for mood stabilization. Continue Ativan 1 mg at bedtime for anxiety and insomnia. Continue trazodone 50 mg at bedtime for insomnia. Schizoaffective disorder, unspecified type ([place]) - Plan: fluPHENAZine (PROLIXIN) 1 MG tablet, lithium 300 MG tablet  Primary insomnia - Plan: traZODone (DESYREL) 50 MG tablet, LORazepam (ATIVAN) 1 MG tablet  Please see After Visit Summary for patient specific instructions.  Future Appointments  Date Time Provider [place]  07/06/2018  1:00 PM Thayer Headings, PMHNP CP-CP None    No orders of the defined types were placed in this encounter.     -------------------------------
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
## 2018-01-05 NOTE — Discharge Instructions (Addendum)
You can take Tylenol or Ibuprofen as directed for pain. You can alternate Tylenol and Ibuprofen every 4 hours. If you take Tylenol at 1pm, then you can take Ibuprofen at 5pm. Then you can take Tylenol again at 9pm.   Follow up with your primary care doctor in the next 3-4 days.   Return to the Emergency Department for any worsening pain, chest pain, difficulty breathing, vomiting, numbness/weakness of your arms or legs, difficulty walking or any other worsening or concerning symptoms.

## 2018-01-06 DIAGNOSIS — I69351 Hemiplegia and hemiparesis following cerebral infarction affecting right dominant side: Secondary | ICD-10-CM | POA: Diagnosis not present

## 2018-01-06 DIAGNOSIS — R531 Weakness: Secondary | ICD-10-CM | POA: Diagnosis not present

## 2018-01-10 DIAGNOSIS — I69351 Hemiplegia and hemiparesis following cerebral infarction affecting right dominant side: Secondary | ICD-10-CM | POA: Diagnosis not present

## 2018-01-10 DIAGNOSIS — R531 Weakness: Secondary | ICD-10-CM | POA: Diagnosis not present

## 2018-01-12 DIAGNOSIS — E559 Vitamin D deficiency, unspecified: Secondary | ICD-10-CM | POA: Diagnosis not present

## 2018-01-12 DIAGNOSIS — Z8673 Personal history of transient ischemic attack (TIA), and cerebral infarction without residual deficits: Secondary | ICD-10-CM | POA: Diagnosis not present

## 2018-01-12 DIAGNOSIS — N183 Chronic kidney disease, stage 3 (moderate): Secondary | ICD-10-CM | POA: Diagnosis not present

## 2018-01-12 DIAGNOSIS — I129 Hypertensive chronic kidney disease with stage 1 through stage 4 chronic kidney disease, or unspecified chronic kidney disease: Secondary | ICD-10-CM | POA: Diagnosis not present

## 2018-01-12 DIAGNOSIS — D649 Anemia, unspecified: Secondary | ICD-10-CM | POA: Diagnosis not present

## 2018-01-12 DIAGNOSIS — Z6836 Body mass index (BMI) 36.0-36.9, adult: Secondary | ICD-10-CM | POA: Diagnosis not present

## 2018-01-13 ENCOUNTER — Emergency Department (HOSPITAL_COMMUNITY): Payer: Medicare Other

## 2018-01-13 ENCOUNTER — Encounter (HOSPITAL_COMMUNITY): Payer: Self-pay | Admitting: General Practice

## 2018-01-13 ENCOUNTER — Other Ambulatory Visit: Payer: Self-pay

## 2018-01-13 ENCOUNTER — Inpatient Hospital Stay (HOSPITAL_COMMUNITY)
Admission: EM | Admit: 2018-01-13 | Discharge: 2018-01-24 | DRG: 871 | Disposition: A | Discharge status: Skilled Nursing Facility | Payer: Medicare Other | Attending: Internal Medicine | Admitting: Internal Medicine

## 2018-01-13 DIAGNOSIS — E785 Hyperlipidemia, unspecified: Secondary | ICD-10-CM | POA: Diagnosis present

## 2018-01-13 DIAGNOSIS — J9691 Respiratory failure, unspecified with hypoxia: Secondary | ICD-10-CM | POA: Diagnosis not present

## 2018-01-13 DIAGNOSIS — I10 Essential (primary) hypertension: Secondary | ICD-10-CM | POA: Diagnosis not present

## 2018-01-13 DIAGNOSIS — R55 Syncope and collapse: Secondary | ICD-10-CM

## 2018-01-13 DIAGNOSIS — M170 Bilateral primary osteoarthritis of knee: Secondary | ICD-10-CM | POA: Diagnosis present

## 2018-01-13 DIAGNOSIS — Z818 Family history of other mental and behavioral disorders: Secondary | ICD-10-CM

## 2018-01-13 DIAGNOSIS — R410 Disorientation, unspecified: Secondary | ICD-10-CM | POA: Diagnosis not present

## 2018-01-13 DIAGNOSIS — S3991XA Unspecified injury of abdomen, initial encounter: Secondary | ICD-10-CM | POA: Diagnosis not present

## 2018-01-13 DIAGNOSIS — Z8673 Personal history of transient ischemic attack (TIA), and cerebral infarction without residual deficits: Secondary | ICD-10-CM

## 2018-01-13 DIAGNOSIS — R4182 Altered mental status, unspecified: Secondary | ICD-10-CM

## 2018-01-13 DIAGNOSIS — D631 Anemia in chronic kidney disease: Secondary | ICD-10-CM | POA: Diagnosis not present

## 2018-01-13 DIAGNOSIS — E876 Hypokalemia: Secondary | ICD-10-CM | POA: Diagnosis present

## 2018-01-13 DIAGNOSIS — Z7401 Bed confinement status: Secondary | ICD-10-CM | POA: Diagnosis not present

## 2018-01-13 DIAGNOSIS — N3000 Acute cystitis without hematuria: Secondary | ICD-10-CM

## 2018-01-13 DIAGNOSIS — N183 Chronic kidney disease, stage 3 (moderate): Secondary | ICD-10-CM | POA: Diagnosis present

## 2018-01-13 DIAGNOSIS — Z7902 Long term (current) use of antithrombotics/antiplatelets: Secondary | ICD-10-CM

## 2018-01-13 DIAGNOSIS — D539 Nutritional anemia, unspecified: Secondary | ICD-10-CM | POA: Diagnosis present

## 2018-01-13 DIAGNOSIS — E875 Hyperkalemia: Secondary | ICD-10-CM | POA: Diagnosis not present

## 2018-01-13 DIAGNOSIS — S299XXA Unspecified injury of thorax, initial encounter: Secondary | ICD-10-CM | POA: Diagnosis not present

## 2018-01-13 DIAGNOSIS — G9341 Metabolic encephalopathy: Secondary | ICD-10-CM | POA: Diagnosis present

## 2018-01-13 DIAGNOSIS — G4731 Primary central sleep apnea: Secondary | ICD-10-CM | POA: Diagnosis present

## 2018-01-13 DIAGNOSIS — M2550 Pain in unspecified joint: Secondary | ICD-10-CM | POA: Diagnosis not present

## 2018-01-13 DIAGNOSIS — I129 Hypertensive chronic kidney disease with stage 1 through stage 4 chronic kidney disease, or unspecified chronic kidney disease: Secondary | ICD-10-CM | POA: Diagnosis present

## 2018-01-13 DIAGNOSIS — Z79899 Other long term (current) drug therapy: Secondary | ICD-10-CM

## 2018-01-13 DIAGNOSIS — F259 Schizoaffective disorder, unspecified: Secondary | ICD-10-CM | POA: Diagnosis present

## 2018-01-13 DIAGNOSIS — A419 Sepsis, unspecified organism: Secondary | ICD-10-CM | POA: Diagnosis present

## 2018-01-13 DIAGNOSIS — R278 Other lack of coordination: Secondary | ICD-10-CM | POA: Diagnosis not present

## 2018-01-13 DIAGNOSIS — R197 Diarrhea, unspecified: Secondary | ICD-10-CM | POA: Diagnosis present

## 2018-01-13 DIAGNOSIS — R5381 Other malaise: Secondary | ICD-10-CM | POA: Diagnosis not present

## 2018-01-13 DIAGNOSIS — N390 Urinary tract infection, site not specified: Secondary | ICD-10-CM | POA: Diagnosis present

## 2018-01-13 DIAGNOSIS — R6521 Severe sepsis with septic shock: Secondary | ICD-10-CM | POA: Diagnosis present

## 2018-01-13 DIAGNOSIS — H409 Unspecified glaucoma: Secondary | ICD-10-CM | POA: Diagnosis present

## 2018-01-13 DIAGNOSIS — I9589 Other hypotension: Secondary | ICD-10-CM | POA: Diagnosis not present

## 2018-01-13 DIAGNOSIS — J9690 Respiratory failure, unspecified, unspecified whether with hypoxia or hypercapnia: Secondary | ICD-10-CM | POA: Diagnosis not present

## 2018-01-13 DIAGNOSIS — N179 Acute kidney failure, unspecified: Secondary | ICD-10-CM | POA: Diagnosis present

## 2018-01-13 DIAGNOSIS — Z888 Allergy status to other drugs, medicaments and biological substances status: Secondary | ICD-10-CM | POA: Diagnosis not present

## 2018-01-13 DIAGNOSIS — G9340 Encephalopathy, unspecified: Secondary | ICD-10-CM | POA: Diagnosis not present

## 2018-01-13 DIAGNOSIS — E869 Volume depletion, unspecified: Secondary | ICD-10-CM | POA: Diagnosis present

## 2018-01-13 DIAGNOSIS — I959 Hypotension, unspecified: Secondary | ICD-10-CM

## 2018-01-13 DIAGNOSIS — G4733 Obstructive sleep apnea (adult) (pediatric): Secondary | ICD-10-CM | POA: Diagnosis present

## 2018-01-13 DIAGNOSIS — R579 Shock, unspecified: Secondary | ICD-10-CM | POA: Diagnosis not present

## 2018-01-13 DIAGNOSIS — N251 Nephrogenic diabetes insipidus: Secondary | ICD-10-CM | POA: Diagnosis present

## 2018-01-13 DIAGNOSIS — S199XXA Unspecified injury of neck, initial encounter: Secondary | ICD-10-CM | POA: Diagnosis not present

## 2018-01-13 DIAGNOSIS — Z789 Other specified health status: Secondary | ICD-10-CM | POA: Diagnosis not present

## 2018-01-13 DIAGNOSIS — N189 Chronic kidney disease, unspecified: Secondary | ICD-10-CM | POA: Diagnosis not present

## 2018-01-13 DIAGNOSIS — W19XXXA Unspecified fall, initial encounter: Secondary | ICD-10-CM | POA: Diagnosis not present

## 2018-01-13 DIAGNOSIS — R400 Somnolence: Secondary | ICD-10-CM | POA: Diagnosis not present

## 2018-01-13 DIAGNOSIS — R2689 Other abnormalities of gait and mobility: Secondary | ICD-10-CM | POA: Diagnosis not present

## 2018-01-13 DIAGNOSIS — R0902 Hypoxemia: Secondary | ICD-10-CM

## 2018-01-13 DIAGNOSIS — J690 Pneumonitis due to inhalation of food and vomit: Secondary | ICD-10-CM

## 2018-01-13 DIAGNOSIS — R41841 Cognitive communication deficit: Secondary | ICD-10-CM | POA: Diagnosis not present

## 2018-01-13 DIAGNOSIS — Z66 Do not resuscitate: Secondary | ICD-10-CM | POA: Diagnosis present

## 2018-01-13 DIAGNOSIS — S0003XA Contusion of scalp, initial encounter: Secondary | ICD-10-CM | POA: Diagnosis not present

## 2018-01-13 DIAGNOSIS — E870 Hyperosmolality and hypernatremia: Secondary | ICD-10-CM | POA: Diagnosis not present

## 2018-01-13 DIAGNOSIS — R1312 Dysphagia, oropharyngeal phase: Secondary | ICD-10-CM | POA: Diagnosis not present

## 2018-01-13 HISTORY — DX: Major depressive disorder, single episode, unspecified: F32.9

## 2018-01-13 HISTORY — DX: Anemia, unspecified: D64.9

## 2018-01-13 HISTORY — DX: Unspecified osteoarthritis, unspecified site: M19.90

## 2018-01-13 HISTORY — DX: Depression, unspecified: F32.A

## 2018-01-13 HISTORY — DX: Syncope and collapse: R55

## 2018-01-13 HISTORY — DX: Obstructive sleep apnea (adult) (pediatric): G47.33

## 2018-01-13 HISTORY — DX: Anxiety disorder, unspecified: F41.9

## 2018-01-13 LAB — BASIC METABOLIC PANEL
Anion gap: 12 (ref 5–15)
BUN: 31 mg/dL — ABNORMAL HIGH (ref 8–23)
CALCIUM: 9 mg/dL (ref 8.9–10.3)
CO2: 18 mmol/L — ABNORMAL LOW (ref 22–32)
Chloride: 111 mmol/L (ref 98–111)
Creatinine, Ser: 1.78 mg/dL — ABNORMAL HIGH (ref 0.44–1.00)
GFR calc Af Amer: 33 mL/min — ABNORMAL LOW (ref >60)
GFR calc non Af Amer: 29 mL/min — ABNORMAL LOW (ref >60)
GLUCOSE: 131 mg/dL — AB (ref 70–99)
Potassium: 4.1 mmol/L (ref 3.5–5.1)
Sodium: 141 mmol/L (ref 135–145)

## 2018-01-13 LAB — URINALYSIS, ROUTINE W REFLEX MICROSCOPIC
Bilirubin Urine: NEGATIVE
Glucose, UA: NEGATIVE mg/dL
Ketones, ur: NEGATIVE mg/dL
Leukocytes, UA: NEGATIVE
Nitrite: NEGATIVE
Protein, ur: NEGATIVE mg/dL
Specific Gravity, Urine: 1.015 (ref 1.005–1.030)
pH: 5 (ref 5.0–8.0)

## 2018-01-13 LAB — CBC WITH DIFFERENTIAL/PLATELET
Abs Immature Granulocytes: 0 10*3/uL (ref 0.00–0.07)
Basophils Absolute: 0 10*3/uL (ref 0.0–0.1)
Basophils Relative: 0 %
Eosinophils Absolute: 0 10*3/uL (ref 0.0–0.5)
Eosinophils Relative: 0 %
HCT: 35.1 % — ABNORMAL LOW (ref 36.0–46.0)
Hemoglobin: 11 g/dL — ABNORMAL LOW (ref 12.0–15.0)
Lymphocytes Relative: 2 %
Lymphs Abs: 0.3 10*3/uL — ABNORMAL LOW (ref 0.7–4.0)
MCH: 30.3 pg (ref 26.0–34.0)
MCHC: 31.3 g/dL (ref 30.0–36.0)
MCV: 96.7 fL (ref 80.0–100.0)
Monocytes Absolute: 0.4 10*3/uL (ref 0.1–1.0)
Monocytes Relative: 3 %
Neutro Abs: 13.6 10*3/uL — ABNORMAL HIGH (ref 1.7–7.7)
Neutrophils Relative %: 95 %
Platelets: 235 10*3/uL (ref 150–400)
RBC: 3.63 MIL/uL — ABNORMAL LOW (ref 3.87–5.11)
RDW: 14.6 % (ref 11.5–15.5)
WBC: 14.3 10*3/uL — ABNORMAL HIGH (ref 4.0–10.5)
nRBC: 0 % (ref 0.0–0.2)
nRBC: 0 /100 WBC

## 2018-01-13 LAB — I-STAT ARTERIAL BLOOD GAS, ED
Acid-base deficit: 5 mmol/L — ABNORMAL HIGH (ref 0.0–2.0)
Bicarbonate: 20.2 mmol/L (ref 20.0–28.0)
O2 Saturation: 100 %
Patient temperature: 98
TCO2: 21 mmol/L — ABNORMAL LOW (ref 22–32)
pCO2 arterial: 35.4 mmHg (ref 32.0–48.0)
pH, Arterial: 7.363 (ref 7.350–7.450)
pO2, Arterial: 413 mmHg — ABNORMAL HIGH (ref 83.0–108.0)

## 2018-01-13 LAB — COMPREHENSIVE METABOLIC PANEL
ALT: 48 U/L — ABNORMAL HIGH (ref 0–44)
AST: 126 U/L — ABNORMAL HIGH (ref 15–41)
Albumin: 3.3 g/dL — ABNORMAL LOW (ref 3.5–5.0)
Alkaline Phosphatase: 73 U/L (ref 38–126)
Anion gap: 8 (ref 5–15)
BUN: 33 mg/dL — ABNORMAL HIGH (ref 8–23)
CO2: 21 mmol/L — ABNORMAL LOW (ref 22–32)
Calcium: 9.5 mg/dL (ref 8.9–10.3)
Chloride: 110 mmol/L (ref 98–111)
Creatinine, Ser: 1.96 mg/dL — ABNORMAL HIGH (ref 0.44–1.00)
GFR calc Af Amer: 30 mL/min — ABNORMAL LOW (ref >60)
GFR calc non Af Amer: 26 mL/min — ABNORMAL LOW (ref >60)
Glucose, Bld: 117 mg/dL — ABNORMAL HIGH (ref 70–99)
Potassium: 4.2 mmol/L (ref 3.5–5.1)
Sodium: 139 mmol/L (ref 135–145)
Total Bilirubin: 0.8 mg/dL (ref 0.3–1.2)
Total Protein: 5.5 g/dL — ABNORMAL LOW (ref 6.5–8.1)

## 2018-01-13 LAB — TYPE AND SCREEN
ABO/RH(D): A POS
Antibody Screen: NEGATIVE

## 2018-01-13 LAB — LITHIUM LEVEL: Lithium Lvl: 1.09 mmol/L (ref 0.60–1.20)

## 2018-01-13 LAB — CBG MONITORING, ED: Glucose-Capillary: 101 mg/dL — ABNORMAL HIGH (ref 70–99)

## 2018-01-13 LAB — I-STAT CG4 LACTIC ACID, ED
Lactic Acid, Venous: 2.2 mmol/L (ref 0.5–1.9)
Lactic Acid, Venous: 1.44 mmol/L (ref 0.5–1.9)

## 2018-01-13 LAB — ABO/RH: ABO/RH(D): A POS

## 2018-01-13 LAB — PROCALCITONIN: Procalcitonin: 0.37 ng/mL

## 2018-01-13 LAB — MRSA PCR SCREENING: MRSA by PCR: NEGATIVE

## 2018-01-13 LAB — GLUCOSE, CAPILLARY: Glucose-Capillary: 120 mg/dL — ABNORMAL HIGH (ref 70–99)

## 2018-01-13 MED ORDER — SODIUM CHLORIDE 0.9 % IV SOLN
INTRAVENOUS | Status: DC
  Administered 2018-01-13 – 2018-01-16 (×5): via INTRAVENOUS

## 2018-01-13 MED ORDER — VANCOMYCIN HCL 10 G IV SOLR
1500.0000 mg | Freq: Once | INTRAVENOUS | Status: AC
  Administered 2018-01-13: 1500 mg via INTRAVENOUS
  Filled 2018-01-13: qty 1500

## 2018-01-13 MED ORDER — SODIUM CHLORIDE 0.9 % IV BOLUS
1000.0000 mL | Freq: Once | INTRAVENOUS | Status: AC
  Administered 2018-01-13: 1000 mL via INTRAVENOUS

## 2018-01-13 MED ORDER — SODIUM CHLORIDE 0.9 % IV SOLN
250.0000 mL | INTRAVENOUS | Status: DC
  Administered 2018-01-13 – 2018-01-18 (×4): 250 mL via INTRAVENOUS

## 2018-01-13 MED ORDER — PIPERACILLIN-TAZOBACTAM 3.375 G IVPB
3.3750 g | Freq: Three times a day (TID) | INTRAVENOUS | Status: DC
  Administered 2018-01-13 – 2018-01-18 (×15): 3.375 g via INTRAVENOUS
  Filled 2018-01-13 (×17): qty 50

## 2018-01-13 MED ORDER — NOREPINEPHRINE 4 MG/250ML-% IV SOLN
INTRAVENOUS | Status: AC
  Administered 2018-01-13: 2 ug/min via INTRAVENOUS
  Filled 2018-01-13: qty 250

## 2018-01-13 MED ORDER — SODIUM CHLORIDE 0.9 % IV BOLUS
2000.0000 mL | Freq: Once | INTRAVENOUS | Status: AC
  Administered 2018-01-13: 2000 mL via INTRAVENOUS

## 2018-01-13 MED ORDER — VANCOMYCIN HCL IN DEXTROSE 750-5 MG/150ML-% IV SOLN
750.0000 mg | INTRAVENOUS | Status: DC
  Administered 2018-01-14: 750 mg via INTRAVENOUS
  Filled 2018-01-13 (×2): qty 150

## 2018-01-13 MED ORDER — CHLORHEXIDINE GLUCONATE 0.12 % MT SOLN
15.0000 mL | Freq: Two times a day (BID) | OROMUCOSAL | Status: DC
  Administered 2018-01-13 – 2018-01-22 (×16): 15 mL via OROMUCOSAL
  Filled 2018-01-13 (×20): qty 15

## 2018-01-13 MED ORDER — ORAL CARE MOUTH RINSE
15.0000 mL | Freq: Two times a day (BID) | OROMUCOSAL | Status: DC
  Administered 2018-01-14 – 2018-01-18 (×7): 15 mL via OROMUCOSAL

## 2018-01-13 MED ORDER — PIPERACILLIN-TAZOBACTAM 3.375 G IVPB 30 MIN
3.3750 g | Freq: Once | INTRAVENOUS | Status: AC
  Administered 2018-01-13: 3.375 g via INTRAVENOUS
  Filled 2018-01-13: qty 50

## 2018-01-13 MED ORDER — NOREPINEPHRINE 4 MG/250ML-% IV SOLN
2.0000 ug/min | INTRAVENOUS | Status: DC
  Administered 2018-01-13: 2 ug/min via INTRAVENOUS
  Administered 2018-01-14: 5 ug/min via INTRAVENOUS
  Administered 2018-01-14: 2.5 ug/min via INTRAVENOUS
  Filled 2018-01-13 (×2): qty 250

## 2018-01-13 NOTE — ED Notes (Signed)
Patient transported to CT 

## 2018-01-13 NOTE — Progress Notes (Addendum)
Pharmacy Antibiotic Note  Bridget Jackson is a 67 y.o. female admitted on 01/13/2018. Starting abx for sepsis with possible urinary source.  eCrCl ~ 27 ml/min. SCr 1 > 1.9.  Plan: -Zosyn 3.375 g IV q8h -Vancomycin 750 mg IV q24h -Monitor renal fx, cultures, levels as needed    Temp (24hrs), Avg:98 F (36.7 C), Min:97.9 F (36.6 C), Max:98 F (36.7 C)  Recent Labs  Lab 01/13/18 1344 01/13/18 1355  WBC 14.3*  --   CREATININE 1.96*  --   LATICACIDVEN  --  2.20*    Estimated Creatinine Clearance: 27.4 mL/min (A) (by C-G formula based on SCr of 1.96 mg/dL (H)).    Allergies  Allergen Reactions  . Lipitor [Atorvastatin Calcium]   . Provera [Medroxyprogesterone Acetate]   . Prednisone     Other reaction(s): Other    Antimicrobials this admission: 12/12 vancomycin > 12/12 zosyn >  Dose adjustments this admission: N/A  Microbiology results: 12/12 blood cx: 1212 urine cx:   Bridget Jackson 01/13/2018 3:57 PM
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
## 2018-01-13 NOTE — Telephone Encounter (Signed)
Bridget Jackson, [facility] stated that pt was set up for bipap:     Set up on 12/22/17. And will need a follow up appt in 60-90 days from the start of care.

## 2018-01-13 NOTE — ED Notes (Signed)
Culture sent with UA 

## 2018-01-13 NOTE — ED Notes (Signed)
Pt placed on NRB.
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
## 2018-01-13 NOTE — ED Notes (Signed)
IV team at bedside 

## 2018-01-13 NOTE — Telephone Encounter (Signed)
LMVM for pt or sister relating how pt is doing on bipap.  Please return call.
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
## 2018-01-14 DIAGNOSIS — R4182 Altered mental status, unspecified: Secondary | ICD-10-CM

## 2018-01-14 DIAGNOSIS — I959 Hypotension, unspecified: Secondary | ICD-10-CM

## 2018-01-14 LAB — HEPATIC FUNCTION PANEL
ALT: 69 U/L — ABNORMAL HIGH (ref 0–44)
AST: 149 U/L — ABNORMAL HIGH (ref 15–41)
Albumin: 2.9 g/dL — ABNORMAL LOW (ref 3.5–5.0)
Alkaline Phosphatase: 70 U/L (ref 38–126)
Bilirubin, Direct: 0.4 mg/dL — ABNORMAL HIGH (ref 0.0–0.2)
Indirect Bilirubin: 0.9 mg/dL (ref 0.3–0.9)
Total Bilirubin: 1.3 mg/dL — ABNORMAL HIGH (ref 0.3–1.2)
Total Protein: 5.2 g/dL — ABNORMAL LOW (ref 6.5–8.1)

## 2018-01-14 LAB — CBC
HCT: 34.2 % — ABNORMAL LOW (ref 36.0–46.0)
Hemoglobin: 10.5 g/dL — ABNORMAL LOW (ref 12.0–15.0)
MCH: 30 pg (ref 26.0–34.0)
MCHC: 30.7 g/dL (ref 30.0–36.0)
MCV: 97.7 fL (ref 80.0–100.0)
NRBC: 0 % (ref 0.0–0.2)
PLATELETS: 269 10*3/uL (ref 150–400)
RBC: 3.5 MIL/uL — ABNORMAL LOW (ref 3.87–5.11)
RDW: 15 % (ref 11.5–15.5)
WBC: 13.3 10*3/uL — AB (ref 4.0–10.5)

## 2018-01-14 LAB — BASIC METABOLIC PANEL
ANION GAP: 8 (ref 5–15)
BUN: 26 mg/dL — ABNORMAL HIGH (ref 8–23)
CO2: 19 mmol/L — ABNORMAL LOW (ref 22–32)
Calcium: 8.4 mg/dL — ABNORMAL LOW (ref 8.9–10.3)
Chloride: 116 mmol/L — ABNORMAL HIGH (ref 98–111)
Creatinine, Ser: 1.47 mg/dL — ABNORMAL HIGH (ref 0.44–1.00)
GFR calc Af Amer: 42 mL/min — ABNORMAL LOW (ref >60)
GFR calc non Af Amer: 36 mL/min — ABNORMAL LOW (ref >60)
Glucose, Bld: 117 mg/dL — ABNORMAL HIGH (ref 70–99)
Potassium: 3.8 mmol/L (ref 3.5–5.1)
Sodium: 143 mmol/L (ref 135–145)

## 2018-01-14 LAB — URINE CULTURE: Culture: <10000 — AB

## 2018-01-14 LAB — CORTISOL: Cortisol, Plasma: 25.7 ug/dL

## 2018-01-14 LAB — PHOSPHORUS: Phosphorus: 3.3 mg/dL (ref 2.5–4.6)

## 2018-01-14 LAB — GLUCOSE, CAPILLARY: Glucose-Capillary: 100 mg/dL — ABNORMAL HIGH (ref 70–99)

## 2018-01-14 LAB — CK: CK TOTAL: 1184 U/L — AB (ref 38–234)

## 2018-01-14 LAB — MAGNESIUM: Magnesium: 2.2 mg/dL (ref 1.7–2.4)

## 2018-01-14 MED ORDER — TRAZODONE HCL 50 MG PO TABS
50.0000 mg | ORAL_TABLET | Freq: Every day | ORAL | Status: DC
  Filled 2018-01-14: qty 1

## 2018-01-14 MED ORDER — LITHIUM CITRATE 300 MG/5 ML PO SYRP
300.0000 mg | Freq: Two times a day (BID) | ORAL | Status: DC
  Administered 2018-01-14 – 2018-01-15 (×2): 300 mg
  Filled 2018-01-14 (×3): qty 5

## 2018-01-14 MED ORDER — LACTATED RINGERS IV BOLUS
1000.0000 mL | Freq: Once | INTRAVENOUS | Status: AC
  Administered 2018-01-14: 1000 mL via INTRAVENOUS

## 2018-01-14 NOTE — Progress Notes (Signed)
[facility] Progress Note Patient Name: Bridget Jackson DOB: Oct 06, 1950 MRN: [id number]   Date of Service  01/14/2018  HPI/Events of Note  Notified patient has due lithium dose however made NPO due to decreased sensorium earlier in the day.  eICU Interventions  Patient more awake now and discussed with bedside nurse who will attempt a bedside swallow eval.     Intervention Category Minor Interventions: Other:  Judd Lien 01/14/2018, 11:31 PM
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
## 2018-01-14 NOTE — Progress Notes (Signed)
Patient passed bedside swallow eval. No coughing noted after sips. Lithium dose given. Sister is at bedside. Will continue to monitor.

## 2018-01-15 ENCOUNTER — Inpatient Hospital Stay (HOSPITAL_COMMUNITY): Payer: Medicare Other

## 2018-01-15 DIAGNOSIS — I9589 Other hypotension: Secondary | ICD-10-CM

## 2018-01-15 DIAGNOSIS — R410 Disorientation, unspecified: Secondary | ICD-10-CM

## 2018-01-15 DIAGNOSIS — R579 Shock, unspecified: Secondary | ICD-10-CM

## 2018-01-15 LAB — CBC WITH DIFFERENTIAL/PLATELET
Abs Immature Granulocytes: 0.03 10*3/uL (ref 0.00–0.07)
Basophils Absolute: 0 10*3/uL (ref 0.0–0.1)
Basophils Relative: 0 %
Eosinophils Absolute: 0.1 10*3/uL (ref 0.0–0.5)
Eosinophils Relative: 1 %
HCT: 33.7 % — ABNORMAL LOW (ref 36.0–46.0)
Hemoglobin: 10.3 g/dL — ABNORMAL LOW (ref 12.0–15.0)
Immature Granulocytes: 0 %
LYMPHS ABS: 1 10*3/uL (ref 0.7–4.0)
Lymphocytes Relative: 9 %
MCH: 30.7 pg (ref 26.0–34.0)
MCHC: 30.6 g/dL (ref 30.0–36.0)
MCV: 100.3 fL — AB (ref 80.0–100.0)
Monocytes Absolute: 1 10*3/uL (ref 0.1–1.0)
Monocytes Relative: 9 %
Neutro Abs: 9.1 10*3/uL — ABNORMAL HIGH (ref 1.7–7.7)
Neutrophils Relative %: 81 %
PLATELETS: 255 10*3/uL (ref 150–400)
RBC: 3.36 MIL/uL — ABNORMAL LOW (ref 3.87–5.11)
RDW: 15.8 % — ABNORMAL HIGH (ref 11.5–15.5)
WBC: 11.2 10*3/uL — ABNORMAL HIGH (ref 4.0–10.5)
nRBC: 0 % (ref 0.0–0.2)

## 2018-01-15 LAB — BASIC METABOLIC PANEL
Anion gap: 6 (ref 5–15)
BUN: 20 mg/dL (ref 8–23)
CALCIUM: 8.8 mg/dL — AB (ref 8.9–10.3)
CO2: 19 mmol/L — ABNORMAL LOW (ref 22–32)
Chloride: 125 mmol/L — ABNORMAL HIGH (ref 98–111)
Creatinine, Ser: 1.38 mg/dL — ABNORMAL HIGH (ref 0.44–1.00)
GFR calc Af Amer: 45 mL/min — ABNORMAL LOW (ref >60)
GFR calc non Af Amer: 39 mL/min — ABNORMAL LOW (ref >60)
Glucose, Bld: 124 mg/dL — ABNORMAL HIGH (ref 70–99)
Potassium: 3.9 mmol/L (ref 3.5–5.1)
Sodium: 150 mmol/L — ABNORMAL HIGH (ref 135–145)

## 2018-01-15 LAB — PHOSPHORUS: Phosphorus: 2.3 mg/dL — ABNORMAL LOW (ref 2.5–4.6)

## 2018-01-15 LAB — ECHOCARDIOGRAM COMPLETE
Height: 63 in
Weight: 3178.15 oz

## 2018-01-15 LAB — MAGNESIUM: Magnesium: 2.3 mg/dL (ref 1.7–2.4)

## 2018-01-15 MED ORDER — FLUPHENAZINE HCL 1 MG PO TABS
4.0000 mg | ORAL_TABLET | Freq: Every day | ORAL | Status: DC
  Administered 2018-01-15 – 2018-01-23 (×9): 4 mg via ORAL
  Filled 2018-01-15 (×10): qty 4

## 2018-01-15 MED ORDER — LITHIUM CARBONATE 300 MG PO CAPS
300.0000 mg | ORAL_CAPSULE | Freq: Two times a day (BID) | ORAL | Status: DC
  Administered 2018-01-15 – 2018-01-24 (×19): 300 mg via ORAL
  Filled 2018-01-15 (×19): qty 1

## 2018-01-15 MED ORDER — TRAZODONE HCL 50 MG PO TABS
50.0000 mg | ORAL_TABLET | Freq: Every day | ORAL | Status: DC
  Administered 2018-01-15 – 2018-01-23 (×9): 50 mg via ORAL
  Filled 2018-01-15 (×9): qty 1
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
## 2018-01-15 NOTE — Progress Notes (Signed)
  Echocardiogram 2D Echocardiogram has been performed.  Merrie Roof F 01/15/2018, 4:49 PM

## 2018-01-15 NOTE — Progress Notes (Signed)
Patient transferred to [id number], nurse called report. Patient transferred on tele monitor without any complications. Belongings were transferred with patient.

## 2018-01-16 DIAGNOSIS — E870 Hyperosmolality and hypernatremia: Secondary | ICD-10-CM

## 2018-01-16 DIAGNOSIS — F259 Schizoaffective disorder, unspecified: Secondary | ICD-10-CM

## 2018-01-16 DIAGNOSIS — I10 Essential (primary) hypertension: Secondary | ICD-10-CM

## 2018-01-16 DIAGNOSIS — E785 Hyperlipidemia, unspecified: Secondary | ICD-10-CM

## 2018-01-16 LAB — BASIC METABOLIC PANEL
Anion gap: 8 (ref 5–15)
Anion gap: 8 (ref 5–15)
Anion gap: 8 (ref 5–15)
Anion gap: 8 (ref 5–15)
BUN: 16 mg/dL (ref 8–23)
BUN: 17 mg/dL (ref 8–23)
BUN: 17 mg/dL (ref 8–23)
BUN: 16 mg/dL (ref 8–23)
BUN: 15 mg/dL (ref 8–23)
CO2: 20 mmol/L — ABNORMAL LOW (ref 22–32)
CO2: 17 mmol/L — ABNORMAL LOW (ref 22–32)
CO2: 18 mmol/L — AB (ref 22–32)
CO2: 17 mmol/L — AB (ref 22–32)
CO2: 16 mmol/L — ABNORMAL LOW (ref 22–32)
Calcium: 8.8 mg/dL — ABNORMAL LOW (ref 8.9–10.3)
Calcium: 8.8 mg/dL — ABNORMAL LOW (ref 8.9–10.3)
Calcium: 8.7 mg/dL — ABNORMAL LOW (ref 8.9–10.3)
Calcium: 8.5 mg/dL — ABNORMAL LOW (ref 8.9–10.3)
Calcium: 8.4 mg/dL — ABNORMAL LOW (ref 8.9–10.3)
Chloride: 127 mmol/L — ABNORMAL HIGH (ref 98–111)
Chloride: >130 mmol/L (ref 98–111)
Chloride: 127 mmol/L — ABNORMAL HIGH (ref 98–111)
Chloride: 126 mmol/L — ABNORMAL HIGH (ref 98–111)
Chloride: 126 mmol/L — ABNORMAL HIGH (ref 98–111)
Creatinine, Ser: 1.42 mg/dL — ABNORMAL HIGH (ref 0.44–1.00)
Creatinine, Ser: 1.4 mg/dL — ABNORMAL HIGH (ref 0.44–1.00)
Creatinine, Ser: 1.34 mg/dL — ABNORMAL HIGH (ref 0.44–1.00)
Creatinine, Ser: 1.37 mg/dL — ABNORMAL HIGH (ref 0.44–1.00)
Creatinine, Ser: 1.5 mg/dL — ABNORMAL HIGH (ref 0.44–1.00)
GFR calc Af Amer: 44 mL/min — ABNORMAL LOW (ref >60)
GFR calc Af Amer: 45 mL/min — ABNORMAL LOW (ref >60)
GFR calc Af Amer: 47 mL/min — ABNORMAL LOW (ref >60)
GFR calc Af Amer: 46 mL/min — ABNORMAL LOW (ref >60)
GFR calc Af Amer: 41 mL/min — ABNORMAL LOW (ref >60)
GFR calc non Af Amer: 38 mL/min — ABNORMAL LOW (ref >60)
GFR calc non Af Amer: 39 mL/min — ABNORMAL LOW (ref >60)
GFR calc non Af Amer: 41 mL/min — ABNORMAL LOW (ref >60)
GFR calc non Af Amer: 40 mL/min — ABNORMAL LOW (ref >60)
GFR calc non Af Amer: 35 mL/min — ABNORMAL LOW (ref >60)
GLUCOSE: 136 mg/dL — AB (ref 70–99)
GLUCOSE: 205 mg/dL — AB (ref 70–99)
Glucose, Bld: 111 mg/dL — ABNORMAL HIGH (ref 70–99)
Glucose, Bld: 113 mg/dL — ABNORMAL HIGH (ref 70–99)
Glucose, Bld: 140 mg/dL — ABNORMAL HIGH (ref 70–99)
Potassium: 3.5 mmol/L (ref 3.5–5.1)
Potassium: 3.6 mmol/L (ref 3.5–5.1)
Potassium: 3.4 mmol/L — ABNORMAL LOW (ref 3.5–5.1)
Potassium: 3.5 mmol/L (ref 3.5–5.1)
Potassium: 3.1 mmol/L — ABNORMAL LOW (ref 3.5–5.1)
Sodium: 155 mmol/L — ABNORMAL HIGH (ref 135–145)
Sodium: 156 mmol/L — ABNORMAL HIGH (ref 135–145)
Sodium: 153 mmol/L — ABNORMAL HIGH (ref 135–145)
Sodium: 151 mmol/L — ABNORMAL HIGH (ref 135–145)
Sodium: 150 mmol/L — ABNORMAL HIGH (ref 135–145)

## 2018-01-16 LAB — SODIUM, URINE, RANDOM: Sodium, Ur: 33 mmol/L

## 2018-01-16 LAB — OSMOLALITY: Osmolality: 318 mOsm/kg — ABNORMAL HIGH (ref 275–295)

## 2018-01-16 LAB — OSMOLALITY, URINE: Osmolality, Ur: 136 mOsm/kg — ABNORMAL LOW (ref 300–900)

## 2018-01-16 LAB — MAGNESIUM: Magnesium: 2.1 mg/dL (ref 1.7–2.4)

## 2018-01-16 MED ORDER — DEXTROSE 5 % IV SOLN
INTRAVENOUS | Status: DC
  Administered 2018-01-16 – 2018-01-17 (×3): via INTRAVENOUS

## 2018-01-16 NOTE — Progress Notes (Signed)
Pharmacy Antibiotic Note  Bridget Jackson is a 67 y.o. female admitted on 01/13/2018. Abx for sepsis with aspiration pneumonia vs urinary source.  eCrCl ~ 41 ml/min. SCr 1.4  Plan: -Zosyn 3.375 g IV q8h -Monitor renal fxn, cultures, and clinical status  Height: 5\' 3"  (160 cm) Weight: 199 lb 11.8 oz (90.6 kg) IBW/kg (Calculated) : 52.4  Temp (24hrs), Avg:98.7 F (37.1 C), Min:97.5 F (36.4 C), Max:99.4 F (37.4 C)  Recent Labs  Lab 01/13/18 1344 01/13/18 1355 01/13/18 1554 01/13/18 1611 01/14/18 0306 01/15/18 0228 01/16/18 0525 01/16/18 0938  WBC 14.3*  --   --   --  13.3* 11.2*  --   --   CREATININE 1.96*  --  1.78*  --  1.47* 1.38* 1.42* 1.40*  LATICACIDVEN  --  2.20*  --  1.44  --   --   --   --     Estimated Creatinine Clearance: 41.1 mL/min (A) (by C-G formula based on SCr of 1.4 mg/dL (H)).    Allergies  Allergen Reactions  . Lipitor [Atorvastatin Calcium]   . Provera [Medroxyprogesterone Acetate]   . Prednisone     Other reaction(s): Other    Antimicrobials this admission: 12/12 vancomycin > 12/14 12/12 zosyn >   Dose adjustments this admission: N/A  Microbiology results: 12/12 blood cx: ngtd 1212 urine cx: negative  [name], PharmD PGY1 Pharmacy Resident 01/16/2018    10:34 AM Please check AMION for all [facility] numbers
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
## 2018-01-17 LAB — RETICULOCYTES
Immature Retic Fract: 29.5 % — ABNORMAL HIGH (ref 2.3–15.9)
RBC.: 3.22 MIL/uL — ABNORMAL LOW (ref 3.87–5.11)
Retic Count, Absolute: 69.6 10*3/uL (ref 19.0–186.0)
Retic Ct Pct: 2.2 % (ref 0.4–3.1)

## 2018-01-17 LAB — BASIC METABOLIC PANEL
ANION GAP: 5 (ref 5–15)
ANION GAP: 6 (ref 5–15)
Anion gap: 9 (ref 5–15)
Anion gap: 7 (ref 5–15)
Anion gap: 4 — ABNORMAL LOW (ref 5–15)
BUN: 13 mg/dL (ref 8–23)
BUN: 12 mg/dL (ref 8–23)
BUN: 12 mg/dL (ref 8–23)
BUN: 12 mg/dL (ref 8–23)
BUN: 11 mg/dL (ref 8–23)
CALCIUM: 8.6 mg/dL — AB (ref 8.9–10.3)
CO2: 19 mmol/L — ABNORMAL LOW (ref 22–32)
CO2: 19 mmol/L — ABNORMAL LOW (ref 22–32)
CO2: 21 mmol/L — ABNORMAL LOW (ref 22–32)
CO2: 19 mmol/L — ABNORMAL LOW (ref 22–32)
CO2: 19 mmol/L — ABNORMAL LOW (ref 22–32)
Calcium: 8.8 mg/dL — ABNORMAL LOW (ref 8.9–10.3)
Calcium: 8.6 mg/dL — ABNORMAL LOW (ref 8.9–10.3)
Calcium: 8.5 mg/dL — ABNORMAL LOW (ref 8.9–10.3)
Calcium: 8.8 mg/dL — ABNORMAL LOW (ref 8.9–10.3)
Chloride: 124 mmol/L — ABNORMAL HIGH (ref 98–111)
Chloride: 126 mmol/L — ABNORMAL HIGH (ref 98–111)
Chloride: 127 mmol/L — ABNORMAL HIGH (ref 98–111)
Chloride: 127 mmol/L — ABNORMAL HIGH (ref 98–111)
Chloride: 126 mmol/L — ABNORMAL HIGH (ref 98–111)
Creatinine, Ser: 1.52 mg/dL — ABNORMAL HIGH (ref 0.44–1.00)
Creatinine, Ser: 1.39 mg/dL — ABNORMAL HIGH (ref 0.44–1.00)
Creatinine, Ser: 1.44 mg/dL — ABNORMAL HIGH (ref 0.44–1.00)
Creatinine, Ser: 1.43 mg/dL — ABNORMAL HIGH (ref 0.44–1.00)
Creatinine, Ser: 1.42 mg/dL — ABNORMAL HIGH (ref 0.44–1.00)
GFR calc Af Amer: 40 mL/min — ABNORMAL LOW (ref >60)
GFR calc Af Amer: 45 mL/min — ABNORMAL LOW (ref >60)
GFR calc Af Amer: 43 mL/min — ABNORMAL LOW (ref >60)
GFR calc Af Amer: 44 mL/min — ABNORMAL LOW (ref >60)
GFR calc Af Amer: 44 mL/min — ABNORMAL LOW (ref >60)
GFR calc non Af Amer: 35 mL/min — ABNORMAL LOW (ref >60)
GFR calc non Af Amer: 39 mL/min — ABNORMAL LOW (ref >60)
GFR calc non Af Amer: 37 mL/min — ABNORMAL LOW (ref >60)
GFR calc non Af Amer: 38 mL/min — ABNORMAL LOW (ref >60)
GFR calc non Af Amer: 38 mL/min — ABNORMAL LOW (ref >60)
GLUCOSE: 129 mg/dL — AB (ref 70–99)
GLUCOSE: 127 mg/dL — AB (ref 70–99)
Glucose, Bld: 133 mg/dL — ABNORMAL HIGH (ref 70–99)
Glucose, Bld: 136 mg/dL — ABNORMAL HIGH (ref 70–99)
Glucose, Bld: 108 mg/dL — ABNORMAL HIGH (ref 70–99)
POTASSIUM: 3.3 mmol/L — AB (ref 3.5–5.1)
Potassium: 3.3 mmol/L — ABNORMAL LOW (ref 3.5–5.1)
Potassium: 3.3 mmol/L — ABNORMAL LOW (ref 3.5–5.1)
Potassium: 3.2 mmol/L — ABNORMAL LOW (ref 3.5–5.1)
Potassium: 3.5 mmol/L (ref 3.5–5.1)
Sodium: 152 mmol/L — ABNORMAL HIGH (ref 135–145)
Sodium: 152 mmol/L — ABNORMAL HIGH (ref 135–145)
Sodium: 153 mmol/L — ABNORMAL HIGH (ref 135–145)
Sodium: 150 mmol/L — ABNORMAL HIGH (ref 135–145)
Sodium: 151 mmol/L — ABNORMAL HIGH (ref 135–145)

## 2018-01-17 LAB — IRON AND TIBC
Iron: 52 ug/dL (ref 28–170)
Saturation Ratios: 21 % (ref 10.4–31.8)
TIBC: 251 ug/dL (ref 250–450)
UIBC: 199 ug/dL

## 2018-01-17 LAB — NA AND K (SODIUM & POTASSIUM), RAND UR
POTASSIUM UR: 4 mmol/L
Sodium, Ur: 23 mmol/L

## 2018-01-17 LAB — FERRITIN: Ferritin: 279 ng/mL (ref 11–307)

## 2018-01-17 LAB — FOLATE: FOLATE: 37.8 ng/mL (ref >5.9)

## 2018-01-17 LAB — OSMOLALITY, URINE: Osmolality, Ur: 100 mOsm/kg — ABNORMAL LOW (ref 300–900)

## 2018-01-17 LAB — VITAMIN B12: Vitamin B-12: 417 pg/mL (ref 180–914)

## 2018-01-17 MED ORDER — HYDROCHLOROTHIAZIDE 12.5 MG PO CAPS: 12.5000 mg | ORAL_CAPSULE | Freq: Every day | ORAL | Status: DC

## 2018-01-17 MED ORDER — POTASSIUM CHLORIDE CRYS ER 20 MEQ PO TBCR
40.0000 meq | EXTENDED_RELEASE_TABLET | Freq: Once | ORAL | Status: AC
  Administered 2018-01-17: 40 meq via ORAL
  Filled 2018-01-17: qty 2

## 2018-01-17 MED ORDER — DEXTROSE 5 % IV SOLN
INTRAVENOUS | Status: DC
  Administered 2018-01-17 – 2018-01-21 (×10): via INTRAVENOUS

## 2018-01-17 NOTE — Care Management Important Message (Signed)
Important Message  Patient Details  Name: Bridget Jackson MRN: [id number] Date of Birth: Nov 06, 1950   Medicare Important Message Given:  Yes    Pollie Friar, RN 01/17/2018, 2:15 PM

## 2018-01-17 NOTE — Progress Notes (Signed)
I was contacted at 4:52 PM to see pt for hypernatremia perhaps c/w lithium related DI. Her sodium does in fact seem to be trending down.  I see no orders for free water repletion.  For now I have taken her off of her fluid restriction, told nursing to allow pt to drink what she was thirsty for and in addition I will give her some D5W as more free water.    I will do a formal consult 12/16   Louis Meckel
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
## 2018-01-18 LAB — BASIC METABOLIC PANEL
ANION GAP: 6 (ref 5–15)
Anion gap: 6 (ref 5–15)
Anion gap: 5 (ref 5–15)
Anion gap: 7 (ref 5–15)
BUN: 11 mg/dL (ref 8–23)
BUN: 10 mg/dL (ref 8–23)
BUN: 11 mg/dL (ref 8–23)
BUN: 11 mg/dL (ref 8–23)
CALCIUM: 8.9 mg/dL (ref 8.9–10.3)
CHLORIDE: 127 mmol/L — AB (ref 98–111)
CO2: 19 mmol/L — ABNORMAL LOW (ref 22–32)
CO2: 21 mmol/L — ABNORMAL LOW (ref 22–32)
CO2: 18 mmol/L — ABNORMAL LOW (ref 22–32)
CO2: 20 mmol/L — ABNORMAL LOW (ref 22–32)
CREATININE: 1.52 mg/dL — AB (ref 0.44–1.00)
Calcium: 8.6 mg/dL — ABNORMAL LOW (ref 8.9–10.3)
Calcium: 8.7 mg/dL — ABNORMAL LOW (ref 8.9–10.3)
Calcium: 8.9 mg/dL (ref 8.9–10.3)
Chloride: 126 mmol/L — ABNORMAL HIGH (ref 98–111)
Chloride: 129 mmol/L — ABNORMAL HIGH (ref 98–111)
Chloride: 124 mmol/L — ABNORMAL HIGH (ref 98–111)
Creatinine, Ser: 1.39 mg/dL — ABNORMAL HIGH (ref 0.44–1.00)
Creatinine, Ser: 1.58 mg/dL — ABNORMAL HIGH (ref 0.44–1.00)
Creatinine, Ser: 1.63 mg/dL — ABNORMAL HIGH (ref 0.44–1.00)
GFR calc Af Amer: 45 mL/min — ABNORMAL LOW (ref >60)
GFR calc Af Amer: 39 mL/min — ABNORMAL LOW (ref >60)
GFR calc Af Amer: 37 mL/min — ABNORMAL LOW (ref >60)
GFR calc Af Amer: 40 mL/min — ABNORMAL LOW (ref >60)
GFR calc non Af Amer: 39 mL/min — ABNORMAL LOW (ref >60)
GFR calc non Af Amer: 33 mL/min — ABNORMAL LOW (ref >60)
GFR calc non Af Amer: 32 mL/min — ABNORMAL LOW (ref >60)
GFR calc non Af Amer: 35 mL/min — ABNORMAL LOW (ref >60)
Glucose, Bld: 152 mg/dL — ABNORMAL HIGH (ref 70–99)
Glucose, Bld: 122 mg/dL — ABNORMAL HIGH (ref 70–99)
Glucose, Bld: 130 mg/dL — ABNORMAL HIGH (ref 70–99)
Glucose, Bld: 112 mg/dL — ABNORMAL HIGH (ref 70–99)
Potassium: 3.5 mmol/L (ref 3.5–5.1)
Potassium: 3.6 mmol/L (ref 3.5–5.1)
Potassium: 3.4 mmol/L — ABNORMAL LOW (ref 3.5–5.1)
Potassium: 3.5 mmol/L (ref 3.5–5.1)
SODIUM: 153 mmol/L — AB (ref 135–145)
Sodium: 152 mmol/L — ABNORMAL HIGH (ref 135–145)
Sodium: 152 mmol/L — ABNORMAL HIGH (ref 135–145)
Sodium: 151 mmol/L — ABNORMAL HIGH (ref 135–145)

## 2018-01-18 LAB — CULTURE, BLOOD (ROUTINE X 2)
Culture: NO GROWTH
Culture: NO GROWTH
Special Requests: ADEQUATE
Special Requests: ADEQUATE

## 2018-01-18 MED ORDER — LOPERAMIDE HCL 2 MG PO CAPS
4.0000 mg | ORAL_CAPSULE | Freq: Once | ORAL | Status: AC
  Administered 2018-01-18: 4 mg via ORAL
  Filled 2018-01-18: qty 2

## 2018-01-18 MED ORDER — AMOXICILLIN-POT CLAVULANATE 500-125 MG PO TABS
1.0000 | ORAL_TABLET | Freq: Three times a day (TID) | ORAL | Status: AC
  Administered 2018-01-18 – 2018-01-20 (×6): 500 mg via ORAL
  Filled 2018-01-18 (×6): qty 1
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
## 2018-01-18 NOTE — Care Management Note (Signed)
Case Management Note  Patient Details  Name: Bridget Jackson MRN: [id number] Date of Birth: [birth year]  Subjective/Objective:       Pt admitted with altered mental status. She is from [place].  DME: rollator, shower chair, bars in bathroom Pt denies issues obtaining her medications. Pt's sister or the [facility] provide transportation.            Action/Plan: Awaiting PT/OT evals. CM following for d/c disposition.  Expected Discharge Date:                  Expected Discharge Plan:     In-House Referral:     Discharge planning Services     Post Acute Care Choice:    Choice offered to:     DME Arranged:    DME Agency:     HH Arranged:    HH Agency:     Status of Service:  In process, will continue to follow  If discussed at Long Length of Stay Meetings, dates discussed:    Additional Comments:  Pollie Friar, RN 01/18/2018, 9:21 AM
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
## 2018-01-18 NOTE — NC FL2 (Signed)
[place] LEVEL OF CARE SCREENING TOOL     IDENTIFICATION  Patient Name: Bridget Jackson Birthdate: 03-10-1950 Sex: female Admission Date (Current Location): 01/13/2018  [facility] [place] Number:  Herbalist and Address:  [facility], [place] [address], [place], [place] [address]      Provider Number: [id number]  Attending Physician Name and Address:  Mariel Aloe, MD  Relative Name and Phone Number:       Current Level of Care: Hospital Recommended Level of Care: [place] Prior Approval Number:    Date Approved/Denied:   PASRR Number: Manual review  Discharge Plan: SNF    Current Diagnoses: Patient Active Problem List   Diagnosis Date Noted  . Altered mental status   . Hypotension   . 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 CPAP ventilation treatment not tolerated 11/17/2017  . Excessive daytime sleepiness 11/05/2017  . Severe sleep apnea 11/05/2017  . Left shoulder pain 08/31/2017  . Pyuria 08/30/2017  . Debility 08/30/2017  . Schizoaffective disorder ([place]) 08/30/2017  . Hyperlipidemia 08/30/2017  . Essential hypertension 08/30/2017    Orientation RESPIRATION BLADDER Height & Weight     Self, Time, Situation, Place  Normal Incontinent Weight: 207 lb 7.3 oz (94.1 kg) Height:  5\' 3"  (160 cm)  BEHAVIORAL SYMPTOMS/MOOD NEUROLOGICAL BOWEL NUTRITION STATUS      Continent Diet(see DC summary)  AMBULATORY STATUS COMMUNICATION OF NEEDS Skin   Extensive Assist Verbally Normal                       Personal Care Assistance Level of Assistance  Bathing, Feeding, Dressing Bathing Assistance: Maximum assistance Feeding assistance: Limited assistance Dressing Assistance: Maximum assistance     Functional Limitations Info  Hearing, Speech, Sight Sight Info: Adequate Hearing Info: Adequate Speech Info: Impaired(dysarthria)    SPECIAL CARE FACTORS FREQUENCY  PT (By licensed PT), OT (By  licensed OT)     PT Frequency: 5x/wk OT Frequency: 5x/wk            Contractures Contractures Info: Not present    Additional Factors Info  Code Status, Allergies, Psychotropic Code Status Info: DNR Allergies Info: Lipitor Atorvastatin Calcium, Provera Medroxyprogesterone Acetate, Prednisone Psychotropic Info: Lithium 300mg  2x/day         Current Medications (01/18/2018):  This is the current hospital active medication list Current Facility-Administered Medications  Medication Dose Route Frequency Provider Last Rate Last Dose  . 0.9 %  sodium chloride infusion  250 mL Intravenous Continuous Collene Gobble, MD 10 mL/hr at 01/18/18 0547 250 mL at 01/18/18 0547  . chlorhexidine (PERIDEX) 0.12 % solution 15 mL  15 mL Mouth Rinse BID Collene Gobble, MD   15 mL at 01/18/18 1026  . dextrose 5 % solution   Intravenous Continuous Corliss Parish, MD 100 mL/hr at 01/18/18 0547    . fluPHENAZine (PROLIXIN) tablet 4 mg  4 mg Oral QHS Collene Gobble, MD   4 mg at 01/17/18 2235  . lithium carbonate capsule 300 mg  300 mg Oral BID Collene Gobble, MD   300 mg at 01/18/18 1026  . MEDLINE mouth rinse  15 mL Mouth Rinse q12n4p Collene Gobble, MD   15 mL at 01/17/18 1258  . piperacillin-tazobactam (ZOSYN) IVPB 3.375 g  3.375 g Intravenous Q8H Collene Gobble, MD 12.5 mL/hr at 01/18/18 0548 3.375 g at 01/18/18 0548  . traZODone (DESYREL) tablet 50 mg  50 mg Oral QHS Collene Gobble, MD   50 mg at 01/17/18 2235     Discharge Medications: Please see discharge summary for a list of discharge medications.  Relevant Imaging Results:  Relevant Lab Results:   Additional Information SS#: [id number]  Geralynn Ochs, LCSW

## 2018-01-18 NOTE — Clinical Social Work Note (Signed)
Clinical Social Work Assessment  Patient Details  Name: Bridget Jackson MRN: [id number] Date of Birth: 1950/05/19  Date of referral:  01/18/18               Reason for consult:  Facility Placement                Permission sought to share information with:  Facility Sport and exercise psychologist, Family Supports Permission granted to share information::  Yes, Verbal Permission Granted  Name::     Advertising copywriter::  SNF  Relationship::  Sister  Contact Information:     Housing/Transportation Living arrangements for the past 2 months:  [place] of Information:  Medical Team, Siblings Patient Interpreter Needed:  None Criminal Activity/Legal Involvement Pertinent to Current Situation/Hospitalization:  No - Comment as needed Significant Relationships:  Siblings, Friend Lives with:  Self Do you feel safe going back to the place where you live?  Yes Need for family participation in patient care:  No (Coment)  Care giving concerns:  Patient from [facility] but will benefit from short term rehab at discharge. Patient's sister concerned about whether the patient will end up needing long term care, if she is unable to return to her independent living facility.   Social Worker assessment / plan:  CSW met with patient's sister in the hallway to discuss rehab options. Patient's sister says she's familiar with some of the options, and would prefer [facility], if possible. CSW discussed how [facility] has been full lately, so it will be important for the patient's sister to review other options. CSW provided facility list. Patient's sister discussed how she is thinking that the patient will need long term care and will not be able to return to her independent living facility, would like assistance with finding out if any SNF has long term beds available.  Employment status:  Retired Forensic scientist:  Medicare PT Recommendations:  [facility] / Referral to community resources:  [place]  Patient/Family's Response to care:  Patient's family agreeable for patient to go to SNF at discharge.  Patient/Family's Understanding of and Emotional Response to Diagnosis, Current Treatment, and Prognosis:  Patient and family seem to understand the impairments that the patient has at this time and that it's not safe for her to be on her own, as she needs more assistance. Patient's sister hopeful that the patient can get into a good facility with good care, as she's been searching for a good long term care placement for her for a little while.  Emotional Assessment Appearance:  Appears stated age Attitude/Demeanor/Rapport:  Engaged Affect (typically observed):    Orientation:  Oriented to Self, Oriented to Place, Oriented to  Time, Oriented to Situation Alcohol / Substance use:  Not Applicable Psych involvement (Current and /or in the community):  No (Comment)  Discharge Needs  Concerns to be addressed:  Care Coordination Readmission within the last 30 days:  No Current discharge risk:  Physical Impairment, Dependent with Mobility, Lives alone Barriers to Discharge:  Continued Medical Work up, Programmer, applications ([place])   Geralynn Ochs, LCSW 01/18/2018, 2:01 PM

## 2018-01-18 NOTE — Care Management Important Message (Signed)
Important Message  Patient Details  Name: Bridget Jackson MRN: [id number] Date of Birth: 1950-02-09   Medicare Important Message Given:  Yes    Ellijah Leffel [address] 01/18/2018, 3:44 PM

## 2018-01-19 DIAGNOSIS — Z789 Other specified health status: Secondary | ICD-10-CM

## 2018-01-19 DIAGNOSIS — R400 Somnolence: Secondary | ICD-10-CM

## 2018-01-19 LAB — C DIFFICILE QUICK SCREEN W PCR REFLEX
C Diff antigen: NEGATIVE
C Diff interpretation: NOT DETECTED
C Diff toxin: NEGATIVE

## 2018-01-19 LAB — BASIC METABOLIC PANEL
Anion gap: 7 (ref 5–15)
Anion gap: 5 (ref 5–15)
BUN: 10 mg/dL (ref 8–23)
BUN: 11 mg/dL (ref 8–23)
CALCIUM: 8.5 mg/dL — AB (ref 8.9–10.3)
CO2: 19 mmol/L — ABNORMAL LOW (ref 22–32)
CO2: 20 mmol/L — ABNORMAL LOW (ref 22–32)
Calcium: 8.6 mg/dL — ABNORMAL LOW (ref 8.9–10.3)
Chloride: 122 mmol/L — ABNORMAL HIGH (ref 98–111)
Chloride: 123 mmol/L — ABNORMAL HIGH (ref 98–111)
Creatinine, Ser: 1.49 mg/dL — ABNORMAL HIGH (ref 0.44–1.00)
Creatinine, Ser: 1.44 mg/dL — ABNORMAL HIGH (ref 0.44–1.00)
GFR calc Af Amer: 43 mL/min — ABNORMAL LOW (ref >60)
GFR calc non Af Amer: 36 mL/min — ABNORMAL LOW (ref >60)
GFR calc non Af Amer: 37 mL/min — ABNORMAL LOW (ref >60)
GFR, EST AFRICAN AMERICAN: 41 mL/min — AB (ref >60)
Glucose, Bld: 121 mg/dL — ABNORMAL HIGH (ref 70–99)
Glucose, Bld: 112 mg/dL — ABNORMAL HIGH (ref 70–99)
Potassium: 3.2 mmol/L — ABNORMAL LOW (ref 3.5–5.1)
Potassium: 3.6 mmol/L (ref 3.5–5.1)
SODIUM: 148 mmol/L — AB (ref 135–145)
Sodium: 148 mmol/L — ABNORMAL HIGH (ref 135–145)

## 2018-01-19 LAB — LITHIUM LEVEL: Lithium Lvl: 0.92 mmol/L (ref 0.60–1.20)

## 2018-01-19 MED ORDER — LOPERAMIDE HCL 2 MG PO CAPS
4.0000 mg | ORAL_CAPSULE | Freq: Once | ORAL | Status: AC
  Administered 2018-01-19: 4 mg via ORAL
  Filled 2018-01-19: qty 2

## 2018-01-19 MED ORDER — LOPERAMIDE HCL 2 MG PO CAPS
2.0000 mg | ORAL_CAPSULE | Freq: Three times a day (TID) | ORAL | Status: DC | PRN
  Administered 2018-01-20 (×2): 2 mg via ORAL
  Filled 2018-01-19 (×2): qty 1

## 2018-01-19 MED ORDER — POTASSIUM CHLORIDE CRYS ER 20 MEQ PO TBCR
40.0000 meq | EXTENDED_RELEASE_TABLET | Freq: Once | ORAL | Status: DC
  Filled 2018-01-19: qty 2

## 2018-01-19 MED ORDER — POTASSIUM CHLORIDE CRYS ER 20 MEQ PO TBCR
40.0000 meq | EXTENDED_RELEASE_TABLET | Freq: Once | ORAL | Status: AC
  Administered 2018-01-19: 40 meq via ORAL
  Filled 2018-01-19: qty 2

## 2018-01-19 NOTE — Progress Notes (Signed)
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 person covering after 7pm

## 2018-01-19 NOTE — Progress Notes (Signed)
[facility] Progress Note  Name: Bridget Jackson MRN: [id number] DOB: 02/03/1950  Chief Complaint/reason for consult:  Found down/Hypernatremia   Subjective:  She has been drinking water and has had D5 at 150 cc/hr infusing - tolerating well.   Review of systems:  Denies chest pain or shortness of breath  Denies nausea or vomiting Reports loose stool - she is being checked for c dif and now on isolation    Intake/Output Summary (Last 24 hours) at 01/19/2018 1112 Last data filed at 01/18/2018 2100 Gross per 24 hour  Intake 1699.29 ml  Output 1800 ml  Net -100.71 ml    Vitals:  Vitals:   01/18/18 2015 01/18/18 2339 01/19/18 0444 01/19/18 0807  BP: (!) 97/56 108/66 (!) 89/58 (!) 91/53  Pulse: 71 78 70 73  Resp: 18 20 18 16   Temp: (!) 97.3 F (36.3 C) (!) 97.5 F (36.4 C) 97.9 F (36.6 C) 98.2 F (36.8 C)  TempSrc: Oral Oral Oral Oral  SpO2: 99% 99% 100% 100%  Weight:      Height:         Physical Exam:  General adult female in bed in no acute distress HEENT normocephalic atraumatic extraocular movements intact sclera anicteric Neck supple trachea midline Lungs clear to auscultation bilaterally normal work of breathing at rest  Heart regular rate and rhythm no rubs or gallops appreciated Abdomen soft nontender distended/obese habitus Extremities no lower extremity edema Psych normal mood and affect   Labs:  BMP Latest Ref Rng & Units 01/19/2018 01/19/2018 01/18/2018  Glucose 70 - 99 mg/dL 112(H) 121(H) 112(H)  BUN 8 - 23 mg/dL 11 10 11   Creatinine 0.44 - 1.00 mg/dL 1.44(H) 1.49(H) 1.52(H)  Sodium 135 - 145 mmol/L 148(H) 148(H) 151(H)  Potassium 3.5 - 5.1 mmol/L 3.6 3.2(L) 3.5  Chloride 98 - 111 mmol/L 123(H) 122(H) 124(H)  CO2 22 - 32 mmol/L 20(L) 19(L) 20(L)  Calcium 8.9 - 10.3 mg/dL 8.5(L) 8.6(L) 8.9   CBC    Component Value Date/Time   WBC 11.2 (H) 01/15/2018 0228   RBC 3.22 (L) 01/17/2018 0403   RBC 3.36 (L) 01/15/2018 0228   HGB  10.3 (L) 01/15/2018 0228   HCT 33.7 (L) 01/15/2018 0228   PLT 255 01/15/2018 0228   MCV 100.3 (H) 01/15/2018 0228   MCH 30.7 01/15/2018 0228   MCHC 30.6 01/15/2018 0228   RDW 15.8 (H) 01/15/2018 0228   LYMPHSABS 1.0 01/15/2018 0228   MONOABS 1.0 01/15/2018 0228   EOSABS 0.1 01/15/2018 0228   BASOSABS 0.0 01/15/2018 0228      Assessment/Plan:  67 year old white female with schizoaffective disorder on longstanding lithium with hypernatremia and CKD  1. 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  She also had some acute on chronic kidney disease in the setting of her hypotension which is improved  2.  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  Improving.  Patient has a significant free water deficit.  Encouraged free water intake ad lib.  She has continued on D5W and tolerating well.  Sodium acceptable prior to hospitalization - she likely had impaired ability to recognize thirst with AMS prior to admission.   3. Hypotension - resolved   4. Macrocytic Anemia - mild and stable on last check  5.  Hypokalemia- s/p repletion   6.  Schizoaffective disorder - defer regimen to primary team.  Presently on lithium   Claudia Desanctis, MD @DATE @ 11:12 AM

## 2018-01-20 LAB — BASIC METABOLIC PANEL
Anion gap: 9 (ref 5–15)
BUN: 11 mg/dL (ref 8–23)
CO2: 17 mmol/L — ABNORMAL LOW (ref 22–32)
CREATININE: 1.39 mg/dL — AB (ref 0.44–1.00)
Calcium: 8.7 mg/dL — ABNORMAL LOW (ref 8.9–10.3)
Chloride: 125 mmol/L — ABNORMAL HIGH (ref 98–111)
GFR calc Af Amer: 45 mL/min — ABNORMAL LOW (ref >60)
GFR, EST NON AFRICAN AMERICAN: 39 mL/min — AB (ref >60)
Glucose, Bld: 119 mg/dL — ABNORMAL HIGH (ref 70–99)
Potassium: 4 mmol/L (ref 3.5–5.1)
Sodium: 151 mmol/L — ABNORMAL HIGH (ref 135–145)

## 2018-01-20 MED ORDER — AMILORIDE HCL 5 MG PO TABS
10.0000 mg | ORAL_TABLET | Freq: Every day | ORAL | Status: DC
  Administered 2018-01-20 – 2018-01-21 (×2): 10 mg via ORAL
  Filled 2018-01-20 (×3): qty 2

## 2018-01-20 NOTE — Progress Notes (Signed)
[facility] Progress Note  Name: Bridget Jackson MRN: [id number] DOB: October 20, 1950  Chief Complaint/reason for consult:  Found down/Hypernatremia   Subjective:  BP has been low overnight.  She had some diarrhea so that is depleting her water.  She has been drinking water but not sure enough and has had D5 at 150 cc/hr infusing -No UOP recorded.  Sister is here today - does not want her taken off of lithium   Review of systems:  Reports loose stool - she is being checked for c dif and now on isolation - is better    Intake/Output Summary (Last 24 hours) at 01/20/2018 0752 Last data filed at 01/20/2018 0500 Gross per 24 hour  Intake 2918.19 ml  Output -  Net 2918.19 ml    Vitals:  Vitals:   01/19/18 2019 01/19/18 2359 01/20/18 0411 01/20/18 0428  BP: (!) 89/56 (!) 83/55  (!) 91/53  Pulse: 70 70  70  Resp: 15 14  12   Temp: 97.8 F (36.6 C) 97.6 F (36.4 C)  97.6 F (36.4 C)  TempSrc: Oral Oral  Oral  SpO2:  100%  100%  Weight:   90.9 kg   Height:         Physical Exam:  General adult female in bed in no acute distress HEENT normocephalic atraumatic extraocular movements intact sclera anicteric Neck supple trachea midline Lungs clear to auscultation bilaterally normal work of breathing at rest  Heart regular rate and rhythm no rubs or gallops appreciated Abdomen soft nontender distended/obese habitus Extremities no lower extremity edema Psych normal mood and affect   Labs:  BMP Latest Ref Rng & Units 01/20/2018 01/19/2018 01/19/2018  Glucose 70 - 99 mg/dL 119(H) 112(H) 121(H)  BUN 8 - 23 mg/dL 11 11 10   Creatinine 0.44 - 1.00 mg/dL 1.39(H) 1.44(H) 1.49(H)  Sodium 135 - 145 mmol/L 151(H) 148(H) 148(H)  Potassium 3.5 - 5.1 mmol/L 4.0 3.6 3.2(L)  Chloride 98 - 111 mmol/L 125(H) 123(H) 122(H)  CO2 22 - 32 mmol/L 17(L) 20(L) 19(L)  Calcium 8.9 - 10.3 mg/dL 8.7(L) 8.5(L) 8.6(L)   CBC    Component Value Date/Time   WBC 11.2 (H) 01/15/2018 0228   RBC  3.22 (L) 01/17/2018 0403   RBC 3.36 (L) 01/15/2018 0228   HGB 10.3 (L) 01/15/2018 0228   HCT 33.7 (L) 01/15/2018 0228   PLT 255 01/15/2018 0228   MCV 100.3 (H) 01/15/2018 0228   MCH 30.7 01/15/2018 0228   MCHC 30.6 01/15/2018 0228   RDW 15.8 (H) 01/15/2018 0228   LYMPHSABS 1.0 01/15/2018 0228   MONOABS 1.0 01/15/2018 0228   EOSABS 0.1 01/15/2018 0228   BASOSABS 0.0 01/15/2018 0228      Assessment/Plan:  67 year old white female with schizoaffective disorder on longstanding lithium with hypernatremia and CKD  1. 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  She also had some acute on chronic kidney disease in the setting of her hypotension which is improved  2.  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  Was improving, now slightly worse today.  Patient has a significant free water deficit.  I feel she just doesn't have the access to water that she does as an OP and that is issue.  Sister will help get her water - encouraged free water intake ad lib.  She has continued on D5W- will inc rate and tolerating well.  Sodium acceptable prior to hospitalization - she likely had impaired ability to recognize thirst with  AMS prior to admission.   3. Hypotension - has recurred   4. Macrocytic Anemia - mild and stable on last check  5.  Hypokalemia- s/p repletion   6.  Schizoaffective disorder - defer regimen to primary team.  Presently on lithium- does not want to stop and I dont think is necessary   Louis Meckel, MD @DATE @ 7:52 AM

## 2018-01-20 NOTE — Progress Notes (Signed)
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   APPEARANCEUR HAZY (A) 01/13/2018 1354   LABSPEC 1.015 01/13/2018 1354   PHURINE 5.0 01/13/2018 1354   GLUCOSEU NEGATIVE 01/13/2018 1354   HGBUR MODERATE (A) 01/13/2018 1354   BILIRUBINUR NEGATIVE 01/13/2018 1354   KETONESUR NEGATIVE 01/13/2018 1354   PROTEINUR NEGATIVE 01/13/2018 1354   NITRITE NEGATIVE 01/13/2018 1354   LEUKOCYTESUR NEGATIVE 01/13/2018 1354     Mariyam Remington M.D. [facility] 01/20/2018, 12:06 PM  Pager: [phone] Between 7am to 7pm - call Pager - [phone]  After 7pm go to [email or web address] - password [email or web address]  Call night coverage person covering after 7pm
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
## 2018-01-21 LAB — BASIC METABOLIC PANEL
ANION GAP: 6 (ref 5–15)
BUN: 14 mg/dL (ref 8–23)
CO2: 19 mmol/L — ABNORMAL LOW (ref 22–32)
Calcium: 8.5 mg/dL — ABNORMAL LOW (ref 8.9–10.3)
Chloride: 118 mmol/L — ABNORMAL HIGH (ref 98–111)
Creatinine, Ser: 1.39 mg/dL — ABNORMAL HIGH (ref 0.44–1.00)
GFR calc Af Amer: 45 mL/min — ABNORMAL LOW (ref >60)
GFR calc non Af Amer: 39 mL/min — ABNORMAL LOW (ref >60)
GLUCOSE: 113 mg/dL — AB (ref 70–99)
Potassium: 4 mmol/L (ref 3.5–5.1)
Sodium: 143 mmol/L (ref 135–145)

## 2018-01-21 NOTE — Progress Notes (Signed)
Patient and daughter have selected [facility] for SNF placement. Facility is able to accept patient once medically stable- informed facility to anticipate discharge on Saturday 12/21.   Kingsley Spittle, [place]  [phone]

## 2018-01-21 NOTE — Progress Notes (Signed)
[facility] Progress Note  Name: Bridget Jackson MRN: [id number] DOB: 05/04/1950  Chief Complaint/reason for consult:  Found down/Hypernatremia   Subjective:  BP seemed better until early this AM.  She says diarrhea is better.  Added amiloride yesterday- sodium down to 143 this AM.  Had 5 liters of UOP recorded - many drinks at bedside  Review of systems:  Reports loose stool -- is better, no other c/o's    Intake/Output Summary (Last 24 hours) at 01/21/2018 0910 Last data filed at 01/21/2018 0600 Gross per 24 hour  Intake 3573.33 ml  Output 4450 ml  Net -876.67 ml    Vitals:  Vitals:   01/20/18 2003 01/20/18 2343 01/21/18 0330 01/21/18 0802  BP: (!) 99/58 (!) 103/48 (!) 102/55 (!) 86/49  Pulse: 77 67 66 68  Resp:  18 18 18   Temp:  98.5 F (36.9 C) 97.8 F (36.6 C) 97.8 F (36.6 C)  TempSrc:  Oral Oral Oral  SpO2:  98% 98% 100%  Weight:   90.5 kg   Height:         Physical Exam:  General adult female in bed in no acute distress HEENT normocephalic atraumatic extraocular movements intact sclera anicteric- has black eyes Neck supple trachea midline Lungs clear to auscultation bilaterally normal work of breathing at rest  Heart regular rate and rhythm no rubs or gallops appreciated Abdomen soft nontender distended/obese habitus Extremities no lower extremity edema Psych flat   Labs:  BMP Latest Ref Rng & Units 01/21/2018 01/20/2018 01/19/2018  Glucose 70 - 99 mg/dL 113(H) 119(H) 112(H)  BUN 8 - 23 mg/dL 14 11 11   Creatinine 0.44 - 1.00 mg/dL 1.39(H) 1.39(H) 1.44(H)  Sodium 135 - 145 mmol/L 143 151(H) 148(H)  Potassium 3.5 - 5.1 mmol/L 4.0 4.0 3.6  Chloride 98 - 111 mmol/L 118(H) 125(H) 123(H)  CO2 22 - 32 mmol/L 19(L) 17(L) 20(L)  Calcium 8.9 - 10.3 mg/dL 8.5(L) 8.7(L) 8.5(L)   CBC    Component Value Date/Time   WBC 11.2 (H) 01/15/2018 0228   RBC 3.22 (L) 01/17/2018 0403   RBC 3.36 (L) 01/15/2018 0228   HGB 10.3 (L) 01/15/2018 0228   HCT 33.7 (L) 01/15/2018 0228   PLT 255 01/15/2018 0228   MCV 100.3 (H) 01/15/2018 0228   MCH 30.7 01/15/2018 0228   MCHC 30.6 01/15/2018 0228   RDW 15.8 (H) 01/15/2018 0228   LYMPHSABS 1.0 01/15/2018 0228   MONOABS 1.0 01/15/2018 0228   EOSABS 0.1 01/15/2018 0228   BASOSABS 0.0 01/15/2018 0228      Assessment/Plan:  67 year old white female with schizoaffective disorder on longstanding lithium with hypernatremia and CKD  1. 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  She also had some acute on chronic kidney disease in the setting of her hypotension which is improved- crt stable  2.  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  Was improving, then worse, now much better today.  Amiloride added.   encouraged free water intake ad lib.    Sodium acceptable prior to hospitalization - she likely had impaired ability to recognize thirst with AMS. Hopefully better now. We need to make sure that she can maintain sodium off of 2.4 liters of IVF daily.  Stop IVF and if sodium is OK tomorrow can be discharged   3. Hypotension - has recurred   4. Macrocytic Anemia - mild and stable on last check  5.  Hypokalemia- s/p repletion - stable   6.  Schizoaffective disorder - defer regimen to primary team.  Presently on lithium- does not want to stop and I dont think is necessary   Louis Meckel, MD @DATE @ 9:10 AM

## 2018-01-21 NOTE — Progress Notes (Signed)
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 01/13/2018 1354   GLUCOSEU NEGATIVE 01/13/2018 1354   HGBUR MODERATE (A) 01/13/2018 1354   BILIRUBINUR NEGATIVE 01/13/2018 1354   KETONESUR NEGATIVE 01/13/2018 1354   PROTEINUR NEGATIVE 01/13/2018 1354   NITRITE NEGATIVE 01/13/2018 1354   LEUKOCYTESUR NEGATIVE 01/13/2018 1354     Hibo Blasdell M.D. [facility] 01/21/2018, 11:43 AM  Pager: [phone] Between 7am to 7pm - call Pager - [phone]  After 7pm go to [email or web address] - password [email or web address]  Call night coverage person covering after 7pm
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
## 2018-01-22 LAB — BASIC METABOLIC PANEL
Anion gap: 7 (ref 5–15)
BUN: 14 mg/dL (ref 8–23)
CO2: 21 mmol/L — ABNORMAL LOW (ref 22–32)
Calcium: 9.1 mg/dL (ref 8.9–10.3)
Chloride: 123 mmol/L — ABNORMAL HIGH (ref 98–111)
Creatinine, Ser: 1.37 mg/dL — ABNORMAL HIGH (ref 0.44–1.00)
GFR calc Af Amer: 46 mL/min — ABNORMAL LOW (ref >60)
GFR, EST NON AFRICAN AMERICAN: 40 mL/min — AB (ref >60)
Glucose, Bld: 104 mg/dL — ABNORMAL HIGH (ref 70–99)
POTASSIUM: 4.5 mmol/L (ref 3.5–5.1)
Sodium: 151 mmol/L — ABNORMAL HIGH (ref 135–145)

## 2018-01-22 MED ORDER — AMILORIDE HCL 5 MG PO TABS
20.0000 mg | ORAL_TABLET | Freq: Every day | ORAL | Status: DC
  Administered 2018-01-22 – 2018-01-24 (×3): 20 mg via ORAL
  Filled 2018-01-22 (×3): qty 4

## 2018-01-22 MED ORDER — DEXTROSE 5 % IV SOLN
INTRAVENOUS | Status: DC
  Administered 2018-01-22 (×2): via INTRAVENOUS

## 2018-01-22 NOTE — Progress Notes (Signed)
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   BILIRUBINUR NEGATIVE 01/13/2018 1354   KETONESUR NEGATIVE 01/13/2018 1354   PROTEINUR NEGATIVE 01/13/2018 1354   NITRITE NEGATIVE 01/13/2018 1354   LEUKOCYTESUR NEGATIVE 01/13/2018 1354     Pernie Grosso M.D. [facility] 01/22/2018, 12:25 PM  Pager: [phone] Between 7am to 7pm - call Pager - [phone]  After 7pm go to [email or web address] - password [email or web address]  Call night coverage person covering after 7pm

## 2018-01-22 NOTE — Progress Notes (Signed)
01/22/18:  CSW spoke with Dr. Tana Coast, patient's sodium levels are elevated. Patient will not be ready for discharge until medically cleared.   Continue to follow until patient is medically stable. Once patient is stable, follow up with [facility].   Domenic Schwab, MSW, [place]

## 2018-01-22 NOTE — Progress Notes (Signed)
[facility] Progress Note  Name: Bridget Jackson MRN: [id number] DOB: [birth year]  Chief Complaint/reason for consult:  Found down/Hypernatremia   Subjective:  BP seems OK  She says diarrhea is better.  Unfortunately sodium rebounded from 143 to 151 after d5w stopped    Intake/Output Summary (Last 24 hours) at 01/22/2018 0819 Last data filed at 01/22/2018 0700 Gross per 24 hour  Intake 480 ml  Output 6050 ml  Net -5570 ml    Vitals:  Vitals:   01/21/18 2335 01/22/18 0124 01/22/18 0407 01/22/18 0716  BP: (!) 91/44 (!) 111/52 (!) 109/59 (!) 92/49  Pulse: 72 76 70 72  Resp: 18 18 18 18   Temp: 99 F (37.2 C) 99.2 F (37.3 C) 98.7 F (37.1 C) 98.4 F (36.9 C)  TempSrc: Oral Oral Oral Oral  SpO2: 99% 100% 98% 97%  Weight:      Height:         Physical Exam:  General adult female in bed in no acute distress HEENT normocephalic atraumatic extraocular movements intact sclera anicteric- has black eyes Neck supple trachea midline Lungs clear to auscultation bilaterally normal work of breathing at rest  Heart regular rate and rhythm no rubs or gallops appreciated Abdomen soft nontender distended/obese habitus Extremities no lower extremity edema Psych flat   Labs:  BMP Latest Ref Rng & Units 01/22/2018 01/21/2018 01/20/2018  Glucose 70 - 99 mg/dL 104(H) 113(H) 119(H)  BUN 8 - 23 mg/dL 14 14 11   Creatinine 0.44 - 1.00 mg/dL 1.37(H) 1.39(H) 1.39(H)  Sodium 135 - 145 mmol/L 151(H) 143 151(H)  Potassium 3.5 - 5.1 mmol/L 4.5 4.0 4.0  Chloride 98 - 111 mmol/L 123(H) 118(H) 125(H)  CO2 22 - 32 mmol/L 21(L) 19(L) 17(L)  Calcium 8.9 - 10.3 mg/dL 9.1 8.5(L) 8.7(L)   CBC    Component Value Date/Time   WBC 11.2 (H) 01/15/2018 0228   RBC 3.22 (L) 01/17/2018 0403   RBC 3.36 (L) 01/15/2018 0228   HGB 10.3 (L) 01/15/2018 0228   HCT 33.7 (L) 01/15/2018 0228   PLT 255 01/15/2018 0228   MCV 100.3 (H) 01/15/2018 0228   MCH 30.7 01/15/2018 0228   MCHC 30.6  01/15/2018 0228   RDW 15.8 (H) 01/15/2018 0228   LYMPHSABS 1.0 01/15/2018 0228   MONOABS 1.0 01/15/2018 0228   EOSABS 0.1 01/15/2018 0228   BASOSABS 0.0 01/15/2018 0228      Assessment/Plan:  67 year old white female with schizoaffective disorder on longstanding lithium with hypernatremia and CKD  1. 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  She also had some acute on chronic kidney disease in the setting of her hypotension which is improved- crt stable  2.  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  Was improving, then worse,  much better yesterday, now worse today.  Amiloride added 12/19- will increase dose to max and need to resume d5w at 100 per hour.   encouraged free water intake ad lib.    Sodium acceptable prior to hospitalization - she likely had impaired ability to recognize thirst with AMS. Hopefully better now. Hesitant to add NSAID as the potential for AKI  3. Hypotension - BP soft   4. Macrocytic Anemia - mild and stable on last check  5.  Hypokalemia- s/p repletion - stable   6.  Schizoaffective disorder - defer regimen to primary team.  Presently on lithium- does not want to stop and I dont think is necessary  Louis Meckel, MD @DATE @ 8:19 AM

## 2018-01-23 LAB — BASIC METABOLIC PANEL
Anion gap: 9 (ref 5–15)
BUN: 14 mg/dL (ref 8–23)
CO2: 21 mmol/L — ABNORMAL LOW (ref 22–32)
Calcium: 8.9 mg/dL (ref 8.9–10.3)
Chloride: 116 mmol/L — ABNORMAL HIGH (ref 98–111)
Creatinine, Ser: 1.35 mg/dL — ABNORMAL HIGH (ref 0.44–1.00)
GFR calc Af Amer: 47 mL/min — ABNORMAL LOW (ref >60)
GFR calc non Af Amer: 40 mL/min — ABNORMAL LOW (ref >60)
Glucose, Bld: 105 mg/dL — ABNORMAL HIGH (ref 70–99)
Potassium: 4.7 mmol/L (ref 3.5–5.1)
Sodium: 146 mmol/L — ABNORMAL HIGH (ref 135–145)

## 2018-01-23 NOTE — Progress Notes (Signed)
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 [phone]  After 7pm go to [email or web address] - password [email or web address]  Call night coverage person covering after 7pm

## 2018-01-23 NOTE — Progress Notes (Signed)
[facility] Progress Note  Name: Bridget Jackson MRN: [id number] DOB: Jan 18, 1951  Chief Complaint/reason for consult:  Found down/Hypernatremia   Subjective:  BP seems OK - She says diarrhea is better.  sodium improved  from 151 to 146 after d5w resumed only at 100 per hour   Intake/Output Summary (Last 24 hours) at 01/23/2018 0833 Last data filed at 01/23/2018 0500 Gross per 24 hour  Intake 1356.14 ml  Output 2900 ml  Net -1543.86 ml    Vitals:  Vitals:   01/23/18 0010 01/23/18 0354 01/23/18 0500 01/23/18 0737  BP: (!) 99/52 (!) 109/58  (!) 106/57  Pulse: 68 67  68  Resp: 18 18  18   Temp: 97.8 F (36.6 C) 98.6 F (37 C)    TempSrc: Oral Oral    SpO2: 100% 98%  98%  Weight:   87.5 kg   Height:         Physical Exam:  General adult female in bed in no acute distress HEENT normocephalic atraumatic extraocular movements intact sclera anicteric- has black eyes Neck supple trachea midline Lungs clear to auscultation bilaterally normal work of breathing at rest  Heart regular rate and rhythm no rubs or gallops appreciated Abdomen soft nontender distended/obese habitus Extremities no lower extremity edema Psych flat   Labs:  BMP Latest Ref Rng & Units 01/23/2018 01/22/2018 01/21/2018  Glucose 70 - 99 mg/dL 105(H) 104(H) 113(H)  BUN 8 - 23 mg/dL 14 14 14   Creatinine 0.44 - 1.00 mg/dL 1.35(H) 1.37(H) 1.39(H)  Sodium 135 - 145 mmol/L 146(H) 151(H) 143  Potassium 3.5 - 5.1 mmol/L 4.7 4.5 4.0  Chloride 98 - 111 mmol/L 116(H) 123(H) 118(H)  CO2 22 - 32 mmol/L 21(L) 21(L) 19(L)  Calcium 8.9 - 10.3 mg/dL 8.9 9.1 8.5(L)   CBC    Component Value Date/Time   WBC 11.2 (H) 01/15/2018 0228   RBC 3.22 (L) 01/17/2018 0403   RBC 3.36 (L) 01/15/2018 0228   HGB 10.3 (L) 01/15/2018 0228   HCT 33.7 (L) 01/15/2018 0228   PLT 255 01/15/2018 0228   MCV 100.3 (H) 01/15/2018 0228   MCH 30.7 01/15/2018 0228   MCHC 30.6 01/15/2018 0228   RDW 15.8 (H) 01/15/2018 0228    LYMPHSABS 1.0 01/15/2018 0228   MONOABS 1.0 01/15/2018 0228   EOSABS 0.1 01/15/2018 0228   BASOSABS 0.0 01/15/2018 0228      Assessment/Plan:  67 year old white female with schizoaffective disorder on longstanding lithium with hypernatremia and CKD  1. 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  She also had some acute on chronic kidney disease in the setting of her hypotension which is improved- crt stable  2.  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  Was improving, then worse,  then better,  then worse, then better today again.  Amiloride added 12/19- increased dose to max on 12/21 and need to resume d5w at 100 per hour.   encouraged free water intake ad lib.    Sodium acceptable prior to hospitalization - will stop IVF again since sodium better.  I honestly feel that if pt discharged and has thirst mechanism intact which she seems to have- she will be able to maintain sodium as OP as she always has - would just need to be monitored.  I would suggest a BMP 2 times a month at first    3. Hypotension - BP soft   4. Macrocytic Anemia - mild and  stable on last check  5.  Hypokalemia- s/p repletion - stable   6.  Schizoaffective disorder -Presently on lithium- does not want to stop and I dont think is necessary   Louis Meckel, MD @DATE @ 8:33 AM

## 2018-01-24 DIAGNOSIS — R1312 Dysphagia, oropharyngeal phase: Secondary | ICD-10-CM | POA: Diagnosis not present

## 2018-01-24 DIAGNOSIS — R278 Other lack of coordination: Secondary | ICD-10-CM | POA: Diagnosis not present

## 2018-01-24 DIAGNOSIS — N189 Chronic kidney disease, unspecified: Secondary | ICD-10-CM | POA: Diagnosis not present

## 2018-01-24 DIAGNOSIS — I959 Hypotension, unspecified: Secondary | ICD-10-CM | POA: Diagnosis not present

## 2018-01-24 DIAGNOSIS — E785 Hyperlipidemia, unspecified: Secondary | ICD-10-CM | POA: Diagnosis not present

## 2018-01-24 DIAGNOSIS — I129 Hypertensive chronic kidney disease with stage 1 through stage 4 chronic kidney disease, or unspecified chronic kidney disease: Secondary | ICD-10-CM | POA: Diagnosis not present

## 2018-01-24 DIAGNOSIS — E875 Hyperkalemia: Secondary | ICD-10-CM | POA: Diagnosis not present

## 2018-01-24 DIAGNOSIS — I10 Essential (primary) hypertension: Secondary | ICD-10-CM | POA: Diagnosis not present

## 2018-01-24 DIAGNOSIS — E870 Hyperosmolality and hypernatremia: Secondary | ICD-10-CM | POA: Diagnosis not present

## 2018-01-24 DIAGNOSIS — R2689 Other abnormalities of gait and mobility: Secondary | ICD-10-CM | POA: Diagnosis not present

## 2018-01-24 DIAGNOSIS — N183 Chronic kidney disease, stage 3 (moderate): Secondary | ICD-10-CM | POA: Diagnosis not present

## 2018-01-24 DIAGNOSIS — R400 Somnolence: Secondary | ICD-10-CM | POA: Diagnosis not present

## 2018-01-24 DIAGNOSIS — G9341 Metabolic encephalopathy: Secondary | ICD-10-CM | POA: Diagnosis not present

## 2018-01-24 DIAGNOSIS — J690 Pneumonitis due to inhalation of food and vomit: Secondary | ICD-10-CM | POA: Diagnosis not present

## 2018-01-24 DIAGNOSIS — F331 Major depressive disorder, recurrent, moderate: Secondary | ICD-10-CM | POA: Diagnosis not present

## 2018-01-24 DIAGNOSIS — M2550 Pain in unspecified joint: Secondary | ICD-10-CM | POA: Diagnosis not present

## 2018-01-24 DIAGNOSIS — G4731 Primary central sleep apnea: Secondary | ICD-10-CM | POA: Diagnosis not present

## 2018-01-24 DIAGNOSIS — Z789 Other specified health status: Secondary | ICD-10-CM | POA: Diagnosis not present

## 2018-01-24 DIAGNOSIS — Z7401 Bed confinement status: Secondary | ICD-10-CM | POA: Diagnosis not present

## 2018-01-24 DIAGNOSIS — R41841 Cognitive communication deficit: Secondary | ICD-10-CM | POA: Diagnosis not present

## 2018-01-24 DIAGNOSIS — G4733 Obstructive sleep apnea (adult) (pediatric): Secondary | ICD-10-CM | POA: Diagnosis not present

## 2018-01-24 DIAGNOSIS — E232 Diabetes insipidus: Secondary | ICD-10-CM | POA: Diagnosis not present

## 2018-01-24 DIAGNOSIS — L859 Epidermal thickening, unspecified: Secondary | ICD-10-CM | POA: Diagnosis not present

## 2018-01-24 DIAGNOSIS — R4182 Altered mental status, unspecified: Secondary | ICD-10-CM | POA: Diagnosis not present

## 2018-01-24 DIAGNOSIS — E876 Hypokalemia: Secondary | ICD-10-CM | POA: Diagnosis not present

## 2018-01-24 DIAGNOSIS — N179 Acute kidney failure, unspecified: Secondary | ICD-10-CM | POA: Diagnosis not present

## 2018-01-24 DIAGNOSIS — R5381 Other malaise: Secondary | ICD-10-CM | POA: Diagnosis not present

## 2018-01-24 DIAGNOSIS — F259 Schizoaffective disorder, unspecified: Secondary | ICD-10-CM | POA: Diagnosis not present

## 2018-01-24 DIAGNOSIS — E871 Hypo-osmolality and hyponatremia: Secondary | ICD-10-CM | POA: Diagnosis not present

## 2018-01-24 LAB — CBC
HCT: 34.2 % — ABNORMAL LOW (ref 36.0–46.0)
Hemoglobin: 10.5 g/dL — ABNORMAL LOW (ref 12.0–15.0)
MCH: 30.5 pg (ref 26.0–34.0)
MCHC: 30.7 g/dL (ref 30.0–36.0)
MCV: 99.4 fL (ref 80.0–100.0)
Platelets: 307 10*3/uL (ref 150–400)
RBC: 3.44 MIL/uL — ABNORMAL LOW (ref 3.87–5.11)
RDW: 15.6 % — ABNORMAL HIGH (ref 11.5–15.5)
WBC: 10.2 10*3/uL (ref 4.0–10.5)
nRBC: 0 % (ref 0.0–0.2)

## 2018-01-24 LAB — BASIC METABOLIC PANEL
Anion gap: 9 (ref 5–15)
BUN: 17 mg/dL (ref 8–23)
CO2: 21 mmol/L — ABNORMAL LOW (ref 22–32)
Calcium: 9.1 mg/dL (ref 8.9–10.3)
Chloride: 114 mmol/L — ABNORMAL HIGH (ref 98–111)
Creatinine, Ser: 1.33 mg/dL — ABNORMAL HIGH (ref 0.44–1.00)
GFR calc Af Amer: 47 mL/min — ABNORMAL LOW (ref >60)
GFR, EST NON AFRICAN AMERICAN: 41 mL/min — AB (ref >60)
Glucose, Bld: 104 mg/dL — ABNORMAL HIGH (ref 70–99)
POTASSIUM: 4.6 mmol/L (ref 3.5–5.1)
Sodium: 144 mmol/L (ref 135–145)

## 2018-01-24 MED ORDER — AMILORIDE HCL 5 MG PO TABS: 20.0000 mg | ORAL_TABLET | Freq: Every day | ORAL | 0 refills | Status: DC

## 2018-01-24 MED ORDER — LITHIUM CARBONATE 300 MG PO TABS: 300.0000 mg | ORAL_TABLET | Freq: Two times a day (BID) | ORAL | 0 refills | Status: DC

## 2018-01-24 MED ORDER — LOPERAMIDE HCL 2 MG PO CAPS: 2.0000 mg | ORAL_CAPSULE | Freq: Three times a day (TID) | ORAL | 0 refills | Status: DC | PRN

## 2018-01-24 NOTE — Progress Notes (Signed)
[facility] ROUNDING NOTE   Subjective:   No complaints this morning  Blood pressure 90/29 pulse 70 temperature 97.9 O2 sats 96% room air  Sodium 144 potassium 4.6 chloride 114 CO2 21 BUN 17 creatinine 1.33 calcium 9.1 glucose 104  Objective:  Vital signs in last 24 hours:  Temp:  [97.8 F (36.6 C)-98.9 F (37.2 C)] 97.9 F (36.6 C) (12/23 0756) Pulse Rate:  [63-72] 70 (12/23 0756) Resp:  [18-20] 20 (12/23 0756) BP: (90-103)/(29-70) 90/29 (12/23 0756) SpO2:  [96 %-100 %] 96 % (12/23 0756)  Weight change:  Filed Weights   01/20/18 0411 01/21/18 0330 01/23/18 0500  Weight: 90.9 kg 90.5 kg 87.5 kg    Intake/Output: I/O last 3 completed shifts: In: 1356.1 [I.V.:1356.1] Out: 7353 [Urine:5850]   Intake/Output this shift:  No intake/output data recorded.  CVS- RRR no rubs gallops appreciated RS- CTA no wheezes or rales supple neck with midline trachea ABD- BS present soft non-distended EXT- no edema   Basic Metabolic Panel: Recent Labs  Lab 01/20/18 0452 01/21/18 0400 01/22/18 0516 01/23/18 0457 01/24/18 0434  NA 151* 143 151* 146* 144  K 4.0 4.0 4.5 4.7 4.6  CL 125* 118* 123* 116* 114*  CO2 17* 19* 21* 21* 21*  GLUCOSE 119* 113* 104* 105* 104*  BUN 11 14 14 14 17   CREATININE 1.39* 1.39* 1.37* 1.35* 1.33*  CALCIUM 8.7* 8.5* 9.1 8.9 9.1    Liver Function Tests: No results for input(s): AST, ALT, ALKPHOS, BILITOT, PROT, ALBUMIN in the last 168 hours. No results for input(s): LIPASE, AMYLASE in the last 168 hours. No results for input(s): AMMONIA in the last 168 hours.  CBC: No results for input(s): WBC, NEUTROABS, HGB, HCT, MCV, PLT in the last 168 hours.  Cardiac Enzymes: No results for input(s): CKTOTAL, CKMB, CKMBINDEX, TROPONINI in the last 168 hours.  BNP: Invalid input(s): POCBNP  CBG: No results for input(s): GLUCAP in the last 168 hours.  Microbiology: Results for orders placed or performed during the hospital encounter of  01/13/18  Blood culture (routine x 2)     Status: None   Collection Time: 01/13/18  1:44 PM  Result Value Ref Range Status   Specimen Description BLOOD LEFT ANTECUBITAL  Final   Special Requests   Final    BOTTLES DRAWN AEROBIC AND ANAEROBIC Blood Culture adequate volume   Culture   Final    NO GROWTH 5 DAYS Performed at [facility], [address] [place], [place] [address]    Report Status 01/18/2018 FINAL  Final  Urine culture     Status: Abnormal   Collection Time: 01/13/18  1:52 PM  Result Value Ref Range Status   Specimen Description URINE, CATHETERIZED  Final   Special Requests NONE  Final   Culture (A)  Final    <10,000 COLONIES/mL INSIGNIFICANT GROWTH Performed at [facility], [place] [address], [place], [place] [address]    Report Status 01/14/2018 FINAL  Final  Blood culture (routine x 2)     Status: None   Collection Time: 01/13/18  2:53 PM  Result Value Ref Range Status   Specimen Description BLOOD LEFT ARM  Final   Special Requests   Final    BOTTLES DRAWN AEROBIC AND ANAEROBIC Blood Culture adequate volume   Culture   Final    NO GROWTH 5 DAYS Performed at [facility] [address], [place], [place] [address]    Report Status 01/18/2018 FINAL  Final  MRSA PCR Screening     Status: None   Collection Time: 01/13/18  5:08 PM  Result Value Ref Range Status   MRSA by PCR NEGATIVE NEGATIVE Final    Comment:        The GeneXpert MRSA Assay (FDA approved for NASAL specimens only), is one component of a comprehensive MRSA colonization surveillance program. It is not intended to diagnose MRSA infection nor to guide or monitor treatment for MRSA infections. Performed at [facility], [place] [address] [place], [place] [address]   C difficile quick scan w PCR reflex     Status: None   Collection Time: 01/19/18 11:33 AM  Result Value Ref Range Status   C Diff antigen NEGATIVE NEGATIVE Final   C Diff toxin NEGATIVE NEGATIVE Final   C  Diff interpretation No C. difficile detected.  Final    Comment: Performed at [facility], [place] [address] [place], [place] [address]    Coagulation Studies: No results for input(s): LABPROT, INR in the last 72 hours.  Urinalysis: No results for input(s): COLORURINE, LABSPEC, PHURINE, GLUCOSEU, HGBUR, BILIRUBINUR, KETONESUR, PROTEINUR, UROBILINOGEN, NITRITE, LEUKOCYTESUR in the last 72 hours.  Invalid input(s): APPERANCEUR    Imaging: No results found.   Medications:   . sodium chloride 250 mL (01/18/18 0547)   . aMILoride  20 mg Oral Daily  . chlorhexidine  15 mL Mouth Rinse BID  . fluPHENAZine  4 mg Oral QHS  . lithium carbonate  300 mg Oral BID  . traZODone  50 mg Oral QHS   loperamide  Assessment/ Plan:   Chronic renal insufficiency probably secondary to long-term lithium use.  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  D5W has been discontinued.  Continues to drink.  Twice monthly renal panel appears to be reasonable.  CKD 3 with some mild changes in renal function continue to monitor may need to discontinue or change lithium at some point  Hypokalemia stable replete  Schizoaffective disorder presently on lithium does not wish to stop  Will sign off at this point.  Follow-up can be arranged at [facility] if deemed necessary by primary team   LOS: [place] @TODAY @9 :15 AM

## 2018-01-24 NOTE — Progress Notes (Addendum)
Patient is set to discharge to [facility] SN[facility] today. Patient & sister, [name], aware. Discharge packet given to RN. PTAR scheduled for 1PM  Kingsley Spittle, LCSW Clinical Social Worker [phone]

## 2018-01-25 DIAGNOSIS — J690 Pneumonitis due to inhalation of food and vomit: Secondary | ICD-10-CM | POA: Diagnosis not present

## 2018-01-25 DIAGNOSIS — E871 Hypo-osmolality and hyponatremia: Secondary | ICD-10-CM | POA: Diagnosis not present

## 2018-01-25 DIAGNOSIS — E232 Diabetes insipidus: Secondary | ICD-10-CM | POA: Diagnosis not present

## 2018-01-25 DIAGNOSIS — R2689 Other abnormalities of gait and mobility: Secondary | ICD-10-CM | POA: Diagnosis not present

## 2018-01-25 DIAGNOSIS — I10 Essential (primary) hypertension: Secondary | ICD-10-CM | POA: Diagnosis not present

## 2018-01-25 DIAGNOSIS — G4733 Obstructive sleep apnea (adult) (pediatric): Secondary | ICD-10-CM | POA: Diagnosis not present

## 2018-01-25 DIAGNOSIS — E870 Hyperosmolality and hypernatremia: Secondary | ICD-10-CM | POA: Diagnosis not present

## 2018-01-25 DIAGNOSIS — F259 Schizoaffective disorder, unspecified: Secondary | ICD-10-CM | POA: Diagnosis not present

## 2018-01-25 DIAGNOSIS — L859 Epidermal thickening, unspecified: Secondary | ICD-10-CM | POA: Diagnosis not present

## 2018-02-03 ENCOUNTER — Telehealth: Payer: Self-pay | Admitting: *Deleted

## 2018-02-03 DIAGNOSIS — F259 Schizoaffective disorder, unspecified: Secondary | ICD-10-CM | POA: Diagnosis not present

## 2018-02-03 DIAGNOSIS — J690 Pneumonitis due to inhalation of food and vomit: Secondary | ICD-10-CM | POA: Diagnosis not present

## 2018-02-03 DIAGNOSIS — E870 Hyperosmolality and hypernatremia: Secondary | ICD-10-CM | POA: Diagnosis not present

## 2018-02-03 DIAGNOSIS — E232 Diabetes insipidus: Secondary | ICD-10-CM | POA: Diagnosis not present

## 2018-02-03 NOTE — Telephone Encounter (Signed)
No other suggestions at this time unfortunately. Best to d/w Dr. Brett Fairy as far as alternative treatments. Please offer appointment.

## 2018-02-03 NOTE — Telephone Encounter (Signed)
Called and spoke to sister, [name].  Pt is in Rehab now at [facility].  She stated that [facility] DME, mask fits when on, but pt will not be able to place it on herself per sister when she gets home.  Is there another type of mask that would be easier for her to use.  I relayed that was trying to make appt for her 60-90 day bipap compliance.  Then she stated pt does not want to use.    Not using regularly since in hospital/ rehab.  [facility] rep stated to ask [place].

## 2018-02-04 ENCOUNTER — Other Ambulatory Visit: Payer: Self-pay | Admitting: *Deleted

## 2018-02-04 NOTE — Patient Outreach (Signed)
[facility]) Care Management  02/04/2018  Bridget Jackson 09-16-1950 [id number]  Onsite visit to facility. Met with Peggy, SW, she reports patient will prob discharge back to her independent living apartment next week.  Met with patient in her room. She states that she does plan to go back to her ILF at the [facility].  She states that her sister is supportive. Her sister transports her to her appointments and makes meals that she freezes and patient can microwave to reheat and eat.  Patient states she was independent with her medication management, she states no issues with affording medications but is unsure with the new year.   RNCM discussed [facility] care management services and left a packet.  Will follow up at next onsite visit with patient and collaborate with [facility] [place]. Laymond Purser, MSN, RN, [place] [phone]) Business Cell  [phone]) Toll Free Office

## 2018-02-08 NOTE — Telephone Encounter (Signed)
Sent message to [facility] about mask to see what I can find out.

## 2018-02-09 NOTE — Telephone Encounter (Signed)
Spoke to [place] at [facility],  She stated that all mask (nasal, pillow)   have head gear, that would wrap around her head.  If she has hard time putting on herself, then this would not work for her.  Can have mask refit, (insurance for her will pay for one mask every 90 days and head gear every 6 months).  At this time she would have to pay out of pocket most likely.

## 2018-02-09 NOTE — Telephone Encounter (Addendum)
Spoke to [place], sister of pt.  Pt is not using,  Cannot put on herself (being capable of doing per sister).  Started with bipap 12/22/2017 and has not really used this (per compliance 8days less then 4 hours).  Is glad for appt to discuss.  02-14-18 at 1345.

## 2018-02-09 NOTE — Telephone Encounter (Signed)
Ok forNP.

## 2018-02-10 DIAGNOSIS — E232 Diabetes insipidus: Secondary | ICD-10-CM | POA: Diagnosis not present

## 2018-02-10 DIAGNOSIS — N183 Chronic kidney disease, stage 3 (moderate): Secondary | ICD-10-CM | POA: Diagnosis not present

## 2018-02-10 DIAGNOSIS — E870 Hyperosmolality and hypernatremia: Secondary | ICD-10-CM | POA: Diagnosis not present

## 2018-02-10 DIAGNOSIS — J690 Pneumonitis due to inhalation of food and vomit: Secondary | ICD-10-CM | POA: Diagnosis not present

## 2018-02-10 NOTE — Telephone Encounter (Signed)
Received: Today  Message Contents  Maryan Char, RN; Kandee Keen, Merrie Roof  I have pulled this message and sent into our group to have pt's sister contacted to discuss pap mask refit and education. Thanks.   Angie

## 2018-02-11 ENCOUNTER — Other Ambulatory Visit: Payer: Self-pay | Admitting: *Deleted

## 2018-02-11 NOTE — Patient Outreach (Signed)
[facility]) Care Management  02/11/2018  Bridget Jackson 04-29-1950 [id number]   Onsite visit.  Met with patient in her room. She reports she is actually going home today.  She reports that she will have [facility] home care.  She reports she does not feel she has any [facility] care management needs at this time.  She has the [facility] folder and RNCM encouraged her to take it home and keep the information and put the 24 hour nurse line magnet on the fridge.   Plan to sign off. Royetta Crochet. Laymond Purser, MSN, AGNP-C, [place] [phone]) Business Cell  [phone]) Toll Free Office

## 2018-02-12 DIAGNOSIS — M4807 Spinal stenosis, lumbosacral region: Secondary | ICD-10-CM | POA: Diagnosis not present

## 2018-02-12 DIAGNOSIS — J690 Pneumonitis due to inhalation of food and vomit: Secondary | ICD-10-CM | POA: Diagnosis not present

## 2018-02-12 DIAGNOSIS — F259 Schizoaffective disorder, unspecified: Secondary | ICD-10-CM | POA: Diagnosis not present

## 2018-02-12 DIAGNOSIS — M25512 Pain in left shoulder: Secondary | ICD-10-CM | POA: Diagnosis not present

## 2018-02-12 DIAGNOSIS — E785 Hyperlipidemia, unspecified: Secondary | ICD-10-CM | POA: Diagnosis not present

## 2018-02-12 DIAGNOSIS — A419 Sepsis, unspecified organism: Secondary | ICD-10-CM | POA: Diagnosis not present

## 2018-02-12 DIAGNOSIS — M4317 Spondylolisthesis, lumbosacral region: Secondary | ICD-10-CM | POA: Diagnosis not present

## 2018-02-12 DIAGNOSIS — Z9181 History of falling: Secondary | ICD-10-CM | POA: Diagnosis not present

## 2018-02-12 DIAGNOSIS — E232 Diabetes insipidus: Secondary | ICD-10-CM | POA: Diagnosis not present

## 2018-02-12 DIAGNOSIS — M48061 Spinal stenosis, lumbar region without neurogenic claudication: Secondary | ICD-10-CM | POA: Diagnosis not present

## 2018-02-12 DIAGNOSIS — I959 Hypotension, unspecified: Secondary | ICD-10-CM | POA: Diagnosis not present

## 2018-02-12 DIAGNOSIS — G4733 Obstructive sleep apnea (adult) (pediatric): Secondary | ICD-10-CM | POA: Diagnosis not present

## 2018-02-12 DIAGNOSIS — Z7902 Long term (current) use of antithrombotics/antiplatelets: Secondary | ICD-10-CM | POA: Diagnosis not present

## 2018-02-12 DIAGNOSIS — N183 Chronic kidney disease, stage 3 (moderate): Secondary | ICD-10-CM | POA: Diagnosis not present

## 2018-02-12 DIAGNOSIS — M47816 Spondylosis without myelopathy or radiculopathy, lumbar region: Secondary | ICD-10-CM | POA: Diagnosis not present

## 2018-02-12 DIAGNOSIS — I129 Hypertensive chronic kidney disease with stage 1 through stage 4 chronic kidney disease, or unspecified chronic kidney disease: Secondary | ICD-10-CM | POA: Diagnosis not present

## 2018-02-12 DIAGNOSIS — I69328 Other speech and language deficits following cerebral infarction: Secondary | ICD-10-CM | POA: Diagnosis not present

## 2018-02-12 DIAGNOSIS — G9341 Metabolic encephalopathy: Secondary | ICD-10-CM | POA: Diagnosis not present

## 2018-02-12 DIAGNOSIS — I69351 Hemiplegia and hemiparesis following cerebral infarction affecting right dominant side: Secondary | ICD-10-CM | POA: Diagnosis not present

## 2018-02-12 DIAGNOSIS — R251 Tremor, unspecified: Secondary | ICD-10-CM | POA: Diagnosis not present
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
## 2018-02-14 ENCOUNTER — Ambulatory Visit: Payer: Self-pay | Admitting: Nurse Practitioner

## 2018-02-14 ENCOUNTER — Telehealth: Payer: Self-pay | Admitting: *Deleted

## 2018-02-14 DIAGNOSIS — I69328 Other speech and language deficits following cerebral infarction: Secondary | ICD-10-CM | POA: Diagnosis not present

## 2018-02-14 DIAGNOSIS — A419 Sepsis, unspecified organism: Secondary | ICD-10-CM | POA: Diagnosis not present

## 2018-02-14 DIAGNOSIS — G9341 Metabolic encephalopathy: Secondary | ICD-10-CM | POA: Diagnosis not present

## 2018-02-14 DIAGNOSIS — J690 Pneumonitis due to inhalation of food and vomit: Secondary | ICD-10-CM | POA: Diagnosis not present

## 2018-02-14 DIAGNOSIS — I959 Hypotension, unspecified: Secondary | ICD-10-CM | POA: Diagnosis not present

## 2018-02-14 DIAGNOSIS — E232 Diabetes insipidus: Secondary | ICD-10-CM | POA: Diagnosis not present

## 2018-02-14 NOTE — Telephone Encounter (Signed)
Patient called this morning and cancelled her follow up today, re: her sister didn't want to bring her out in the weather. Patient was rescheduled for Wed.

## 2018-02-15 ENCOUNTER — Encounter: Payer: Self-pay | Admitting: Nurse Practitioner

## 2018-02-15 DIAGNOSIS — E232 Diabetes insipidus: Secondary | ICD-10-CM | POA: Diagnosis not present

## 2018-02-15 DIAGNOSIS — I69328 Other speech and language deficits following cerebral infarction: Secondary | ICD-10-CM | POA: Diagnosis not present

## 2018-02-15 DIAGNOSIS — G9341 Metabolic encephalopathy: Secondary | ICD-10-CM | POA: Diagnosis not present

## 2018-02-15 DIAGNOSIS — J690 Pneumonitis due to inhalation of food and vomit: Secondary | ICD-10-CM | POA: Diagnosis not present

## 2018-02-15 DIAGNOSIS — A419 Sepsis, unspecified organism: Secondary | ICD-10-CM | POA: Diagnosis not present

## 2018-02-15 DIAGNOSIS — I959 Hypotension, unspecified: Secondary | ICD-10-CM | POA: Diagnosis not present
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
## 2018-02-16 ENCOUNTER — Telehealth: Payer: Self-pay

## 2018-02-16 ENCOUNTER — Ambulatory Visit: Payer: Self-pay | Admitting: Nurse Practitioner

## 2018-02-16 NOTE — Telephone Encounter (Signed)
Spoke with patient's sister Debra(DPR verified) and she stated that her sister is unable to wear the mask due to her being a mouth breather when she's sleeping. Her sister stated that they have been in contact with the [facility] but have received the run around. She wants to know would her sister benefit from using the nose piece only mask versus the nose/mouth piece mask. Appointment for today has been cancelled. Please advise.

## 2018-02-16 NOTE — Telephone Encounter (Signed)
Please copy and paste message from sister and send to Charlett Blake and get her to follow up with this pt regarding mask fit

## 2018-02-16 NOTE — Telephone Encounter (Signed)
I have reached out to Patsy Baltimore and Darlina Guys with [facility] in regards to her mask fit. I will update once I know something.

## 2018-02-17 DIAGNOSIS — I69328 Other speech and language deficits following cerebral infarction: Secondary | ICD-10-CM | POA: Diagnosis not present

## 2018-02-17 DIAGNOSIS — J690 Pneumonitis due to inhalation of food and vomit: Secondary | ICD-10-CM | POA: Diagnosis not present

## 2018-02-17 DIAGNOSIS — A419 Sepsis, unspecified organism: Secondary | ICD-10-CM | POA: Diagnosis not present

## 2018-02-17 DIAGNOSIS — E232 Diabetes insipidus: Secondary | ICD-10-CM | POA: Diagnosis not present

## 2018-02-17 DIAGNOSIS — G9341 Metabolic encephalopathy: Secondary | ICD-10-CM | POA: Diagnosis not present

## 2018-02-17 DIAGNOSIS — I959 Hypotension, unspecified: Secondary | ICD-10-CM | POA: Diagnosis not present

## 2018-02-18 DIAGNOSIS — E232 Diabetes insipidus: Secondary | ICD-10-CM | POA: Diagnosis not present

## 2018-02-18 DIAGNOSIS — G9341 Metabolic encephalopathy: Secondary | ICD-10-CM | POA: Diagnosis not present

## 2018-02-18 DIAGNOSIS — J690 Pneumonitis due to inhalation of food and vomit: Secondary | ICD-10-CM | POA: Diagnosis not present

## 2018-02-18 DIAGNOSIS — I69328 Other speech and language deficits following cerebral infarction: Secondary | ICD-10-CM | POA: Diagnosis not present

## 2018-02-18 DIAGNOSIS — A419 Sepsis, unspecified organism: Secondary | ICD-10-CM | POA: Diagnosis not present

## 2018-02-18 DIAGNOSIS — I959 Hypotension, unspecified: Secondary | ICD-10-CM | POA: Diagnosis not present

## 2018-02-21 DIAGNOSIS — E232 Diabetes insipidus: Secondary | ICD-10-CM | POA: Diagnosis not present

## 2018-02-21 DIAGNOSIS — J690 Pneumonitis due to inhalation of food and vomit: Secondary | ICD-10-CM | POA: Diagnosis not present

## 2018-02-21 DIAGNOSIS — I959 Hypotension, unspecified: Secondary | ICD-10-CM | POA: Diagnosis not present

## 2018-02-21 DIAGNOSIS — I69328 Other speech and language deficits following cerebral infarction: Secondary | ICD-10-CM | POA: Diagnosis not present

## 2018-02-21 DIAGNOSIS — A419 Sepsis, unspecified organism: Secondary | ICD-10-CM | POA: Diagnosis not present

## 2018-02-21 DIAGNOSIS — G9341 Metabolic encephalopathy: Secondary | ICD-10-CM | POA: Diagnosis not present

## 2018-02-24 DIAGNOSIS — J690 Pneumonitis due to inhalation of food and vomit: Secondary | ICD-10-CM | POA: Diagnosis not present

## 2018-02-24 DIAGNOSIS — G9341 Metabolic encephalopathy: Secondary | ICD-10-CM | POA: Diagnosis not present

## 2018-02-24 DIAGNOSIS — A419 Sepsis, unspecified organism: Secondary | ICD-10-CM | POA: Diagnosis not present

## 2018-02-24 DIAGNOSIS — I959 Hypotension, unspecified: Secondary | ICD-10-CM | POA: Diagnosis not present

## 2018-02-24 DIAGNOSIS — I69328 Other speech and language deficits following cerebral infarction: Secondary | ICD-10-CM | POA: Diagnosis not present

## 2018-02-24 DIAGNOSIS — E232 Diabetes insipidus: Secondary | ICD-10-CM | POA: Diagnosis not present

## 2018-02-25 DIAGNOSIS — N390 Urinary tract infection, site not specified: Secondary | ICD-10-CM | POA: Diagnosis not present

## 2018-02-25 DIAGNOSIS — R350 Frequency of micturition: Secondary | ICD-10-CM | POA: Diagnosis not present

## 2018-02-25 DIAGNOSIS — E871 Hypo-osmolality and hyponatremia: Secondary | ICD-10-CM | POA: Diagnosis not present

## 2018-02-28 DIAGNOSIS — G9341 Metabolic encephalopathy: Secondary | ICD-10-CM | POA: Diagnosis not present

## 2018-02-28 DIAGNOSIS — I959 Hypotension, unspecified: Secondary | ICD-10-CM | POA: Diagnosis not present

## 2018-02-28 DIAGNOSIS — E232 Diabetes insipidus: Secondary | ICD-10-CM | POA: Diagnosis not present

## 2018-02-28 DIAGNOSIS — J690 Pneumonitis due to inhalation of food and vomit: Secondary | ICD-10-CM | POA: Diagnosis not present

## 2018-02-28 DIAGNOSIS — A419 Sepsis, unspecified organism: Secondary | ICD-10-CM | POA: Diagnosis not present

## 2018-02-28 DIAGNOSIS — I69328 Other speech and language deficits following cerebral infarction: Secondary | ICD-10-CM | POA: Diagnosis not present

## 2018-03-01 DIAGNOSIS — E232 Diabetes insipidus: Secondary | ICD-10-CM | POA: Diagnosis not present

## 2018-03-01 DIAGNOSIS — Z6834 Body mass index (BMI) 34.0-34.9, adult: Secondary | ICD-10-CM | POA: Diagnosis not present

## 2018-03-01 DIAGNOSIS — A419 Sepsis, unspecified organism: Secondary | ICD-10-CM | POA: Diagnosis not present

## 2018-03-01 DIAGNOSIS — G4733 Obstructive sleep apnea (adult) (pediatric): Secondary | ICD-10-CM | POA: Diagnosis not present

## 2018-03-01 DIAGNOSIS — J690 Pneumonitis due to inhalation of food and vomit: Secondary | ICD-10-CM | POA: Diagnosis not present

## 2018-03-01 DIAGNOSIS — I69328 Other speech and language deficits following cerebral infarction: Secondary | ICD-10-CM | POA: Diagnosis not present

## 2018-03-01 DIAGNOSIS — F259 Schizoaffective disorder, unspecified: Secondary | ICD-10-CM | POA: Diagnosis not present

## 2018-03-01 DIAGNOSIS — N183 Chronic kidney disease, stage 3 (moderate): Secondary | ICD-10-CM | POA: Diagnosis not present

## 2018-03-01 DIAGNOSIS — I959 Hypotension, unspecified: Secondary | ICD-10-CM | POA: Diagnosis not present

## 2018-03-01 DIAGNOSIS — G9341 Metabolic encephalopathy: Secondary | ICD-10-CM | POA: Diagnosis not present

## 2018-03-01 DIAGNOSIS — Z8673 Personal history of transient ischemic attack (TIA), and cerebral infarction without residual deficits: Secondary | ICD-10-CM | POA: Diagnosis not present

## 2018-03-02 DIAGNOSIS — I69328 Other speech and language deficits following cerebral infarction: Secondary | ICD-10-CM | POA: Diagnosis not present

## 2018-03-02 DIAGNOSIS — A419 Sepsis, unspecified organism: Secondary | ICD-10-CM | POA: Diagnosis not present

## 2018-03-02 DIAGNOSIS — G9341 Metabolic encephalopathy: Secondary | ICD-10-CM | POA: Diagnosis not present

## 2018-03-02 DIAGNOSIS — E232 Diabetes insipidus: Secondary | ICD-10-CM | POA: Diagnosis not present

## 2018-03-02 DIAGNOSIS — J690 Pneumonitis due to inhalation of food and vomit: Secondary | ICD-10-CM | POA: Diagnosis not present

## 2018-03-02 DIAGNOSIS — I959 Hypotension, unspecified: Secondary | ICD-10-CM | POA: Diagnosis not present

## 2018-03-07 DIAGNOSIS — I69328 Other speech and language deficits following cerebral infarction: Secondary | ICD-10-CM | POA: Diagnosis not present

## 2018-03-07 DIAGNOSIS — G9341 Metabolic encephalopathy: Secondary | ICD-10-CM | POA: Diagnosis not present

## 2018-03-07 DIAGNOSIS — I959 Hypotension, unspecified: Secondary | ICD-10-CM | POA: Diagnosis not present

## 2018-03-07 DIAGNOSIS — A419 Sepsis, unspecified organism: Secondary | ICD-10-CM | POA: Diagnosis not present

## 2018-03-07 DIAGNOSIS — E232 Diabetes insipidus: Secondary | ICD-10-CM | POA: Diagnosis not present

## 2018-03-07 DIAGNOSIS — J690 Pneumonitis due to inhalation of food and vomit: Secondary | ICD-10-CM | POA: Diagnosis not present

## 2018-03-10 DIAGNOSIS — I959 Hypotension, unspecified: Secondary | ICD-10-CM | POA: Diagnosis not present

## 2018-03-10 DIAGNOSIS — G9341 Metabolic encephalopathy: Secondary | ICD-10-CM | POA: Diagnosis not present

## 2018-03-10 DIAGNOSIS — I69328 Other speech and language deficits following cerebral infarction: Secondary | ICD-10-CM | POA: Diagnosis not present

## 2018-03-10 DIAGNOSIS — A419 Sepsis, unspecified organism: Secondary | ICD-10-CM | POA: Diagnosis not present

## 2018-03-10 DIAGNOSIS — J690 Pneumonitis due to inhalation of food and vomit: Secondary | ICD-10-CM | POA: Diagnosis not present

## 2018-03-10 DIAGNOSIS — E232 Diabetes insipidus: Secondary | ICD-10-CM | POA: Diagnosis not present

## 2018-03-14 DIAGNOSIS — I959 Hypotension, unspecified: Secondary | ICD-10-CM | POA: Diagnosis not present

## 2018-03-14 DIAGNOSIS — M4807 Spinal stenosis, lumbosacral region: Secondary | ICD-10-CM | POA: Diagnosis not present

## 2018-03-14 DIAGNOSIS — I129 Hypertensive chronic kidney disease with stage 1 through stage 4 chronic kidney disease, or unspecified chronic kidney disease: Secondary | ICD-10-CM | POA: Diagnosis not present

## 2018-03-14 DIAGNOSIS — A419 Sepsis, unspecified organism: Secondary | ICD-10-CM | POA: Diagnosis not present

## 2018-03-14 DIAGNOSIS — M47816 Spondylosis without myelopathy or radiculopathy, lumbar region: Secondary | ICD-10-CM | POA: Diagnosis not present

## 2018-03-14 DIAGNOSIS — I69328 Other speech and language deficits following cerebral infarction: Secondary | ICD-10-CM | POA: Diagnosis not present

## 2018-03-14 DIAGNOSIS — Z7902 Long term (current) use of antithrombotics/antiplatelets: Secondary | ICD-10-CM | POA: Diagnosis not present

## 2018-03-14 DIAGNOSIS — R251 Tremor, unspecified: Secondary | ICD-10-CM | POA: Diagnosis not present

## 2018-03-14 DIAGNOSIS — M48061 Spinal stenosis, lumbar region without neurogenic claudication: Secondary | ICD-10-CM | POA: Diagnosis not present

## 2018-03-14 DIAGNOSIS — M4317 Spondylolisthesis, lumbosacral region: Secondary | ICD-10-CM | POA: Diagnosis not present

## 2018-03-14 DIAGNOSIS — J690 Pneumonitis due to inhalation of food and vomit: Secondary | ICD-10-CM | POA: Diagnosis not present

## 2018-03-14 DIAGNOSIS — I69351 Hemiplegia and hemiparesis following cerebral infarction affecting right dominant side: Secondary | ICD-10-CM | POA: Diagnosis not present

## 2018-03-14 DIAGNOSIS — M25512 Pain in left shoulder: Secondary | ICD-10-CM | POA: Diagnosis not present

## 2018-03-14 DIAGNOSIS — F259 Schizoaffective disorder, unspecified: Secondary | ICD-10-CM | POA: Diagnosis not present

## 2018-03-14 DIAGNOSIS — G9341 Metabolic encephalopathy: Secondary | ICD-10-CM | POA: Diagnosis not present

## 2018-03-14 DIAGNOSIS — G4733 Obstructive sleep apnea (adult) (pediatric): Secondary | ICD-10-CM | POA: Diagnosis not present

## 2018-03-14 DIAGNOSIS — N183 Chronic kidney disease, stage 3 (moderate): Secondary | ICD-10-CM | POA: Diagnosis not present

## 2018-03-14 DIAGNOSIS — Z9181 History of falling: Secondary | ICD-10-CM | POA: Diagnosis not present

## 2018-03-14 DIAGNOSIS — E785 Hyperlipidemia, unspecified: Secondary | ICD-10-CM | POA: Diagnosis not present

## 2018-03-14 DIAGNOSIS — E232 Diabetes insipidus: Secondary | ICD-10-CM | POA: Diagnosis not present

## 2018-03-16 DIAGNOSIS — N183 Chronic kidney disease, stage 3 (moderate): Secondary | ICD-10-CM | POA: Diagnosis not present

## 2018-03-17 ENCOUNTER — Encounter (HOSPITAL_COMMUNITY): Payer: Self-pay | Admitting: *Deleted

## 2018-03-17 ENCOUNTER — Emergency Department (HOSPITAL_COMMUNITY): Payer: Medicare Other

## 2018-03-17 ENCOUNTER — Inpatient Hospital Stay (HOSPITAL_COMMUNITY)
Admission: EM | Admit: 2018-03-17 | Discharge: 2018-03-28 | DRG: 071 | Disposition: A | Discharge status: Skilled Nursing Facility | Payer: Medicare Other | Attending: Internal Medicine | Admitting: Internal Medicine

## 2018-03-17 DIAGNOSIS — E785 Hyperlipidemia, unspecified: Secondary | ICD-10-CM | POA: Diagnosis present

## 2018-03-17 DIAGNOSIS — N183 Chronic kidney disease, stage 3 (moderate): Secondary | ICD-10-CM | POA: Diagnosis present

## 2018-03-17 DIAGNOSIS — R5381 Other malaise: Secondary | ICD-10-CM | POA: Diagnosis not present

## 2018-03-17 DIAGNOSIS — M2550 Pain in unspecified joint: Secondary | ICD-10-CM | POA: Diagnosis not present

## 2018-03-17 DIAGNOSIS — M6281 Muscle weakness (generalized): Secondary | ICD-10-CM | POA: Diagnosis not present

## 2018-03-17 DIAGNOSIS — H5500 Unspecified nystagmus: Secondary | ICD-10-CM | POA: Diagnosis present

## 2018-03-17 DIAGNOSIS — Z888 Allergy status to other drugs, medicaments and biological substances status: Secondary | ICD-10-CM

## 2018-03-17 DIAGNOSIS — Z79899 Other long term (current) drug therapy: Secondary | ICD-10-CM

## 2018-03-17 DIAGNOSIS — I495 Sick sinus syndrome: Secondary | ICD-10-CM | POA: Diagnosis not present

## 2018-03-17 DIAGNOSIS — E870 Hyperosmolality and hypernatremia: Secondary | ICD-10-CM | POA: Diagnosis present

## 2018-03-17 DIAGNOSIS — E875 Hyperkalemia: Secondary | ICD-10-CM

## 2018-03-17 DIAGNOSIS — T43595A Adverse effect of other antipsychotics and neuroleptics, initial encounter: Secondary | ICD-10-CM | POA: Diagnosis present

## 2018-03-17 DIAGNOSIS — R9431 Abnormal electrocardiogram [ECG] [EKG]: Secondary | ICD-10-CM | POA: Diagnosis not present

## 2018-03-17 DIAGNOSIS — E872 Acidosis, unspecified: Secondary | ICD-10-CM

## 2018-03-17 DIAGNOSIS — I1310 Hypertensive heart and chronic kidney disease without heart failure, with stage 1 through stage 4 chronic kidney disease, or unspecified chronic kidney disease: Secondary | ICD-10-CM | POA: Diagnosis present

## 2018-03-17 DIAGNOSIS — G4733 Obstructive sleep apnea (adult) (pediatric): Secondary | ICD-10-CM | POA: Diagnosis present

## 2018-03-17 DIAGNOSIS — F419 Anxiety disorder, unspecified: Secondary | ICD-10-CM | POA: Diagnosis present

## 2018-03-17 DIAGNOSIS — T56891A Toxic effect of other metals, accidental (unintentional), initial encounter: Secondary | ICD-10-CM | POA: Diagnosis not present

## 2018-03-17 DIAGNOSIS — T56891D Toxic effect of other metals, accidental (unintentional), subsequent encounter: Secondary | ICD-10-CM | POA: Diagnosis not present

## 2018-03-17 DIAGNOSIS — R197 Diarrhea, unspecified: Secondary | ICD-10-CM | POA: Diagnosis present

## 2018-03-17 DIAGNOSIS — G4719 Other hypersomnia: Secondary | ICD-10-CM | POA: Diagnosis not present

## 2018-03-17 DIAGNOSIS — N251 Nephrogenic diabetes insipidus: Secondary | ICD-10-CM | POA: Diagnosis present

## 2018-03-17 DIAGNOSIS — Z789 Other specified health status: Secondary | ICD-10-CM | POA: Diagnosis not present

## 2018-03-17 DIAGNOSIS — G9341 Metabolic encephalopathy: Principal | ICD-10-CM | POA: Diagnosis present

## 2018-03-17 DIAGNOSIS — R2689 Other abnormalities of gait and mobility: Secondary | ICD-10-CM | POA: Diagnosis not present

## 2018-03-17 DIAGNOSIS — I10 Essential (primary) hypertension: Secondary | ICD-10-CM | POA: Diagnosis present

## 2018-03-17 DIAGNOSIS — R4182 Altered mental status, unspecified: Secondary | ICD-10-CM | POA: Diagnosis present

## 2018-03-17 DIAGNOSIS — I959 Hypotension, unspecified: Secondary | ICD-10-CM | POA: Diagnosis present

## 2018-03-17 DIAGNOSIS — Z818 Family history of other mental and behavioral disorders: Secondary | ICD-10-CM | POA: Diagnosis not present

## 2018-03-17 DIAGNOSIS — G4739 Other sleep apnea: Secondary | ICD-10-CM

## 2018-03-17 DIAGNOSIS — R400 Somnolence: Secondary | ICD-10-CM | POA: Diagnosis not present

## 2018-03-17 DIAGNOSIS — Z7902 Long term (current) use of antithrombotics/antiplatelets: Secondary | ICD-10-CM

## 2018-03-17 DIAGNOSIS — Z8673 Personal history of transient ischemic attack (TIA), and cerebral infarction without residual deficits: Secondary | ICD-10-CM | POA: Diagnosis not present

## 2018-03-17 DIAGNOSIS — R410 Disorientation, unspecified: Secondary | ICD-10-CM | POA: Diagnosis not present

## 2018-03-17 DIAGNOSIS — F329 Major depressive disorder, single episode, unspecified: Secondary | ICD-10-CM | POA: Diagnosis present

## 2018-03-17 DIAGNOSIS — Z66 Do not resuscitate: Secondary | ICD-10-CM | POA: Diagnosis present

## 2018-03-17 DIAGNOSIS — N179 Acute kidney failure, unspecified: Secondary | ICD-10-CM

## 2018-03-17 DIAGNOSIS — R0600 Dyspnea, unspecified: Secondary | ICD-10-CM | POA: Diagnosis not present

## 2018-03-17 DIAGNOSIS — Z7401 Bed confinement status: Secondary | ICD-10-CM | POA: Diagnosis not present

## 2018-03-17 DIAGNOSIS — F259 Schizoaffective disorder, unspecified: Secondary | ICD-10-CM | POA: Diagnosis present

## 2018-03-17 DIAGNOSIS — G4731 Primary central sleep apnea: Secondary | ICD-10-CM | POA: Diagnosis not present

## 2018-03-17 DIAGNOSIS — R0602 Shortness of breath: Secondary | ICD-10-CM

## 2018-03-17 LAB — COMPREHENSIVE METABOLIC PANEL
ALBUMIN: 3.8 g/dL (ref 3.5–5.0)
ALT: 21 U/L (ref 0–44)
AST: 28 U/L (ref 15–41)
Alkaline Phosphatase: 100 U/L (ref 38–126)
Anion gap: 8 (ref 5–15)
BUN: 23 mg/dL (ref 8–23)
CO2: 17 mmol/L — ABNORMAL LOW (ref 22–32)
Calcium: 9.9 mg/dL (ref 8.9–10.3)
Chloride: 113 mmol/L — ABNORMAL HIGH (ref 98–111)
Creatinine, Ser: 1.8 mg/dL — ABNORMAL HIGH (ref 0.44–1.00)
GFR calc Af Amer: 33 mL/min — ABNORMAL LOW (ref >60)
GFR calc non Af Amer: 28 mL/min — ABNORMAL LOW (ref >60)
GLUCOSE: 77 mg/dL (ref 70–99)
Potassium: 5.3 mmol/L — ABNORMAL HIGH (ref 3.5–5.1)
Sodium: 138 mmol/L (ref 135–145)
Total Bilirubin: 0.5 mg/dL (ref 0.3–1.2)
Total Protein: 6.4 g/dL — ABNORMAL LOW (ref 6.5–8.1)

## 2018-03-17 LAB — CBC
HCT: 38.8 % (ref 36.0–46.0)
Hemoglobin: 11.8 g/dL — ABNORMAL LOW (ref 12.0–15.0)
MCH: 30.6 pg (ref 26.0–34.0)
MCHC: 30.4 g/dL (ref 30.0–36.0)
MCV: 100.5 fL — ABNORMAL HIGH (ref 80.0–100.0)
Platelets: 336 10*3/uL (ref 150–400)
RBC: 3.86 MIL/uL — ABNORMAL LOW (ref 3.87–5.11)
RDW: 14.6 % (ref 11.5–15.5)
WBC: 9.9 10*3/uL (ref 4.0–10.5)
nRBC: 0 % (ref 0.0–0.2)

## 2018-03-17 LAB — URINALYSIS, COMPLETE (UACMP) WITH MICROSCOPIC
Bacteria, UA: NONE SEEN
Bilirubin Urine: NEGATIVE
Glucose, UA: NEGATIVE mg/dL
Hgb urine dipstick: NEGATIVE
Ketones, ur: NEGATIVE mg/dL
Leukocytes,Ua: NEGATIVE
Nitrite: NEGATIVE
Protein, ur: NEGATIVE mg/dL
Specific Gravity, Urine: 1.008 (ref 1.005–1.030)
pH: 7 (ref 5.0–8.0)

## 2018-03-17 LAB — TROPONIN I: Troponin I: <0.03 ng/mL (ref <0.03)

## 2018-03-17 LAB — POCT I-STAT EG7
Acid-base deficit: 5 mmol/L — ABNORMAL HIGH (ref 0.0–2.0)
BICARBONATE: 19.8 mmol/L — AB (ref 20.0–28.0)
Calcium, Ion: 1.32 mmol/L (ref 1.15–1.40)
HCT: 34 % — ABNORMAL LOW (ref 36.0–46.0)
Hemoglobin: 11.6 g/dL — ABNORMAL LOW (ref 12.0–15.0)
O2 Saturation: 92 %
Potassium: 5.4 mmol/L — ABNORMAL HIGH (ref 3.5–5.1)
Sodium: 138 mmol/L (ref 135–145)
TCO2: 21 mmol/L — ABNORMAL LOW (ref 22–32)
pCO2, Ven: 33.3 mmHg — ABNORMAL LOW (ref 44.0–60.0)
pH, Ven: 7.382 (ref 7.250–7.430)
pO2, Ven: 64 mmHg — ABNORMAL HIGH (ref 32.0–45.0)

## 2018-03-17 LAB — LACTIC ACID, PLASMA
Lactic Acid, Venous: 1.5 mmol/L (ref 0.5–1.9)
Lactic Acid, Venous: 1.2 mmol/L (ref 0.5–1.9)

## 2018-03-17 LAB — I-STAT CREATININE, ED: CREATININE: 1.7 mg/dL — AB (ref 0.44–1.00)

## 2018-03-17 LAB — TSH: TSH: 3.585 u[IU]/mL (ref 0.350–4.500)

## 2018-03-17 LAB — GLUCOSE, CAPILLARY: Glucose-Capillary: 72 mg/dL (ref 70–99)

## 2018-03-17 LAB — LITHIUM LEVEL: Lithium Lvl: 2.21 mmol/L (ref 0.60–1.20)

## 2018-03-17 LAB — CBG MONITORING, ED: Glucose-Capillary: 73 mg/dL (ref 70–99)

## 2018-03-17 LAB — AMMONIA: Ammonia: 25 umol/L (ref 9–35)

## 2018-03-17 MED ORDER — SODIUM CHLORIDE 0.9% FLUSH
3.0000 mL | Freq: Once | INTRAVENOUS | Status: AC
  Administered 2018-03-17: 3 mL via INTRAVENOUS

## 2018-03-17 MED ORDER — ENOXAPARIN SODIUM 30 MG/0.3ML ~~LOC~~ SOLN
30.0000 mg | SUBCUTANEOUS | Status: DC
  Administered 2018-03-17 – 2018-03-19 (×3): 30 mg via SUBCUTANEOUS
  Filled 2018-03-17 (×3): qty 0.3

## 2018-03-17 MED ORDER — FERROUS SULFATE 325 (65 FE) MG PO TABS
325.0000 mg | ORAL_TABLET | Freq: Every day | ORAL | Status: DC
  Administered 2018-03-18 – 2018-03-28 (×11): 325 mg via ORAL
  Filled 2018-03-17 (×11): qty 1

## 2018-03-17 MED ORDER — SODIUM CHLORIDE 0.9 % IV BOLUS
1000.0000 mL | Freq: Once | INTRAVENOUS | Status: AC
  Administered 2018-03-17: 1000 mL via INTRAVENOUS

## 2018-03-17 MED ORDER — LOPERAMIDE HCL 2 MG PO CAPS: 2.0000 mg | ORAL_CAPSULE | Freq: Three times a day (TID) | ORAL | Status: DC | PRN

## 2018-03-17 MED ORDER — AMILORIDE HCL 5 MG PO TABS: 20.0000 mg | ORAL_TABLET | Freq: Every day | ORAL | Status: DC

## 2018-03-17 MED ORDER — SODIUM CHLORIDE 0.9 % IV SOLN
INTRAVENOUS | Status: DC
  Administered 2018-03-17 – 2018-03-18 (×2): via INTRAVENOUS

## 2018-03-17 MED ORDER — DOCUSATE SODIUM 100 MG PO CAPS
100.0000 mg | ORAL_CAPSULE | Freq: Two times a day (BID) | ORAL | Status: DC | PRN
  Administered 2018-03-21: 100 mg via ORAL
  Filled 2018-03-17: qty 1

## 2018-03-17 MED ORDER — VITAMIN D (ERGOCALCIFEROL) 1.25 MG (50000 UNIT) PO CAPS
50000.0000 [IU] | ORAL_CAPSULE | ORAL | Status: DC
  Administered 2018-03-23: 50000 [IU] via ORAL
  Filled 2018-03-17: qty 1

## 2018-03-17 MED ORDER — ADULT MULTIVITAMIN W/MINERALS CH
1.0000 | ORAL_TABLET | Freq: Every day | ORAL | Status: DC
  Administered 2018-03-18 – 2018-03-28 (×11): 1 via ORAL
  Filled 2018-03-17 (×11): qty 1

## 2018-03-17 MED ORDER — CLOPIDOGREL BISULFATE 75 MG PO TABS
75.0000 mg | ORAL_TABLET | Freq: Every day | ORAL | Status: DC
  Administered 2018-03-18 – 2018-03-28 (×11): 75 mg via ORAL
  Filled 2018-03-17 (×11): qty 1

## 2018-03-17 MED ORDER — ACETAMINOPHEN 500 MG PO TABS
500.0000 mg | ORAL_TABLET | Freq: Three times a day (TID) | ORAL | Status: DC | PRN
  Administered 2018-03-17 – 2018-03-19 (×3): 500 mg via ORAL
  Filled 2018-03-17 (×3): qty 1

## 2018-03-17 MED ORDER — FLUPHENAZINE HCL 1 MG PO TABS
4.0000 mg | ORAL_TABLET | Freq: Every day | ORAL | Status: DC
  Administered 2018-03-17 – 2018-03-27 (×11): 4 mg via ORAL
  Filled 2018-03-17 (×13): qty 4

## 2018-03-17 MED ORDER — TRAZODONE HCL 50 MG PO TABS
50.0000 mg | ORAL_TABLET | Freq: Every day | ORAL | Status: DC
  Administered 2018-03-17 – 2018-03-27 (×11): 50 mg via ORAL
  Filled 2018-03-17 (×11): qty 1

## 2018-03-17 MED ORDER — ONDANSETRON HCL 4 MG PO TABS: 4.0000 mg | ORAL_TABLET | Freq: Four times a day (QID) | ORAL | Status: DC | PRN

## 2018-03-17 MED ORDER — SODIUM CHLORIDE 0.9 % IV SOLN
INTRAVENOUS | Status: DC
  Administered 2018-03-17 (×2): via INTRAVENOUS

## 2018-03-17 MED ORDER — BISACODYL 10 MG RE SUPP: 10.0000 mg | Freq: Every day | RECTAL | Status: DC | PRN

## 2018-03-17 MED ORDER — ONDANSETRON HCL 4 MG/2ML IJ SOLN: 4.0000 mg | Freq: Four times a day (QID) | INTRAMUSCULAR | Status: DC | PRN

## 2018-03-17 NOTE — ED Notes (Signed)
Report given to Lauren, RN.

## 2018-03-17 NOTE — ED Notes (Signed)
Patient transported to CT 

## 2018-03-17 NOTE — Progress Notes (Signed)
Notified MD of patients hypotension, said standing order fluid bolus okay to continue. 500ML Fluid bolus given, stated pt BP may be chronically low per review of records. Arlis Porta
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
## 2018-03-17 NOTE — ED Triage Notes (Signed)
Pt here with sister who states that the pt has been fatigued, lethargic, and was disoriented this morning. Pt recently admitted here for shock and low sodium and the sx started like she is today. Hypotensive in triage and appears lethargic.

## 2018-03-17 NOTE — Progress Notes (Signed)
CCMD notified RN about pt. 5.57  pause. Blood pressure 88/45. Patient still A&O x4. MD notified. Nurse will continue to monitor.
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
## 2018-03-17 NOTE — Progress Notes (Signed)
Patient being transferred to 6E-06. Report given to nurse.

## 2018-03-17 NOTE — ED Notes (Signed)
Attempted reports

## 2018-03-17 NOTE — Significant Event (Signed)
Rapid Response Event Note  Overview: Time Called: 1642 Arrival Time: 3016 Event Type: Cardiac  Initial Focused Assessment: Patient recently arrived from the ED with lithium toxicity.  She is alert and oriented. Over the past hour or so she has developed asymptomatic bradycardia with 2-5 sec pauses. BP 88/45-100/53  (baseline BP 90s-100s)   Dr Curt Bears and Joseph Art at bedside to assess patient.   Interventions: Transfer to cardiac progressive care.  Plan of Care (if not transferred):  Event Summary: Name of Physician Notified: Steffanie Dunn at 1642  Name of Consulting Physician Notified: Camnitz at [place]  Outcome: Transferred (Comment)  Event End Time: [place]  Raliegh Ip

## 2018-03-17 NOTE — Progress Notes (Signed)
Patient arrived to 3W-05. Alert and oriented x4. Denies pain. Visitor at bedside. POC has been provided to patient.

## 2018-03-17 NOTE — ED Triage Notes (Signed)
Pt brought in by family reports lethargy and disorientation x 3 days, hx of hyponatremia,denies n/v/d, pt has multiple medical conditions, NAD

## 2018-03-17 NOTE — ED Notes (Signed)
Patient transported to X-ray 
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
## 2018-03-18 ENCOUNTER — Other Ambulatory Visit: Payer: Self-pay

## 2018-03-18 LAB — URINE CULTURE: Culture: <10000 — AB

## 2018-03-18 LAB — BASIC METABOLIC PANEL
Anion gap: 5 (ref 5–15)
BUN: 17 mg/dL (ref 8–23)
CO2: 16 mmol/L — ABNORMAL LOW (ref 22–32)
Calcium: 8.6 mg/dL — ABNORMAL LOW (ref 8.9–10.3)
Chloride: 120 mmol/L — ABNORMAL HIGH (ref 98–111)
Creatinine, Ser: 1.55 mg/dL — ABNORMAL HIGH (ref 0.44–1.00)
GFR calc Af Amer: 39 mL/min — ABNORMAL LOW (ref >60)
GFR calc non Af Amer: 34 mL/min — ABNORMAL LOW (ref >60)
Glucose, Bld: 82 mg/dL (ref 70–99)
Potassium: 5 mmol/L (ref 3.5–5.1)
Sodium: 141 mmol/L (ref 135–145)

## 2018-03-18 LAB — CBC
HCT: 34.7 % — ABNORMAL LOW (ref 36.0–46.0)
Hemoglobin: 10.4 g/dL — ABNORMAL LOW (ref 12.0–15.0)
MCH: 30.8 pg (ref 26.0–34.0)
MCHC: 30 g/dL (ref 30.0–36.0)
MCV: 102.7 fL — ABNORMAL HIGH (ref 80.0–100.0)
Platelets: 233 10*3/uL (ref 150–400)
RBC: 3.38 MIL/uL — ABNORMAL LOW (ref 3.87–5.11)
RDW: 14.8 % (ref 11.5–15.5)
WBC: 7.6 10*3/uL (ref 4.0–10.5)
nRBC: 0 % (ref 0.0–0.2)

## 2018-03-18 LAB — LITHIUM LEVEL: Lithium Lvl: 1.65 mmol/L (ref 0.60–1.20)

## 2018-03-18 MED ORDER — LACTATED RINGERS IV SOLN
INTRAVENOUS | Status: DC
  Administered 2018-03-18 – 2018-03-19 (×2): via INTRAVENOUS

## 2018-03-18 MED ORDER — DOPAMINE-DEXTROSE 3.2-5 MG/ML-% IV SOLN
3.0000 ug/kg/min | INTRAVENOUS | Status: DC
  Administered 2018-03-18: 5 ug/kg/min via INTRAVENOUS
  Filled 2018-03-18: qty 250

## 2018-03-18 NOTE — Progress Notes (Signed)
Patient has had increasing frequency and duration of her sinus pauses, as long as 8 seconds.  The patient is awake and alert, and in in no distress at my visit.  Her sister is at bedside, has observed her to with some but not all of the pauses seem to blankly stare and then "come back in" quickly.  The patient has not had any syncope, or other notable symptoms.  BP has remained at her baseline 90-100's generally.   I have discussed with Dr. Curt Bears, difficult given her very flat baseline affect how symptomatic she is though her sister seems to be able to appreciate some degree of difference/blankness with the longer pauses.  The patient denies any symptoms.  We will go ahead and start dopamine gtt Should she continue to have prolonged pauses, or become clearly symptomatic requiring titration of dopamine, she will need to transfer to ICU, perhaps isuprel.  Tommye Standard, PA-C

## 2018-03-18 NOTE — Progress Notes (Addendum)
Notified by Central Tele of pause of 6.7 seconds.  HR has been running in the low 30s to 40s and at times <20, patient is asymptomatic and HR is non-sustained at the low levels.  I will keep monitoring the patient.

## 2018-03-18 NOTE — Progress Notes (Signed)
Tele reported of 7.07 seconds pause.  Assessed pt.  She stated that she did not feel anything except for feels like having a bowel movement.  She was alert and responsive.  Idolina Primer, RN

## 2018-03-18 NOTE — Progress Notes (Signed)
[facility] Progress Note  Patient: Bridget Jackson MHD:[id number]   PCP: Kathyrn Lass, MD DOB: Apr 21, 1950   DOA: 03/17/2018   DOS: 03/18/2018   Date of Service: the patient was seen and examined on 03/18/2018  Brief hospital course: Pt. with PMH of schizoaffective disorder, HTN, CVA, OSA; admitted on 03/17/2018, presented with complaint of confusion, was found to have lithium toxicity and resultant bradycardia. Currently further plan is continue current care.  Subjective: Feels fatigue.  No nausea no vomiting.  No other acute concerns reported.  No dizziness or lightheadedness no focal deficit.  No tremors.  No diarrhea.  No constipation.  Assessment and Plan: Acute metabolic encephalopathy: Likely the result of lithium toxicity.   Lithium level is over 2 times normal and neurologic exam (confusion, lethargy, nystagmus) and history fit with lithium toxicity.   Head CT shows nothing acute.   Lateral T wave inversions on electrocardiogram can be consistent with lithium toxicity.  No reports of any chest pain.   She also has an AKI which is likely contributing. -Hold lithium -Continue maintenance IV fluids with LR. -Frequent neuro checks  Severe symptomatic bradycardia. EP was consulted yesterday. Initial plan was to watchful waiting. With her increasing pause patient was started on dopamine drip on 03/18/2018. We will monitor. May require to go to ICU.  Essential Hypertension: Patient is only on amiloride at home.  Given her AKI and hyperkalemia and hypotension on presentation will hold this medication for now.  AKI: Likely prerenal due to decreased oral intake, diarrhea, in the setting of diuretic use -Hold amiloride -Dose all meds for GFR, avoid nephrotoxins = Continue IV fluids.  Schizoaffective disorder: -Continue home fluphenazine -Continue home trazodone Sister is against replacing lithium.  Tells me that patient had been on other mood stabilizing agents in the past  and was not able to tolerate them including Lamictal. Tells with a story that during her recent ICU stay after being off of lithium for 1 day patient become psychotic. Recommending her to consult with her primary psychiatrist to consider change in lithium therapy. Discussed with our psychiatry here recommend the same.  History of CVA -Continue Plavix  Diet: Cardiac diet DVT Prophylaxis: subcutaneous Heparin  Advance goals of care discussion: DNR DNI  Family Communication: family was present at bedside, at the time of interview. The pt provided permission to discuss medical plan with the family. Opportunity was given to ask question and all questions were answered satisfactorily.   Disposition:  Discharge to be determined.  Consultants: EP Procedures: none  Scheduled Meds: . clopidogrel  75 mg Oral Daily  . enoxaparin (LOVENOX) injection  30 mg Subcutaneous Q24H  . ferrous sulfate  325 mg Oral Q breakfast  . fluPHENAZine  4 mg Oral QHS  . multivitamin with minerals  1 tablet Oral Daily  . traZODone  50 mg Oral QHS  . [START ON 03/23/2018] Vitamin D (Ergocalciferol)  50,000 Units Oral Q Wed   Continuous Infusions: . DOPamine 5 mcg/kg/min (03/18/18 1520)  . lactated ringers 50 mL/hr at 03/18/18 1116   PRN Meds: acetaminophen, bisacodyl, docusate sodium, loperamide, ondansetron **OR** ondansetron (ZOFRAN) IV Antibiotics: Anti-infectives (From admission, onward)   None       Objective: Physical Exam: Vitals:   03/18/18 1702 03/18/18 1707 03/18/18 1757 03/18/18 1802  BP: (!) 101/58 (!) 94/53 (!) 109/45 (!) 104/56  Pulse: 86 86 79 78  Resp: (!) 24 20 11 10   Temp:      TempSrc:  SpO2: 100% 99% 100% 100%  Weight:        Intake/Output Summary (Last 24 hours) at 03/18/2018 1912 Last data filed at 03/18/2018 1830 Gross per 24 hour  Intake 2695 ml  Output 1000 ml  Net 1695 ml   Filed Weights   03/18/18 0439 03/18/18 0603  Weight: 82.9 kg 82.9 kg   General: Alert,  Awake and Oriented to Time, Place and Person. Appear in moderate distress, affect appropriate Eyes: PERRL, Conjunctiva normal ENT: Oral Mucosa clear moist. Neck: difficult to assess  JVD, no Abnormal Mass Or lumps Cardiovascular: S1 and S2 Present, no Murmur, Peripheral Pulses Present Respiratory: normal respiratory effort, Bilateral Air entry equal and Decreased, no use of accessory muscle, Clear to Auscultation, no Crackles, no wheezes Abdomen: Bowel Sound present, Soft and no tenderness, no hernia Skin: no redness, no Rash, no induration Extremities: bilateral  Pedal edema, no calf tenderness Neurologic: Grossly no focal neuro deficit. Bilaterally Equal motor strength  Data Reviewed: CBC: Recent Labs  Lab 03/17/18 1023 03/17/18 1034 03/18/18 0600  WBC 9.9  --  7.6  HGB 11.8* 11.6* 10.4*  HCT 38.8 34.0* 34.7*  MCV 100.5*  --  102.7*  PLT 336  --  010   Basic Metabolic Panel: Recent Labs  Lab 03/17/18 1023 03/17/18 1034 03/18/18 0341  NA 138 138 141  K 5.3* 5.4* 5.0  CL 113*  --  120*  CO2 17*  --  16*  GLUCOSE 77  --  82  BUN 23  --  17  CREATININE 1.80* 1.70* 1.55*  CALCIUM 9.9  --  8.6*    Liver Function Tests: Recent Labs  Lab 03/17/18 1023  AST 28  ALT 21  ALKPHOS 100  BILITOT 0.5  PROT 6.4*  ALBUMIN 3.8   No results for input(s): LIPASE, AMYLASE in the last 168 hours. Recent Labs  Lab 03/17/18 1025  AMMONIA 25   Coagulation Profile: No results for input(s): INR, PROTIME in the last 168 hours. Cardiac Enzymes: Recent Labs  Lab 03/17/18 1023  TROPONINI <0.03   BNP (last 3 results) No results for input(s): PROBNP in the last 8760 hours. CBG: Recent Labs  Lab 03/17/18 1210 03/17/18 1649  GLUCAP 73 72   Studies: No results found.   Time spent: 35 minutes  Author: Berle Mull, MD [facility] 03/18/2018 7:12 PM  To reach On-call, see care teams to locate the attending and reach out to them via [email or web address]. If 7PM-7AM, please  contact night-coverage If you still have difficulty reaching the attending provider, please page the [facility] (Director on Call) for [facility] on amion for assistance.

## 2018-03-18 NOTE — Progress Notes (Signed)
Progress Note  Patient Name: Bridget Jackson Date of Encounter: 03/18/2018  Primary Cardiologist: No primary care provider on file.  Subjective   Currently feeling well without complaint.  No chest pain or shortness of breath.  Is continued to have pauses on the monitor overnight with episodes of heart rates in the 30s to 40s.  Inpatient Medications    Scheduled Meds: . clopidogrel  75 mg Oral Daily  . enoxaparin (LOVENOX) injection  30 mg Subcutaneous Q24H  . ferrous sulfate  325 mg Oral Q breakfast  . fluPHENAZine  4 mg Oral QHS  . multivitamin with minerals  1 tablet Oral Daily  . traZODone  50 mg Oral QHS  . [START ON 03/23/2018] Vitamin D (Ergocalciferol)  50,000 Units Oral Q Wed   Continuous Infusions:  PRN Meds: acetaminophen, bisacodyl, docusate sodium, loperamide, ondansetron **OR** ondansetron (ZOFRAN) IV   Vital Signs    Vitals:   03/17/18 1757 03/17/18 2055 03/18/18 0439 03/18/18 0603  BP: (!) 98/47 (!) 84/52 110/64 110/64  Pulse: 61 (!) 56 (!) 55 (!) 55  Resp: 12 13 12 12   Temp: (!) 97.4 F (36.3 C) (!) 97.4 F (36.3 C) 97.6 F (36.4 C) 97.6 F (36.4 C)  TempSrc: Axillary Axillary Axillary Axillary  SpO2: 98% 100% 99%   Weight:   82.9 kg 82.9 kg    Intake/Output Summary (Last 24 hours) at 03/18/2018 0745 Last data filed at 03/18/2018 0300 Gross per 24 hour  Intake 1125 ml  Output 550 ml  Net 575 ml   Last 3 Weights 03/18/2018 03/18/2018 01/23/2018  Weight (lbs) 182 lb 12.2 oz 182 lb 12.2 oz 192 lb 14.4 oz  Weight (kg) 82.9 kg 82.9 kg 87.5 kg      Telemetry    Sinus rhythm with intermittent sinus arrest and sinus bradycardia- Personally Reviewed  ECG    None new- Personally Reviewed  Physical Exam   GEN: No acute distress.   Neck: No JVD Cardiac: RRR, no murmurs, rubs, or gallops.  Respiratory: Clear to auscultation bilaterally. GI: Soft, nontender, non-distended  MS: No edema; No deformity. Neuro:  Nonfocal  Psych: Normal affect    Labs    Chemistry Recent Labs  Lab 03/17/18 1023 03/17/18 1034 03/18/18 0341  NA 138 138 141  K 5.3* 5.4* 5.0  CL 113*  --  120*  CO2 17*  --  16*  GLUCOSE 77  --  82  BUN 23  --  17  CREATININE 1.80* 1.70* 1.55*  CALCIUM 9.9  --  8.6*  PROT 6.4*  --   --   ALBUMIN 3.8  --   --   AST 28  --   --   ALT 21  --   --   ALKPHOS 100  --   --   BILITOT 0.5  --   --   GFRNONAA 28*  --  34*  GFRAA 33*  --  39*  ANIONGAP 8  --  5     Hematology Recent Labs  Lab 03/17/18 1023 03/17/18 1034 03/18/18 0600  WBC 9.9  --  7.6  RBC 3.86*  --  3.38*  HGB 11.8* 11.6* 10.4*  HCT 38.8 34.0* 34.7*  MCV 100.5*  --  102.7*  MCH 30.6  --  30.8  MCHC 30.4  --  30.0  RDW 14.6  --  14.8  PLT 336  --  233    Cardiac Enzymes Recent Labs  Lab 03/17/18 1023  TROPONINI <0.03  No results for input(s): TROPIPOC in the last 168 hours.   BNPNo results for input(s): BNP, PROBNP in the last 168 hours.   DDimer No results for input(s): DDIMER in the last 168 hours.   Radiology    Dg Chest 2 View  Result Date: 03/17/2018 CLINICAL DATA:  Lethargy and disorientation for 3 days. EXAM: CHEST - 2 VIEW COMPARISON:  01/15/2018 FINDINGS: Chronic cardiomegaly and aortic tortuosity. Mildly low lung volumes. There is no edema, consolidation, effusion, or pneumothorax. IMPRESSION: No evidence of acute disease. Electronically Signed   By: Monte Fantasia M.D.   On: 03/17/2018 11:23   Ct Head Wo Contrast  Result Date: 03/17/2018 CLINICAL DATA:  Lethargy and disorientation for 3 days. History of hyponatremia. EXAM: CT HEAD WITHOUT CONTRAST TECHNIQUE: Contiguous axial images were obtained from the base of the skull through the vertex without intravenous contrast. COMPARISON:  01/13/2018 FINDINGS: Brain: No evidence of acute infarction, hemorrhage, hydrocephalus, extra-axial collection or mass lesion/mass effect. There is ventricular and sulcal enlargement reflecting mild diffuse atrophy. Mild patchy areas  of white matter hypoattenuation are also noted consistent with chronic microvascular ischemic change. Dilated perivascular space in the sub ganglionic region on the left. Vascular: No hyperdense vessel or unexpected calcification. Skull: Normal. Negative for fracture or focal lesion. Sinuses/Orbits: Globes and orbits are unremarkable. Minimal dependent fluid in the right maxillary sinus. Small amount of dependent mucus in the left maxillary sinus. Other: None. IMPRESSION: 1. No acute intracranial abnormalities. 2. Atrophy and mild chronic microvascular ischemic change, stable from the prior study. Electronically Signed   By: Lajean Manes M.D.   On: 03/17/2018 11:45    Cardiac Studies   TTE 01/15/2018 - Left ventricle: The cavity size was normal. Wall thickness was   normal. Systolic function was normal. The estimated ejection   fraction was in the range of 50% to 55%. Doppler parameters are   consistent with abnormal left ventricular relaxation (grade 1   diastolic dysfunction). - Mitral valve: Calcified annulus. - Left atrium: The atrium was mildly dilated.  Patient Profile     68 y.o. female who presented to the hospital with altered mental status found to be lithium toxic with complications of sinus node dysfunction.  Assessment & Plan    1.  Sinus node dysfunction: Likely secondary to lithium toxicity.  She is continued to have pauses and bradycardia overnight.  She does have a history of obstructive sleep apnea and is not wearing a CPAP at the moment.  Her pauses could be related to a combination of lithium toxicity and sleep apnea.  As her lithium washes out of her system, it is likely that her pauses Tannya Gonet improve.  She has been minimally symptomatic.  Would not recommend any further advanced therapy other than monitoring at this time.  Should she become symptomatic, would likely benefit from isoproterenol infusion.  No indication for pacemaker implant at this time.  Cardiology to see as  needed through the weekend.      For questions or updates, please contact [facility] Please consult [email or web address] for contact info under        Signed, Kashae Carstens Meredith Leeds, MD  03/18/2018, 7:45 AM

## 2018-03-19 LAB — BASIC METABOLIC PANEL
Anion gap: 4 — ABNORMAL LOW (ref 5–15)
BUN: 11 mg/dL (ref 8–23)
CALCIUM: 9.6 mg/dL (ref 8.9–10.3)
CO2: 18 mmol/L — ABNORMAL LOW (ref 22–32)
Chloride: 121 mmol/L — ABNORMAL HIGH (ref 98–111)
Creatinine, Ser: 1.42 mg/dL — ABNORMAL HIGH (ref 0.44–1.00)
GFR calc Af Amer: 44 mL/min — ABNORMAL LOW (ref >60)
GFR calc non Af Amer: 38 mL/min — ABNORMAL LOW (ref >60)
Glucose, Bld: 132 mg/dL — ABNORMAL HIGH (ref 70–99)
Potassium: 4.5 mmol/L (ref 3.5–5.1)
SODIUM: 143 mmol/L (ref 135–145)

## 2018-03-19 LAB — CBC
HCT: 38.2 % (ref 36.0–46.0)
Hemoglobin: 11.6 g/dL — ABNORMAL LOW (ref 12.0–15.0)
MCH: 30.6 pg (ref 26.0–34.0)
MCHC: 30.4 g/dL (ref 30.0–36.0)
MCV: 100.8 fL — ABNORMAL HIGH (ref 80.0–100.0)
NRBC: 0 % (ref 0.0–0.2)
PLATELETS: 294 10*3/uL (ref 150–400)
RBC: 3.79 MIL/uL — ABNORMAL LOW (ref 3.87–5.11)
RDW: 14.6 % (ref 11.5–15.5)
WBC: 9.5 10*3/uL (ref 4.0–10.5)

## 2018-03-19 LAB — LITHIUM LEVEL: Lithium Lvl: 1.19 mmol/L (ref 0.60–1.20)

## 2018-03-19 LAB — MAGNESIUM: Magnesium: 2.2 mg/dL (ref 1.7–2.4)

## 2018-03-19 MED ORDER — PHENTOLAMINE MESYLATE 5 MG IJ SOLR
10.0000 mg | Freq: Once | INTRAMUSCULAR | Status: AC
  Administered 2018-03-19: 10 mg via SUBCUTANEOUS
  Filled 2018-03-19: qty 10

## 2018-03-19 NOTE — Progress Notes (Signed)
Progress Note  Patient Name: Bridget Jackson Date of Encounter: 03/19/2018  Primary Cardiologist: No primary care provider on file.  Subjective   Currently feeling well.  No chest pain or shortness of breath.  Was put on dopamine yesterday and without pauses overnight  Inpatient Medications    Scheduled Meds: . clopidogrel  75 mg Oral Daily  . enoxaparin (LOVENOX) injection  30 mg Subcutaneous Q24H  . ferrous sulfate  325 mg Oral Q breakfast  . fluPHENAZine  4 mg Oral QHS  . multivitamin with minerals  1 tablet Oral Daily  . traZODone  50 mg Oral QHS  . [START ON 03/23/2018] Vitamin D (Ergocalciferol)  50,000 Units Oral Q Wed   Continuous Infusions: . DOPamine 5 mcg/kg/min (03/19/18 0459)  . lactated ringers 50 mL/hr at 03/19/18 0501   PRN Meds: acetaminophen, bisacodyl, docusate sodium, loperamide, ondansetron **OR** ondansetron (ZOFRAN) IV   Vital Signs    Vitals:   03/18/18 1707 03/18/18 1757 03/18/18 1802 03/19/18 0336  BP: (!) 94/53 (!) 109/45 (!) 104/56 (!) 96/59  Pulse: 86 79 78 74  Resp: 20 11 10 12   Temp:    97.9 F (36.6 C)  TempSrc:    Oral  SpO2: 99% 100% 100% 98%  Weight:    81.4 kg    Intake/Output Summary (Last 24 hours) at 03/19/2018 0801 Last data filed at 03/19/2018 0400 Gross per 24 hour  Intake 1639.93 ml  Output 3250 ml  Net -1610.07 ml   Last 3 Weights 03/19/2018 03/18/2018 03/18/2018  Weight (lbs) 179 lb 6.4 oz 182 lb 12.2 oz 182 lb 12.2 oz  Weight (kg) 81.375 kg 82.9 kg 82.9 kg      Telemetry    Sinus rhythm- Personally Reviewed  ECG    None new- Personally Reviewed  Physical Exam   GEN: Well nourished, well developed, in no acute distress  HEENT: normal  Neck: no JVD, carotid bruits, or masses Cardiac: RRR; no murmurs, rubs, or gallops,no edema  Respiratory:  clear to auscultation bilaterally, normal work of breathing GI: soft, nontender, nondistended, + BS MS: no deformity or atrophy  Skin: warm and dry Neuro:  Strength  and sensation are intact Psych: euthymic mood, full affect   Labs    Chemistry Recent Labs  Lab 03/17/18 1023 03/17/18 1034 03/18/18 0341 03/19/18 0552  NA 138 138 141 143  K 5.3* 5.4* 5.0 4.5  CL 113*  --  120* 121*  CO2 17*  --  16* 18*  GLUCOSE 77  --  82 132*  BUN 23  --  17 11  CREATININE 1.80* 1.70* 1.55* 1.42*  CALCIUM 9.9  --  8.6* 9.6  PROT 6.4*  --   --   --   ALBUMIN 3.8  --   --   --   AST 28  --   --   --   ALT 21  --   --   --   ALKPHOS 100  --   --   --   BILITOT 0.5  --   --   --   GFRNONAA 28*  --  34* 38*  GFRAA 33*  --  39* 44*  ANIONGAP 8  --  5 4*     Hematology Recent Labs  Lab 03/17/18 1023 03/17/18 1034 03/18/18 0600 03/19/18 0552  WBC 9.9  --  7.6 9.5  RBC 3.86*  --  3.38* 3.79*  HGB 11.8* 11.6* 10.4* 11.6*  HCT 38.8 34.0* 34.7* 38.2  MCV 100.5*  --  102.7* 100.8*  MCH 30.6  --  30.8 30.6  MCHC 30.4  --  30.0 30.4  RDW 14.6  --  14.8 14.6  PLT 336  --  233 294    Cardiac Enzymes Recent Labs  Lab 03/17/18 1023  TROPONINI <0.03   No results for input(s): TROPIPOC in the last 168 hours.   BNPNo results for input(s): BNP, PROBNP in the last 168 hours.   DDimer No results for input(s): DDIMER in the last 168 hours.   Radiology    Dg Chest 2 View  Result Date: 03/17/2018 CLINICAL DATA:  Lethargy and disorientation for 3 days. EXAM: CHEST - 2 VIEW COMPARISON:  01/15/2018 FINDINGS: Chronic cardiomegaly and aortic tortuosity. Mildly low lung volumes. There is no edema, consolidation, effusion, or pneumothorax. IMPRESSION: No evidence of acute disease. Electronically Signed   By: Monte Fantasia M.D.   On: 03/17/2018 11:23   Ct Head Wo Contrast  Result Date: 03/17/2018 CLINICAL DATA:  Lethargy and disorientation for 3 days. History of hyponatremia. EXAM: CT HEAD WITHOUT CONTRAST TECHNIQUE: Contiguous axial images were obtained from the base of the skull through the vertex without intravenous contrast. COMPARISON:  01/13/2018  FINDINGS: Brain: No evidence of acute infarction, hemorrhage, hydrocephalus, extra-axial collection or mass lesion/mass effect. There is ventricular and sulcal enlargement reflecting mild diffuse atrophy. Mild patchy areas of white matter hypoattenuation are also noted consistent with chronic microvascular ischemic change. Dilated perivascular space in the sub ganglionic region on the left. Vascular: No hyperdense vessel or unexpected calcification. Skull: Normal. Negative for fracture or focal lesion. Sinuses/Orbits: Globes and orbits are unremarkable. Minimal dependent fluid in the right maxillary sinus. Small amount of dependent mucus in the left maxillary sinus. Other: None. IMPRESSION: 1. No acute intracranial abnormalities. 2. Atrophy and mild chronic microvascular ischemic change, stable from the prior study. Electronically Signed   By: Lajean Manes M.D.   On: 03/17/2018 11:45    Cardiac Studies   TTE 01/15/2018 - Left ventricle: The cavity size was normal. Wall thickness was   normal. Systolic function was normal. The estimated ejection   fraction was in the range of 50% to 55%. Doppler parameters are   consistent with abnormal left ventricular relaxation (grade 1   diastolic dysfunction). - Mitral valve: Calcified annulus. - Left atrium: The atrium was mildly dilated.  Patient Profile     68 y.o. female who presented to the hospital with altered mental status found to be lithium toxic with complications of sinus node dysfunction.  Assessment & Plan    1.  Sinus node dysfunction: Secondary to lithium toxicity.  She has had continual sinus pauses of up to 8 seconds yesterday.  She was put on dopamine and has been without pauses since that time.  As her lithium washes out, we Alinda Egolf plan to decrease her dose of dopamine.  Hopefully it Lane Kjos be off tomorrow.  Should she continue to have pauses without high lithium levels, she may require pacemaker implantation.  We Alaiyah Bollman decrease dopamine to 3  today.     For questions or updates, please contact [facility] Please consult [email or web address] for contact info under        Signed, Resa Rinks Meredith Leeds, MD  03/19/2018, 8:01 AM

## 2018-03-19 NOTE — Progress Notes (Signed)
[facility] Progress Note  Patient: Bridget Jackson HMC:[id number]   PCP: Kathyrn Lass, MD DOB: [birth year]   DOA: 03/17/2018   DOS: 03/19/2018   Date of Service: the patient was seen and examined on 03/19/2018  Brief hospital course: Pt. with PMH of schizoaffective disorder, HTN, CVA, OSA; admitted on 03/17/2018, presented with complaint of confusion, was found to have lithium toxicity and resultant bradycardia. Currently further plan is continue current care.  Subjective: No acute complaint.  Started on dopamine infusion last night. Later in the night had IV infiltration. No pain in the arm this morning reported.  Assessment and Plan: Acute metabolic encephalopathy: lithium toxicity.   Lithium level is over 2 times normal and neurologic exam (confusion, lethargy, nystagmus) and history fit with lithium toxicity.   Head CT shows nothing acute.   Lateral T wave inversions on electrocardiogram can be consistent with lithium toxicity.  No reports of any chest pain.   She also has an AKI which is likely contributing. -Hold lithium levels are trending down. -Continue maintenance IV fluids with LR. -Frequent neuro checks -Lithium level elevated secondary to use of amiloride. We will reduce the amiloride dose on discharge.  Severe symptomatic bradycardia. EP was consulted yesterday. Initial plan was to watchful waiting. With her increasing pause patient was started on dopamine drip on 03/18/2018. We will monitor. May require to go to ICU.  Essential Hypertension: Patient is only on amiloride at home.  Given her AKI and hyperkalemia and hypotension on presentation will hold this medication for now.  AKI: Likely prerenal due to decreased oral intake, diarrhea, in the setting of diuretic use -Hold amiloride -Dose all meds for GFR, avoid nephrotoxins Hold IV fluids.  Schizoaffective disorder: -Continue home fluphenazine -Continue home trazodone Sister is against replacing  lithium.  Tells me that patient had been on other mood stabilizing agents in the past and was not able to tolerate them including Lamictal. Tells with a story that during her recent ICU stay after being off of lithium for 1 day patient become psychotic. Recommending her to consult with her primary psychiatrist to consider change in lithium therapy. Discussed with our psychiatry here recommend the same.  History of CVA -Continue Plavix  Diet: Cardiac diet DVT Prophylaxis: subcutaneous Heparin  Advance goals of care discussion: DNR DNI  Family Communication: family was present at bedside, at the time of interview. The pt provided permission to discuss medical plan with the family. Opportunity was given to ask question and all questions were answered satisfactorily.   Disposition:  Discharge to be determined.  Consultants: EP Procedures: none  Scheduled Meds: . clopidogrel  75 mg Oral Daily  . enoxaparin (LOVENOX) injection  30 mg Subcutaneous Q24H  . ferrous sulfate  325 mg Oral Q breakfast  . fluPHENAZine  4 mg Oral QHS  . multivitamin with minerals  1 tablet Oral Daily  . traZODone  50 mg Oral QHS  . [START ON 03/23/2018] Vitamin D (Ergocalciferol)  50,000 Units Oral Q Wed   Continuous Infusions: . DOPamine 3 mcg/kg/min (03/19/18 0817)  . lactated ringers 50 mL/hr at 03/19/18 0501   PRN Meds: acetaminophen, bisacodyl, docusate sodium, loperamide, ondansetron **OR** ondansetron (ZOFRAN) IV Antibiotics: Anti-infectives (From admission, onward)   None       Objective: Physical Exam: Vitals:   03/19/18 1552 03/19/18 1636 03/19/18 1736 03/19/18 1906  BP:  118/60 (!) 110/56 118/74  Pulse: 73 72 69 86  Resp: 11 13 12 16   Temp:  TempSrc:      SpO2: 99% 99% 99% 97%  Weight:        Intake/Output Summary (Last 24 hours) at 03/19/2018 1917 Last data filed at 03/19/2018 1906 Gross per 24 hour  Intake 741.7 ml  Output 5350 ml  Net -4608.3 ml   Filed Weights    03/18/18 0439 03/18/18 0603 03/19/18 0336  Weight: 82.9 kg 82.9 kg 81.4 kg   General: Alert, Awake and Oriented to Time, Place and Person. Appear in moderate distress, affect appropriate Eyes: PERRL, Conjunctiva normal ENT: Oral Mucosa clear moist. Neck: difficult to assess  JVD, no Abnormal Mass Or lumps Cardiovascular: S1 and S2 Present, no Murmur, Peripheral Pulses Present Respiratory: normal respiratory effort, Bilateral Air entry equal and Decreased, no use of accessory muscle, Clear to Auscultation, no Crackles, no wheezes Abdomen: Bowel Sound present, Soft and no tenderness, no hernia Skin: no redness, no Rash, no induration Extremities: bilateral  Pedal edema, no calf tenderness Neurologic: Grossly no focal neuro deficit. Bilaterally Equal motor strength  Data Reviewed: CBC: Recent Labs  Lab 03/17/18 1023 03/17/18 1034 03/18/18 0600 03/19/18 0552  WBC 9.9  --  7.6 9.5  HGB 11.8* 11.6* 10.4* 11.6*  HCT 38.8 34.0* 34.7* 38.2  MCV 100.5*  --  102.7* 100.8*  PLT 336  --  233 222   Basic Metabolic Panel: Recent Labs  Lab 03/17/18 1023 03/17/18 1034 03/18/18 0341 03/19/18 0552  NA 138 138 141 143  K 5.3* 5.4* 5.0 4.5  CL 113*  --  120* 121*  CO2 17*  --  16* 18*  GLUCOSE 77  --  82 132*  BUN 23  --  17 11  CREATININE 1.80* 1.70* 1.55* 1.42*  CALCIUM 9.9  --  8.6* 9.6  MG  --   --   --  2.2    Liver Function Tests: Recent Labs  Lab 03/17/18 1023  AST 28  ALT 21  ALKPHOS 100  BILITOT 0.5  PROT 6.4*  ALBUMIN 3.8   No results for input(s): LIPASE, AMYLASE in the last 168 hours. Recent Labs  Lab 03/17/18 1025  AMMONIA 25   Coagulation Profile: No results for input(s): INR, PROTIME in the last 168 hours. Cardiac Enzymes: Recent Labs  Lab 03/17/18 1023  TROPONINI <0.03   BNP (last 3 results) No results for input(s): PROBNP in the last 8760 hours. CBG: Recent Labs  Lab 03/17/18 1210 03/17/18 1649  GLUCAP 73 72   Studies: No results found.    Time spent: 35 minutes  Author: Berle Mull, MD [facility] 03/19/2018 7:17 PM  To reach On-call, see care teams to locate the attending and reach out to them via [email or web address]. If 7PM-7AM, please contact night-coverage If you still have difficulty reaching the attending provider, please page the [facility] (Director on Call) for [facility] on amion for assistance.

## 2018-03-19 NOTE — Progress Notes (Signed)
Patient's IV got infiltrated, new IV is in.  Per IV team, patient will need phentolamine to protect the tissue around IV from future infiltration, prevent extravasation.  I called pharmacy and spoke to Jeneen Rinks, MD will need to place order for phentolamine.  I have paged MD for this order. I will keep monitoring patient.

## 2018-03-19 NOTE — Progress Notes (Signed)
Spoke with Mickel Baas RN (IV team) regarding phentolamine orders for patient's dopamine infiltration. Mickel Baas spoke with pharmacist, and 10 mg phentolamine was ordered with instructions per pharmacy to dilute in saline and administer in local injections around infiltration site. Infiltration was discovered at 0400, and pharmacy states that phentolamine should be given within 12 hours of infiltration. Dr. Posey Pronto and Dr. Curt Bears were both updated. Will await medication arrival from pharmacy and administer per orders.

## 2018-03-19 NOTE — Progress Notes (Signed)
Rt AC Area of IV Dopamine extravasation cleansed with chlorprep. Regitine 10 mg reconstituted with NS 10 ml

## 2018-03-19 NOTE — Plan of Care (Signed)
  Problem: Health Behavior/Discharge Planning: Goal: Ability to manage health-related needs will improve Outcome: Progressing   Problem: Clinical Measurements: Goal: Ability to maintain clinical measurements within normal limits will improve Outcome: Progressing Goal: Cardiovascular complication will be avoided Outcome: Progressing   

## 2018-03-20 ENCOUNTER — Inpatient Hospital Stay (HOSPITAL_COMMUNITY): Payer: Medicare Other

## 2018-03-20 DIAGNOSIS — T56891A Toxic effect of other metals, accidental (unintentional), initial encounter: Secondary | ICD-10-CM

## 2018-03-20 DIAGNOSIS — I10 Essential (primary) hypertension: Secondary | ICD-10-CM

## 2018-03-20 LAB — BASIC METABOLIC PANEL
ANION GAP: 5 (ref 5–15)
BUN: 11 mg/dL (ref 8–23)
CO2: 20 mmol/L — ABNORMAL LOW (ref 22–32)
Calcium: 9.6 mg/dL (ref 8.9–10.3)
Chloride: 120 mmol/L — ABNORMAL HIGH (ref 98–111)
Creatinine, Ser: 1.37 mg/dL — ABNORMAL HIGH (ref 0.44–1.00)
GFR calc Af Amer: 46 mL/min — ABNORMAL LOW (ref >60)
GFR calc non Af Amer: 40 mL/min — ABNORMAL LOW (ref >60)
Glucose, Bld: 112 mg/dL — ABNORMAL HIGH (ref 70–99)
POTASSIUM: 4.6 mmol/L (ref 3.5–5.1)
Sodium: 145 mmol/L (ref 135–145)

## 2018-03-20 LAB — CBC
HCT: 38.1 % (ref 36.0–46.0)
Hemoglobin: 11.5 g/dL — ABNORMAL LOW (ref 12.0–15.0)
MCH: 30.3 pg (ref 26.0–34.0)
MCHC: 30.2 g/dL (ref 30.0–36.0)
MCV: 100.3 fL — AB (ref 80.0–100.0)
PLATELETS: 284 10*3/uL (ref 150–400)
RBC: 3.8 MIL/uL — ABNORMAL LOW (ref 3.87–5.11)
RDW: 14.7 % (ref 11.5–15.5)
WBC: 8.4 10*3/uL (ref 4.0–10.5)
nRBC: 0 % (ref 0.0–0.2)

## 2018-03-20 LAB — LITHIUM LEVEL: Lithium Lvl: 0.87 mmol/L (ref 0.60–1.20)

## 2018-03-20 MED ORDER — ENOXAPARIN SODIUM 40 MG/0.4ML ~~LOC~~ SOLN
40.0000 mg | SUBCUTANEOUS | Status: DC
  Administered 2018-03-20 – 2018-03-27 (×8): 40 mg via SUBCUTANEOUS
  Filled 2018-03-20 (×8): qty 0.4

## 2018-03-20 MED ORDER — LIP MEDEX EX OINT
1.0000 "application " | TOPICAL_OINTMENT | CUTANEOUS | Status: DC | PRN
  Administered 2018-03-20: 1 via TOPICAL
  Filled 2018-03-20: qty 7

## 2018-03-20 MED ORDER — LITHIUM CARBONATE ER 300 MG PO TBCR
300.0000 mg | EXTENDED_RELEASE_TABLET | Freq: Two times a day (BID) | ORAL | Status: DC
  Administered 2018-03-20 – 2018-03-28 (×16): 300 mg via ORAL
  Filled 2018-03-20 (×16): qty 1

## 2018-03-20 MED ORDER — AMILORIDE HCL 5 MG PO TABS
5.0000 mg | ORAL_TABLET | Freq: Every day | ORAL | Status: DC
  Administered 2018-03-20 – 2018-03-21 (×2): 5 mg via ORAL
  Filled 2018-03-20 (×2): qty 1

## 2018-03-20 NOTE — Progress Notes (Signed)
Progress Note  Patient Name: Bridget Jackson Date of Encounter: 03/20/2018  Primary Cardiologist: No primary care provider on file.   Subjective   Feeling well.  No chest pain, shortness of breath, lightheadedness or dizziness.   Inpatient Medications    Scheduled Meds: . aMILoride  5 mg Oral Daily  . clopidogrel  75 mg Oral Daily  . enoxaparin (LOVENOX) injection  30 mg Subcutaneous Q24H  . ferrous sulfate  325 mg Oral Q breakfast  . fluPHENAZine  4 mg Oral QHS  . lithium carbonate  300 mg Oral Q12H  . multivitamin with minerals  1 tablet Oral Daily  . traZODone  50 mg Oral QHS  . [START ON 03/23/2018] Vitamin D (Ergocalciferol)  50,000 Units Oral Q Wed   Continuous Infusions: . DOPamine 3 mcg/kg/min (03/20/18 0500)   PRN Meds: acetaminophen, bisacodyl, docusate sodium, loperamide, ondansetron **OR** ondansetron (ZOFRAN) IV   Vital Signs    Vitals:   03/20/18 0542 03/20/18 0750 03/20/18 0752 03/20/18 0930  BP:   (!) 93/43 (!) 112/55  Pulse:  76    Resp:  15  (!) 22  Temp:   97.9 F (36.6 C)   TempSrc:   Oral   SpO2:  99% 97%   Weight:      Height: 5\' 3"  (1.6 m)       Intake/Output Summary (Last 24 hours) at 03/20/2018 1142 Last data filed at 03/20/2018 0752 Gross per 24 hour  Intake 1072 ml  Output 4000 ml  Net -2928 ml   Last 3 Weights 03/20/2018 03/19/2018 03/18/2018  Weight (lbs) 177 lb 179 lb 6.4 oz 182 lb 12.2 oz  Weight (kg) 80.287 kg 81.375 kg 82.9 kg      Telemetry    Sinus rhythm.  No events.  - Personally Reviewed  ECG    n/a - Personally Reviewed  Physical Exam   VS:  BP (!) 112/55   Pulse 76   Temp 97.9 F (36.6 C) (Oral)   Resp (!) 22   Ht 5\' 3"  (1.6 m)   Wt 80.3 kg   SpO2 97%   BMI 31.35 kg/m  , BMI Body mass index is 31.35 kg/m. GENERAL:  Chronically ill-appearing. No acute distress. HEENT: Pupils equal round and reactive, fundi not visualized, oral mucosa unremarkable NECK:  No jugular venous distention, waveform within  normal limits, carotid upstroke brisk and symmetric, no bruits LUNGS:  Clear to auscultation bilaterally HEART:  RRR.  PMI not displaced or sustained,S1 and S2 within normal limits, no S3, no S4, no clicks, no rubs,  murmurs ABD:  Flat, positive bowel sounds normal in frequency in pitch, no bruits, no rebound, no guarding, no midline pulsatile mass, no hepatomegaly, no splenomegaly EXT:  2 plus pulses throughout, no edema, no cyanosis no clubbing SKIN:  No rashes no nodules NEURO:  Cranial nerves II through XII grossly intact, motor grossly intact throughout PSYCH:  Cognitively intact, oriented to person place and time.  Answers questions appropriately   Labs    Chemistry Recent Labs  Lab 03/17/18 1023  03/18/18 0341 03/19/18 0552 03/20/18 0456  NA 138   < > 141 143 145  K 5.3*   < > 5.0 4.5 4.6  CL 113*  --  120* 121* 120*  CO2 17*  --  16* 18* 20*  GLUCOSE 77  --  82 132* 112*  BUN 23  --  17 11 11   CREATININE 1.80*   < > 1.55* 1.42* 1.37*  CALCIUM 9.9  --  8.6* 9.6 9.6  PROT 6.4*  --   --   --   --   ALBUMIN 3.8  --   --   --   --   AST 28  --   --   --   --   ALT 21  --   --   --   --   ALKPHOS 100  --   --   --   --   BILITOT 0.5  --   --   --   --   GFRNONAA 28*  --  34* 38* 40*  GFRAA 33*  --  39* 44* 46*  ANIONGAP 8  --  5 4* 5   < > = values in this interval not displayed.     Hematology Recent Labs  Lab 03/18/18 0600 03/19/18 0552 03/20/18 0456  WBC 7.6 9.5 8.4  RBC 3.38* 3.79* 3.80*  HGB 10.4* 11.6* 11.5*  HCT 34.7* 38.2 38.1  MCV 102.7* 100.8* 100.3*  MCH 30.8 30.6 30.3  MCHC 30.0 30.4 30.2  RDW 14.8 14.6 14.7  PLT 233 294 284    Cardiac Enzymes Recent Labs  Lab 03/17/18 1023  TROPONINI <0.03   No results for input(s): TROPIPOC in the last 168 hours.   BNPNo results for input(s): BNP, PROBNP in the last 168 hours.   DDimer No results for input(s): DDIMER in the last 168 hours.   Radiology    No results found.  Cardiac Studies   Echo  01/15/18:  Study Conclusions  - Left ventricle: The cavity size was normal. Wall thickness was   normal. Systolic function was normal. The estimated ejection   fraction was in the range of 50% to 55%. Doppler parameters are   consistent with abnormal left ventricular relaxation (grade 1   diastolic dysfunction). - Mitral valve: Calcified annulus. - Left atrium: The atrium was mildly dilated.  Patient Profile     68 y.o. female with schizoaffective disorder, hypertension and hyperlipidemia here with symptomatic bradycardia in the setting of lithium toxicity.  Assessment & Plan    # Lithium toxicity: # Bradycardia: No recurrent events in the last 24 hours.  Will stop dopamine and monitor on telemetry.       For questions or updates, please contact [place] Please consult [email or web address] for contact info under        Signed, Skeet Latch, MD  03/20/2018, 11:42 AM

## 2018-03-20 NOTE — Progress Notes (Signed)
[facility] Progress Note  Patient: Bridget Jackson YWV:[id number]   PCP: Kathyrn Lass, MD DOB: 12/22/1950   DOA: 03/17/2018   DOS: 03/20/2018   Date of Service: the patient was seen and examined on 03/20/2018  Brief hospital course: Pt. with PMH of schizoaffective disorder, HTN, CVA, OSA; admitted on 03/17/2018, presented with complaint of confusion, was found to have lithium toxicity and resultant bradycardia. Currently further plan is continue current care.  Subjective: No nausea no vomiting.  No pain in the arm.  No fever no chills.  No diarrhea.  Assessment and Plan: Acute metabolic encephalopathy: lithium toxicity.   Lithium level is over 2 times normal and neurologic exam (confusion, lethargy, nystagmus) and history fit with lithium toxicity.   Head CT shows nothing acute.   Lateral T wave inversions on electrocardiogram can be consistent with lithium toxicity.  No reports of any chest pain.   She also has an AKI which is likely contributing. -Lithium levels are trending down and now back to low normal range.  Will resume lithium at home dose today. -We will hold off on the IV fluids for now. -Frequent neuro checks -Lithium level elevated secondary to use of amiloride. We will reduce the amiloride dose on discharge.  Severe symptomatic bradycardia. EP was consulted yesterday. Initial plan was to watchful waiting. With her increasing pause patient was started on dopamine drip on 03/18/2018. Currently stopped.  Monitor.  Essential Hypertension: Patient is only on amiloride at home.  Given her AKI and hyperkalemia and hypotension on presentation will hold this medication for now.  AKI: Likely prerenal due to decreased oral intake, diarrhea, in the setting of diuretic use -Hold amiloride -Dose all meds for GFR, avoid nephrotoxins Hold IV fluids.  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 Patient was started on amiloride last admission. We will reduce the dose of the amiloride to 5 mg daily. Probable home dose for discharge would be 10 mg daily.  Schizoaffective disorder: -Continue home fluphenazine -Continue home trazodone Sister is against replacing lithium.  Tells me that patient had been on other mood stabilizing agents in the past and was not able to tolerate them including Lamictal. Tells with a story that during her recent ICU stay after being off of lithium for 1 day patient become psychotic. Recommending her to consult with her primary psychiatrist to consider change in lithium therapy. Discussed with our psychiatry here recommend the same.  History of CVA -Continue Plavix  Diet: Cardiac diet DVT Prophylaxis: subcutaneous Heparin  Advance goals of care discussion: DNR DNI  Family Communication: family was present at bedside, at the time of interview. The pt provided permission to discuss medical plan with the family. Opportunity was given to ask question and all questions were answered satisfactorily.   Disposition:  Discharge to be determined.  Consultants: EP Procedures: none  Scheduled Meds: . aMILoride  5 mg Oral Daily  . clopidogrel  75 mg Oral Daily  . enoxaparin (LOVENOX) injection  40 mg Subcutaneous Q24H  . ferrous sulfate  325 mg Oral Q breakfast  . fluPHENAZine  4 mg Oral QHS  . lithium carbonate  300 mg Oral Q12H  . multivitamin with minerals  1 tablet Oral Daily  . traZODone  50 mg Oral QHS  . [START ON 03/23/2018] Vitamin D (Ergocalciferol)  50,000 Units Oral Q Wed   Continuous Infusions:  PRN Meds: acetaminophen, bisacodyl, docusate sodium, loperamide, ondansetron **OR** ondansetron (ZOFRAN)  IV Antibiotics: Anti-infectives (From admission, onward)   None       Objective: Physical Exam: Vitals:   03/20/18 0750 03/20/18 0752 03/20/18 0930 03/20/18 1259  BP:  (!) 93/43 (!) 112/55 (!) 99/44  Pulse: 76   71  Resp: 15   (!) 22   Temp:  97.9 F (36.6 C)  97.8 F (36.6 C)  TempSrc:  Oral  Oral  SpO2: 99% 97%  99%  Weight:      Height:        Intake/Output Summary (Last 24 hours) at 03/20/2018 1654 Last data filed at 03/20/2018 1243 Gross per 24 hour  Intake 1107.98 ml  Output 3450 ml  Net -2342.02 ml   Filed Weights   03/18/18 0603 03/19/18 0336 03/20/18 0456  Weight: 82.9 kg 81.4 kg 80.3 kg   General: Alert, Awake and Oriented to Time, Place and Person. Appear in moderate distress, affect appropriate Eyes: PERRL, Conjunctiva normal ENT: Oral Mucosa clear moist. Neck: difficult to assess  JVD, no Abnormal Mass Or lumps Cardiovascular: S1 and S2 Present, no Murmur, Peripheral Pulses Present Respiratory: normal respiratory effort, Bilateral Air entry equal and Decreased, no use of accessory muscle, Clear to Auscultation, no Crackles, no wheezes Abdomen: Bowel Sound present, Soft and no tenderness, no hernia Skin: no redness, no Rash, no induration Extremities: bilateral  Pedal edema, no calf tenderness Neurologic: Grossly no focal neuro deficit. Bilaterally Equal motor strength  Data Reviewed: CBC: Recent Labs  Lab 03/17/18 1023 03/17/18 1034 03/18/18 0600 03/19/18 0552 03/20/18 0456  WBC 9.9  --  7.6 9.5 8.4  HGB 11.8* 11.6* 10.4* 11.6* 11.5*  HCT 38.8 34.0* 34.7* 38.2 38.1  MCV 100.5*  --  102.7* 100.8* 100.3*  PLT 336  --  233 294 161   Basic Metabolic Panel: Recent Labs  Lab 03/17/18 1023 03/17/18 1034 03/18/18 0341 03/19/18 0552 03/20/18 0456  NA 138 138 141 143 145  K 5.3* 5.4* 5.0 4.5 4.6  CL 113*  --  120* 121* 120*  CO2 17*  --  16* 18* 20*  GLUCOSE 77  --  82 132* 112*  BUN 23  --  17 11 11   CREATININE 1.80* 1.70* 1.55* 1.42* 1.37*  CALCIUM 9.9  --  8.6* 9.6 9.6  MG  --   --   --  2.2  --     Liver Function Tests: Recent Labs  Lab 03/17/18 1023  AST 28  ALT 21  ALKPHOS 100  BILITOT 0.5  PROT 6.4*  ALBUMIN 3.8   No results for input(s): LIPASE, AMYLASE  in the last 168 hours. Recent Labs  Lab 03/17/18 1025  AMMONIA 25   Coagulation Profile: No results for input(s): INR, PROTIME in the last 168 hours. Cardiac Enzymes: Recent Labs  Lab 03/17/18 1023  TROPONINI <0.03   BNP (last 3 results) No results for input(s): PROBNP in the last 8760 hours. CBG: Recent Labs  Lab 03/17/18 1210 03/17/18 1649  GLUCAP 73 72   Studies: Dg Chest Port 1 View  Result Date: 03/20/2018 CLINICAL DATA:  Dyspnea EXAM: PORTABLE CHEST 1 VIEW COMPARISON:  03/17/2018 chest radiograph. FINDINGS: Stable cardiomediastinal silhouette with normal heart size. No pneumothorax. No pleural effusion. Lungs appear clear, with no acute consolidative airspace disease and no pulmonary edema. IMPRESSION: No active disease. Electronically Signed   By: Ilona Sorrel M.D.   On: 03/20/2018 16:15     Time spent: 35 minutes  Author: Berle Mull, MD [facility] 03/20/2018 4:54  PM  To reach On-call, see care teams to locate the attending and reach out to them via [email or web address]. If 7PM-7AM, please contact night-coverage If you still have difficulty reaching the attending provider, please page the [facility] (Director on Call) for [facility] on amion for assistance.

## 2018-03-21 DIAGNOSIS — G9341 Metabolic encephalopathy: Principal | ICD-10-CM

## 2018-03-21 LAB — BASIC METABOLIC PANEL
Anion gap: 5 (ref 5–15)
BUN: 14 mg/dL (ref 8–23)
CO2: 21 mmol/L — ABNORMAL LOW (ref 22–32)
Calcium: 9 mg/dL (ref 8.9–10.3)
Chloride: 118 mmol/L — ABNORMAL HIGH (ref 98–111)
Creatinine, Ser: 1.4 mg/dL — ABNORMAL HIGH (ref 0.44–1.00)
GFR calc Af Amer: 45 mL/min — ABNORMAL LOW (ref >60)
GFR calc non Af Amer: 39 mL/min — ABNORMAL LOW (ref >60)
Glucose, Bld: 100 mg/dL — ABNORMAL HIGH (ref 70–99)
Potassium: 5.6 mmol/L — ABNORMAL HIGH (ref 3.5–5.1)
SODIUM: 144 mmol/L (ref 135–145)

## 2018-03-21 LAB — CBC
HCT: 37 % (ref 36.0–46.0)
HEMOGLOBIN: 11.2 g/dL — AB (ref 12.0–15.0)
MCH: 30.6 pg (ref 26.0–34.0)
MCHC: 30.3 g/dL (ref 30.0–36.0)
MCV: 101.1 fL — ABNORMAL HIGH (ref 80.0–100.0)
Platelets: 262 10*3/uL (ref 150–400)
RBC: 3.66 MIL/uL — ABNORMAL LOW (ref 3.87–5.11)
RDW: 14.8 % (ref 11.5–15.5)
WBC: 7.3 10*3/uL (ref 4.0–10.5)
nRBC: 0 % (ref 0.0–0.2)

## 2018-03-21 LAB — LITHIUM LEVEL: LITHIUM LVL: 0.89 mmol/L (ref 0.60–1.20)

## 2018-03-21 NOTE — Care Management Important Message (Signed)
Important Message  Patient Details  Name: Bridget Jackson MRN: [id number] Date of Birth: 07-Jul-1950   Medicare Important Message Given:  Yes    Rishard Delange P Sherrye Puga 03/21/2018, 4:30 PM

## 2018-03-21 NOTE — Clinical Social Work Note (Signed)
Clinical Social Work Assessment  Patient Details  Name: Bridget Jackson MRN: [id number] Date of Birth: May 09, 1950  Date of referral:  03/21/18               Reason for consult:  Facility Placement, Discharge Planning                Permission sought to share information with:  Facility Sport and exercise psychologist, Family Supports Permission granted to share information::  Yes, Verbal Permission Granted  Name::     Bridget Jackson  Agency::  [facility]  Relationship::  sister  Contact Information:  [phone]  Housing/Transportation Living arrangements for the past 2 months:  [place] of Information:  Patient, Siblings Patient Interpreter Needed:  None Criminal Activity/Legal Involvement Pertinent to Current Situation/Hospitalization:  No - Comment as needed Significant Relationships:  Siblings Lives with:  Self Do you feel safe going back to the place where you live?  Yes Need for family participation in patient care:  Yes (Comment)  Care giving concerns: Patient from [facility]. PT recommending SNF.   Social Worker assessment / plan: CSW met with patient at bedside. Patient alert and oriented. CSW introduced self and role and discussed disposition planning - PT recommendation for SNF.  Patient hesistant about SNF. She went to [facility] recently and does not want to return there. She prefers to go home to her ILF. She states that her sister Bridget Jackson will help her. Patient gave permission to discuss discharge planning with Bridget Jackson.  CSW spoke to [place] on the phone. She expects patient may need long term care eventually, but would like patient to stay at her ILF as long as possible. She would like patient to go to rehab again, and will discuss the need for rehab with patient. Sister prefers [facility], or [facility]. CSW sent out initial referrals, awaiting bed offers.  Patient's PASRR is pending due to mental health history. Will need PASRR  approved before patient can admit to a facility.  Will provide CMS SNF list and bed offers when available.  Employment status:  Disabled (Comment on whether or not currently receiving Disability) Insurance information:  Medicare PT Recommendations:  [place] / Referral to community resources:  [place]  Patient/Family's Response to care: Patient and sister appreciative of care.  Patient/Family's Understanding of and Emotional Response to Diagnosis, Current Treatment, and Prognosis: Patient's sister with good understanding of patient's conditions and agreeable to SNF.  Emotional Assessment Appearance:  Appears stated age Attitude/Demeanor/Rapport:  Engaged Affect (typically observed):  Calm, Appropriate Orientation:  Oriented to Self, Oriented to Place, Oriented to  Time, Oriented to Situation Alcohol / Substance use:  Not Applicable Psych involvement (Current and /or in the community):  No (Comment)  Discharge Needs  Concerns to be addressed:  Discharge Planning Concerns, Care Coordination Readmission within the last 30 days:  No Current discharge risk:  Physical Impairment Barriers to Discharge:  Continued Medical Work up, Programmer, applications ([place])   Estanislado Emms, LCSW 03/21/2018, 12:39 PM

## 2018-03-21 NOTE — Progress Notes (Signed)
[facility] Progress Note  Patient: Bridget Jackson [id number]:[id number]   PCP: Kathyrn Lass, MD DOB: Jul 08, 1950   DOA: 03/17/2018   DOS: 03/21/2018   Date of Service: the patient was seen and examined on 03/21/2018  Brief hospital course: Pt. with PMH of schizoaffective disorder, HTN, CVA, OSA; admitted on 03/17/2018, presented with complaint of confusion, was found to have lithium toxicity and resultant bradycardia. Currently further plan is continue current care.  Subjective: no shortnes of breath, chest pain, no fever.   Assessment and Plan: Acute metabolic encephalopathy: lithium toxicity.   Lithium level is over 2 times normal and neurologic exam (confusion, lethargy, nystagmus) and history fit with lithium toxicity.   Head CT shows nothing acute.   Lateral T wave inversions on electrocardiogram can be consistent with lithium toxicity.  No reports of any chest pain.   She also has an AKI which is likely contributing. -Lithium levels are trending down and now back to low normal range.  Will resume lithium at home dose. -We will hold off on the IV fluids for now. -Frequent neuro checks -Lithium level elevated secondary to use of amiloride. -We will stop the amiloride due to hyperkalemia   Severe symptomatic bradycardia. EP was consulted yesterday. Initial plan was to watchful waiting. With her increasing pause patient was started on dopamine drip on 03/18/2018. Currently stopped.  Monitor.  Essential Hypertension: Patient is only on amiloride at home.  Given her AKI and hyperkalemia and hypotension on presentation will hold this medication for now.  AKI: Likely prerenal due to decreased oral intake, diarrhea, in the setting of diuretic use -Hold amiloride -Dose all meds for GFR, avoid nephrotoxins Hold IV fluids.  Nephrogenic diabetes insipidus. 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 Patient was started on  amiloride last admission. We will reduce the dose of the amiloride to 5 mg daily. Probable home dose for discharge would be 10 mg daily.  Schizoaffective disorder: -Continue home fluphenazine -Continue home trazodone Sister is against replacing lithium.  Tells me that patient had been on other mood stabilizing agents in the past and was not able to tolerate them including Lamictal. Tells with a story that during her recent ICU stay after being off of lithium for 1 day patient become psychotic. Recommending her to consult with her primary psychiatrist to consider change in lithium therapy. Discussed with our psychiatry here recommend the same.  History of CVA -Continue Plavix  Diet: Cardiac diet DVT Prophylaxis: subcutaneous Heparin  Advance goals of care discussion: DNR DNI  Family Communication: family was present at bedside, at the time of interview. The pt provided permission to discuss medical plan with the family. Opportunity was given to ask question and all questions were answered satisfactorily.   Disposition:  Discharge to be determined.  Consultants: EP Procedures: none  Scheduled Meds: . clopidogrel  75 mg Oral Daily  . enoxaparin (LOVENOX) injection  40 mg Subcutaneous Q24H  . ferrous sulfate  325 mg Oral Q breakfast  . fluPHENAZine  4 mg Oral QHS  . lithium carbonate  300 mg Oral Q12H  . multivitamin with minerals  1 tablet Oral Daily  . traZODone  50 mg Oral QHS  . [START ON 03/23/2018] Vitamin D (Ergocalciferol)  50,000 Units Oral Q Wed   Continuous Infusions:  PRN Meds: acetaminophen, bisacodyl, docusate sodium, lip balm, loperamide, ondansetron **OR** ondansetron (ZOFRAN) IV Antibiotics: Anti-infectives (From admission, onward)   None  Objective: Physical Exam: Vitals:   03/20/18 1703 03/20/18 2039 03/21/18 0537 03/21/18 2047  BP: (!) 95/52 105/69 (!) 91/50 107/65  Pulse: 63 63 67 70  Resp:  (!) 25 10 20   Temp: 98.3 F (36.8 C) 97.9 F  (36.6 C) 97.9 F (36.6 C) 97.7 F (36.5 C)  TempSrc: Oral Oral Oral Oral  SpO2: 100% 99% 97% 99%  Weight:   78.9 kg   Height:        Intake/Output Summary (Last 24 hours) at 03/21/2018 2101 Last data filed at 03/21/2018 0650 Gross per 24 hour  Intake 240 ml  Output 1080 ml  Net -840 ml   Filed Weights   03/19/18 0336 03/20/18 0456 03/21/18 0537  Weight: 81.4 kg 80.3 kg 78.9 kg   General: Alert, Awake and Oriented to Time, Place and Person. Appear in moderate distress, affect appropriate Eyes: PERRL, Conjunctiva normal ENT: Oral Mucosa clear moist. Neck: difficult to assess  JVD, no Abnormal Mass Or lumps Cardiovascular: S1 and S2 Present, no Murmur, Peripheral Pulses Present Respiratory: normal respiratory effort, Bilateral Air entry equal and Decreased, no use of accessory muscle, Clear to Auscultation, no Crackles, no wheezes Abdomen: Bowel Sound present, Soft and no tenderness, no hernia Skin: no redness, no Rash, no induration Extremities: bilateral  Pedal edema, no calf tenderness Neurologic: Grossly no focal neuro deficit. Bilaterally Equal motor strength  Data Reviewed: CBC: Recent Labs  Lab 03/17/18 1023 03/17/18 1034 03/18/18 0600 03/19/18 0552 03/20/18 0456 03/21/18 0638  WBC 9.9  --  7.6 9.5 8.4 7.3  HGB 11.8* 11.6* 10.4* 11.6* 11.5* 11.2*  HCT 38.8 34.0* 34.7* 38.2 38.1 37.0  MCV 100.5*  --  102.7* 100.8* 100.3* 101.1*  PLT 336  --  233 294 284 381   Basic Metabolic Panel: Recent Labs  Lab 03/17/18 1023 03/17/18 1034 03/18/18 0341 03/19/18 0552 03/20/18 0456 03/21/18 0638  NA 138 138 141 143 145 144  K 5.3* 5.4* 5.0 4.5 4.6 5.6*  CL 113*  --  120* 121* 120* 118*  CO2 17*  --  16* 18* 20* 21*  GLUCOSE 77  --  82 132* 112* 100*  BUN 23  --  17 11 11 14   CREATININE 1.80* 1.70* 1.55* 1.42* 1.37* 1.40*  CALCIUM 9.9  --  8.6* 9.6 9.6 9.0  MG  --   --   --  2.2  --   --     Liver Function Tests: Recent Labs  Lab 03/17/18 1023  AST 28  ALT 21   ALKPHOS 100  BILITOT 0.5  PROT 6.4*  ALBUMIN 3.8   No results for input(s): LIPASE, AMYLASE in the last 168 hours. Recent Labs  Lab 03/17/18 1025  AMMONIA 25   Coagulation Profile: No results for input(s): INR, PROTIME in the last 168 hours. Cardiac Enzymes: Recent Labs  Lab 03/17/18 1023  TROPONINI <0.03   BNP (last 3 results) No results for input(s): PROBNP in the last 8760 hours. CBG: Recent Labs  Lab 03/17/18 1210 03/17/18 1649  GLUCAP 73 72   Studies: No results found.   Time spent: 35 minutes  Author: Berle Mull, MD [facility] 03/21/2018 9:01 PM  To reach On-call, see care teams to locate the attending and reach out to them via [email or web address]. If 7PM-7AM, please contact night-coverage If you still have difficulty reaching the attending provider, please page the [facility] (Director on Call) for [facility] on amion for assistance.

## 2018-03-21 NOTE — Evaluation (Signed)
Physical Therapy Evaluation Patient Details Name: Bridget Jackson MRN: [id number] DOB: December 18, 1950 Today's Date: 03/21/2018   History of Present Illness  68 yo female with onset of metabolic encephalopathy was admitted and noted lithium toxicity, bradycardia, AKI but cleared from chest xray.  PMHx:  schizoaffective disorder, CVA, HTN, OSA,   Clinical Impression  Pt was seen for mobility and strength testing, noting her lethargic presentation due to recent medicine issues.  Talked with nursing about her slow progression of following instructions, which nursing has experienced as well.  Follow acutely for strengthening, mobility and balance training, but recommending SNF due to the dramatic decline in independent mobility noted from her previous level of mobility. Safety education for pt to recall asking for help as needed, including to ask for help when she is wet or in need of assistance to use BSC.     Follow Up Recommendations SNF    Equipment Recommendations  None recommended by PT    Recommendations for Other Services       Precautions / Restrictions Precautions Precautions: Fall Restrictions Weight Bearing Restrictions: No      Mobility  Bed Mobility Overal bed mobility: Needs Assistance Bed Mobility: Supine to Sit     Supine to sit: Min assist;HOB elevated     General bed mobility comments: assisting trunk and then cued for finishing scooting to EOB  Transfers Overall transfer level: Needs assistance Equipment used: Rolling walker (2 wheeled);1 person hand held assist Transfers: Sit to/from Stand Sit to Stand: Mod assist         General transfer comment: mod to power up then to steady is min at RW before she can side step to chair  Ambulation/Gait Ambulation/Gait assistance: Min assist Gait Distance (Feet): 4 Feet Assistive device: Rolling walker (2 wheeled);1 person hand held assist Gait Pattern/deviations: Step-to pattern;Decreased stride length;Wide base  of support;Trunk flexed Gait velocity: redcued Gait velocity interpretation: <1.8 ft/sec, indicate of risk for recurrent falls General Gait Details: pt is slow to step as she seems reluctant to WB on LLE  Stairs            Wheelchair Mobility    Modified Rankin (Stroke Patients Only) Modified Rankin (Stroke Patients Only) Pre-Morbid Rankin Score: Slight disability Modified Rankin: Moderately severe disability     Balance Overall balance assessment: Needs assistance Sitting-balance support: Feet supported;Bilateral upper extremity supported Sitting balance-Leahy Scale: Fair     Standing balance support: Bilateral upper extremity supported;During functional activity Standing balance-Leahy Scale: Poor                               Pertinent Vitals/Pain Pain Assessment: No/denies pain    Home Living Family/patient expects to be discharged to:: Private residence Living Arrangements: Other (Comment)(ALF) Available Help at Discharge: Family;Available 24 hours/day;Available PRN/intermittently Type of Home: Assisted living Home Access: Level entry     Home Layout: One level Home Equipment: Walker - 2 wheels;Walker - 4 wheels;Shower seat - built in;Grab bars - tub/shower;Grab bars - toilet;Toilet riser;Wheelchair - manual;Cane - single point Additional Comments: (ALF with pt previously being a rollator independent ambulato)    Prior Function Level of Independence: Needs assistance   Gait / Transfers Assistance Needed: rollator in ALF, wc for going out with family  ADL's / Homemaking Assistance Needed: staff to assist all adl's and         Hand Dominance   Dominant Hand: Right    Extremity/Trunk Assessment  Upper Extremity Assessment Upper Extremity Assessment: Generalized weakness    Lower Extremity Assessment Lower Extremity Assessment: Generalized weakness    Cervical / Trunk Assessment Cervical / Trunk Assessment: Normal  Communication       Cognition Arousal/Alertness: Awake/alert Behavior During Therapy: Flat affect Overall Cognitive Status: Impaired/Different from baseline Area of Impairment: Safety/judgement;Awareness;Problem solving;Following commands;Memory;Attention                   Current Attention Level: Alternating Memory: Decreased recall of precautions Following Commands: Follows one step commands inconsistently;Follows one step commands with increased time Safety/Judgement: Decreased awareness of safety;Decreased awareness of deficits Awareness: Intellectual Problem Solving: Slow processing;Decreased initiation;Requires verbal cues;Requires tactile cues General Comments: pt is still moving slowly and slow to follow mm testing commands      General Comments General comments (skin integrity, edema, etc.): pt is up to walk with PT for short trip but was walking hallway distances on a rollator prior to this admission.    Exercises     Assessment/Plan    PT Assessment Patient needs continued PT services  PT Problem List Decreased strength;Decreased range of motion;Decreased activity tolerance;Decreased balance;Decreased mobility;Decreased coordination;Decreased cognition;Decreased knowledge of use of DME;Decreased safety awareness;Cardiopulmonary status limiting activity       PT Treatment Interventions DME instruction;Gait training;Functional mobility training;Therapeutic activities;Therapeutic exercise;Balance training;Neuromuscular re-education;Patient/family education    PT Goals (Current goals can be found in the Care Plan section)  Acute Rehab PT Goals Patient Stated Goal: to get stronger PT Goal Formulation: With patient Time For Goal Achievement: 04/04/18 Potential to Achieve Goals: Good    Frequency Min 2X/week   Barriers to discharge Decreased caregiver support lives in ALF with pt typically being self ambulatory    Co-evaluation               AM-PAC PT "6 Clicks" Mobility   Outcome Measure Help needed turning from your back to your side while in a flat bed without using bedrails?: A Little Help needed moving from lying on your back to sitting on the side of a flat bed without using bedrails?: A Little Help needed moving to and from a bed to a chair (including a wheelchair)?: A Little Help needed standing up from a chair using your arms (e.g., wheelchair or bedside chair)?: A Lot Help needed to walk in hospital room?: A Little Help needed climbing 3-5 steps with a railing? : Total 6 Click Score: 15    End of Session Equipment Utilized During Treatment: Gait belt Activity Tolerance: Patient tolerated treatment well;Patient limited by fatigue;Treatment limited secondary to medical complications (Comment) Patient left: in chair;with call bell/phone within reach;with chair alarm set Nurse Communication: Mobility status;Other (comment)(talked with nursing about dc planning) PT Visit Diagnosis: Unsteadiness on feet (R26.81);Muscle weakness (generalized) (M62.81);Difficulty in walking, not elsewhere classified (R26.2)    Time: 9518-8416 PT Time Calculation (min) (ACUTE ONLY): 28 min   Charges:   PT Evaluation $PT Eval Moderate Complexity: 1 Mod PT Treatments $Gait Training: 8-22 mins       Ramond Dial 03/21/2018, 10:55 AM   Mee Hives, PT MS Acute Rehab Dept. Number: [name] and [place]

## 2018-03-21 NOTE — Progress Notes (Addendum)
Progress Note  Patient Name: Bridget Jackson Date of Encounter: 03/21/2018  Primary Cardiologist: No primary care provider on file.  Subjective   No complaints, denies CP or SOB  Inpatient Medications    Scheduled Meds: . aMILoride  5 mg Oral Daily  . clopidogrel  75 mg Oral Daily  . enoxaparin (LOVENOX) injection  40 mg Subcutaneous Q24H  . ferrous sulfate  325 mg Oral Q breakfast  . fluPHENAZine  4 mg Oral QHS  . lithium carbonate  300 mg Oral Q12H  . multivitamin with minerals  1 tablet Oral Daily  . traZODone  50 mg Oral QHS  . [START ON 03/23/2018] Vitamin D (Ergocalciferol)  50,000 Units Oral Q Wed   Continuous Infusions:  PRN Meds: acetaminophen, bisacodyl, docusate sodium, lip balm, loperamide, ondansetron **OR** ondansetron (ZOFRAN) IV   Vital Signs    Vitals:   03/20/18 1259 03/20/18 1703 03/20/18 2039 03/21/18 0537  BP: (!) 99/44 (!) 95/52 105/69 (!) 91/50  Pulse: 71 63 63 67  Resp:   (!) 25 10  Temp: 97.8 F (36.6 C) 98.3 F (36.8 C) 97.9 F (36.6 C) 97.9 F (36.6 C)  TempSrc: Oral Oral Oral Oral  SpO2: 99% 100% 99% 97%  Weight:    78.9 kg  Height:        Intake/Output Summary (Last 24 hours) at 03/21/2018 0950 Last data filed at 03/21/2018 0650 Gross per 24 hour  Intake 275.98 ml  Output 2480 ml  Net -2204.02 ml   Last 3 Weights 03/21/2018 03/20/2018 03/19/2018  Weight (lbs) 173 lb 14.4 oz 177 lb 179 lb 6.4 oz  Weight (kg) 78.881 kg 80.287 kg 81.375 kg      Telemetry    SR 60's-80's, no pauses or bradycardia  ECG    None new- Personally Reviewed  Physical Exam   GEN: No acute distress.   Neck: No JVD Cardiac: RRR, no murmurs, rubs, or gallops.  Respiratory: CTA b/l. GI: Soft, nontender, non-distended  MS: No edema; No deformity. Neuro:  Nonfocal  Psych: flat affect, but less so, pleasent  Labs    Chemistry Recent Labs  Lab 03/17/18 1023  03/19/18 0552 03/20/18 0456 03/21/18 0638  NA 138   < > 143 145 144  K 5.3*   < >  4.5 4.6 5.6*  CL 113*   < > 121* 120* 118*  CO2 17*   < > 18* 20* 21*  GLUCOSE 77   < > 132* 112* 100*  BUN 23   < > 11 11 14   CREATININE 1.80*   < > 1.42* 1.37* 1.40*  CALCIUM 9.9   < > 9.6 9.6 9.0  PROT 6.4*  --   --   --   --   ALBUMIN 3.8  --   --   --   --   AST 28  --   --   --   --   ALT 21  --   --   --   --   ALKPHOS 100  --   --   --   --   BILITOT 0.5  --   --   --   --   GFRNONAA 28*   < > 38* 40* 39*  GFRAA 33*   < > 44* 46* 45*  ANIONGAP 8   < > 4* 5 5   < > = values in this interval not displayed.     Hematology Recent Labs  Lab 03/19/18 [phone]  03/20/18 0456 03/21/18 0638  WBC 9.5 8.4 7.3  RBC 3.79* 3.80* 3.66*  HGB 11.6* 11.5* 11.2*  HCT 38.2 38.1 37.0  MCV 100.8* 100.3* 101.1*  MCH 30.6 30.3 30.6  MCHC 30.4 30.2 30.3  RDW 14.6 14.7 14.8  PLT 294 284 262    Cardiac Enzymes Recent Labs  Lab 03/17/18 1023  TROPONINI <0.03   No results for input(s): TROPIPOC in the last 168 hours.   BNPNo results for input(s): BNP, PROBNP in the last 168 hours.   DDimer No results for input(s): DDIMER in the last 168 hours.   Radiology    Dg Chest Port 1 View Result Date: 03/20/2018 CLINICAL DATA:  Dyspnea EXAM: PORTABLE CHEST 1 VIEW COMPARISON:  03/17/2018 chest radiograph. FINDINGS: Stable cardiomediastinal silhouette with normal heart size. No pneumothorax. No pleural effusion. Lungs appear clear, with no acute consolidative airspace disease and no pulmonary edema. IMPRESSION: No active disease. Electronically Signed   By: Ilona Sorrel M.D.   On: 03/20/2018 16:15    Cardiac Studies   TTE 01/15/2018 - Left ventricle: The cavity size was normal. Wall thickness was   normal. Systolic function was normal. The estimated ejection   fraction was in the range of 50% to 55%. Doppler parameters are   consistent with abnormal left ventricular relaxation (grade 1   diastolic dysfunction). - Mitral valve: Calcified annulus. - Left atrium: The atrium was mildly  dilated.  Patient Profile     68 y.o. female who presented to the hospital with altered mental status found to be lithium toxic with complications of sinus node dysfunction.  Assessment & Plan    1.  Sinus node dysfunction 2/2 lithium toxicity      Off dopamine since yesterday mid-daywithout any further sinus pauses      Lithium tox is resolved   EP will sign off though remain available No recommendations outside of avoidance of lithium toxicity No need for EP follow up Please recall if needed      For questions or updates, please contact [facility] Please consult [email or web address] for contact info under        Signed, Baldwin [place], PA-C  03/21/2018, 9:50 AM    EP attending  Patient seen and examined.  Agree with the findings as noted above.  The patient's lithium toxicity has finally resolved and her heart rate has stabilized.  Would recommend continuing lithium therapy maintaining therapeutic rather than toxic concentrations.  No additional EP follow-up is required at this point.  Cristopher Peru, MD

## 2018-03-21 NOTE — NC FL2 (Addendum)
[place] LEVEL OF CARE SCREENING TOOL     IDENTIFICATION  Patient Name: Bridget Jackson Birthdate: Jul 04, 1950 Sex: female Admission Date (Current Location): 03/17/2018  [facility] and [place] Number:  Herbalist and Address:  [facility] [place], [place] [address], [place], [place] [address]      Provider Number: [id number]  Attending Physician Name and Address:  Lavina Hamman, MD  Relative Name and Phone Number:       Current Level of Care: Hospital Recommended Level of Care: [place] Prior Approval Number:    Date Approved/Denied:   PASRR Number:   [id number] F (2/24 - 04/27/18)  Discharge Plan: SNF    Current Diagnoses: Patient Active Problem List   Diagnosis Date Noted  . Metabolic encephalopathy 73/41/9379  . T wave inversion in electrocardiogram 03/17/2018  . Lithium toxicity 03/17/2018  . Nystagmus 03/17/2018  . AKI (acute kidney injury) ([place]) 03/17/2018  . Normal anion gap metabolic acidosis 02/40/9735  . Hyperkalemia 03/17/2018  . Altered mental status   . Hypotension   . 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 CPAP ventilation treatment not tolerated 11/17/2017  . Excessive daytime sleepiness 11/05/2017  . Severe sleep apnea 11/05/2017  . Left shoulder pain 08/31/2017  . Pyuria 08/30/2017  . Debility 08/30/2017  . Schizoaffective disorder ([place]) 08/30/2017  . Hyperlipidemia 08/30/2017  . Essential hypertension 08/30/2017    Orientation RESPIRATION BLADDER Height & Weight     Self, Time, Situation, Place  Normal Incontinent, External catheter Weight: 78.9 kg Height:  5\' 3"  (160 cm)  BEHAVIORAL SYMPTOMS/MOOD NEUROLOGICAL BOWEL NUTRITION STATUS      Continent Diet(please see DC summary)  AMBULATORY STATUS COMMUNICATION OF NEEDS Skin   Extensive Assist Verbally Normal                       Personal Care Assistance Level of Assistance  Bathing, Feeding, Dressing Bathing Assistance:  Limited assistance Feeding assistance: Independent Dressing Assistance: Limited assistance     Functional Limitations Info  Sight, Hearing, Speech Sight Info: Adequate Hearing Info: Adequate Speech Info: Adequate    SPECIAL CARE FACTORS FREQUENCY  PT (By licensed PT)     PT Frequency: 5x/week              Contractures Contractures Info: Not present    Additional Factors Info  Code Status, Allergies, Psychotropic Code Status Info: DNR Allergies Info: Lipitor (Atorvastatin Calcium), Provera (Medroxyprogesterone Acetate), Prednisone Psychotropic Info: razadone         Current Medications (03/21/2018):  This is the current hospital active medication list Current Facility-Administered Medications  Medication Dose Route Frequency Provider Last Rate Last Dose  . acetaminophen (TYLENOL) tablet 500 mg  500 mg Oral Q8H PRN Janora Norlander, MD   500 mg at 03/19/18 2107  . bisacodyl (DULCOLAX) suppository 10 mg  10 mg Rectal Daily PRN Janora Norlander, MD      . clopidogrel (PLAVIX) tablet 75 mg  75 mg Oral Daily Janora Norlander, MD   75 mg at 03/21/18 0816  . docusate sodium (COLACE) capsule 100 mg  100 mg Oral BID PRN Janora Norlander, MD      . enoxaparin (LOVENOX) injection 40 mg  40 mg Subcutaneous Q24H Lavina Hamman, MD   40 mg at 03/20/18 2150  . ferrous sulfate tablet 325 mg  325 mg Oral Q breakfast Janora Norlander, MD   325 mg at 03/21/18 0816  .  fluPHENAZine (PROLIXIN) tablet 4 mg  4 mg Oral QHS Janora Norlander, MD   4 mg at 03/20/18 2149  . lip balm (CARMEX) ointment 1 application  1 application Topical PRN Janora Norlander, MD   1 application at 66/06/00 2154  . lithium carbonate (LITHOBID) CR tablet 300 mg  300 mg Oral Q12H Lavina Hamman, MD   300 mg at 03/21/18 0816  . loperamide (IMODIUM) capsule 2 mg  2 mg Oral Q8H PRN Janora Norlander, MD      . multivitamin with minerals tablet 1 tablet  1 tablet Oral Daily Janora Norlander, MD   1 tablet at 03/21/18 0815   . ondansetron (ZOFRAN) tablet 4 mg  4 mg Oral Q6H PRN Janora Norlander, MD       Or  . ondansetron [facility]) injection 4 mg  4 mg Intravenous Q6H PRN Janora Norlander, MD      . traZODone (DESYREL) tablet 50 mg  50 mg Oral QHS Janora Norlander, MD   50 mg at 03/20/18 2149  . [START ON 03/23/2018] Vitamin D (Ergocalciferol) (DRISDOL) capsule 50,000 Units  50,000 Units Oral Q Wed Janora Norlander, MD         Discharge Medications: Please see discharge summary for a list of discharge medications.  Relevant Imaging Results:  Relevant Lab Results:   Additional Information SS#: [id number]  Estanislado Emms, LCSW

## 2018-03-22 LAB — CULTURE, BLOOD (ROUTINE X 2)
CULTURE: NO GROWTH
Culture: NO GROWTH
Special Requests: ADEQUATE
Special Requests: ADEQUATE

## 2018-03-22 LAB — CBC
HCT: 35.8 % — ABNORMAL LOW (ref 36.0–46.0)
Hemoglobin: 11.3 g/dL — ABNORMAL LOW (ref 12.0–15.0)
MCH: 31.7 pg (ref 26.0–34.0)
MCHC: 31.6 g/dL (ref 30.0–36.0)
MCV: 100.6 fL — ABNORMAL HIGH (ref 80.0–100.0)
PLATELETS: 257 10*3/uL (ref 150–400)
RBC: 3.56 MIL/uL — ABNORMAL LOW (ref 3.87–5.11)
RDW: 14.5 % (ref 11.5–15.5)
WBC: 7.9 10*3/uL (ref 4.0–10.5)
nRBC: 0 % (ref 0.0–0.2)

## 2018-03-22 LAB — BASIC METABOLIC PANEL
Anion gap: 8 (ref 5–15)
BUN: 15 mg/dL (ref 8–23)
CO2: 22 mmol/L (ref 22–32)
Calcium: 9.2 mg/dL (ref 8.9–10.3)
Chloride: 112 mmol/L — ABNORMAL HIGH (ref 98–111)
Creatinine, Ser: 1.34 mg/dL — ABNORMAL HIGH (ref 0.44–1.00)
GFR calc Af Amer: 47 mL/min — ABNORMAL LOW (ref >60)
GFR calc non Af Amer: 41 mL/min — ABNORMAL LOW (ref >60)
Glucose, Bld: 95 mg/dL (ref 70–99)
Potassium: 3.9 mmol/L (ref 3.5–5.1)
Sodium: 142 mmol/L (ref 135–145)

## 2018-03-22 LAB — LITHIUM LEVEL: Lithium Lvl: 1.04 mmol/L (ref 0.60–1.20)

## 2018-03-22 NOTE — Care Management Note (Addendum)
Case Management Note  Patient Details  Name: Bridget Jackson MRN: [id number] Date of Birth: 29-Mar-1950  Subjective/Objective:  Pt presented for metabolic encephalopathy- lithium toxicity. CSW following for SNF placement.                   Action/Plan: CM will continue to monitor for additional transition of care needs.   Expected Discharge Date:                  Expected Discharge Plan:  Skilled Nursing Facility  In-House Referral:  Clinical Social Work  Discharge planning Services  CM Consult  Post Acute Care Choice:  NA Choice offered to:  NA  DME Arranged:  N/A DME Agency:  NA  HH Arranged:  NA HH Agency:  NA  Status of Service:  Completed, signed off  If discussed at [place] of Stay Meetings, dates discussed:    Additional Comments: 1050 03-24-18 Jacqlyn Krauss, RN,BSN [phone] Awaiting PASRR. CSW following for transition of care needs.  Bethena Roys, RN 03/22/2018, 10:14 AM

## 2018-03-22 NOTE — Progress Notes (Signed)
[facility] Progress Note  Patient: Bridget Jackson JYN:[id number]   PCP: Kathyrn Lass, MD DOB: 1950/05/15   DOA: 03/17/2018   DOS: 03/22/2018   Date of Service: the patient was seen and examined on 03/22/2018  Brief hospital course: Pt. with PMH of schizoaffective disorder, HTN, CVA, OSA; admitted on 03/17/2018, presented with complaint of confusion, was found to have lithium toxicity and resultant bradycardia. Currently further plan is continue current care.  Subjective: No acute complaint no nausea no vomiting.  No fever no chills.  No diarrhea.  Oral intake adequate.  Assessment and Plan: Acute metabolic encephalopathy: Resolved lithium toxicity.    Resolved Lithium level is over 2 times normal and neurologic exam (confusion, lethargy, nystagmus) and history fit with lithium toxicity.   Head CT shows nothing acute.   Lateral T wave inversions on electrocardiogram can be consistent with lithium toxicity.  No reports of any chest pain.   She also has an AKI which is likely contributing. -Lithium levels are trending down and now back to low normal range.  Will resume lithium at home dose. -Lithium level elevated secondary to use of amiloride. -We will stop the amiloride due to hyperkalemia  -Discussed with nephrology, patient was primarily started on amiloride due to diabetes insipidus from lithium, now with hyperkalemia with a small dose it is on hold.  Nephrology recommend a trial of HCTZ if treatment is needed but currently prefers to encourage oral fluid and hold off further pharmacotherapy.  Severe symptomatic bradycardia. EP was consulted yesterday. Initial plan was to watchful waiting. With her increasing pause patient was started on dopamine drip on 03/18/2018. Currently stopped.  Monitor.  Essential Hypertension: Patient is only on amiloride at home.  Given her AKI and hyperkalemia and hypotension on presentation will hold this medication for now.  AKI: Likely prerenal  due to decreased oral intake, diarrhea, in the setting of diuretic use -Hold amiloride -Dose all meds for GFR, avoid nephrotoxins Hold IV fluids.  Nephrogenic diabetes insipidus. 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 Patient was started on amiloride last admission.  Currently on hold  Schizoaffective disorder: -Continue home fluphenazine -Continue home trazodone Sister is against replacing lithium.  Tells me that patient had been on other mood stabilizing agents in the past and was not able to tolerate them including Lamictal. Tells with a story that during her recent ICU stay after being off of lithium for 1 day patient become psychotic. Recommending her to consult with her primary psychiatrist to consider change in lithium therapy. Discussed with our psychiatry here recommend the same.  History of CVA -Continue Plavix  Diet: Cardiac diet DVT Prophylaxis: subcutaneous Heparin  Advance goals of care discussion: DNR DNI  Family Communication: family was present at bedside, at the time of interview. The pt provided permission to discuss medical plan with the family. Opportunity was given to ask question and all questions were answered satisfactorily.   Disposition:  Discharge to be determined.  Consultants: EP Procedures: none  Scheduled Meds: . clopidogrel  75 mg Oral Daily  . enoxaparin (LOVENOX) injection  40 mg Subcutaneous Q24H  . ferrous sulfate  325 mg Oral Q breakfast  . fluPHENAZine  4 mg Oral QHS  . lithium carbonate  300 mg Oral Q12H  . multivitamin with minerals  1 tablet Oral Daily  . traZODone  50 mg Oral QHS  . [START ON 03/23/2018] Vitamin D (Ergocalciferol)  50,000 Units Oral Q Wed  Continuous Infusions:  PRN Meds: acetaminophen, bisacodyl, docusate sodium, lip balm, loperamide, ondansetron **OR** ondansetron (ZOFRAN) IV Antibiotics: Anti-infectives (From admission, onward)   None         Objective: Physical Exam: Vitals:   03/22/18 0930 03/22/18 1136 03/22/18 1150 03/22/18 1515  BP: 103/62 (!) 103/55 91/68 (!) 100/52  Pulse: 69 69 69 64  Resp: 18 14 16 14   Temp: 98 F (36.7 C) 98.6 F (37 C)  98.6 F (37 C)  TempSrc: Oral Axillary  Oral  SpO2: 99% 99% 99% 100%  Weight:      Height: 5\' 3"  (1.6 m)       Intake/Output Summary (Last 24 hours) at 03/22/2018 1816 Last data filed at 03/22/2018 1500 Gross per 24 hour  Intake 1840 ml  Output 3150 ml  Net -1310 ml   Filed Weights   03/20/18 0456 03/21/18 0537 03/22/18 0500  Weight: 80.3 kg 78.9 kg 77.6 kg   General: Alert, Awake and Oriented to Time, Place and Person. Appear in moderate distress, affect appropriate Eyes: PERRL, Conjunctiva normal ENT: Oral Mucosa clear moist. Neck: difficult to assess  JVD, no Abnormal Mass Or lumps Cardiovascular: S1 and S2 Present, no Murmur, Peripheral Pulses Present Respiratory: normal respiratory effort, Bilateral Air entry equal and Decreased, no use of accessory muscle, Clear to Auscultation, no Crackles, no wheezes Abdomen: Bowel Sound present, Soft and no tenderness, no hernia Skin: no redness, no Rash, no induration Extremities: bilateral  Pedal edema, no calf tenderness Neurologic: Grossly no focal neuro deficit. Bilaterally Equal motor strength  Data Reviewed: CBC: Recent Labs  Lab 03/18/18 0600 03/19/18 0552 03/20/18 0456 03/21/18 0638 03/22/18 0359  WBC 7.6 9.5 8.4 7.3 7.9  HGB 10.4* 11.6* 11.5* 11.2* 11.3*  HCT 34.7* 38.2 38.1 37.0 35.8*  MCV 102.7* 100.8* 100.3* 101.1* 100.6*  PLT 233 294 284 262 242   Basic Metabolic Panel: Recent Labs  Lab 03/18/18 0341 03/19/18 0552 03/20/18 0456 03/21/18 0638 03/22/18 0359  NA 141 143 145 144 142  K 5.0 4.5 4.6 5.6* 3.9  CL 120* 121* 120* 118* 112*  CO2 16* 18* 20* 21* 22  GLUCOSE 82 132* 112* 100* 95  BUN 17 11 11 14 15   CREATININE 1.55* 1.42* 1.37* 1.40* 1.34*  CALCIUM 8.6* 9.6 9.6 9.0 9.2  MG  --   2.2  --   --   --     Liver Function Tests: Recent Labs  Lab 03/17/18 1023  AST 28  ALT 21  ALKPHOS 100  BILITOT 0.5  PROT 6.4*  ALBUMIN 3.8   No results for input(s): LIPASE, AMYLASE in the last 168 hours. Recent Labs  Lab 03/17/18 1025  AMMONIA 25   Coagulation Profile: No results for input(s): INR, PROTIME in the last 168 hours. Cardiac Enzymes: Recent Labs  Lab 03/17/18 1023  TROPONINI <0.03   BNP (last 3 results) No results for input(s): PROBNP in the last 8760 hours. CBG: Recent Labs  Lab 03/17/18 1210 03/17/18 1649  GLUCAP 73 72   Studies: No results found.   Time spent: 35 minutes  Author: Berle Mull, MD [facility] 03/22/2018 6:16 PM  To reach On-call, see care teams to locate the attending and reach out to them via [email or web address]. If 7PM-7AM, please contact night-coverage If you still have difficulty reaching the attending provider, please page the [facility] (Director on Call) for [facility] on amion for assistance.

## 2018-03-22 NOTE — Social Work (Signed)
Patient's sister has chosen [facility].  Patient's PASRR has gone under QM[facility] review; patient will be assessed in person by a PASRR reviewer. Patient will need a PASRR approved before she can transfer to the facility.  Estanislado Emms, LCSW [phone]

## 2018-03-23 DIAGNOSIS — E872 Acidosis: Secondary | ICD-10-CM

## 2018-03-23 DIAGNOSIS — R9431 Abnormal electrocardiogram [ECG] [EKG]: Secondary | ICD-10-CM

## 2018-03-23 DIAGNOSIS — F259 Schizoaffective disorder, unspecified: Secondary | ICD-10-CM

## 2018-03-23 DIAGNOSIS — E785 Hyperlipidemia, unspecified: Secondary | ICD-10-CM

## 2018-03-23 DIAGNOSIS — G4731 Primary central sleep apnea: Secondary | ICD-10-CM

## 2018-03-23 DIAGNOSIS — I959 Hypotension, unspecified: Secondary | ICD-10-CM

## 2018-03-23 DIAGNOSIS — E875 Hyperkalemia: Secondary | ICD-10-CM

## 2018-03-23 DIAGNOSIS — H5500 Unspecified nystagmus: Secondary | ICD-10-CM

## 2018-03-23 DIAGNOSIS — N179 Acute kidney failure, unspecified: Secondary | ICD-10-CM

## 2018-03-23 DIAGNOSIS — R400 Somnolence: Secondary | ICD-10-CM

## 2018-03-23 LAB — CBC
HCT: 36.5 % (ref 36.0–46.0)
Hemoglobin: 11.4 g/dL — ABNORMAL LOW (ref 12.0–15.0)
MCH: 31.8 pg (ref 26.0–34.0)
MCHC: 31.2 g/dL (ref 30.0–36.0)
MCV: 101.7 fL — ABNORMAL HIGH (ref 80.0–100.0)
NRBC: 0 % (ref 0.0–0.2)
Platelets: 236 10*3/uL (ref 150–400)
RBC: 3.59 MIL/uL — ABNORMAL LOW (ref 3.87–5.11)
RDW: 14.3 % (ref 11.5–15.5)
WBC: 7.6 10*3/uL (ref 4.0–10.5)

## 2018-03-23 LAB — BASIC METABOLIC PANEL
Anion gap: 5 (ref 5–15)
BUN: 15 mg/dL (ref 8–23)
CO2: 24 mmol/L (ref 22–32)
CREATININE: 1.39 mg/dL — AB (ref 0.44–1.00)
Calcium: 9.2 mg/dL (ref 8.9–10.3)
Chloride: 115 mmol/L — ABNORMAL HIGH (ref 98–111)
GFR calc Af Amer: 45 mL/min — ABNORMAL LOW (ref >60)
GFR, EST NON AFRICAN AMERICAN: 39 mL/min — AB (ref >60)
Glucose, Bld: 100 mg/dL — ABNORMAL HIGH (ref 70–99)
Potassium: 4.1 mmol/L (ref 3.5–5.1)
SODIUM: 144 mmol/L (ref 135–145)

## 2018-03-23 LAB — LITHIUM LEVEL: Lithium Lvl: 0.89 mmol/L (ref 0.60–1.20)

## 2018-03-23 NOTE — Progress Notes (Signed)
PROGRESS NOTE    Bridget Jackson  MWN:[id number] DOB: 16-Aug-1950 DOA: 03/17/2018 PCP: Kathyrn Lass, MD   Brief Narrative:  HPI on 03/17/2018 by Dr. Royal Piedra Tyjanae Bartek is a 68 y.o. 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  Her sister was initially present at bedside and provided most of the history as patient was confused.  Sister was no longer at bedside at the time of my exam.  Patient lives alone however her sister checks in on her very frequently.  According to the sister's report, for the past 3 or 4 days patient has not been her normal self.  She has been progressively more confused and just not as interactive as usual.  Yesterday she noticed that patient had some tremors which were also unusual.  Upon my examination, patient is able to wake up and answer some questions appropriately.  She states that she has been feeling confused and that she was not able to put on her close while she was in her bathroom this morning.  Her sister came and found her unable to dress herself.  Patient states that she is had no fevers, no chest pain, no shortness of breath, no GI symptoms except she has had some occasional diarrhea over the last few days.  She was unable to answer when I asked her if she is been eating and drinking normally.  She did state that she was taking her medications as prescribed, not taking any more or less.  Interim history Admitted with confusion found to have lithium toxicity with bradycardia. Assessment & Plan  Acute metabolic encephalopathy -Resolved -Likely secondary to lithium toxicity (lithium was 2 times normal on admission) -CT head was unremarkable for acute findings -EKG showed lateral T wave inversions  on electrocardiogram (may be consistent with lithium toxicity)-no reports of chest pain -lithium resumed at home dose -PT recommended SNF  Severe symptomatic bradycardia -EP consulted and appreciated, commended avoiding lithium toxicity -Patient did require dopamine drip on 03/18/2018 which has since been discontinued -Resolved  Acute kidney injury on chronic kidney disease, stage III -Possibly prerenal due to increased oral intake versus diuretic use -Creatinine 1.8 on admission, baseline appears to be approximate 1.3-1.4 -Creatinine currently down to 1.39 -Continue to monitor BMP  Essential hypertension -Amiloride was held due to AKI as well as hyperkalemia along with hypotension on presentation -Holding off on medications, BP currently stable  Nephrogenic diabetes insipidus -Seems the patient was started on amiloride due to 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 Posey Pronto discussed with nephrology, recommended trial of HCTZ if treatment is needed but prefers oral intake and hold off on pharmacotherapy  Schizoaffective disorder -Continue fluphenazine, trazodone -Seems that sister was against replacing lithium.  Per previous documentation, patient has been on other mood stabilizing agents in the past but was unable to tolerate them including Lamictal. -Patient will need to follow-up with her psychiatrist -Previous hospitalist discussed with psychiatry they recommended the same  History of CVA -Continue Plavix  DVT Prophylaxis  Heparin  Code Status: DNR  Family Communication: None at bedside  Disposition Plan: Admitted.  Pending SNF placement- pending [facility]  Consultants Electrophysiology Nephrology and psychiatry via phone  Procedures  None  Antibiotics   Anti-infectives (From admission, onward)   None      Subjective:   Charlett Nose  [place] seen and examined today.  Patient has no complaints today.  Denies current chest pain, shortness breath,  abdominal pain, nausea or vomiting, diarrhea or constipation, dizziness or headache.  Objective:   Vitals:   03/22/18 1515 03/22/18 2046 03/23/18 0504 03/23/18 1144  BP: (!) 100/52 (!) 104/54 (!) 96/48 105/69  Pulse: 64 70  78  Resp: 14 18  18   Temp: 98.6 F (37 C) 98.3 F (36.8 C) 98.2 F (36.8 C) 98.6 F (37 C)  TempSrc: Oral Oral Oral Axillary  SpO2: 100% 97% 97% 96%  Weight:   77.1 kg   Height:        Intake/Output Summary (Last 24 hours) at 03/23/2018 1245 Last data filed at 03/23/2018 1100 Gross per 24 hour  Intake 1562 ml  Output 2600 ml  Net -1038 ml   Filed Weights   03/21/18 0537 03/22/18 0500 03/23/18 0504  Weight: 78.9 kg 77.6 kg 77.1 kg    Exam  General: Well developed, well nourished, NAD, appears stated age  93: NCAT, mucous membranes moist.   Neck: Supple  Cardiovascular: S1 S2 auscultated, no murmur, RRR  Respiratory: Clear to auscultation bilaterally   Abdomen: Soft, nontender, nondistended, + bowel sounds  Extremities: warm dry without cyanosis clubbing or edema  Neuro: AAOx3, nonfocal  Psych: Normal affect and demeanor with intact judgement and insight   Data Reviewed: I have personally reviewed following labs and imaging studies  CBC: Recent Labs  Lab 03/19/18 0552 03/20/18 0456 03/21/18 0638 03/22/18 0359 03/23/18 0327  WBC 9.5 8.4 7.3 7.9 7.6  HGB 11.6* 11.5* 11.2* 11.3* 11.4*  HCT 38.2 38.1 37.0 35.8* 36.5  MCV 100.8* 100.3* 101.1* 100.6* 101.7*  PLT 294 284 262 257 606   Basic Metabolic Panel: Recent Labs  Lab 03/19/18 0552 03/20/18 0456 03/21/18 0638 03/22/18 0359 03/23/18 0327  NA 143 145 144 142 144  K 4.5 4.6 5.6* 3.9 4.1  CL 121* 120* 118* 112* 115*  CO2 18* 20* 21* 22 24  GLUCOSE 132* 112* 100* 95 100*  BUN 11 11 14 15 15   CREATININE 1.42* 1.37* 1.40* 1.34* 1.39*  CALCIUM 9.6 9.6 9.0 9.2 9.2  MG 2.2  --   --   --   --    GFR: Estimated Creatinine Clearance: 38.1 mL/min (A) (by C-G formula based on  SCr of 1.39 mg/dL (H)). Liver Function Tests: Recent Labs  Lab 03/17/18 1023  AST 28  ALT 21  ALKPHOS 100  BILITOT 0.5  PROT 6.4*  ALBUMIN 3.8   No results for input(s): LIPASE, AMYLASE in the last 168 hours. Recent Labs  Lab 03/17/18 1025  AMMONIA 25   Coagulation Profile: No results for input(s): INR, PROTIME in the last 168 hours. Cardiac Enzymes: Recent Labs  Lab 03/17/18 1023  TROPONINI <0.03   BNP (last 3 results) No results for input(s): PROBNP in the last 8760 hours. HbA1C: No results for input(s): HGBA1C in the last 72 hours. CBG: Recent Labs  Lab 03/17/18 1210 03/17/18 1649  GLUCAP 73 72   Lipid Profile: No results for input(s): CHOL, HDL, LDLCALC, TRIG, CHOLHDL, LDLDIRECT in the last 72 hours. Thyroid Function Tests: No results for input(s): TSH, T4TOTAL, FREET4, T3FREE, THYROIDAB in the last 72 hours. Anemia Panel: No results for input(s): VITAMINB12, FOLATE, FERRITIN, TIBC, IRON, RETICCTPCT in the last 72 hours. Urine analysis:    Component Value Date/Time   COLORURINE STRAW (A) 03/17/2018 1025   APPEARANCEUR CLEAR 03/17/2018 1025   LABSPEC 1.008  03/17/2018 1025   PHURINE 7.0 03/17/2018 1025   GLUCOSEU NEGATIVE 03/17/2018 [place] 03/17/2018 1025   [place] 03/17/2018 1025   [place] 03/17/2018 1025   PROTEINUR NEGATIVE 03/17/2018 1025   NITRITE NEGATIVE 03/17/2018 1025   LEUKOCYTESUR NEGATIVE 03/17/2018 1025   Sepsis Labs: @LABRCNTIP (procalcitonin:4,lacticidven:4)  ) Recent Results (from the past 240 hour(s))  Urine Culture     Status: Abnormal   Collection Time: 03/17/18 10:33 AM  Result Value Ref Range Status   Specimen Description URINE, CLEAN CATCH  Final   Special Requests NONE  Final   Culture (A)  Final    <10,000 COLONIES/mL INSIGNIFICANT GROWTH Performed at [facility], [place] [address], [place], [place] [address]    Report Status 03/18/2018 FINAL  Final  Culture, blood (routine x  2)     Status: None   Collection Time: 03/17/18 10:40 AM  Result Value Ref Range Status   Specimen Description BLOOD LEFT HAND  Final   Special Requests   Final    BOTTLES DRAWN AEROBIC AND ANAEROBIC Blood Culture adequate volume   Culture   Final    NO GROWTH 5 DAYS Performed at [facility], [place] [address] [place], [place] [address]    Report Status 03/22/2018 FINAL  Final  Culture, blood (routine x 2)     Status: None   Collection Time: 03/17/18 10:45 AM  Result Value Ref Range Status   Specimen Description BLOOD RIGHT HAND  Final   Special Requests   Final    BOTTLES DRAWN AEROBIC AND ANAEROBIC Blood Culture adequate volume   Culture   Final    NO GROWTH 5 DAYS Performed at [facility], [place] [address] [place], [place] [address]    Report Status 03/22/2018 FINAL  Final      Radiology Studies: No results found.   Scheduled Meds: . clopidogrel  75 mg Oral Daily  . enoxaparin (LOVENOX) injection  40 mg Subcutaneous Q24H  . ferrous sulfate  325 mg Oral Q breakfast  . fluPHENAZine  4 mg Oral QHS  . lithium carbonate  300 mg Oral Q12H  . multivitamin with minerals  1 tablet Oral Daily  . traZODone  50 mg Oral QHS  . Vitamin D (Ergocalciferol)  50,000 Units Oral Q Wed   Continuous Infusions:   LOS: 6 days   Time Spent in minutes   30 minutes  Broderic Bara D.O. on 03/23/2018 at 12:45 PM  Between 7am to 7pm - Please see pager noted on [email or web address]  After 7pm go to [email or web address]  And look for the night coverage person covering for me after hours  [facility] Office  [phone]

## 2018-03-23 NOTE — Social Work (Signed)
Patient's PASRR still pending. Updated MD. CSW will follow.  Estanislado Emms, LCSW [phone]

## 2018-03-24 LAB — LITHIUM LEVEL: Lithium Lvl: 1.02 mmol/L (ref 0.60–1.20)

## 2018-03-24 NOTE — Care Management Important Message (Signed)
Important Message  Patient Details  Name: Bridget Jackson MRN: [id number] Date of Birth: November 17, 1950   Medicare Important Message Given:  Yes    [name] 03/24/2018, 12:05 PM

## 2018-03-24 NOTE — Progress Notes (Signed)
PROGRESS NOTE    Bridget Jackson  ONG:[id number] DOB: [birth year] DOA: 03/17/2018 PCP: Kathyrn Lass, MD   Brief Narrative:  HPI on 03/17/2018 by Dr. Royal Piedra Bridget Jackson is a 68 y.o. 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  Her sister was initially present at bedside and provided most of the history as patient was confused.  Sister was no longer at bedside at the time of my exam.  Patient lives alone however her sister checks in on her very frequently.  According to the sister's report, for the past 3 or 4 days patient has not been her normal self.  She has been progressively more confused and just not as interactive as usual.  Yesterday she noticed that patient had some tremors which were also unusual.  Upon my examination, patient is able to wake up and answer some questions appropriately.  She states that she has been feeling confused and that she was not able to put on her close while she was in her bathroom this morning.  Her sister came and found her unable to dress herself.  Patient states that she is had no fevers, no chest pain, no shortness of breath, no GI symptoms except she has had some occasional diarrhea over the last few days.  She was unable to answer when I asked her if she is been eating and drinking normally.  She did state that she was taking her medications as prescribed, not taking any more or less.  Interim history Admitted with confusion found to have lithium toxicity with bradycardia. Assessment & Plan  Acute metabolic encephalopathy -Resolved -Likely secondary to lithium toxicity (lithium was 2 times normal on admission) -CT head was unremarkable for acute findings -EKG showed lateral T wave inversions  on electrocardiogram (may be consistent with lithium toxicity)-no reports of chest pain -lithium resumed at home dose -PT recommended SNF  Severe symptomatic bradycardia -EP consulted and appreciated, commended avoiding lithium toxicity -Patient did require dopamine drip on 03/18/2018 which has since been discontinued -Resolved  Acute kidney injury on chronic kidney disease, stage III -Possibly prerenal due to increased oral intake versus diuretic use -Creatinine 1.8 on admission, baseline appears to be approximate 1.3-1.4 -Creatinine currently down to 1.39 -Continue to monitor BMP  Essential hypertension -Amiloride was held due to AKI as well as hyperkalemia along with hypotension on presentation -Holding off on medications, BP currently stable  Nephrogenic diabetes insipidus -Seems the patient was started on amiloride due to 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 Posey Pronto discussed with nephrology, recommended trial of HCTZ if treatment is needed but prefers oral intake and hold off on pharmacotherapy  Schizoaffective disorder -Continue fluphenazine, trazodone -Seems that sister was against replacing lithium.  Per previous documentation, patient has been on other mood stabilizing agents in the past but was unable to tolerate them including Lamictal. -Patient will need to follow-up with her psychiatrist -Previous hospitalist discussed with psychiatry they recommended the same  History of CVA -Continue Plavix  DVT Prophylaxis  Heparin  Code Status: DNR  Family Communication: Sister at bedside  Disposition Plan: Admitted.  Pending SNF placement- pending [facility]  Consultants Electrophysiology Nephrology and psychiatry via phone  Procedures  None  Antibiotics   Anti-infectives (From admission, onward)   None      Subjective:   Charlett Nose  Cyphers seen and examined today.  No complaints.  Denies current chest pain, shortness breath, abdominal pain, nausea  or vomiting, diarrhea constipation, dizziness or headache.  Objective:   Vitals:   03/23/18 1144 03/23/18 1942 03/24/18 0500 03/24/18 0516  BP: 105/69 (!) 105/50  (!) 94/55  Pulse: 78 64  66  Resp: 18 13  14   Temp: 98.6 F (37 C) 97.8 F (36.6 C)  97.8 F (36.6 C)  TempSrc: Axillary Oral  Oral  SpO2: 96% 99%  96%  Weight:   78.8 kg   Height:        Intake/Output Summary (Last 24 hours) at 03/24/2018 1253 Last data filed at 03/24/2018 1037 Gross per 24 hour  Intake 480 ml  Output 2000 ml  Net -1520 ml   Filed Weights   03/22/18 0500 03/23/18 0504 03/24/18 0500  Weight: 77.6 kg 77.1 kg 78.8 kg   Exam  General: Well developed, well nourished, NAD, appears stated age  87: NCAT, mucous membranes moist.   Neck: Supple  Cardiovascular: S1 S2 auscultated, no rubs, murmurs or gallops. Regular rate and rhythm.  Respiratory: Clear to auscultation bilaterally with equal chest rise  Abdomen: Soft, nontender, nondistended, + bowel sounds  Extremities: warm dry without cyanosis clubbing or edema  Neuro: AAOx3, nonfocal  Psych: Pleasant, appropriate mood and affect  Data Reviewed: I have personally reviewed following labs and imaging studies  CBC: Recent Labs  Lab 03/19/18 0552 03/20/18 0456 03/21/18 0638 03/22/18 0359 03/23/18 0327  WBC 9.5 8.4 7.3 7.9 7.6  HGB 11.6* 11.5* 11.2* 11.3* 11.4*  HCT 38.2 38.1 37.0 35.8* 36.5  MCV 100.8* 100.3* 101.1* 100.6* 101.7*  PLT 294 284 262 257 660   Basic Metabolic Panel: Recent Labs  Lab 03/19/18 0552 03/20/18 0456 03/21/18 0638 03/22/18 0359 03/23/18 0327  NA 143 145 144 142 144  K 4.5 4.6 5.6* 3.9 4.1  CL 121* 120* 118* 112* 115*  CO2 18* 20* 21* 22 24  GLUCOSE 132* 112* 100* 95 100*  BUN 11 11 14 15 15   CREATININE 1.42* 1.37* 1.40* 1.34* 1.39*  CALCIUM 9.6 9.6 9.0 9.2 9.2  MG 2.2  --   --   --   --    GFR: Estimated Creatinine Clearance: 38.5 mL/min (A) (by C-G formula based on SCr of 1.39 mg/dL  (H)). Liver Function Tests: No results for input(s): AST, ALT, ALKPHOS, BILITOT, PROT, ALBUMIN in the last 168 hours. No results for input(s): LIPASE, AMYLASE in the last 168 hours. No results for input(s): AMMONIA in the last 168 hours. Coagulation Profile: No results for input(s): INR, PROTIME in the last 168 hours. Cardiac Enzymes: No results for input(s): CKTOTAL, CKMB, CKMBINDEX, TROPONINI in the last 168 hours. BNP (last 3 results) No results for input(s): PROBNP in the last 8760 hours. HbA1C: No results for input(s): HGBA1C in the last 72 hours. CBG: Recent Labs  Lab 03/17/18 1649  GLUCAP 72   Lipid Profile: No results for input(s): CHOL, HDL, LDLCALC, TRIG, CHOLHDL, LDLDIRECT in the last 72 hours. Thyroid Function Tests: No results for input(s): TSH, T4TOTAL, FREET4, T3FREE, THYROIDAB in the last 72 hours. Anemia Panel: No results for input(s): VITAMINB12, FOLATE, FERRITIN, TIBC, IRON, RETICCTPCT in the last 72 hours. Urine analysis:    Component Value Date/Time   COLORURINE STRAW (A) 03/17/2018 1025   APPEARANCEUR CLEAR 03/17/2018 1025   LABSPEC 1.008 03/17/2018 1025   PHURINE 7.0 03/17/2018 1025   GLUCOSEU NEGATIVE 03/17/2018 1025   HGBUR NEGATIVE  03/17/2018 1025   [place] 03/17/2018 1025   [place] 03/17/2018 1025   PROTEINUR NEGATIVE 03/17/2018 1025   NITRITE NEGATIVE 03/17/2018 1025   LEUKOCYTESUR NEGATIVE 03/17/2018 1025   Sepsis Labs: @LABRCNTIP (procalcitonin:4,lacticidven:4)  ) Recent Results (from the past 240 hour(s))  Urine Culture     Status: Abnormal   Collection Time: 03/17/18 10:33 AM  Result Value Ref Range Status   Specimen Description URINE, CLEAN CATCH  Final   Special Requests NONE  Final   Culture (A)  Final    <10,000 COLONIES/mL INSIGNIFICANT GROWTH Performed at [facility], [place] [address] [place], [place] [address]    Report Status 03/18/2018 FINAL  Final  Culture, blood (routine x 2)     Status:  None   Collection Time: 03/17/18 10:40 AM  Result Value Ref Range Status   Specimen Description BLOOD LEFT HAND  Final   Special Requests   Final    BOTTLES DRAWN AEROBIC AND ANAEROBIC Blood Culture adequate volume   Culture   Final    NO GROWTH 5 DAYS Performed at [facility], [place] [address], [place], [place] [address]    Report Status 03/22/2018 FINAL  Final  Culture, blood (routine x 2)     Status: None   Collection Time: 03/17/18 10:45 AM  Result Value Ref Range Status   Specimen Description BLOOD RIGHT HAND  Final   Special Requests   Final    BOTTLES DRAWN AEROBIC AND ANAEROBIC Blood Culture adequate volume   Culture   Final    NO GROWTH 5 DAYS Performed at [facility], [place] [address], [place],  [address]    Report Status 03/22/2018 FINAL  Final      Radiology Studies: No results found.   Scheduled Meds: . clopidogrel  75 mg Oral Daily  . enoxaparin (LOVENOX) injection  40 mg Subcutaneous Q24H  . ferrous sulfate  325 mg Oral Q breakfast  . fluPHENAZine  4 mg Oral QHS  . lithium carbonate  300 mg Oral Q12H  . multivitamin with minerals  1 tablet Oral Daily  . traZODone  50 mg Oral QHS  . Vitamin D (Ergocalciferol)  50,000 Units Oral Q Wed   Continuous Infusions:   LOS: 7 days   Time Spent in minutes   30 minutes  Josseline Reddin D.O. on 03/24/2018 at 12:53 PM  Between 7am to 7pm - Please see pager noted on [email or web address]  After 7pm go to [email or web address]  And look for the night coverage person covering for me after hours  [facility] Office  [phone]

## 2018-03-24 NOTE — Social Work (Signed)
Patient's PASRR is still under manual review. CSW to follow.  Estanislado Emms, LCSW [phone]

## 2018-03-24 NOTE — Progress Notes (Signed)
Physical Therapy Treatment Patient Details Name: Bridget Jackson MRN: [id number] DOB: 1950-10-09 Today's Date: 03/24/2018    History of Present Illness Pt is a 68 y.o. female admitted 03/17/18 with AMS. Head CT without acute abnormalitiy. Worked up for acute metabolic encephalopathy and bradycardia likely secondary to lithium toxicity. Also with AKI on CKD III. PMH includes cerebellar stroke, HTN, OSA, schizoaffective disorder.   PT Comments    Pt progressing with mobility. Able to increased ambulation distance with RW, requiring rest break secondary to fatigue. Pt currently requiring intermittent minA to stand and maintain balance with mobility. Continue to recommend SNF-level therapies. Pt remains motivated to participate and regain PLOF.   Follow Up Recommendations  SNF;Supervision for mobility/OOB     Equipment Recommendations  None recommended by PT    Recommendations for Other Services       Precautions / Restrictions Precautions Precautions: Fall Restrictions Weight Bearing Restrictions: No    Mobility  Bed Mobility               General bed mobility comments: Received sitting in recliner  Transfers Overall transfer level: Needs assistance Equipment used: Rolling walker (2 wheeled) Transfers: Sit to/from Stand Sit to Stand: Min assist         General transfer comment: Stood from recliner and chair with arm rests with minA for trunk elevation; heavy reliance on BUEs to push into standing. Repeated cues for correct hand technique  Ambulation/Gait Ambulation/Gait assistance: Min assist;Min guard Gait Distance (Feet): 20 Feet(+28') Assistive device: Rolling walker (2 wheeled) Gait Pattern/deviations: Step-to pattern;Trunk flexed;Decreased dorsiflexion - right;Decreased dorsiflexion - left Gait velocity: Decreased Gait velocity interpretation: <1.31 ft/sec, indicative of household ambulator General Gait Details: Very slow gait with RW and intermittent minA  for balance; amb 48' total with 1x seated rest break secondary to fatigue. Pt able to increase gait speed with cues, but not able to maintain   Stairs             Wheelchair Mobility    Modified Rankin (Stroke Patients Only)       Balance Overall balance assessment: Needs assistance Sitting-balance support: Feet supported;Bilateral upper extremity supported Sitting balance-Leahy Scale: Fair       Standing balance-Leahy Scale: Poor Standing balance comment: Reliant on UE support; leaning trunk on sink when standing to wash hands                             Cognition Arousal/Alertness: Awake/alert Behavior During Therapy: Flat affect Overall Cognitive Status: No family/caregiver present to determine baseline cognitive functioning Area of Impairment: Following commands;Safety/judgement;Awareness;Problem solving                       Following Commands: Follows one step commands inconsistently;Follows one step commands with increased time Safety/Judgement: Decreased awareness of safety;Decreased awareness of deficits Awareness: Emergent Problem Solving: Slow processing;Decreased initiation;Requires verbal cues;Requires tactile cues        Exercises      General Comments        Pertinent Vitals/Pain Pain Assessment: No/denies pain    Home Living                      Prior Function            PT Goals (current goals can now be found in the care plan section) Acute Rehab PT Goals Patient Stated Goal: to get stronger PT Goal Formulation: With  patient Time For Goal Achievement: 04/04/18 Potential to Achieve Goals: Good Progress towards PT goals: Progressing toward goals    Frequency    Min 2X/week      PT Plan Current plan remains appropriate    Co-evaluation              AM-PAC PT "6 Clicks" Mobility   Outcome Measure  Help needed turning from your back to your side while in a flat bed without using  bedrails?: A Little Help needed moving from lying on your back to sitting on the side of a flat bed without using bedrails?: A Little Help needed moving to and from a bed to a chair (including a wheelchair)?: A Little Help needed standing up from a chair using your arms (e.g., wheelchair or bedside chair)?: A Little Help needed to walk in hospital room?: A Little Help needed climbing 3-5 steps with a railing? : Total 6 Click Score: 16    End of Session Equipment Utilized During Treatment: Gait belt Activity Tolerance: Patient tolerated treatment well Patient left: in chair;with call bell/phone within reach;with chair alarm set Nurse Communication: Mobility status PT Visit Diagnosis: Unsteadiness on feet (R26.81);Muscle weakness (generalized) (M62.81);Difficulty in walking, not elsewhere classified (R26.2)     Time: 4037-5436 PT Time Calculation (min) (ACUTE ONLY): 18 min  Charges:  $Gait Training: 8-22 mins                    Mabeline Caras, PT, DPT Acute Rehabilitation Services  Pager [phone] [place] 03/24/2018, 11:56 AM

## 2018-03-25 LAB — BASIC METABOLIC PANEL
Anion gap: 4 — ABNORMAL LOW (ref 5–15)
BUN: 13 mg/dL (ref 8–23)
CO2: 28 mmol/L (ref 22–32)
CREATININE: 1.33 mg/dL — AB (ref 0.44–1.00)
Calcium: 9.2 mg/dL (ref 8.9–10.3)
Chloride: 111 mmol/L (ref 98–111)
GFR calc Af Amer: 47 mL/min — ABNORMAL LOW (ref >60)
GFR calc non Af Amer: 41 mL/min — ABNORMAL LOW (ref >60)
Glucose, Bld: 103 mg/dL — ABNORMAL HIGH (ref 70–99)
Potassium: 3.8 mmol/L (ref 3.5–5.1)
Sodium: 143 mmol/L (ref 135–145)

## 2018-03-25 LAB — LITHIUM LEVEL: Lithium Lvl: 0.97 mmol/L (ref 0.60–1.20)

## 2018-03-25 MED ORDER — LACTATED RINGERS IV BOLUS
500.0000 mL | Freq: Once | INTRAVENOUS | Status: AC
  Administered 2018-03-25: 500 mL via INTRAVENOUS

## 2018-03-25 MED ORDER — SODIUM CHLORIDE 0.9 % IV BOLUS: 500.0000 mL | Freq: Once | INTRAVENOUS | Status: DC

## 2018-03-25 NOTE — Progress Notes (Signed)
pts blood pressure 89/48, HR 62, RR 12. Pt asymptomatic, paged MD. Got orders for 500 LR bolus. Will continue to monitor.

## 2018-03-25 NOTE — Progress Notes (Signed)
PROGRESS NOTE    Bridget Jackson  YBO:[id number] DOB: 1950/02/19 DOA: 03/17/2018 PCP: Kathyrn Lass, MD   Brief Narrative:  HPI on 03/17/2018 by Dr. Royal Piedra Bridget Jackson is a 68 y.o. 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  Her sister was initially present at bedside and provided most of the history as patient was confused.  Sister was no longer at bedside at the time of my exam.  Patient lives alone however her sister checks in on her very frequently.  According to the sister's report, for the past 3 or 4 days patient has not been her normal self.  She has been progressively more confused and just not as interactive as usual.  Yesterday she noticed that patient had some tremors which were also unusual.  Upon my examination, patient is able to wake up and answer some questions appropriately.  She states that she has been feeling confused and that she was not able to put on her close while she was in her bathroom this morning.  Her sister came and found her unable to dress herself.  Patient states that she is had no fevers, no chest pain, no shortness of breath, no GI symptoms except she has had some occasional diarrhea over the last few days.  She was unable to answer when I asked her if she is been eating and drinking normally.  She did state that she was taking her medications as prescribed, not taking any more or less.  Interim history Admitted with confusion found to have lithium toxicity with bradycardia. Pending SNF. Assessment & Plan  Acute metabolic encephalopathy -Resolved -Likely secondary to lithium toxicity (lithium was 2 times normal on admission) -CT head was unremarkable for acute findings -EKG showed lateral T wave  inversions on electrocardiogram (may be consistent with lithium toxicity)-no reports of chest pain -lithium resumed at home dose, level WNL -PT recommended SNF  Severe symptomatic bradycardia -EP consulted and appreciated, commended avoiding lithium toxicity -Patient did require dopamine drip on 03/18/2018 which has since been discontinued -Resolved  Acute kidney injury on chronic kidney disease, stage III -Possibly prerenal due to increased oral intake versus diuretic use -Creatinine 1.8 on admission, baseline appears to be approximate 1.3-1.4 -Creatinine currently down to 1.33 -Continue to monitor BMP  Essential hypertension -Amiloride was held due to AKI as well as hyperkalemia along with hypotension on presentation -Holding off on medications, BP currently stable  Nephrogenic diabetes insipidus -Seems the patient was started on amiloride due to 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 Posey Pronto discussed with nephrology, recommended trial of HCTZ if treatment is needed but prefers oral intake and hold off on pharmacotherapy  Schizoaffective disorder -Continue fluphenazine, trazodone -Seems that sister was against replacing lithium.  Per previous documentation, patient has been on other mood stabilizing agents in the past but was unable to tolerate them including Lamictal. -Patient will need to follow-up with her psychiatrist -Previous hospitalist discussed with psychiatry they recommended the same  History of CVA -Continue Plavix  DVT Prophylaxis  Heparin  Code Status: DNR  Family Communication: None at bedside  Disposition Plan: Admitted.  Pending SNF placement- pending [facility]  Consultants Electrophysiology Nephrology and psychiatry via phone  Procedures  None  Antibiotics   Anti-infectives (From admission, onward)   None  Subjective:   Bridget Jackson seen and examined today.  Patient has no complaints today.  Denies chest pain, shortness  of breath, dental pain, nausea vomiting, diarrhea constipation, dizziness or headache.  Objective:   Vitals:   03/25/18 0229 03/25/18 0317 03/25/18 0356 03/25/18 0404  BP: (!) 88/49 91/60 107/67   Pulse:   63   Resp: 11 13 14    Temp:   97.9 F (36.6 C)   TempSrc:   Oral   SpO2:   98%   Weight:    78.2 kg  Height:        Intake/Output Summary (Last 24 hours) at 03/25/2018 1226 Last data filed at 03/25/2018 1043 Gross per 24 hour  Intake 1440 ml  Output 2775 ml  Net -1335 ml   Filed Weights   03/23/18 0504 03/24/18 0500 03/25/18 0404  Weight: 77.1 kg 78.8 kg 78.2 kg   Exam  General: Well developed, well nourished, NAD, appears stated age  1: NCAT, mucous membranes moist.  Cardiovascular: S1 S2 auscultated, RRR, no murmur  Respiratory: Clear to auscultation bilaterally with equal chest rise  Abdomen: Soft, nontender, nondistended, + bowel sounds  Extremities: warm dry without cyanosis clubbing or edema  Neuro: AAOx3, nonfocal  Psych: Normal affect and demeanor   Data Reviewed: I have personally reviewed following labs and imaging studies  CBC: Recent Labs  Lab 03/19/18 0552 03/20/18 0456 03/21/18 0638 03/22/18 0359 03/23/18 0327  WBC 9.5 8.4 7.3 7.9 7.6  HGB 11.6* 11.5* 11.2* 11.3* 11.4*  HCT 38.2 38.1 37.0 35.8* 36.5  MCV 100.8* 100.3* 101.1* 100.6* 101.7*  PLT 294 284 262 257 914   Basic Metabolic Panel: Recent Labs  Lab 03/19/18 0552 03/20/18 0456 03/21/18 0638 03/22/18 0359 03/23/18 0327 03/25/18 0611  NA 143 145 144 142 144 143  K 4.5 4.6 5.6* 3.9 4.1 3.8  CL 121* 120* 118* 112* 115* 111  CO2 18* 20* 21* 22 24 28   GLUCOSE 132* 112* 100* 95 100* 103*  BUN 11 11 14 15 15 13   CREATININE 1.42* 1.37* 1.40* 1.34* 1.39* 1.33*  CALCIUM 9.6 9.6 9.0 9.2 9.2 9.2  MG 2.2  --   --   --   --   --    GFR: Estimated Creatinine Clearance: 40.1 mL/min (A) (by C-G formula based on SCr of 1.33 mg/dL (H)). Liver Function Tests: No results for  input(s): AST, ALT, ALKPHOS, BILITOT, PROT, ALBUMIN in the last 168 hours. No results for input(s): LIPASE, AMYLASE in the last 168 hours. No results for input(s): AMMONIA in the last 168 hours. Coagulation Profile: No results for input(s): INR, PROTIME in the last 168 hours. Cardiac Enzymes: No results for input(s): CKTOTAL, CKMB, CKMBINDEX, TROPONINI in the last 168 hours. BNP (last 3 results) No results for input(s): PROBNP in the last 8760 hours. HbA1C: No results for input(s): HGBA1C in the last 72 hours. CBG: No results for input(s): GLUCAP in the last 168 hours. Lipid Profile: No results for input(s): CHOL, HDL, LDLCALC, TRIG, CHOLHDL, LDLDIRECT in the last 72 hours. Thyroid Function Tests: No results for input(s): TSH, T4TOTAL, FREET4, T3FREE, THYROIDAB in the last 72 hours. Anemia Panel: No results for input(s): VITAMINB12, FOLATE, FERRITIN, TIBC, IRON, RETICCTPCT in the last 72 hours. Urine analysis:    Component Value Date/Time   COLORURINE STRAW (A) 03/17/2018 1025   APPEARANCEUR CLEAR 03/17/2018 1025   LABSPEC 1.008 03/17/2018 1025   PHURINE 7.0 03/17/2018 1025   GLUCOSEU NEGATIVE 03/17/2018 1025   HGBUR  NEGATIVE 03/17/2018 1025   [place] 03/17/2018 1025   [place] 03/17/2018 1025   PROTEINUR NEGATIVE 03/17/2018 1025   NITRITE NEGATIVE 03/17/2018 1025   LEUKOCYTESUR NEGATIVE 03/17/2018 1025   Sepsis Labs: @LABRCNTIP (procalcitonin:4,lacticidven:4)  ) Recent Results (from the past 240 hour(s))  Urine Culture     Status: Abnormal   Collection Time: 03/17/18 10:33 AM  Result Value Ref Range Status   Specimen Description URINE, CLEAN CATCH  Final   Special Requests NONE  Final   Culture (A)  Final    <10,000 COLONIES/mL INSIGNIFICANT GROWTH Performed at [facility], [place] [address], [place], [place] [address]    Report Status 03/18/2018 FINAL  Final  Culture, blood (routine x 2)     Status: None   Collection Time: 03/17/18 10:40  AM  Result Value Ref Range Status   Specimen Description BLOOD LEFT HAND  Final   Special Requests   Final    BOTTLES DRAWN AEROBIC AND ANAEROBIC Blood Culture adequate volume   Culture   Final    NO GROWTH 5 DAYS Performed at [facility], [place] [address], [place], [place] [address]    Report Status 03/22/2018 FINAL  Final  Culture, blood (routine x 2)     Status: None   Collection Time: 03/17/18 10:45 AM  Result Value Ref Range Status   Specimen Description BLOOD RIGHT HAND  Final   Special Requests   Final    BOTTLES DRAWN AEROBIC AND ANAEROBIC Blood Culture adequate volume   Culture   Final    NO GROWTH 5 DAYS Performed at [facility], [place] [address], [place], [place] [address]    Report Status 03/22/2018 FINAL  Final      Radiology Studies: No results found.   Scheduled Meds: . clopidogrel  75 mg Oral Daily  . enoxaparin (LOVENOX) injection  40 mg Subcutaneous Q24H  . ferrous sulfate  325 mg Oral Q breakfast  . fluPHENAZine  4 mg Oral QHS  . lithium carbonate  300 mg Oral Q12H  . multivitamin with minerals  1 tablet Oral Daily  . traZODone  50 mg Oral QHS  . Vitamin D (Ergocalciferol)  50,000 Units Oral Q Wed   Continuous Infusions:   LOS: 8 days   Time Spent in minutes   30 minutes  Einer Meals D.O. on 03/25/2018 at 12:26 PM  Between 7am to 7pm - Please see pager noted on [email or web address]  After 7pm go to [email or web address]  And look for the night coverage person covering for me after hours  [facility] Office  [phone]

## 2018-03-25 NOTE — Social Work (Addendum)
5:06 pm Patient was assessed for PASRR in-person today, however, her PASRR has not been assigned yet. PASRR is closed on the weekends, so patient will not be able to admit to the facility until [facility] approved next week. Updated patient's sister.  10:26 am PASRR under manual review. Patient to be assessed in-person today.  Estanislado Emms, LCSW [phone]

## 2018-03-25 NOTE — Progress Notes (Addendum)
Physical Therapy Treatment Patient Details Name: Bridget Jackson MRN: [id number] DOB: 04-12-1950 Today's Date: 03/25/2018    History of Present Illness Pt is a 68 y.o. female admitted 03/17/18 with AMS. Head CT without acute abnormalitiy. Worked up for acute metabolic encephalopathy and bradycardia likely secondary to lithium toxicity. Also with AKI on CKD III. PMH includes cerebellar stroke, HTN, OSA, schizoaffective disorder.   PT Comments    Pt hypotensive this session with drop in BP upon standing (see values below). Able to perform standing gait activity, reliant on min guard with RW, and seated LE therex. Pt asymptomatic throughout session. Remains motivated to participate and discharge to rehab. Session limited by arrival of RN to complete PASRR.  Orthostatic BPs Sitting 97/61  Standing 80/48  Standing after 2 min 104/71  Return to sit 107/72      Follow Up Recommendations  SNF;Supervision for mobility/OOB     Equipment Recommendations  None recommended by PT    Recommendations for Other Services       Precautions / Restrictions Precautions Precautions: Fall;Other (comment) Precaution Comments: Soft BP, asymptomatic Restrictions Weight Bearing Restrictions: No    Mobility  Bed Mobility               General bed mobility comments: Received sitting in recliner  Transfers Overall transfer level: Needs assistance Equipment used: Rolling walker (2 wheeled) Transfers: Sit to/from Stand Sit to Stand: Min guard            Ambulation/Gait                 Stairs             Wheelchair Mobility    Modified Rankin (Stroke Patients Only)       Balance Overall balance assessment: Needs assistance Sitting-balance support: Feet supported;Bilateral upper extremity supported Sitting balance-Leahy Scale: Fair Sitting balance - Comments: Able to perform prolonged sitting at edge of chair for seated therex   Standing balance support:  Bilateral upper extremity supported;During functional activity Standing balance-Leahy Scale: Poor Standing balance comment: Reliant on UE support to maintain standing balance and pre-gait activity                            Cognition Arousal/Alertness: Awake/alert Behavior During Therapy: Flat affect Overall Cognitive Status: No family/caregiver present to determine baseline cognitive functioning Area of Impairment: Following commands;Problem solving                       Following Commands: Follows multi-step commands inconsistently     Problem Solving: Slow processing;Requires verbal cues;Requires tactile cues General Comments: Pt generally moving slowly; likely baseline cognition      Exercises General Exercises - Lower Extremity Long Arc Quad: AROM;Both;Seated Hip Flexion/Marching: AROM;Both;Seated Toe Raises: AROM;Both;Seated Heel Raises: AROM;Both;Seated    General Comments General comments (skin integrity, edema, etc.): Session limited by (+) orthostatic hypotension and RN arrival to complete PASRR      Pertinent Vitals/Pain Pain Assessment: No/denies pain    Home Living                      Prior Function            PT Goals (current goals can now be found in the care plan section) Acute Rehab PT Goals Patient Stated Goal: to get stronger PT Goal Formulation: With patient Time For Goal Achievement: 04/04/18 Potential to Achieve Goals: Good  Progress towards PT goals: Progressing toward goals    Frequency    Min 2X/week      PT Plan Current plan remains appropriate    Co-evaluation              AM-PAC PT "6 Clicks" Mobility   Outcome Measure  Help needed turning from your back to your side while in a flat bed without using bedrails?: A Little Help needed moving from lying on your back to sitting on the side of a flat bed without using bedrails?: A Little Help needed moving to and from a bed to a chair (including  a wheelchair)?: A Little Help needed standing up from a chair using your arms (e.g., wheelchair or bedside chair)?: A Little Help needed to walk in hospital room?: A Little Help needed climbing 3-5 steps with a railing? : Total 6 Click Score: 16    End of Session Equipment Utilized During Treatment: Gait belt Activity Tolerance: Patient tolerated treatment well;Treatment limited secondary to medical complications (Comment) Patient left: in chair;with call bell/phone within reach(with RN to complete PASRR) Nurse Communication: Mobility status PT Visit Diagnosis: Unsteadiness on feet (R26.81);Muscle weakness (generalized) (M62.81);Difficulty in walking, not elsewhere classified (R26.2)     Time: 5027-7412 PT Time Calculation (min) (ACUTE ONLY): 13 min  Charges:  $Therapeutic Exercise: 8-22 mins                    Mabeline Caras, PT, DPT Acute Rehabilitation Services  Pager [phone] Office [place] 03/25/2018, 12:45 PM

## 2018-03-26 LAB — LITHIUM LEVEL: Lithium Lvl: 0.99 mmol/L (ref 0.60–1.20)

## 2018-03-26 NOTE — Progress Notes (Signed)
PROGRESS NOTE    Bridget Jackson  YIR:[id number] DOB: [birth year] DOA: 03/17/2018 PCP: Kathyrn Lass, MD   Brief Narrative:  HPI on 03/17/2018 by Dr. Royal Piedra Bridget Jackson is a 68 y.o. 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  Her sister was initially present at bedside and provided most of the history as patient was confused.  Sister was no longer at bedside at the time of my exam.  Patient lives alone however her sister checks in on her very frequently.  According to the sister's report, for the past 3 or 4 days patient has not been her normal self.  She has been progressively more confused and just not as interactive as usual.  Yesterday she noticed that patient had some tremors which were also unusual.  Upon my examination, patient is able to wake up and answer some questions appropriately.  She states that she has been feeling confused and that she was not able to put on her close while she was in her bathroom this morning.  Her sister came and found her unable to dress herself.  Patient states that she is had no fevers, no chest pain, no shortness of breath, no GI symptoms except she has had some occasional diarrhea over the last few days.  She was unable to answer when I asked her if she is been eating and drinking normally.  She did state that she was taking her medications as prescribed, not taking any more or less.  Interim history Admitted with confusion found to have lithium toxicity with bradycardia. Pending SNF. Assessment & Plan  Acute metabolic encephalopathy -Resolved -Likely secondary to lithium toxicity (lithium was 2 times normal on admission) -CT head was unremarkable for acute findings -EKG showed lateral T wave  inversions on electrocardiogram (may be consistent with lithium toxicity)-no reports of chest pain -lithium resumed at home dose, level WNL- 0.99 -PT recommended SNF  Severe symptomatic bradycardia -EP consulted and appreciated, commended avoiding lithium toxicity -Patient did require dopamine drip on 03/18/2018 which has since been discontinued -Resolved  Acute kidney injury on chronic kidney disease, stage III -Possibly prerenal due to increased oral intake versus diuretic use -Creatinine 1.8 on admission, baseline appears to be approximate 1.3-1.4 -Creatinine currently down to 1.33 -Continue to monitor BMP  Essential hypertension -Amiloride was held due to AKI as well as hyperkalemia along with hypotension on presentation -Holding off on medications, BP currently stable  Nephrogenic diabetes insipidus -Seems the patient was started on amiloride due to 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 Posey Pronto discussed with nephrology, recommended trial of HCTZ if treatment is needed but prefers oral intake and hold off on pharmacotherapy  Schizoaffective disorder -Continue fluphenazine, trazodone -Seems that sister was against replacing lithium.  Per previous documentation, patient has been on other mood stabilizing agents in the past but was unable to tolerate them including Lamictal. -Patient will need to follow-up with her psychiatrist -Previous hospitalist discussed with psychiatry they recommended the same  History of CVA -Continue Plavix  DVT Prophylaxis  Heparin  Code Status: DNR  Family Communication: None at bedside  Disposition Plan: Admitted.  Pending SNF placement- pending PASRR  Consultants Electrophysiology Nephrology and psychiatry via phone  Procedures  None  Antibiotics   Anti-infectives (From admission, onward)   None  Subjective:   [name] seen and examined today.  Patient with no complaints today. Denies chest pain,  shortness of breath, abdominal pain, N/VD/C, dizziness, headache.   Objective:   Vitals:   03/25/18 0404 03/25/18 1949 03/26/18 0233 03/26/18 0442  BP:  (!) 119/58 105/64 117/64  Pulse:  (!) 59 66 60  Resp:   14 11  Temp:  97.7 F (36.5 C) 98.1 F (36.7 C) 98.1 F (36.7 C)  TempSrc:    Oral  SpO2:  100% 100% 98%  Weight: 78.2 kg  78.4 kg 78.6 kg  Height:        Intake/Output Summary (Last 24 hours) at 03/26/2018 0714 Last data filed at 03/26/2018 0234 Gross per 24 hour  Intake 1200 ml  Output 3125 ml  Net -1925 ml   Filed Weights   03/25/18 0404 03/26/18 0233 03/26/18 0442  Weight: 78.2 kg 78.4 kg 78.6 kg   Exam  General: Well developed, well nourished, NAD, appears stated age  HEENT: NCAT, mucous membranes moist.   Neck: Supple,  Cardiovascular: S1 S2 auscultated, no rubs, murmurs or gallops. Regular rate and rhythm.  Respiratory: Clear to auscultation bilaterally with equal chest rise  Abdomen: Soft, nontender, nondistended, + bowel sounds  Extremities: warm dry without cyanosis clubbing or edema  Neuro: AAOx3,nonfocal  Psych: Pleasant, appropriate mood and affect   Data Reviewed: I have personally reviewed following labs and imaging studies  CBC: Recent Labs  Lab 03/20/18 0456 03/21/18 0638 03/22/18 0359 03/23/18 0327  WBC 8.4 7.3 7.9 7.6  HGB 11.5* 11.2* 11.3* 11.4*  HCT 38.1 37.0 35.8* 36.5  MCV 100.3* 101.1* 100.6* 101.7*  PLT 284 262 257 564   Basic Metabolic Panel: Recent Labs  Lab 03/20/18 0456 03/21/18 0638 03/22/18 0359 03/23/18 0327 03/25/18 0611  NA 145 144 142 144 143  K 4.6 5.6* 3.9 4.1 3.8  CL 120* 118* 112* 115* 111  CO2 20* 21* 22 24 28   GLUCOSE 112* 100* 95 100* 103*  BUN 11 14 15 15 13   CREATININE 1.37* 1.40* 1.34* 1.39* 1.33*  CALCIUM 9.6 9.0 9.2 9.2 9.2   GFR: Estimated Creatinine Clearance: 40.2 mL/min (A) (by C-G formula based on SCr of 1.33 mg/dL (H)). Liver Function Tests: No results for input(s): AST, ALT,  ALKPHOS, BILITOT, PROT, ALBUMIN in the last 168 hours. No results for input(s): LIPASE, AMYLASE in the last 168 hours. No results for input(s): AMMONIA in the last 168 hours. Coagulation Profile: No results for input(s): INR, PROTIME in the last 168 hours. Cardiac Enzymes: No results for input(s): CKTOTAL, CKMB, CKMBINDEX, TROPONINI in the last 168 hours. BNP (last 3 results) No results for input(s): PROBNP in the last 8760 hours. HbA1C: No results for input(s): HGBA1C in the last 72 hours. CBG: No results for input(s): GLUCAP in the last 168 hours. Lipid Profile: No results for input(s): CHOL, HDL, LDLCALC, TRIG, CHOLHDL, LDLDIRECT in the last 72 hours. Thyroid Function Tests: No results for input(s): TSH, T4TOTAL, FREET4, T3FREE, THYROIDAB in the last 72 hours. Anemia Panel: No results for input(s): VITAMINB12, FOLATE, FERRITIN, TIBC, IRON, RETICCTPCT in the last 72 hours. Urine analysis:    Component Value Date/Time   COLORURINE STRAW (A) 03/17/2018 [place] 03/17/2018 1025   LABSPEC 1.008 03/17/2018 1025   PHURINE 7.0 03/17/2018 1025   GLUCOSEU NEGATIVE 03/17/2018 [place] 03/17/2018 [place] 03/17/2018 1025   [place] 03/17/2018 1025   PROTEINUR NEGATIVE 03/17/2018  [place] 03/17/2018 [place] 03/17/2018 1025   Sepsis Labs: @LABRCNTIP (procalcitonin:4,lacticidven:4)  ) Recent Results (from the past 240 hour(s))  Urine Culture     Status: Abnormal   Collection Time: 03/17/18 10:33 AM  Result Value Ref Range Status   Specimen Description URINE, CLEAN CATCH  Final   Special Requests NONE  Final   Culture (A)  Final    <10,000 COLONIES/mL INSIGNIFICANT GROWTH Performed at [facility] [address] [place], [place] [address]    Report Status 03/18/2018 FINAL  Final  Culture, blood (routine x 2)     Status: None   Collection Time: 03/17/18 10:40 AM  Result Value Ref  Range Status   Specimen Description BLOOD LEFT HAND  Final   Special Requests   Final    BOTTLES DRAWN AEROBIC AND ANAEROBIC Blood Culture adequate volume   Culture   Final    NO GROWTH 5 DAYS Performed at [facility], [place] [address] [place], [place] [address]    Report Status 03/22/2018 FINAL  Final  Culture, blood (routine x 2)     Status: None   Collection Time: 03/17/18 10:45 AM  Result Value Ref Range Status   Specimen Description BLOOD RIGHT HAND  Final   Special Requests   Final    BOTTLES DRAWN AEROBIC AND ANAEROBIC Blood Culture adequate volume   Culture   Final    NO GROWTH 5 DAYS Performed at [facility], [place] [address], [place], [place] [address]    Report Status 03/22/2018 FINAL  Final      Radiology Studies: No results found.   Scheduled Meds: . clopidogrel  75 mg Oral Daily  . enoxaparin (LOVENOX) injection  40 mg Subcutaneous Q24H  . ferrous sulfate  325 mg Oral Q breakfast  . fluPHENAZine  4 mg Oral QHS  . lithium carbonate  300 mg Oral Q12H  . multivitamin with minerals  1 tablet Oral Daily  . traZODone  50 mg Oral QHS  . Vitamin D (Ergocalciferol)  50,000 Units Oral Q Wed   Continuous Infusions:   LOS: 9 days   Time Spent in minutes   20 minutes  Gopal Malter D.O. on 03/26/2018 at 7:14 AM  Between 7am to 7pm - Please see pager noted on [email or web address]  After 7pm go to [email or web address]  And look for the night coverage person covering for me after hours  [facility] Office  [phone]

## 2018-03-26 NOTE — Plan of Care (Signed)
  Problem: Coping: Goal: Level of anxiety will decrease Outcome: Progressing Note: No s/s of anxiety noted.   

## 2018-03-27 LAB — BASIC METABOLIC PANEL
Anion gap: 7 (ref 5–15)
BUN: 9 mg/dL (ref 8–23)
CO2: 23 mmol/L (ref 22–32)
Calcium: 9.4 mg/dL (ref 8.9–10.3)
Chloride: 113 mmol/L — ABNORMAL HIGH (ref 98–111)
Creatinine, Ser: 1.2 mg/dL — ABNORMAL HIGH (ref 0.44–1.00)
GFR calc Af Amer: 54 mL/min — ABNORMAL LOW (ref >60)
GFR, EST NON AFRICAN AMERICAN: 46 mL/min — AB (ref >60)
GLUCOSE: 101 mg/dL — AB (ref 70–99)
Potassium: 4.2 mmol/L (ref 3.5–5.1)
Sodium: 143 mmol/L (ref 135–145)

## 2018-03-27 LAB — LITHIUM LEVEL
Lithium Lvl: <0.06 mmol/L — ABNORMAL LOW (ref 0.60–1.20)
Lithium Lvl: 1.04 mmol/L (ref 0.60–1.20)

## 2018-03-27 NOTE — Progress Notes (Addendum)
PROGRESS NOTE    Bridget Jackson  HBZ:[id number] DOB: 1950/03/18 DOA: 03/17/2018 PCP: Kathyrn Lass, MD   Brief Narrative:  HPI on 03/17/2018 by Dr. Royal Piedra Bridget Jackson is a 68 y.o. 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  Her sister was initially present at bedside and provided most of the history as patient was confused.  Sister was no longer at bedside at the time of my exam.  Patient lives alone however her sister checks in on her very frequently.  According to the sister's report, for the past 3 or 4 days patient has not been her normal self.  She has been progressively more confused and just not as interactive as usual.  Yesterday she noticed that patient had some tremors which were also unusual.  Upon my examination, patient is able to wake up and answer some questions appropriately.  She states that she has been feeling confused and that she was not able to put on her close while she was in her bathroom this morning.  Her sister came and found her unable to dress herself.  Patient states that she is had no fevers, no chest pain, no shortness of breath, no GI symptoms except she has had some occasional diarrhea over the last few days.  She was unable to answer when I asked her if she is been eating and drinking normally.  She did state that she was taking her medications as prescribed, not taking any more or less.  Interim history Admitted with confusion found to have lithium toxicity with bradycardia. Pending SNF. Assessment & Plan  Acute metabolic encephalopathy -Resolved -Likely secondary to lithium toxicity (lithium was 2 times normal on admission) -CT head was unremarkable for acute findings -EKG showed lateral T wave  inversions on electrocardiogram (may be consistent with lithium toxicity)-no reports of chest pain -lithium resumed at home dose, level dropped this morning from 0.99 to <0.06- will have level redrawn- ?lab error. Will also discuss with pharmacy -PT recommended SNF  Severe symptomatic bradycardia -EP consulted and appreciated, commended avoiding lithium toxicity -Patient did require dopamine drip on 03/18/2018 which has since been discontinued -Resolved  Acute kidney injury on chronic kidney disease, stage III -Possibly prerenal due to increased oral intake versus diuretic use -Creatinine 1.8 on admission, baseline appears to be approximate 1.3-1.4 -Creatinine currently down to 1.33 -Continue to monitor BMP  Essential hypertension -Amiloride was held due to AKI as well as hyperkalemia along with hypotension on presentation -Holding off on medications, BP currently stable  Nephrogenic diabetes insipidus -Seems the patient was started on amiloride due to 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 Posey Pronto discussed with nephrology, recommended trial of HCTZ if treatment is needed but prefers oral intake and hold off on pharmacotherapy  Schizoaffective disorder -Continue fluphenazine, trazodone -Seems that sister was against replacing lithium.  Per previous documentation, patient has been on other mood stabilizing agents in the past but was unable to tolerate them including Lamictal. -Patient will need to follow-up with her psychiatrist -Previous hospitalist discussed with psychiatry they recommended the same  History of CVA -Continue Plavix  DVT Prophylaxis  Heparin  Code Status: DNR  Family Communication: None at bedside  Disposition Plan: Admitted.  Pending SNF placement- pending PASRR  Consultants Electrophysiology Nephrology and psychiatry via phone  Procedures  None  Antibiotics   Anti-infectives (From admission, onward)   None      Subjective:    Bridget Jackson seen and examined today.  Patient with no complaints today. Feeling sleepy. Denies current chest pain, shortness of breath, abdominal pain, N/V/D/C.   Objective:   Vitals:   03/26/18 0442 03/26/18 1339 03/26/18 2029 03/27/18 0517  BP: 117/64 (!) 97/46 (!) 103/44 (!) 97/50  Pulse: 60 (!) 52 63 65  Resp: 11 12    Temp: 98.1 F (36.7 C) 97.7 F (36.5 C) 97.9 F (36.6 C) (!) 97.4 F (36.3 C)  TempSrc: Oral Oral Oral Oral  SpO2: 98% 98% 97% 100%  Weight: 78.6 kg   77.8 kg  Height:        Intake/Output Summary (Last 24 hours) at 03/27/2018 0703 Last data filed at 03/27/2018 0200 Gross per 24 hour  Intake 600 ml  Output 400 ml  Net 200 ml   Filed Weights   03/26/18 0233 03/26/18 0442 03/27/18 0517  Weight: 78.4 kg 78.6 kg 77.8 kg   Exam  General: Well developed, well nourished, NAD, appears stated age  HEENT: NCAT, mucous membranes moist.   Neck: Supple  Cardiovascular: S1 S2 auscultated, no murmur, RRR  Respiratory: Clear to auscultation bilaterally with equal chest rise  Abdomen: Soft, nontender, nondistended, + bowel sounds  Extremities: warm dry without cyanosis clubbing or edema  Neuro: AAOx3, nonfocal  Psych: Appropriate mood and affect, pleasant  Data Reviewed: I have personally reviewed following labs and imaging studies  CBC: Recent Labs  Lab 03/21/18 0638 03/22/18 0359 03/23/18 0327  WBC 7.3 7.9 7.6  HGB 11.2* 11.3* 11.4*  HCT 37.0 35.8* 36.5  MCV 101.1* 100.6* 101.7*  PLT 262 257 027   Basic Metabolic Panel: Recent Labs  Lab 03/21/18 0638 03/22/18 0359 03/23/18 0327 03/25/18 0611  NA 144 142 144 143  K 5.6* 3.9 4.1 3.8  CL 118* 112* 115* 111  CO2 21* 22 24 28   GLUCOSE 100* 95 100* 103*  BUN 14 15 15 13   CREATININE 1.40* 1.34* 1.39* 1.33*  CALCIUM 9.0 9.2 9.2 9.2   GFR: Estimated Creatinine Clearance: 40 mL/min (A) (by C-G formula based on SCr of 1.33 mg/dL (H)). Liver Function Tests: No results for input(s):  AST, ALT, ALKPHOS, BILITOT, PROT, ALBUMIN in the last 168 hours. No results for input(s): LIPASE, AMYLASE in the last 168 hours. No results for input(s): AMMONIA in the last 168 hours. Coagulation Profile: No results for input(s): INR, PROTIME in the last 168 hours. Cardiac Enzymes: No results for input(s): CKTOTAL, CKMB, CKMBINDEX, TROPONINI in the last 168 hours. BNP (last 3 results) No results for input(s): PROBNP in the last 8760 hours. HbA1C: No results for input(s): HGBA1C in the last 72 hours. CBG: No results for input(s): GLUCAP in the last 168 hours. Lipid Profile: No results for input(s): CHOL, HDL, LDLCALC, TRIG, CHOLHDL, LDLDIRECT in the last 72 hours. Thyroid Function Tests: No results for input(s): TSH, T4TOTAL, FREET4, T3FREE, THYROIDAB in the last 72 hours. Anemia Panel: No results for input(s): VITAMINB12, FOLATE, FERRITIN, TIBC, IRON, RETICCTPCT in the last 72 hours. Urine analysis:    Component Value Date/Time   COLORURINE STRAW (A) 03/17/2018 [place] 03/17/2018 1025   LABSPEC 1.008 03/17/2018 1025   PHURINE 7.0 03/17/2018 1025   GLUCOSEU NEGATIVE 03/17/2018 [place] 03/17/2018 [place] 03/17/2018 1025   [place] 03/17/2018 1025   PROTEINUR NEGATIVE 03/17/2018  [place] 03/17/2018 [place] 03/17/2018 1025   Sepsis Labs: @LABRCNTIP (procalcitonin:4,lacticidven:4)  ) Recent Results (from the past 240 hour(s))  Urine Culture     Status: Abnormal   Collection Time: 03/17/18 10:33 AM  Result Value Ref Range Status   Specimen Description URINE, CLEAN CATCH  Final   Special Requests NONE  Final   Culture (A)  Final    <10,000 COLONIES/mL INSIGNIFICANT GROWTH Performed at [facility] [address], [place], [place] [address]    Report Status 03/18/2018 FINAL  Final  Culture, blood (routine x 2)     Status: None   Collection Time: 03/17/18 10:40 AM  Result  Value Ref Range Status   Specimen Description BLOOD LEFT HAND  Final   Special Requests   Final    BOTTLES DRAWN AEROBIC AND ANAEROBIC Blood Culture adequate volume   Culture   Final    NO GROWTH 5 DAYS Performed at [facility], [place] [address], [place], [place] [address]    Report Status 03/22/2018 FINAL  Final  Culture, blood (routine x 2)     Status: None   Collection Time: 03/17/18 10:45 AM  Result Value Ref Range Status   Specimen Description BLOOD RIGHT HAND  Final   Special Requests   Final    BOTTLES DRAWN AEROBIC AND ANAEROBIC Blood Culture adequate volume   Culture   Final    NO GROWTH 5 DAYS Performed at [facility], [place] [address], [place], [place] [address]    Report Status 03/22/2018 FINAL  Final      Radiology Studies: No results found.   Scheduled Meds: . clopidogrel  75 mg Oral Daily  . enoxaparin (LOVENOX) injection  40 mg Subcutaneous Q24H  . ferrous sulfate  325 mg Oral Q breakfast  . fluPHENAZine  4 mg Oral QHS  . lithium carbonate  300 mg Oral Q12H  . multivitamin with minerals  1 tablet Oral Daily  . traZODone  50 mg Oral QHS  . Vitamin D (Ergocalciferol)  50,000 Units Oral Q Wed   Continuous Infusions:   LOS: 10 days   Time Spent in minutes   30 minutes  Kyrstin Campillo D.O. on 03/27/2018 at 7:03 AM  Between 7am to 7pm - Please see pager noted on [email or web address]  After 7pm go to [email or web address]  And look for the night coverage person covering for me after hours  [facility] Office  [phone]

## 2018-03-27 NOTE — Plan of Care (Signed)
  Problem: Clinical Measurements: Goal: Will remain free from infection Outcome: Progressing Note: No s/s of infection noted. Goal: Respiratory complications will improve Outcome: Progressing Note: No s/s of respiratory complications noted.  Stable on room air.   

## 2018-03-27 NOTE — Plan of Care (Signed)
  Problem: Activity: Goal: Risk for activity intolerance will decrease Outcome: Progressing Note:  Transfers to [facility] with standby assist and walker without difficulty.

## 2018-03-28 DIAGNOSIS — Z789 Other specified health status: Secondary | ICD-10-CM | POA: Diagnosis not present

## 2018-03-28 DIAGNOSIS — F259 Schizoaffective disorder, unspecified: Secondary | ICD-10-CM | POA: Diagnosis not present

## 2018-03-28 DIAGNOSIS — I10 Essential (primary) hypertension: Secondary | ICD-10-CM | POA: Diagnosis not present

## 2018-03-28 DIAGNOSIS — N179 Acute kidney failure, unspecified: Secondary | ICD-10-CM | POA: Diagnosis not present

## 2018-03-28 DIAGNOSIS — T56891D Toxic effect of other metals, accidental (unintentional), subsequent encounter: Secondary | ICD-10-CM | POA: Diagnosis not present

## 2018-03-28 DIAGNOSIS — E785 Hyperlipidemia, unspecified: Secondary | ICD-10-CM | POA: Diagnosis not present

## 2018-03-28 DIAGNOSIS — R2689 Other abnormalities of gait and mobility: Secondary | ICD-10-CM | POA: Diagnosis not present

## 2018-03-28 DIAGNOSIS — M2550 Pain in unspecified joint: Secondary | ICD-10-CM | POA: Diagnosis not present

## 2018-03-28 DIAGNOSIS — R9431 Abnormal electrocardiogram [ECG] [EKG]: Secondary | ICD-10-CM | POA: Diagnosis not present

## 2018-03-28 DIAGNOSIS — I959 Hypotension, unspecified: Secondary | ICD-10-CM | POA: Diagnosis not present

## 2018-03-28 DIAGNOSIS — N183 Chronic kidney disease, stage 3 (moderate): Secondary | ICD-10-CM | POA: Diagnosis not present

## 2018-03-28 DIAGNOSIS — M6281 Muscle weakness (generalized): Secondary | ICD-10-CM | POA: Diagnosis not present

## 2018-03-28 DIAGNOSIS — Z7401 Bed confinement status: Secondary | ICD-10-CM | POA: Diagnosis not present

## 2018-03-28 DIAGNOSIS — G4719 Other hypersomnia: Secondary | ICD-10-CM | POA: Diagnosis not present

## 2018-03-28 DIAGNOSIS — T56891A Toxic effect of other metals, accidental (unintentional), initial encounter: Secondary | ICD-10-CM | POA: Diagnosis not present

## 2018-03-28 DIAGNOSIS — R197 Diarrhea, unspecified: Secondary | ICD-10-CM | POA: Diagnosis not present

## 2018-03-28 DIAGNOSIS — R400 Somnolence: Secondary | ICD-10-CM | POA: Diagnosis not present

## 2018-03-28 DIAGNOSIS — G9341 Metabolic encephalopathy: Secondary | ICD-10-CM | POA: Diagnosis not present

## 2018-03-28 DIAGNOSIS — R5381 Other malaise: Secondary | ICD-10-CM | POA: Diagnosis not present

## 2018-03-28 DIAGNOSIS — N251 Nephrogenic diabetes insipidus: Secondary | ICD-10-CM | POA: Diagnosis not present

## 2018-03-28 DIAGNOSIS — E875 Hyperkalemia: Secondary | ICD-10-CM | POA: Diagnosis not present

## 2018-03-28 DIAGNOSIS — G4731 Primary central sleep apnea: Secondary | ICD-10-CM | POA: Diagnosis not present

## 2018-03-28 DIAGNOSIS — Z8673 Personal history of transient ischemic attack (TIA), and cerebral infarction without residual deficits: Secondary | ICD-10-CM | POA: Diagnosis not present

## 2018-03-28 LAB — LITHIUM LEVEL: Lithium Lvl: 1.1 mmol/L (ref 0.60–1.20)

## 2018-03-28 NOTE — Plan of Care (Addendum)
Pt completed goals of care for this admission period

## 2018-03-28 NOTE — Clinical Social Work Placement (Signed)
   CLINICAL SOCIAL WORK PLACEMENT  NOTE  Date:  03/28/2018  Patient Details  Name: Bridget Jackson MRN: [id number] Date of Birth: 10/03/1950  Clinical Social Work is seeking post-discharge placement for this patient at the   level of care (*CSW will initial, date and re-position this form in  chart as items are completed):      Patient/family provided with [facility] Department's list of facilities offering this level of care within the geographic area requested by the patient (or if unable, by the patient's family).      Patient/family informed of their freedom to choose among providers that offer the needed level of care, that participate in Medicare, Medicaid or managed care program needed by the patient, have an available bed and are willing to accept the patient.      Patient/family informed of [facility]'s ownership interest in [facility] and [facility], as well as of the fact that they are under no obligation to receive care at these facilities.  PASRR submitted to EDS on 03/21/18     PASRR number received on 03/28/18     Existing PASRR number confirmed on       FL2 transmitted to all facilities in geographic area requested by pt/family on 03/21/18     FL2 transmitted to all facilities within larger geographic area on       Patient informed that his/her managed care company has contracts with or will negotiate with certain facilities, including the following:  [facility]         Patient/family informed of bed offers received.  Patient chooses bed at [facility]     Physician recommends and patient chooses bed at      Patient to be transferred to [facility] on 03/28/18.  Patient to be transferred to facility by       Patient family notified on 03/28/18 of transfer.  Name of family member notified:  Gomez Cleverly, sister     PHYSICIAN Please prepare priority discharge summary, including medications,  Please prepare prescriptions, Please sign FL2, Please sign DNR     Additional Comment:    _______________________________________________ Estanislado Emms, LCSW 03/28/2018, 11:17 AM

## 2018-03-28 NOTE — Discharge Summary (Signed)
Physician Discharge Summary  Bridget Jackson [id number] DOB: [birth year] DOA: 03/17/2018  PCP: Kathyrn Lass, MD  Admit date: 03/17/2018 Discharge date: 03/28/2018  Time spent: 45 minutes  Recommendations for Outpatient Follow-up:  Patient will be discharged to skilled nursing facility, continue physical and occupational therapy.  Patient will need to follow up with primary care provider within one week of discharge.  Patient should continue medications as prescribed.  Patient should follow a regular diet.   Discharge Diagnoses:  Principal Problem:Acute metabolic encephalopathy Severe symptomatic bradycardia Acute kidney injury on chronic kidney disease, stage III Essential hypertension Nephrogenic diabetes insipidus Schizoaffective disorder History of CVA  Discharge Condition: Stable  Diet recommendation: Regular  Filed Weights   03/26/18 0442 03/27/18 0517 03/28/18 0445  Weight: 78.6 kg 77.8 kg 77.9 kg    History of present illness:  on 03/17/2018 by Dr. 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 Her sister was initially present at bedsideand providedmost of the history as patient was confused.Sister was no longer at bedside at the time of my exam. Patient lives alone however her sister checks in on her very frequently. According to the sister's report, for the past 3 or 4 days patient has not been her normal self. She has been progressively more confused and just not as interactive as usual. Yesterday shenoticed that patient had some tremors which were also unusual. Upon my examination, patient is able to wake up and answer some questions appropriately. She states that  she has been feeling confused and that she was not able to put on her close while she was in her bathroom this morning. Her sister came and found her unable to dress herself. Patient states that she is had no fevers, no chest pain, no shortness of breath, no GI symptoms except she has had some occasional diarrhea over the last few days. She was unable to answer when I asked her if she is been eating and drinking normally. She did state that she was taking her medications as prescribed, not taking any more or less.  Hospital Course:  Acute metabolic encephalopathy -Resolved -Likely secondary to lithium toxicity (lithium was 2 times normal on admission) -CT head was unremarkable for acute findings -EKG showed lateral T wave inversions on electrocardiogram (may be consistent with lithium toxicity)-no reports of chest pain -lithium resumed at home dose, level 1.1 -PT recommended SNF  Severe symptomatic bradycardia -EP consulted and appreciated, commended avoiding lithium toxicity -Patient did require dopamine drip on 03/18/2018 which has since been discontinued -Resolved  Acute kidney injury on chronic kidney disease, stage III -Possibly prerenal due to increased oral intake versus diuretic use -Creatinine 1.8 on admission, baseline appears to be approximate 1.3-1.4 -Creatinine currently down to 1.20  Essential hypertension -Amiloride was held due to AKI as well as hyperkalemia along with hypotension on presentation -BP currently stable  Nephrogenic diabetes insipidus -Seems the patient was started on amiloride due to 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 Posey Pronto discussed with nephrology, recommended trial of HCTZ if treatment is needed but prefers oral intake and hold off on pharmacotherapy  Schizoaffective disorder -Continue fluphenazine, trazodone -Seems that sister was against replacing lithium.  Per previous documentation, patient has been on other mood  stabilizing agents in the past but was unable to tolerate them  including Lamictal. -Patient will need to follow-up with her psychiatrist -Previous hospitalist discussed with psychiatry they recommended the same  History of CVA -Continue Plavix  Consultants Electrophysiology Nephrology and psychiatry via phone  Procedures  None  Discharge Exam: Vitals:   03/27/18 2211 03/28/18 0445  BP: 100/60 (!) 108/58  Pulse: 60 62  Resp: 14 11  Temp: 97.8 F (36.6 C) 98.2 F (36.8 C)  SpO2: 99% 98%   Patient feeling better today. Has no complaints today. Ready to go to rehab. Denies current chest pain, shortness of breath, abdominal pain, N/V/D/C.    General: Well developed, well nourished, NAD, appears stated age  68: NCAT, mucous membranes moist.  Neck: Supple  Cardiovascular: S1 S2 auscultated, RRR, no murmur  Respiratory: Clear to auscultation bilaterally with equal chest rise  Abdomen: Soft, nontender, nondistended, + bowel sounds  Extremities: warm dry without cyanosis clubbing or edema  Neuro: AAOx3, nonfocal  Psych: Pleasant, appropriate mood and affect  Discharge Instructions  Allergies as of 03/28/2018      Reactions   Lipitor [atorvastatin Calcium]    Provera [medroxyprogesterone Acetate]    Prednisone    Other reaction(s): Other      Medication List    STOP taking these medications   aMILoride 5 MG tablet Commonly known as:  MIDAMOR     TAKE these medications   acetaminophen 500 MG tablet Commonly known as:  TYLENOL Take 500 mg by mouth every 8 (eight) hours as needed for mild pain.   clopidogrel 75 MG tablet Commonly known as:  PLAVIX Take 75 mg by mouth daily.   ferrous sulfate 325 (65 FE) MG tablet Take 325 mg by mouth daily with breakfast.   fluPHENAZine 1 MG tablet Commonly known as:  PROLIXIN Take 4 tablets (4 mg total) by mouth at bedtime.   lithium 300 MG tablet Take 1 tablet (300 mg total) by mouth 2 (two) times daily.     loperamide 2 MG capsule Commonly known as:  IMODIUM Take 1 capsule (2 mg total) by mouth every 8 (eight) hours as needed for diarrhea or loose stools.   MULTIVITAMIN ADULTS Tabs Take 1 tablet by mouth daily.   traZODone 50 MG tablet Commonly known as:  DESYREL Take 1 tablet (50 mg total) by mouth at bedtime.   Vitamin D (Ergocalciferol) 1.25 MG (50000 UT) Caps capsule Commonly known as:  DRISDOL Take 50,000 Units by mouth every Wednesday.      Allergies  Allergen Reactions  . Lipitor [Atorvastatin Calcium]   . Provera [Medroxyprogesterone Acetate]   . Prednisone     Other reaction(s): Other   Contact information for after-discharge care    Destination    [facility] Preferred SNF .   Service:  Skilled Nursing Contact information: [address] [place] [phone]               The results of significant diagnostics from this hospitalization (including imaging, microbiology, ancillary and laboratory) are listed below for reference.    Significant Diagnostic Studies: Dg Chest 2 View  Result Date: 03/17/2018 CLINICAL DATA:  Lethargy and disorientation for 3 days. EXAM: CHEST - 2 VIEW COMPARISON:  01/15/2018 FINDINGS: Chronic cardiomegaly and aortic tortuosity. Mildly low lung volumes. There is no edema, consolidation, effusion, or pneumothorax. IMPRESSION: No evidence of acute disease. Electronically Signed   By: Monte Fantasia M.D.   On: 03/17/2018 11:23   Ct Head Wo Contrast  Result Date: 03/17/2018 CLINICAL DATA:  Lethargy and disorientation for 3 days. History of hyponatremia. EXAM: CT HEAD WITHOUT CONTRAST TECHNIQUE: Contiguous axial images were obtained from the base of the skull through the vertex without intravenous contrast. COMPARISON:  01/13/2018 FINDINGS: Brain: No evidence of acute infarction, hemorrhage, hydrocephalus, extra-axial collection or mass lesion/mass effect. There is ventricular and sulcal enlargement  reflecting mild diffuse atrophy. Mild patchy areas of white matter hypoattenuation are also noted consistent with chronic microvascular ischemic change. Dilated perivascular space in the sub ganglionic region on the left. Vascular: No hyperdense vessel or unexpected calcification. Skull: Normal. Negative for fracture or focal lesion. Sinuses/Orbits: Globes and orbits are unremarkable. Minimal dependent fluid in the right maxillary sinus. Small amount of dependent mucus in the left maxillary sinus. Other: None. IMPRESSION: 1. No acute intracranial abnormalities. 2. Atrophy and mild chronic microvascular ischemic change, stable from the prior study. Electronically Signed   By: Lajean Manes M.D.   On: 03/17/2018 11:45   Dg Chest Port 1 View  Result Date: 03/20/2018 CLINICAL DATA:  Dyspnea EXAM: PORTABLE CHEST 1 VIEW COMPARISON:  03/17/2018 chest radiograph. FINDINGS: Stable cardiomediastinal silhouette with normal heart size. No pneumothorax. No pleural effusion. Lungs appear clear, with no acute consolidative airspace disease and no pulmonary edema. IMPRESSION: No active disease. Electronically Signed   By: Ilona Sorrel M.D.   On: 03/20/2018 16:15    Microbiology: No results found for this or any previous visit (from the past 240 hour(s)).   Labs: Basic Metabolic Panel: Recent Labs  Lab 03/22/18 0359 03/23/18 0327 03/25/18 0611 03/27/18 0720  NA 142 144 143 143  K 3.9 4.1 3.8 4.2  CL 112* 115* 111 113*  CO2 22 24 28 23   GLUCOSE 95 100* 103* 101*  BUN 15 15 13 9   CREATININE 1.34* 1.39* 1.33* 1.20*  CALCIUM 9.2 9.2 9.2 9.4   Liver Function Tests: No results for input(s): AST, ALT, ALKPHOS, BILITOT, PROT, ALBUMIN in the last 168 hours. No results for input(s): LIPASE, AMYLASE in the last 168 hours. No results for input(s): AMMONIA in the last 168 hours. CBC: Recent Labs  Lab 03/22/18 0359 03/23/18 0327  WBC 7.9 7.6  HGB 11.3* 11.4*  HCT 35.8* 36.5  MCV 100.6* 101.7*  PLT 257 236     Cardiac Enzymes: No results for input(s): CKTOTAL, CKMB, CKMBINDEX, TROPONINI in the last 168 hours. BNP: BNP (last 3 results) No results for input(s): BNP in the last 8760 hours.  ProBNP (last 3 results) No results for input(s): PROBNP in the last 8760 hours.  CBG: No results for input(s): GLUCAP in the last 168 hours.     Signed:  Cristal Ford  [facility] 03/28/2018, 11:51 AM

## 2018-03-28 NOTE — Social Work (Signed)
Patient's PASRR has been approved. Noted on FL2.  Patient will discharge to [facility] Anticipated discharge date: 03/28/2018 Family notified: Avalene Sealy, sister Transportation by: [facility]  Nurse to call report to [phone].  CSW signing off.  Estanislado Emms, [place]  Clinical Social Worker

## 2018-03-28 NOTE — Progress Notes (Signed)
Pt d/c to [facility] health care. No s/s of distress pt is stable and on room air. Report called in to [facility] LPN. Pt is transported out of hospital by [facility]

## 2018-03-28 NOTE — Progress Notes (Signed)
PROGRESS NOTE    Bridget Jackson  KDX:[id number] DOB: 1950/03/10 DOA: 03/17/2018 PCP: Kathyrn Lass, MD   Brief Narrative:  HPI on 03/17/2018 by Dr. Royal Piedra Bridget Jackson is a 68 y.o. 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  Her sister was initially present at bedside and provided most of the history as patient was confused.  Sister was no longer at bedside at the time of my exam.  Patient lives alone however her sister checks in on her very frequently.  According to the sister's report, for the past 3 or 4 days patient has not been her normal self.  She has been progressively more confused and just not as interactive as usual.  Yesterday she noticed that patient had some tremors which were also unusual.  Upon my examination, patient is able to wake up and answer some questions appropriately.  She states that she has been feeling confused and that she was not able to put on her close while she was in her bathroom this morning.  Her sister came and found her unable to dress herself.  Patient states that she is had no fevers, no chest pain, no shortness of breath, no GI symptoms except she has had some occasional diarrhea over the last few days.  She was unable to answer when I asked her if she is been eating and drinking normally.  She did state that she was taking her medications as prescribed, not taking any more or less.  Interim history Admitted with confusion found to have lithium toxicity with bradycardia. Pending SNF. Assessment & Plan  Acute metabolic encephalopathy -Resolved -Likely secondary to lithium toxicity (lithium was 2 times normal on admission) -CT head was unremarkable for acute findings -EKG showed lateral T wave  inversions on electrocardiogram (may be consistent with lithium toxicity)-no reports of chest pain -lithium resumed at home dose, level 1.1 -PT recommended SNF, pending PASSR  Severe symptomatic bradycardia -EP consulted and appreciated, commended avoiding lithium toxicity -Patient did require dopamine drip on 03/18/2018 which has since been discontinued -Resolved  Acute kidney injury on chronic kidney disease, stage III -Possibly prerenal due to increased oral intake versus diuretic use -Creatinine 1.8 on admission, baseline appears to be approximate 1.3-1.4 -Creatinine currently down to 1.20 -Continue to monitor BMP  Essential hypertension -Amiloride was held due to AKI as well as hyperkalemia along with hypotension on presentation -Holding off on medications, BP currently stable  Nephrogenic diabetes insipidus -Seems the patient was started on amiloride due to 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 Posey Pronto discussed with nephrology, recommended trial of HCTZ if treatment is needed but prefers oral intake and hold off on pharmacotherapy  Schizoaffective disorder -Continue fluphenazine, trazodone -Seems that sister was against replacing lithium.  Per previous documentation, patient has been on other mood stabilizing agents in the past but was unable to tolerate them including Lamictal. -Patient will need to follow-up with her psychiatrist -Previous hospitalist discussed with psychiatry they recommended the same  History of CVA -Continue Plavix  DVT Prophylaxis  Heparin  Code Status: DNR  Family Communication: None at bedside  Disposition Plan: Admitted.  Pending SNF placement- pending PASRR  Consultants Electrophysiology Nephrology and psychiatry via phone  Procedures  None  Antibiotics   Anti-infectives (From admission, onward)   None  Subjective:   Pretty Weltman seen and examined today.  Patient has no complaints. She is ready to go to  rehab and wondering what the hold up is. Denies chest pain, shortness of breath, abdominal pain, N/V/C/D.  Objective:   Vitals:   03/27/18 1317 03/27/18 1638 03/27/18 2211 03/28/18 0445  BP: (!) 102/57 116/75 100/60 (!) 108/58  Pulse: 65 62 60 62  Resp: 11 13 14 11   Temp: (!) 97.5 F (36.4 C)  97.8 F (36.6 C) 98.2 F (36.8 C)  TempSrc: Oral     SpO2: 98%  99% 98%  Weight:    77.9 kg  Height:        Intake/Output Summary (Last 24 hours) at 03/28/2018 0957 Last data filed at 03/28/2018 1610 Gross per 24 hour  Intake 360 ml  Output 3350 ml  Net -2990 ml   Filed Weights   03/26/18 0442 03/27/18 0517 03/28/18 0445  Weight: 78.6 kg 77.8 kg 77.9 kg   Exam  General: Well developed, well nourished, NAD, appears stated age  89: NCAT, mucous membranes moist.   Neck: Supple  Cardiovascular: S1 S2 auscultated, no murmur, RRR  Respiratory: Clear to auscultation bilaterally with equal chest rise  Abdomen: Soft, nontender, nondistended, + bowel sounds  Extremities: warm dry without cyanosis clubbing or edema  Neuro: AAOx3, nonfocal  Psych: Pleasant, appropriate mood and affect  Data Reviewed: I have personally reviewed following labs and imaging studies  CBC: Recent Labs  Lab 03/22/18 0359 03/23/18 0327  WBC 7.9 7.6  HGB 11.3* 11.4*  HCT 35.8* 36.5  MCV 100.6* 101.7*  PLT 257 960   Basic Metabolic Panel: Recent Labs  Lab 03/22/18 0359 03/23/18 0327 03/25/18 0611 03/27/18 0720  NA 142 144 143 143  K 3.9 4.1 3.8 4.2  CL 112* 115* 111 113*  CO2 22 24 28 23   GLUCOSE 95 100* 103* 101*  BUN 15 15 13 9   CREATININE 1.34* 1.39* 1.33* 1.20*  CALCIUM 9.2 9.2 9.2 9.4   GFR: Estimated Creatinine Clearance: 44.3 mL/min (A) (by C-G formula based on SCr of 1.2 mg/dL (H)). Liver Function Tests: No results for input(s): AST, ALT, ALKPHOS, BILITOT, PROT, ALBUMIN in the last 168 hours. No results for input(s): LIPASE, AMYLASE in the last 168 hours. No results for  input(s): AMMONIA in the last 168 hours. Coagulation Profile: No results for input(s): INR, PROTIME in the last 168 hours. Cardiac Enzymes: No results for input(s): CKTOTAL, CKMB, CKMBINDEX, TROPONINI in the last 168 hours. BNP (last 3 results) No results for input(s): PROBNP in the last 8760 hours. HbA1C: No results for input(s): HGBA1C in the last 72 hours. CBG: No results for input(s): GLUCAP in the last 168 hours. Lipid Profile: No results for input(s): CHOL, HDL, LDLCALC, TRIG, CHOLHDL, LDLDIRECT in the last 72 hours. Thyroid Function Tests: No results for input(s): TSH, T4TOTAL, FREET4, T3FREE, THYROIDAB in the last 72 hours. Anemia Panel: No results for input(s): VITAMINB12, FOLATE, FERRITIN, TIBC, IRON, RETICCTPCT in the last 72 hours. Urine analysis:    Component Value Date/Time   COLORURINE STRAW (A) 03/17/2018 1025   APPEARANCEUR CLEAR 03/17/2018 1025   LABSPEC 1.008 03/17/2018 1025   PHURINE 7.0 03/17/2018 1025   GLUCOSEU NEGATIVE 03/17/2018 1025   HGBUR NEGATIVE 03/17/2018 1025   [place] 03/17/2018 1025   [place] 03/17/2018 1025   PROTEINUR NEGATIVE 03/17/2018 1025   NITRITE NEGATIVE 03/17/2018 1025   LEUKOCYTESUR NEGATIVE 03/17/2018 1025   Sepsis Labs: @LABRCNTIP (procalcitonin:4,lacticidven:4)  ) No  results found for this or any previous visit (from the past 240 hour(s)).    Radiology Studies: No results found.   Scheduled Meds: . clopidogrel  75 mg Oral Daily  . enoxaparin (LOVENOX) injection  40 mg Subcutaneous Q24H  . ferrous sulfate  325 mg Oral Q breakfast  . fluPHENAZine  4 mg Oral QHS  . lithium carbonate  300 mg Oral Q12H  . multivitamin with minerals  1 tablet Oral Daily  . traZODone  50 mg Oral QHS  . Vitamin D (Ergocalciferol)  50,000 Units Oral Q Wed   Continuous Infusions:   LOS: 11 days   Time Spent in minutes   30 minutes  Raj Landress D.O. on 03/28/2018 at 9:57 AM  Between 7am to 7pm - Please see  pager noted on [email or web address]  After 7pm go to [email or web address]  And look for the night coverage person covering for me after hours  [facility] Office  [phone]

## 2018-03-30 ENCOUNTER — Other Ambulatory Visit: Payer: Self-pay | Admitting: *Deleted
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
## 2018-04-07 DIAGNOSIS — N183 Chronic kidney disease, stage 3 (moderate): Secondary | ICD-10-CM | POA: Diagnosis not present

## 2018-04-07 DIAGNOSIS — T56891D Toxic effect of other metals, accidental (unintentional), subsequent encounter: Secondary | ICD-10-CM | POA: Diagnosis not present

## 2018-04-07 DIAGNOSIS — F259 Schizoaffective disorder, unspecified: Secondary | ICD-10-CM | POA: Diagnosis not present

## 2018-04-07 DIAGNOSIS — I10 Essential (primary) hypertension: Secondary | ICD-10-CM | POA: Diagnosis not present

## 2018-04-13 DIAGNOSIS — R5381 Other malaise: Secondary | ICD-10-CM | POA: Diagnosis not present

## 2018-04-13 DIAGNOSIS — R197 Diarrhea, unspecified: Secondary | ICD-10-CM | POA: Diagnosis not present

## 2018-04-13 DIAGNOSIS — F259 Schizoaffective disorder, unspecified: Secondary | ICD-10-CM | POA: Diagnosis not present

## 2018-04-14 DIAGNOSIS — F259 Schizoaffective disorder, unspecified: Secondary | ICD-10-CM | POA: Diagnosis not present

## 2018-04-14 DIAGNOSIS — N183 Chronic kidney disease, stage 3 (moderate): Secondary | ICD-10-CM | POA: Diagnosis not present

## 2018-04-14 DIAGNOSIS — G9341 Metabolic encephalopathy: Secondary | ICD-10-CM | POA: Diagnosis not present

## 2018-04-14 DIAGNOSIS — R197 Diarrhea, unspecified: Secondary | ICD-10-CM | POA: Diagnosis not present

## 2018-04-18 DIAGNOSIS — M6281 Muscle weakness (generalized): Secondary | ICD-10-CM | POA: Diagnosis not present

## 2018-04-18 DIAGNOSIS — F259 Schizoaffective disorder, unspecified: Secondary | ICD-10-CM | POA: Diagnosis not present

## 2018-04-18 DIAGNOSIS — I10 Essential (primary) hypertension: Secondary | ICD-10-CM | POA: Diagnosis not present

## 2018-04-18 DIAGNOSIS — G9341 Metabolic encephalopathy: Secondary | ICD-10-CM | POA: Diagnosis not present

## 2018-04-18 DIAGNOSIS — N183 Chronic kidney disease, stage 3 (moderate): Secondary | ICD-10-CM | POA: Diagnosis not present

## 2018-04-19 DIAGNOSIS — T43595D Adverse effect of other antipsychotics and neuroleptics, subsequent encounter: Secondary | ICD-10-CM | POA: Diagnosis not present

## 2018-04-19 DIAGNOSIS — Z87440 Personal history of urinary (tract) infections: Secondary | ICD-10-CM | POA: Diagnosis not present

## 2018-04-19 DIAGNOSIS — D6489 Other specified anemias: Secondary | ICD-10-CM | POA: Diagnosis not present

## 2018-04-19 DIAGNOSIS — I129 Hypertensive chronic kidney disease with stage 1 through stage 4 chronic kidney disease, or unspecified chronic kidney disease: Secondary | ICD-10-CM | POA: Diagnosis not present

## 2018-04-19 DIAGNOSIS — E7849 Other hyperlipidemia: Secondary | ICD-10-CM | POA: Diagnosis not present

## 2018-04-19 DIAGNOSIS — R001 Bradycardia, unspecified: Secondary | ICD-10-CM | POA: Diagnosis not present

## 2018-04-19 DIAGNOSIS — Z9181 History of falling: Secondary | ICD-10-CM | POA: Diagnosis not present

## 2018-04-19 DIAGNOSIS — G4731 Primary central sleep apnea: Secondary | ICD-10-CM | POA: Diagnosis not present

## 2018-04-19 DIAGNOSIS — D631 Anemia in chronic kidney disease: Secondary | ICD-10-CM | POA: Diagnosis not present

## 2018-04-19 DIAGNOSIS — N183 Chronic kidney disease, stage 3 (moderate): Secondary | ICD-10-CM | POA: Diagnosis not present

## 2018-04-19 DIAGNOSIS — G4733 Obstructive sleep apnea (adult) (pediatric): Secondary | ICD-10-CM | POA: Diagnosis not present

## 2018-04-19 DIAGNOSIS — I69354 Hemiplegia and hemiparesis following cerebral infarction affecting left non-dominant side: Secondary | ICD-10-CM | POA: Diagnosis not present

## 2018-04-19 DIAGNOSIS — M48061 Spinal stenosis, lumbar region without neurogenic claudication: Secondary | ICD-10-CM | POA: Diagnosis not present

## 2018-04-19 DIAGNOSIS — Z7902 Long term (current) use of antithrombotics/antiplatelets: Secondary | ICD-10-CM | POA: Diagnosis not present

## 2018-04-19 DIAGNOSIS — M7552 Bursitis of left shoulder: Secondary | ICD-10-CM | POA: Diagnosis not present

## 2018-04-19 DIAGNOSIS — M47816 Spondylosis without myelopathy or radiculopathy, lumbar region: Secondary | ICD-10-CM | POA: Diagnosis not present

## 2018-04-19 DIAGNOSIS — F259 Schizoaffective disorder, unspecified: Secondary | ICD-10-CM | POA: Diagnosis not present

## 2018-04-19 DIAGNOSIS — N251 Nephrogenic diabetes insipidus: Secondary | ICD-10-CM | POA: Diagnosis not present

## 2018-04-19 DIAGNOSIS — N178 Other acute kidney failure: Secondary | ICD-10-CM | POA: Diagnosis not present

## 2018-04-20 DIAGNOSIS — N178 Other acute kidney failure: Secondary | ICD-10-CM | POA: Diagnosis not present

## 2018-04-20 DIAGNOSIS — R001 Bradycardia, unspecified: Secondary | ICD-10-CM | POA: Diagnosis not present

## 2018-04-20 DIAGNOSIS — T43595D Adverse effect of other antipsychotics and neuroleptics, subsequent encounter: Secondary | ICD-10-CM | POA: Diagnosis not present

## 2018-04-20 DIAGNOSIS — N251 Nephrogenic diabetes insipidus: Secondary | ICD-10-CM | POA: Diagnosis not present

## 2018-04-20 DIAGNOSIS — N183 Chronic kidney disease, stage 3 (moderate): Secondary | ICD-10-CM | POA: Diagnosis not present

## 2018-04-20 DIAGNOSIS — I129 Hypertensive chronic kidney disease with stage 1 through stage 4 chronic kidney disease, or unspecified chronic kidney disease: Secondary | ICD-10-CM | POA: Diagnosis not present

## 2018-04-26 DIAGNOSIS — N183 Chronic kidney disease, stage 3 (moderate): Secondary | ICD-10-CM | POA: Diagnosis not present

## 2018-04-26 DIAGNOSIS — T43595D Adverse effect of other antipsychotics and neuroleptics, subsequent encounter: Secondary | ICD-10-CM | POA: Diagnosis not present

## 2018-04-26 DIAGNOSIS — I129 Hypertensive chronic kidney disease with stage 1 through stage 4 chronic kidney disease, or unspecified chronic kidney disease: Secondary | ICD-10-CM | POA: Diagnosis not present

## 2018-04-26 DIAGNOSIS — N178 Other acute kidney failure: Secondary | ICD-10-CM | POA: Diagnosis not present

## 2018-04-26 DIAGNOSIS — R001 Bradycardia, unspecified: Secondary | ICD-10-CM | POA: Diagnosis not present

## 2018-04-26 DIAGNOSIS — N251 Nephrogenic diabetes insipidus: Secondary | ICD-10-CM | POA: Diagnosis not present

## 2018-04-29 DIAGNOSIS — T43595D Adverse effect of other antipsychotics and neuroleptics, subsequent encounter: Secondary | ICD-10-CM | POA: Diagnosis not present

## 2018-04-29 DIAGNOSIS — N178 Other acute kidney failure: Secondary | ICD-10-CM | POA: Diagnosis not present

## 2018-04-29 DIAGNOSIS — R001 Bradycardia, unspecified: Secondary | ICD-10-CM | POA: Diagnosis not present

## 2018-04-29 DIAGNOSIS — I129 Hypertensive chronic kidney disease with stage 1 through stage 4 chronic kidney disease, or unspecified chronic kidney disease: Secondary | ICD-10-CM | POA: Diagnosis not present

## 2018-04-29 DIAGNOSIS — N183 Chronic kidney disease, stage 3 (moderate): Secondary | ICD-10-CM | POA: Diagnosis not present

## 2018-04-29 DIAGNOSIS — N251 Nephrogenic diabetes insipidus: Secondary | ICD-10-CM | POA: Diagnosis not present

## 2018-05-02 DIAGNOSIS — I129 Hypertensive chronic kidney disease with stage 1 through stage 4 chronic kidney disease, or unspecified chronic kidney disease: Secondary | ICD-10-CM | POA: Diagnosis not present

## 2018-05-02 DIAGNOSIS — N178 Other acute kidney failure: Secondary | ICD-10-CM | POA: Diagnosis not present

## 2018-05-02 DIAGNOSIS — N251 Nephrogenic diabetes insipidus: Secondary | ICD-10-CM | POA: Diagnosis not present

## 2018-05-02 DIAGNOSIS — R001 Bradycardia, unspecified: Secondary | ICD-10-CM | POA: Diagnosis not present

## 2018-05-02 DIAGNOSIS — N183 Chronic kidney disease, stage 3 (moderate): Secondary | ICD-10-CM | POA: Diagnosis not present

## 2018-05-02 DIAGNOSIS — T43595D Adverse effect of other antipsychotics and neuroleptics, subsequent encounter: Secondary | ICD-10-CM | POA: Diagnosis not present

## 2018-05-04 ENCOUNTER — Telehealth: Payer: Self-pay | Admitting: Psychiatry
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
## 2018-05-05 DIAGNOSIS — T43595D Adverse effect of other antipsychotics and neuroleptics, subsequent encounter: Secondary | ICD-10-CM | POA: Diagnosis not present

## 2018-05-05 DIAGNOSIS — I129 Hypertensive chronic kidney disease with stage 1 through stage 4 chronic kidney disease, or unspecified chronic kidney disease: Secondary | ICD-10-CM | POA: Diagnosis not present

## 2018-05-05 DIAGNOSIS — N251 Nephrogenic diabetes insipidus: Secondary | ICD-10-CM | POA: Diagnosis not present

## 2018-05-05 DIAGNOSIS — N183 Chronic kidney disease, stage 3 (moderate): Secondary | ICD-10-CM | POA: Diagnosis not present

## 2018-05-05 DIAGNOSIS — R001 Bradycardia, unspecified: Secondary | ICD-10-CM | POA: Diagnosis not present

## 2018-05-05 DIAGNOSIS — N178 Other acute kidney failure: Secondary | ICD-10-CM | POA: Diagnosis not present

## 2018-05-05 NOTE — Telephone Encounter (Signed)
Left voicemail for [name] to call back with some more information to the office
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
## 2018-05-09 DIAGNOSIS — T43595D Adverse effect of other antipsychotics and neuroleptics, subsequent encounter: Secondary | ICD-10-CM | POA: Diagnosis not present

## 2018-05-09 DIAGNOSIS — N251 Nephrogenic diabetes insipidus: Secondary | ICD-10-CM | POA: Diagnosis not present

## 2018-05-09 DIAGNOSIS — N183 Chronic kidney disease, stage 3 (moderate): Secondary | ICD-10-CM | POA: Diagnosis not present

## 2018-05-09 DIAGNOSIS — I129 Hypertensive chronic kidney disease with stage 1 through stage 4 chronic kidney disease, or unspecified chronic kidney disease: Secondary | ICD-10-CM | POA: Diagnosis not present

## 2018-05-09 DIAGNOSIS — R001 Bradycardia, unspecified: Secondary | ICD-10-CM | POA: Diagnosis not present

## 2018-05-09 DIAGNOSIS — N178 Other acute kidney failure: Secondary | ICD-10-CM | POA: Diagnosis not present

## 2018-05-12 DIAGNOSIS — R001 Bradycardia, unspecified: Secondary | ICD-10-CM | POA: Diagnosis not present

## 2018-05-12 DIAGNOSIS — T43595D Adverse effect of other antipsychotics and neuroleptics, subsequent encounter: Secondary | ICD-10-CM | POA: Diagnosis not present

## 2018-05-12 DIAGNOSIS — N178 Other acute kidney failure: Secondary | ICD-10-CM | POA: Diagnosis not present

## 2018-05-12 DIAGNOSIS — I129 Hypertensive chronic kidney disease with stage 1 through stage 4 chronic kidney disease, or unspecified chronic kidney disease: Secondary | ICD-10-CM | POA: Diagnosis not present

## 2018-05-12 DIAGNOSIS — N251 Nephrogenic diabetes insipidus: Secondary | ICD-10-CM | POA: Diagnosis not present

## 2018-05-12 DIAGNOSIS — N183 Chronic kidney disease, stage 3 (moderate): Secondary | ICD-10-CM | POA: Diagnosis not present

## 2018-05-16 DIAGNOSIS — T43595D Adverse effect of other antipsychotics and neuroleptics, subsequent encounter: Secondary | ICD-10-CM | POA: Diagnosis not present

## 2018-05-16 DIAGNOSIS — N178 Other acute kidney failure: Secondary | ICD-10-CM | POA: Diagnosis not present

## 2018-05-16 DIAGNOSIS — N251 Nephrogenic diabetes insipidus: Secondary | ICD-10-CM | POA: Diagnosis not present

## 2018-05-16 DIAGNOSIS — I129 Hypertensive chronic kidney disease with stage 1 through stage 4 chronic kidney disease, or unspecified chronic kidney disease: Secondary | ICD-10-CM | POA: Diagnosis not present

## 2018-05-16 DIAGNOSIS — R001 Bradycardia, unspecified: Secondary | ICD-10-CM | POA: Diagnosis not present

## 2018-05-16 DIAGNOSIS — N183 Chronic kidney disease, stage 3 (moderate): Secondary | ICD-10-CM | POA: Diagnosis not present

## 2018-05-16 NOTE — Telephone Encounter (Signed)
Spoke with Bridget Jackson, nursing was going in weekly to check pt's medications. They have since discharged since everything seems to be okay with her currently. If there is an emergent need, such as pt's behavior is off they are able to go in as a nursing visit and draw labs with a new order. Physical Therapy is still going in for a few more weeks.  Will contact sister Bridget Jackson and let us know if she feels anything is going on with her since she talks to her throughout the day on a daily basis.

## 2018-05-16 NOTE — Telephone Encounter (Signed)
Spoke with sister [name] and she doesn't understand why [facility] can't just draw labs, explained as far as Medicare paying there has to be justification. When I spoke with sister last time she said she just wanted to monitor pt's condition then call if she felt labs needed to be drawn. [name] wants to know if we can order labs if she can find someone to draw them, let her know we can definitely order labs and can reorder a nursing visit if she felt it needed to be done. Her PCP doesn't want to order labs because they are not following her lithium and asked if this provider could order a BMP along with lithium.

## 2018-05-17 DIAGNOSIS — N251 Nephrogenic diabetes insipidus: Secondary | ICD-10-CM | POA: Diagnosis not present

## 2018-05-17 DIAGNOSIS — R001 Bradycardia, unspecified: Secondary | ICD-10-CM | POA: Diagnosis not present

## 2018-05-17 DIAGNOSIS — N183 Chronic kidney disease, stage 3 (moderate): Secondary | ICD-10-CM | POA: Diagnosis not present

## 2018-05-17 DIAGNOSIS — I129 Hypertensive chronic kidney disease with stage 1 through stage 4 chronic kidney disease, or unspecified chronic kidney disease: Secondary | ICD-10-CM | POA: Diagnosis not present

## 2018-05-17 DIAGNOSIS — N178 Other acute kidney failure: Secondary | ICD-10-CM | POA: Diagnosis not present

## 2018-05-17 DIAGNOSIS — T43595D Adverse effect of other antipsychotics and neuroleptics, subsequent encounter: Secondary | ICD-10-CM | POA: Diagnosis not present

## 2018-05-19 DIAGNOSIS — N251 Nephrogenic diabetes insipidus: Secondary | ICD-10-CM | POA: Diagnosis not present

## 2018-05-19 DIAGNOSIS — E7849 Other hyperlipidemia: Secondary | ICD-10-CM | POA: Diagnosis not present

## 2018-05-19 DIAGNOSIS — N178 Other acute kidney failure: Secondary | ICD-10-CM | POA: Diagnosis not present

## 2018-05-19 DIAGNOSIS — M48061 Spinal stenosis, lumbar region without neurogenic claudication: Secondary | ICD-10-CM | POA: Diagnosis not present

## 2018-05-19 DIAGNOSIS — I69354 Hemiplegia and hemiparesis following cerebral infarction affecting left non-dominant side: Secondary | ICD-10-CM | POA: Diagnosis not present

## 2018-05-19 DIAGNOSIS — N183 Chronic kidney disease, stage 3 (moderate): Secondary | ICD-10-CM | POA: Diagnosis not present

## 2018-05-19 DIAGNOSIS — G4731 Primary central sleep apnea: Secondary | ICD-10-CM | POA: Diagnosis not present

## 2018-05-19 DIAGNOSIS — Z9181 History of falling: Secondary | ICD-10-CM | POA: Diagnosis not present

## 2018-05-19 DIAGNOSIS — I129 Hypertensive chronic kidney disease with stage 1 through stage 4 chronic kidney disease, or unspecified chronic kidney disease: Secondary | ICD-10-CM | POA: Diagnosis not present

## 2018-05-19 DIAGNOSIS — T43595D Adverse effect of other antipsychotics and neuroleptics, subsequent encounter: Secondary | ICD-10-CM | POA: Diagnosis not present

## 2018-05-19 DIAGNOSIS — F259 Schizoaffective disorder, unspecified: Secondary | ICD-10-CM | POA: Diagnosis not present

## 2018-05-19 DIAGNOSIS — Z7902 Long term (current) use of antithrombotics/antiplatelets: Secondary | ICD-10-CM | POA: Diagnosis not present

## 2018-05-19 DIAGNOSIS — G4733 Obstructive sleep apnea (adult) (pediatric): Secondary | ICD-10-CM | POA: Diagnosis not present

## 2018-05-19 DIAGNOSIS — D631 Anemia in chronic kidney disease: Secondary | ICD-10-CM | POA: Diagnosis not present

## 2018-05-19 DIAGNOSIS — D6489 Other specified anemias: Secondary | ICD-10-CM | POA: Diagnosis not present

## 2018-05-19 DIAGNOSIS — R001 Bradycardia, unspecified: Secondary | ICD-10-CM | POA: Diagnosis not present

## 2018-05-19 DIAGNOSIS — Z87440 Personal history of urinary (tract) infections: Secondary | ICD-10-CM | POA: Diagnosis not present

## 2018-05-19 DIAGNOSIS — M7552 Bursitis of left shoulder: Secondary | ICD-10-CM | POA: Diagnosis not present

## 2018-05-19 DIAGNOSIS — M47816 Spondylosis without myelopathy or radiculopathy, lumbar region: Secondary | ICD-10-CM | POA: Diagnosis not present

## 2018-05-25 DIAGNOSIS — N178 Other acute kidney failure: Secondary | ICD-10-CM | POA: Diagnosis not present

## 2018-05-25 DIAGNOSIS — T43595D Adverse effect of other antipsychotics and neuroleptics, subsequent encounter: Secondary | ICD-10-CM | POA: Diagnosis not present

## 2018-05-25 DIAGNOSIS — N183 Chronic kidney disease, stage 3 (moderate): Secondary | ICD-10-CM | POA: Diagnosis not present

## 2018-05-25 DIAGNOSIS — I129 Hypertensive chronic kidney disease with stage 1 through stage 4 chronic kidney disease, or unspecified chronic kidney disease: Secondary | ICD-10-CM | POA: Diagnosis not present

## 2018-05-25 DIAGNOSIS — R001 Bradycardia, unspecified: Secondary | ICD-10-CM | POA: Diagnosis not present

## 2018-05-25 DIAGNOSIS — N251 Nephrogenic diabetes insipidus: Secondary | ICD-10-CM | POA: Diagnosis not present

## 2018-05-31 DIAGNOSIS — N183 Chronic kidney disease, stage 3 (moderate): Secondary | ICD-10-CM | POA: Diagnosis not present

## 2018-05-31 DIAGNOSIS — R001 Bradycardia, unspecified: Secondary | ICD-10-CM | POA: Diagnosis not present

## 2018-05-31 DIAGNOSIS — N251 Nephrogenic diabetes insipidus: Secondary | ICD-10-CM | POA: Diagnosis not present

## 2018-05-31 DIAGNOSIS — T43595D Adverse effect of other antipsychotics and neuroleptics, subsequent encounter: Secondary | ICD-10-CM | POA: Diagnosis not present

## 2018-05-31 DIAGNOSIS — N178 Other acute kidney failure: Secondary | ICD-10-CM | POA: Diagnosis not present

## 2018-05-31 DIAGNOSIS — I129 Hypertensive chronic kidney disease with stage 1 through stage 4 chronic kidney disease, or unspecified chronic kidney disease: Secondary | ICD-10-CM | POA: Diagnosis not present

## 2018-06-01 DIAGNOSIS — I129 Hypertensive chronic kidney disease with stage 1 through stage 4 chronic kidney disease, or unspecified chronic kidney disease: Secondary | ICD-10-CM | POA: Diagnosis not present

## 2018-06-01 DIAGNOSIS — R001 Bradycardia, unspecified: Secondary | ICD-10-CM | POA: Diagnosis not present

## 2018-06-01 DIAGNOSIS — N251 Nephrogenic diabetes insipidus: Secondary | ICD-10-CM | POA: Diagnosis not present

## 2018-06-01 DIAGNOSIS — T43595D Adverse effect of other antipsychotics and neuroleptics, subsequent encounter: Secondary | ICD-10-CM | POA: Diagnosis not present

## 2018-06-01 DIAGNOSIS — N183 Chronic kidney disease, stage 3 (moderate): Secondary | ICD-10-CM | POA: Diagnosis not present

## 2018-06-01 DIAGNOSIS — N178 Other acute kidney failure: Secondary | ICD-10-CM | POA: Diagnosis not present

## 2018-06-02 DIAGNOSIS — R001 Bradycardia, unspecified: Secondary | ICD-10-CM | POA: Diagnosis not present

## 2018-06-02 DIAGNOSIS — N178 Other acute kidney failure: Secondary | ICD-10-CM | POA: Diagnosis not present

## 2018-06-02 DIAGNOSIS — N251 Nephrogenic diabetes insipidus: Secondary | ICD-10-CM | POA: Diagnosis not present

## 2018-06-02 DIAGNOSIS — N183 Chronic kidney disease, stage 3 (moderate): Secondary | ICD-10-CM | POA: Diagnosis not present

## 2018-06-02 DIAGNOSIS — T43595D Adverse effect of other antipsychotics and neuroleptics, subsequent encounter: Secondary | ICD-10-CM | POA: Diagnosis not present

## 2018-06-02 DIAGNOSIS — I129 Hypertensive chronic kidney disease with stage 1 through stage 4 chronic kidney disease, or unspecified chronic kidney disease: Secondary | ICD-10-CM | POA: Diagnosis not present

## 2018-06-07 DIAGNOSIS — R001 Bradycardia, unspecified: Secondary | ICD-10-CM | POA: Diagnosis not present

## 2018-06-07 DIAGNOSIS — T43595D Adverse effect of other antipsychotics and neuroleptics, subsequent encounter: Secondary | ICD-10-CM | POA: Diagnosis not present

## 2018-06-07 DIAGNOSIS — I129 Hypertensive chronic kidney disease with stage 1 through stage 4 chronic kidney disease, or unspecified chronic kidney disease: Secondary | ICD-10-CM | POA: Diagnosis not present

## 2018-06-07 DIAGNOSIS — N178 Other acute kidney failure: Secondary | ICD-10-CM | POA: Diagnosis not present

## 2018-06-07 DIAGNOSIS — N251 Nephrogenic diabetes insipidus: Secondary | ICD-10-CM | POA: Diagnosis not present

## 2018-06-07 DIAGNOSIS — N183 Chronic kidney disease, stage 3 (moderate): Secondary | ICD-10-CM | POA: Diagnosis not present

## 2018-06-08 DIAGNOSIS — N251 Nephrogenic diabetes insipidus: Secondary | ICD-10-CM | POA: Diagnosis not present

## 2018-06-08 DIAGNOSIS — N178 Other acute kidney failure: Secondary | ICD-10-CM | POA: Diagnosis not present

## 2018-06-08 DIAGNOSIS — R001 Bradycardia, unspecified: Secondary | ICD-10-CM | POA: Diagnosis not present

## 2018-06-08 DIAGNOSIS — N183 Chronic kidney disease, stage 3 (moderate): Secondary | ICD-10-CM | POA: Diagnosis not present

## 2018-06-08 DIAGNOSIS — I129 Hypertensive chronic kidney disease with stage 1 through stage 4 chronic kidney disease, or unspecified chronic kidney disease: Secondary | ICD-10-CM | POA: Diagnosis not present

## 2018-06-08 DIAGNOSIS — T43595D Adverse effect of other antipsychotics and neuroleptics, subsequent encounter: Secondary | ICD-10-CM | POA: Diagnosis not present

## 2018-06-09 DIAGNOSIS — N251 Nephrogenic diabetes insipidus: Secondary | ICD-10-CM | POA: Diagnosis not present

## 2018-06-09 DIAGNOSIS — I129 Hypertensive chronic kidney disease with stage 1 through stage 4 chronic kidney disease, or unspecified chronic kidney disease: Secondary | ICD-10-CM | POA: Diagnosis not present

## 2018-06-09 DIAGNOSIS — T43595D Adverse effect of other antipsychotics and neuroleptics, subsequent encounter: Secondary | ICD-10-CM | POA: Diagnosis not present

## 2018-06-09 DIAGNOSIS — N178 Other acute kidney failure: Secondary | ICD-10-CM | POA: Diagnosis not present

## 2018-06-09 DIAGNOSIS — N183 Chronic kidney disease, stage 3 (moderate): Secondary | ICD-10-CM | POA: Diagnosis not present

## 2018-06-09 DIAGNOSIS — R001 Bradycardia, unspecified: Secondary | ICD-10-CM | POA: Diagnosis not present

## 2018-06-14 DIAGNOSIS — N178 Other acute kidney failure: Secondary | ICD-10-CM | POA: Diagnosis not present

## 2018-06-14 DIAGNOSIS — N251 Nephrogenic diabetes insipidus: Secondary | ICD-10-CM | POA: Diagnosis not present

## 2018-06-14 DIAGNOSIS — R001 Bradycardia, unspecified: Secondary | ICD-10-CM | POA: Diagnosis not present

## 2018-06-14 DIAGNOSIS — N183 Chronic kidney disease, stage 3 (moderate): Secondary | ICD-10-CM | POA: Diagnosis not present

## 2018-06-14 DIAGNOSIS — I129 Hypertensive chronic kidney disease with stage 1 through stage 4 chronic kidney disease, or unspecified chronic kidney disease: Secondary | ICD-10-CM | POA: Diagnosis not present

## 2018-06-14 DIAGNOSIS — T43595D Adverse effect of other antipsychotics and neuroleptics, subsequent encounter: Secondary | ICD-10-CM | POA: Diagnosis not present

## 2018-06-16 DIAGNOSIS — N178 Other acute kidney failure: Secondary | ICD-10-CM | POA: Diagnosis not present

## 2018-06-16 DIAGNOSIS — N183 Chronic kidney disease, stage 3 (moderate): Secondary | ICD-10-CM | POA: Diagnosis not present

## 2018-06-16 DIAGNOSIS — I129 Hypertensive chronic kidney disease with stage 1 through stage 4 chronic kidney disease, or unspecified chronic kidney disease: Secondary | ICD-10-CM | POA: Diagnosis not present

## 2018-06-16 DIAGNOSIS — R001 Bradycardia, unspecified: Secondary | ICD-10-CM | POA: Diagnosis not present

## 2018-06-16 DIAGNOSIS — N251 Nephrogenic diabetes insipidus: Secondary | ICD-10-CM | POA: Diagnosis not present

## 2018-06-16 DIAGNOSIS — T43595D Adverse effect of other antipsychotics and neuroleptics, subsequent encounter: Secondary | ICD-10-CM | POA: Diagnosis not present

## 2018-06-20 DIAGNOSIS — R2689 Other abnormalities of gait and mobility: Secondary | ICD-10-CM | POA: Diagnosis not present

## 2018-06-20 DIAGNOSIS — R52 Pain, unspecified: Secondary | ICD-10-CM | POA: Diagnosis not present

## 2018-06-20 DIAGNOSIS — R279 Unspecified lack of coordination: Secondary | ICD-10-CM | POA: Diagnosis not present

## 2018-06-23 DIAGNOSIS — R52 Pain, unspecified: Secondary | ICD-10-CM | POA: Diagnosis not present

## 2018-06-23 DIAGNOSIS — R279 Unspecified lack of coordination: Secondary | ICD-10-CM | POA: Diagnosis not present

## 2018-06-23 DIAGNOSIS — R2689 Other abnormalities of gait and mobility: Secondary | ICD-10-CM | POA: Diagnosis not present

## 2018-06-28 ENCOUNTER — Other Ambulatory Visit: Payer: Self-pay

## 2018-06-28 DIAGNOSIS — F259 Schizoaffective disorder, unspecified: Secondary | ICD-10-CM

## 2018-06-28 DIAGNOSIS — R279 Unspecified lack of coordination: Secondary | ICD-10-CM | POA: Diagnosis not present

## 2018-06-28 DIAGNOSIS — R2689 Other abnormalities of gait and mobility: Secondary | ICD-10-CM | POA: Diagnosis not present

## 2018-06-28 DIAGNOSIS — R52 Pain, unspecified: Secondary | ICD-10-CM | POA: Diagnosis not present

## 2018-06-28 MED ORDER — LITHIUM CARBONATE 300 MG PO TABS: 300.0000 mg | ORAL_TABLET | Freq: Two times a day (BID) | ORAL | 0 refills | Status: DC

## 2018-06-29 DIAGNOSIS — R279 Unspecified lack of coordination: Secondary | ICD-10-CM | POA: Diagnosis not present

## 2018-06-29 DIAGNOSIS — R2689 Other abnormalities of gait and mobility: Secondary | ICD-10-CM | POA: Diagnosis not present

## 2018-06-29 DIAGNOSIS — R52 Pain, unspecified: Secondary | ICD-10-CM | POA: Diagnosis not present

## 2018-06-30 DIAGNOSIS — R52 Pain, unspecified: Secondary | ICD-10-CM | POA: Diagnosis not present

## 2018-06-30 DIAGNOSIS — R2689 Other abnormalities of gait and mobility: Secondary | ICD-10-CM | POA: Diagnosis not present

## 2018-06-30 DIAGNOSIS — R279 Unspecified lack of coordination: Secondary | ICD-10-CM | POA: Diagnosis not present

## 2018-07-01 DIAGNOSIS — R279 Unspecified lack of coordination: Secondary | ICD-10-CM | POA: Diagnosis not present

## 2018-07-01 DIAGNOSIS — R52 Pain, unspecified: Secondary | ICD-10-CM | POA: Diagnosis not present

## 2018-07-01 DIAGNOSIS — R2689 Other abnormalities of gait and mobility: Secondary | ICD-10-CM | POA: Diagnosis not present

## 2018-07-04 DIAGNOSIS — R2689 Other abnormalities of gait and mobility: Secondary | ICD-10-CM | POA: Diagnosis not present

## 2018-07-04 DIAGNOSIS — R279 Unspecified lack of coordination: Secondary | ICD-10-CM | POA: Diagnosis not present

## 2018-07-04 DIAGNOSIS — R52 Pain, unspecified: Secondary | ICD-10-CM | POA: Diagnosis not present

## 2018-07-05 DIAGNOSIS — R2689 Other abnormalities of gait and mobility: Secondary | ICD-10-CM | POA: Diagnosis not present

## 2018-07-05 DIAGNOSIS — R279 Unspecified lack of coordination: Secondary | ICD-10-CM | POA: Diagnosis not present

## 2018-07-05 DIAGNOSIS — R52 Pain, unspecified: Secondary | ICD-10-CM | POA: Diagnosis not present

## 2018-07-06 ENCOUNTER — Ambulatory Visit: Payer: Medicare Other | Admitting: Psychiatry

## 2018-07-07 DIAGNOSIS — R52 Pain, unspecified: Secondary | ICD-10-CM | POA: Diagnosis not present

## 2018-07-07 DIAGNOSIS — R2689 Other abnormalities of gait and mobility: Secondary | ICD-10-CM | POA: Diagnosis not present

## 2018-07-07 DIAGNOSIS — R279 Unspecified lack of coordination: Secondary | ICD-10-CM | POA: Diagnosis not present

## 2018-07-08 DIAGNOSIS — R52 Pain, unspecified: Secondary | ICD-10-CM | POA: Diagnosis not present

## 2018-07-08 DIAGNOSIS — R279 Unspecified lack of coordination: Secondary | ICD-10-CM | POA: Diagnosis not present

## 2018-07-08 DIAGNOSIS — R2689 Other abnormalities of gait and mobility: Secondary | ICD-10-CM | POA: Diagnosis not present

## 2018-07-11 DIAGNOSIS — R52 Pain, unspecified: Secondary | ICD-10-CM | POA: Diagnosis not present

## 2018-07-11 DIAGNOSIS — R2689 Other abnormalities of gait and mobility: Secondary | ICD-10-CM | POA: Diagnosis not present

## 2018-07-11 DIAGNOSIS — R279 Unspecified lack of coordination: Secondary | ICD-10-CM | POA: Diagnosis not present

## 2018-07-12 DIAGNOSIS — R52 Pain, unspecified: Secondary | ICD-10-CM | POA: Diagnosis not present

## 2018-07-12 DIAGNOSIS — R2689 Other abnormalities of gait and mobility: Secondary | ICD-10-CM | POA: Diagnosis not present

## 2018-07-12 DIAGNOSIS — R279 Unspecified lack of coordination: Secondary | ICD-10-CM | POA: Diagnosis not present

## 2018-07-14 DIAGNOSIS — R2689 Other abnormalities of gait and mobility: Secondary | ICD-10-CM | POA: Diagnosis not present

## 2018-07-14 DIAGNOSIS — R279 Unspecified lack of coordination: Secondary | ICD-10-CM | POA: Diagnosis not present

## 2018-07-14 DIAGNOSIS — R52 Pain, unspecified: Secondary | ICD-10-CM | POA: Diagnosis not present

## 2018-07-15 DIAGNOSIS — R2689 Other abnormalities of gait and mobility: Secondary | ICD-10-CM | POA: Diagnosis not present

## 2018-07-15 DIAGNOSIS — R52 Pain, unspecified: Secondary | ICD-10-CM | POA: Diagnosis not present

## 2018-07-15 DIAGNOSIS — R279 Unspecified lack of coordination: Secondary | ICD-10-CM | POA: Diagnosis not present

## 2018-07-18 DIAGNOSIS — R2689 Other abnormalities of gait and mobility: Secondary | ICD-10-CM | POA: Diagnosis not present

## 2018-07-18 DIAGNOSIS — R52 Pain, unspecified: Secondary | ICD-10-CM | POA: Diagnosis not present

## 2018-07-18 DIAGNOSIS — R279 Unspecified lack of coordination: Secondary | ICD-10-CM | POA: Diagnosis not present

## 2018-07-19 DIAGNOSIS — R52 Pain, unspecified: Secondary | ICD-10-CM | POA: Diagnosis not present

## 2018-07-19 DIAGNOSIS — R2689 Other abnormalities of gait and mobility: Secondary | ICD-10-CM | POA: Diagnosis not present

## 2018-07-19 DIAGNOSIS — R279 Unspecified lack of coordination: Secondary | ICD-10-CM | POA: Diagnosis not present

## 2018-07-20 DIAGNOSIS — Z8673 Personal history of transient ischemic attack (TIA), and cerebral infarction without residual deficits: Secondary | ICD-10-CM | POA: Diagnosis not present

## 2018-07-20 DIAGNOSIS — E559 Vitamin D deficiency, unspecified: Secondary | ICD-10-CM | POA: Diagnosis not present

## 2018-07-20 DIAGNOSIS — I6389 Other cerebral infarction: Secondary | ICD-10-CM | POA: Diagnosis not present

## 2018-07-20 DIAGNOSIS — Z79899 Other long term (current) drug therapy: Secondary | ICD-10-CM | POA: Diagnosis not present

## 2018-07-20 DIAGNOSIS — G4733 Obstructive sleep apnea (adult) (pediatric): Secondary | ICD-10-CM | POA: Diagnosis not present

## 2018-07-20 DIAGNOSIS — F259 Schizoaffective disorder, unspecified: Secondary | ICD-10-CM | POA: Diagnosis not present

## 2018-07-20 DIAGNOSIS — N183 Chronic kidney disease, stage 3 (moderate): Secondary | ICD-10-CM | POA: Diagnosis not present

## 2018-07-21 DIAGNOSIS — R52 Pain, unspecified: Secondary | ICD-10-CM | POA: Diagnosis not present

## 2018-07-21 DIAGNOSIS — R2689 Other abnormalities of gait and mobility: Secondary | ICD-10-CM | POA: Diagnosis not present

## 2018-07-21 DIAGNOSIS — R279 Unspecified lack of coordination: Secondary | ICD-10-CM | POA: Diagnosis not present

## 2018-07-22 DIAGNOSIS — R2689 Other abnormalities of gait and mobility: Secondary | ICD-10-CM | POA: Diagnosis not present

## 2018-07-22 DIAGNOSIS — R52 Pain, unspecified: Secondary | ICD-10-CM | POA: Diagnosis not present

## 2018-07-22 DIAGNOSIS — R279 Unspecified lack of coordination: Secondary | ICD-10-CM | POA: Diagnosis not present

## 2018-07-26 DIAGNOSIS — R52 Pain, unspecified: Secondary | ICD-10-CM | POA: Diagnosis not present

## 2018-07-26 DIAGNOSIS — R279 Unspecified lack of coordination: Secondary | ICD-10-CM | POA: Diagnosis not present

## 2018-07-26 DIAGNOSIS — R2689 Other abnormalities of gait and mobility: Secondary | ICD-10-CM | POA: Diagnosis not present

## 2018-07-29 DIAGNOSIS — R2689 Other abnormalities of gait and mobility: Secondary | ICD-10-CM | POA: Diagnosis not present

## 2018-07-29 DIAGNOSIS — R52 Pain, unspecified: Secondary | ICD-10-CM | POA: Diagnosis not present

## 2018-07-29 DIAGNOSIS — R279 Unspecified lack of coordination: Secondary | ICD-10-CM | POA: Diagnosis not present

## 2018-08-01 DIAGNOSIS — R2689 Other abnormalities of gait and mobility: Secondary | ICD-10-CM | POA: Diagnosis not present

## 2018-08-01 DIAGNOSIS — R52 Pain, unspecified: Secondary | ICD-10-CM | POA: Diagnosis not present

## 2018-08-01 DIAGNOSIS — R279 Unspecified lack of coordination: Secondary | ICD-10-CM | POA: Diagnosis not present

## 2018-08-02 DIAGNOSIS — R52 Pain, unspecified: Secondary | ICD-10-CM | POA: Diagnosis not present

## 2018-08-02 DIAGNOSIS — R279 Unspecified lack of coordination: Secondary | ICD-10-CM | POA: Diagnosis not present

## 2018-08-02 DIAGNOSIS — R2689 Other abnormalities of gait and mobility: Secondary | ICD-10-CM | POA: Diagnosis not present

## 2018-08-04 DIAGNOSIS — R279 Unspecified lack of coordination: Secondary | ICD-10-CM | POA: Diagnosis not present

## 2018-08-04 DIAGNOSIS — R2689 Other abnormalities of gait and mobility: Secondary | ICD-10-CM | POA: Diagnosis not present

## 2018-08-04 DIAGNOSIS — R52 Pain, unspecified: Secondary | ICD-10-CM | POA: Diagnosis not present

## 2018-08-05 DIAGNOSIS — R2689 Other abnormalities of gait and mobility: Secondary | ICD-10-CM | POA: Diagnosis not present

## 2018-08-05 DIAGNOSIS — R279 Unspecified lack of coordination: Secondary | ICD-10-CM | POA: Diagnosis not present

## 2018-08-05 DIAGNOSIS — R52 Pain, unspecified: Secondary | ICD-10-CM | POA: Diagnosis not present

## 2018-08-08 DIAGNOSIS — R2689 Other abnormalities of gait and mobility: Secondary | ICD-10-CM | POA: Diagnosis not present

## 2018-08-08 DIAGNOSIS — R279 Unspecified lack of coordination: Secondary | ICD-10-CM | POA: Diagnosis not present

## 2018-08-08 DIAGNOSIS — R52 Pain, unspecified: Secondary | ICD-10-CM | POA: Diagnosis not present

## 2018-08-09 DIAGNOSIS — R2689 Other abnormalities of gait and mobility: Secondary | ICD-10-CM | POA: Diagnosis not present

## 2018-08-09 DIAGNOSIS — R52 Pain, unspecified: Secondary | ICD-10-CM | POA: Diagnosis not present

## 2018-08-09 DIAGNOSIS — R279 Unspecified lack of coordination: Secondary | ICD-10-CM | POA: Diagnosis not present

## 2018-08-10 DIAGNOSIS — R2689 Other abnormalities of gait and mobility: Secondary | ICD-10-CM | POA: Diagnosis not present

## 2018-08-10 DIAGNOSIS — R52 Pain, unspecified: Secondary | ICD-10-CM | POA: Diagnosis not present

## 2018-08-10 DIAGNOSIS — R279 Unspecified lack of coordination: Secondary | ICD-10-CM | POA: Diagnosis not present

## 2018-08-11 DIAGNOSIS — R279 Unspecified lack of coordination: Secondary | ICD-10-CM | POA: Diagnosis not present

## 2018-08-11 DIAGNOSIS — R2689 Other abnormalities of gait and mobility: Secondary | ICD-10-CM | POA: Diagnosis not present

## 2018-08-11 DIAGNOSIS — R52 Pain, unspecified: Secondary | ICD-10-CM | POA: Diagnosis not present

## 2018-08-16 DIAGNOSIS — R279 Unspecified lack of coordination: Secondary | ICD-10-CM | POA: Diagnosis not present

## 2018-08-16 DIAGNOSIS — R2689 Other abnormalities of gait and mobility: Secondary | ICD-10-CM | POA: Diagnosis not present

## 2018-08-16 DIAGNOSIS — R52 Pain, unspecified: Secondary | ICD-10-CM | POA: Diagnosis not present

## 2018-08-18 DIAGNOSIS — R279 Unspecified lack of coordination: Secondary | ICD-10-CM | POA: Diagnosis not present

## 2018-08-18 DIAGNOSIS — R2689 Other abnormalities of gait and mobility: Secondary | ICD-10-CM | POA: Diagnosis not present

## 2018-08-18 DIAGNOSIS — R52 Pain, unspecified: Secondary | ICD-10-CM | POA: Diagnosis not present

## 2018-08-19 ENCOUNTER — Other Ambulatory Visit: Payer: Self-pay

## 2018-08-19 ENCOUNTER — Encounter: Payer: Self-pay | Admitting: Psychiatry

## 2018-08-19 ENCOUNTER — Ambulatory Visit (INDEPENDENT_AMBULATORY_CARE_PROVIDER_SITE_OTHER): Payer: Medicare Other | Admitting: Psychiatry

## 2018-08-19 VITALS — BP 116/59 | HR 81 | Wt 180.0 lb

## 2018-08-19 DIAGNOSIS — Z79899 Other long term (current) drug therapy: Secondary | ICD-10-CM | POA: Diagnosis not present

## 2018-08-19 DIAGNOSIS — F259 Schizoaffective disorder, unspecified: Secondary | ICD-10-CM | POA: Diagnosis not present

## 2018-08-19 DIAGNOSIS — F5101 Primary insomnia: Secondary | ICD-10-CM

## 2018-08-19 MED ORDER — FLUPHENAZINE HCL 1 MG PO TABS: 4.0000 mg | ORAL_TABLET | Freq: Every day | ORAL | 1 refills | Status: DC

## 2018-08-19 MED ORDER — LITHIUM CARBONATE 300 MG PO TABS: 300.0000 mg | ORAL_TABLET | Freq: Two times a day (BID) | ORAL | 1 refills | Status: DC

## 2018-08-19 MED ORDER — TRAZODONE HCL 50 MG PO TABS: 50.0000 mg | ORAL_TABLET | Freq: Every day | ORAL | 1 refills | Status: DC
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
## 2018-09-26 ENCOUNTER — Other Ambulatory Visit: Payer: Self-pay | Admitting: Psychiatry

## 2018-09-26 DIAGNOSIS — F259 Schizoaffective disorder, unspecified: Secondary | ICD-10-CM

## 2018-09-29 ENCOUNTER — Telehealth: Payer: Self-pay | Admitting: Psychiatry

## 2018-09-29 DIAGNOSIS — F259 Schizoaffective disorder, unspecified: Secondary | ICD-10-CM

## 2018-09-29 NOTE — Telephone Encounter (Signed)
Patient prefers the capsules of the lithium not the tablets . So please send in a new script of the capsules and a 90 day supply to the [facility] [address]

## 2018-09-30 MED ORDER — LITHIUM CARBONATE 300 MG PO CAPS: 300.0000 mg | ORAL_CAPSULE | Freq: Two times a day (BID) | ORAL | 0 refills | Status: DC

## 2018-10-05 DIAGNOSIS — M1711 Unilateral primary osteoarthritis, right knee: Secondary | ICD-10-CM | POA: Diagnosis not present

## 2018-10-05 DIAGNOSIS — D649 Anemia, unspecified: Secondary | ICD-10-CM | POA: Diagnosis not present

## 2018-10-05 DIAGNOSIS — M25561 Pain in right knee: Secondary | ICD-10-CM | POA: Diagnosis not present

## 2018-10-05 DIAGNOSIS — N183 Chronic kidney disease, stage 3 (moderate): Secondary | ICD-10-CM | POA: Diagnosis not present

## 2018-10-05 DIAGNOSIS — F259 Schizoaffective disorder, unspecified: Secondary | ICD-10-CM | POA: Diagnosis not present

## 2018-10-05 DIAGNOSIS — R2689 Other abnormalities of gait and mobility: Secondary | ICD-10-CM | POA: Diagnosis not present

## 2018-10-08 DIAGNOSIS — G4739 Other sleep apnea: Secondary | ICD-10-CM | POA: Diagnosis not present

## 2018-10-08 DIAGNOSIS — E232 Diabetes insipidus: Secondary | ICD-10-CM | POA: Diagnosis not present

## 2018-10-08 DIAGNOSIS — F259 Schizoaffective disorder, unspecified: Secondary | ICD-10-CM | POA: Diagnosis not present

## 2018-10-08 DIAGNOSIS — M4800 Spinal stenosis, site unspecified: Secondary | ICD-10-CM | POA: Diagnosis not present

## 2018-10-08 DIAGNOSIS — D631 Anemia in chronic kidney disease: Secondary | ICD-10-CM | POA: Diagnosis not present

## 2018-10-08 DIAGNOSIS — H409 Unspecified glaucoma: Secondary | ICD-10-CM | POA: Diagnosis not present

## 2018-10-08 DIAGNOSIS — M47812 Spondylosis without myelopathy or radiculopathy, cervical region: Secondary | ICD-10-CM | POA: Diagnosis not present

## 2018-10-08 DIAGNOSIS — M170 Bilateral primary osteoarthritis of knee: Secondary | ICD-10-CM | POA: Diagnosis not present

## 2018-10-08 DIAGNOSIS — E7849 Other hyperlipidemia: Secondary | ICD-10-CM | POA: Diagnosis not present

## 2018-10-08 DIAGNOSIS — Z9181 History of falling: Secondary | ICD-10-CM | POA: Diagnosis not present

## 2018-10-08 DIAGNOSIS — N183 Chronic kidney disease, stage 3 (moderate): Secondary | ICD-10-CM | POA: Diagnosis not present

## 2018-10-08 DIAGNOSIS — Z87440 Personal history of urinary (tract) infections: Secondary | ICD-10-CM | POA: Diagnosis not present

## 2018-10-08 DIAGNOSIS — Z7902 Long term (current) use of antithrombotics/antiplatelets: Secondary | ICD-10-CM | POA: Diagnosis not present

## 2018-10-08 DIAGNOSIS — I69354 Hemiplegia and hemiparesis following cerebral infarction affecting left non-dominant side: Secondary | ICD-10-CM | POA: Diagnosis not present

## 2018-10-12 DIAGNOSIS — N183 Chronic kidney disease, stage 3 (moderate): Secondary | ICD-10-CM | POA: Diagnosis not present

## 2018-10-12 DIAGNOSIS — M170 Bilateral primary osteoarthritis of knee: Secondary | ICD-10-CM | POA: Diagnosis not present

## 2018-10-12 DIAGNOSIS — D631 Anemia in chronic kidney disease: Secondary | ICD-10-CM | POA: Diagnosis not present

## 2018-10-12 DIAGNOSIS — F259 Schizoaffective disorder, unspecified: Secondary | ICD-10-CM | POA: Diagnosis not present

## 2018-10-12 DIAGNOSIS — H409 Unspecified glaucoma: Secondary | ICD-10-CM | POA: Diagnosis not present

## 2018-10-12 DIAGNOSIS — I69354 Hemiplegia and hemiparesis following cerebral infarction affecting left non-dominant side: Secondary | ICD-10-CM | POA: Diagnosis not present

## 2018-10-14 DIAGNOSIS — H409 Unspecified glaucoma: Secondary | ICD-10-CM | POA: Diagnosis not present

## 2018-10-14 DIAGNOSIS — N183 Chronic kidney disease, stage 3 (moderate): Secondary | ICD-10-CM | POA: Diagnosis not present

## 2018-10-14 DIAGNOSIS — F259 Schizoaffective disorder, unspecified: Secondary | ICD-10-CM | POA: Diagnosis not present

## 2018-10-14 DIAGNOSIS — I69354 Hemiplegia and hemiparesis following cerebral infarction affecting left non-dominant side: Secondary | ICD-10-CM | POA: Diagnosis not present

## 2018-10-14 DIAGNOSIS — M170 Bilateral primary osteoarthritis of knee: Secondary | ICD-10-CM | POA: Diagnosis not present

## 2018-10-14 DIAGNOSIS — D631 Anemia in chronic kidney disease: Secondary | ICD-10-CM | POA: Diagnosis not present

## 2018-10-17 DIAGNOSIS — M170 Bilateral primary osteoarthritis of knee: Secondary | ICD-10-CM | POA: Diagnosis not present

## 2018-10-17 DIAGNOSIS — H409 Unspecified glaucoma: Secondary | ICD-10-CM | POA: Diagnosis not present

## 2018-10-17 DIAGNOSIS — N183 Chronic kidney disease, stage 3 (moderate): Secondary | ICD-10-CM | POA: Diagnosis not present

## 2018-10-17 DIAGNOSIS — F259 Schizoaffective disorder, unspecified: Secondary | ICD-10-CM | POA: Diagnosis not present

## 2018-10-17 DIAGNOSIS — I69354 Hemiplegia and hemiparesis following cerebral infarction affecting left non-dominant side: Secondary | ICD-10-CM | POA: Diagnosis not present

## 2018-10-17 DIAGNOSIS — D631 Anemia in chronic kidney disease: Secondary | ICD-10-CM | POA: Diagnosis not present

## 2018-10-18 DIAGNOSIS — M170 Bilateral primary osteoarthritis of knee: Secondary | ICD-10-CM | POA: Diagnosis not present

## 2018-10-18 DIAGNOSIS — H409 Unspecified glaucoma: Secondary | ICD-10-CM | POA: Diagnosis not present

## 2018-10-18 DIAGNOSIS — F259 Schizoaffective disorder, unspecified: Secondary | ICD-10-CM | POA: Diagnosis not present

## 2018-10-18 DIAGNOSIS — D631 Anemia in chronic kidney disease: Secondary | ICD-10-CM | POA: Diagnosis not present

## 2018-10-18 DIAGNOSIS — I69354 Hemiplegia and hemiparesis following cerebral infarction affecting left non-dominant side: Secondary | ICD-10-CM | POA: Diagnosis not present

## 2018-10-18 DIAGNOSIS — N183 Chronic kidney disease, stage 3 (moderate): Secondary | ICD-10-CM | POA: Diagnosis not present

## 2018-10-21 DIAGNOSIS — M170 Bilateral primary osteoarthritis of knee: Secondary | ICD-10-CM | POA: Diagnosis not present

## 2018-10-21 DIAGNOSIS — N183 Chronic kidney disease, stage 3 (moderate): Secondary | ICD-10-CM | POA: Diagnosis not present

## 2018-10-21 DIAGNOSIS — F259 Schizoaffective disorder, unspecified: Secondary | ICD-10-CM | POA: Diagnosis not present

## 2018-10-21 DIAGNOSIS — D631 Anemia in chronic kidney disease: Secondary | ICD-10-CM | POA: Diagnosis not present

## 2018-10-21 DIAGNOSIS — I69354 Hemiplegia and hemiparesis following cerebral infarction affecting left non-dominant side: Secondary | ICD-10-CM | POA: Diagnosis not present

## 2018-10-21 DIAGNOSIS — H409 Unspecified glaucoma: Secondary | ICD-10-CM | POA: Diagnosis not present

## 2018-10-24 DIAGNOSIS — N183 Chronic kidney disease, stage 3 (moderate): Secondary | ICD-10-CM | POA: Diagnosis not present

## 2018-10-24 DIAGNOSIS — M170 Bilateral primary osteoarthritis of knee: Secondary | ICD-10-CM | POA: Diagnosis not present

## 2018-10-24 DIAGNOSIS — D631 Anemia in chronic kidney disease: Secondary | ICD-10-CM | POA: Diagnosis not present

## 2018-10-24 DIAGNOSIS — H409 Unspecified glaucoma: Secondary | ICD-10-CM | POA: Diagnosis not present

## 2018-10-24 DIAGNOSIS — F259 Schizoaffective disorder, unspecified: Secondary | ICD-10-CM | POA: Diagnosis not present

## 2018-10-24 DIAGNOSIS — I69354 Hemiplegia and hemiparesis following cerebral infarction affecting left non-dominant side: Secondary | ICD-10-CM | POA: Diagnosis not present

## 2018-10-26 DIAGNOSIS — M1712 Unilateral primary osteoarthritis, left knee: Secondary | ICD-10-CM | POA: Diagnosis not present

## 2018-10-26 DIAGNOSIS — M170 Bilateral primary osteoarthritis of knee: Secondary | ICD-10-CM | POA: Diagnosis not present

## 2018-10-26 DIAGNOSIS — M25551 Pain in right hip: Secondary | ICD-10-CM | POA: Diagnosis not present

## 2018-10-26 DIAGNOSIS — M1711 Unilateral primary osteoarthritis, right knee: Secondary | ICD-10-CM | POA: Diagnosis not present

## 2018-10-26 DIAGNOSIS — M25561 Pain in right knee: Secondary | ICD-10-CM | POA: Diagnosis not present

## 2018-10-28 DIAGNOSIS — M1711 Unilateral primary osteoarthritis, right knee: Secondary | ICD-10-CM | POA: Diagnosis not present

## 2018-10-28 DIAGNOSIS — M25561 Pain in right knee: Secondary | ICD-10-CM | POA: Diagnosis not present

## 2018-10-31 DIAGNOSIS — N183 Chronic kidney disease, stage 3 (moderate): Secondary | ICD-10-CM | POA: Diagnosis not present

## 2018-10-31 DIAGNOSIS — H409 Unspecified glaucoma: Secondary | ICD-10-CM | POA: Diagnosis not present

## 2018-10-31 DIAGNOSIS — M170 Bilateral primary osteoarthritis of knee: Secondary | ICD-10-CM | POA: Diagnosis not present

## 2018-10-31 DIAGNOSIS — D631 Anemia in chronic kidney disease: Secondary | ICD-10-CM | POA: Diagnosis not present

## 2018-10-31 DIAGNOSIS — I69354 Hemiplegia and hemiparesis following cerebral infarction affecting left non-dominant side: Secondary | ICD-10-CM | POA: Diagnosis not present

## 2018-10-31 DIAGNOSIS — F259 Schizoaffective disorder, unspecified: Secondary | ICD-10-CM | POA: Diagnosis not present

## 2018-11-01 DIAGNOSIS — D631 Anemia in chronic kidney disease: Secondary | ICD-10-CM | POA: Diagnosis not present

## 2018-11-01 DIAGNOSIS — H409 Unspecified glaucoma: Secondary | ICD-10-CM | POA: Diagnosis not present

## 2018-11-01 DIAGNOSIS — N183 Chronic kidney disease, stage 3 (moderate): Secondary | ICD-10-CM | POA: Diagnosis not present

## 2018-11-01 DIAGNOSIS — F259 Schizoaffective disorder, unspecified: Secondary | ICD-10-CM | POA: Diagnosis not present

## 2018-11-01 DIAGNOSIS — I69354 Hemiplegia and hemiparesis following cerebral infarction affecting left non-dominant side: Secondary | ICD-10-CM | POA: Diagnosis not present

## 2018-11-01 DIAGNOSIS — M170 Bilateral primary osteoarthritis of knee: Secondary | ICD-10-CM | POA: Diagnosis not present

## 2018-11-02 DIAGNOSIS — D649 Anemia, unspecified: Secondary | ICD-10-CM | POA: Diagnosis not present

## 2018-11-02 DIAGNOSIS — D539 Nutritional anemia, unspecified: Secondary | ICD-10-CM | POA: Diagnosis not present

## 2018-11-03 DIAGNOSIS — M1711 Unilateral primary osteoarthritis, right knee: Secondary | ICD-10-CM | POA: Diagnosis not present

## 2018-11-03 DIAGNOSIS — M25551 Pain in right hip: Secondary | ICD-10-CM | POA: Diagnosis not present

## 2018-11-03 DIAGNOSIS — M25561 Pain in right knee: Secondary | ICD-10-CM | POA: Diagnosis not present

## 2018-11-07 DIAGNOSIS — N183 Chronic kidney disease, stage 3 unspecified: Secondary | ICD-10-CM | POA: Diagnosis not present

## 2018-11-07 DIAGNOSIS — I69354 Hemiplegia and hemiparesis following cerebral infarction affecting left non-dominant side: Secondary | ICD-10-CM | POA: Diagnosis not present

## 2018-11-07 DIAGNOSIS — G4739 Other sleep apnea: Secondary | ICD-10-CM | POA: Diagnosis not present

## 2018-11-07 DIAGNOSIS — Z87440 Personal history of urinary (tract) infections: Secondary | ICD-10-CM | POA: Diagnosis not present

## 2018-11-07 DIAGNOSIS — H409 Unspecified glaucoma: Secondary | ICD-10-CM | POA: Diagnosis not present

## 2018-11-07 DIAGNOSIS — E7849 Other hyperlipidemia: Secondary | ICD-10-CM | POA: Diagnosis not present

## 2018-11-07 DIAGNOSIS — M4800 Spinal stenosis, site unspecified: Secondary | ICD-10-CM | POA: Diagnosis not present

## 2018-11-07 DIAGNOSIS — Z9181 History of falling: Secondary | ICD-10-CM | POA: Diagnosis not present

## 2018-11-07 DIAGNOSIS — D631 Anemia in chronic kidney disease: Secondary | ICD-10-CM | POA: Diagnosis not present

## 2018-11-07 DIAGNOSIS — E232 Diabetes insipidus: Secondary | ICD-10-CM | POA: Diagnosis not present

## 2018-11-07 DIAGNOSIS — Z7902 Long term (current) use of antithrombotics/antiplatelets: Secondary | ICD-10-CM | POA: Diagnosis not present

## 2018-11-07 DIAGNOSIS — F259 Schizoaffective disorder, unspecified: Secondary | ICD-10-CM | POA: Diagnosis not present

## 2018-11-07 DIAGNOSIS — M47812 Spondylosis without myelopathy or radiculopathy, cervical region: Secondary | ICD-10-CM | POA: Diagnosis not present

## 2018-11-07 DIAGNOSIS — M170 Bilateral primary osteoarthritis of knee: Secondary | ICD-10-CM | POA: Diagnosis not present

## 2018-11-08 DIAGNOSIS — N183 Chronic kidney disease, stage 3 unspecified: Secondary | ICD-10-CM | POA: Diagnosis not present

## 2018-11-08 DIAGNOSIS — H409 Unspecified glaucoma: Secondary | ICD-10-CM | POA: Diagnosis not present

## 2018-11-08 DIAGNOSIS — D631 Anemia in chronic kidney disease: Secondary | ICD-10-CM | POA: Diagnosis not present

## 2018-11-08 DIAGNOSIS — I69354 Hemiplegia and hemiparesis following cerebral infarction affecting left non-dominant side: Secondary | ICD-10-CM | POA: Diagnosis not present

## 2018-11-08 DIAGNOSIS — M170 Bilateral primary osteoarthritis of knee: Secondary | ICD-10-CM | POA: Diagnosis not present

## 2018-11-08 DIAGNOSIS — F259 Schizoaffective disorder, unspecified: Secondary | ICD-10-CM | POA: Diagnosis not present

## 2018-11-11 DIAGNOSIS — M1611 Unilateral primary osteoarthritis, right hip: Secondary | ICD-10-CM | POA: Diagnosis not present

## 2018-11-11 DIAGNOSIS — M25561 Pain in right knee: Secondary | ICD-10-CM | POA: Diagnosis not present

## 2018-11-11 DIAGNOSIS — M1711 Unilateral primary osteoarthritis, right knee: Secondary | ICD-10-CM | POA: Diagnosis not present

## 2018-11-11 DIAGNOSIS — M25551 Pain in right hip: Secondary | ICD-10-CM | POA: Diagnosis not present

## 2018-11-14 DIAGNOSIS — M170 Bilateral primary osteoarthritis of knee: Secondary | ICD-10-CM | POA: Diagnosis not present

## 2018-11-14 DIAGNOSIS — D631 Anemia in chronic kidney disease: Secondary | ICD-10-CM | POA: Diagnosis not present

## 2018-11-14 DIAGNOSIS — F259 Schizoaffective disorder, unspecified: Secondary | ICD-10-CM | POA: Diagnosis not present

## 2018-11-14 DIAGNOSIS — H409 Unspecified glaucoma: Secondary | ICD-10-CM | POA: Diagnosis not present

## 2018-11-14 DIAGNOSIS — I69354 Hemiplegia and hemiparesis following cerebral infarction affecting left non-dominant side: Secondary | ICD-10-CM | POA: Diagnosis not present

## 2018-11-14 DIAGNOSIS — N183 Chronic kidney disease, stage 3 unspecified: Secondary | ICD-10-CM | POA: Diagnosis not present

## 2018-11-15 DIAGNOSIS — I69354 Hemiplegia and hemiparesis following cerebral infarction affecting left non-dominant side: Secondary | ICD-10-CM | POA: Diagnosis not present

## 2018-11-15 DIAGNOSIS — D631 Anemia in chronic kidney disease: Secondary | ICD-10-CM | POA: Diagnosis not present

## 2018-11-15 DIAGNOSIS — N183 Chronic kidney disease, stage 3 unspecified: Secondary | ICD-10-CM | POA: Diagnosis not present

## 2018-11-15 DIAGNOSIS — H409 Unspecified glaucoma: Secondary | ICD-10-CM | POA: Diagnosis not present

## 2018-11-15 DIAGNOSIS — F259 Schizoaffective disorder, unspecified: Secondary | ICD-10-CM | POA: Diagnosis not present

## 2018-11-15 DIAGNOSIS — M170 Bilateral primary osteoarthritis of knee: Secondary | ICD-10-CM | POA: Diagnosis not present

## 2018-11-18 ENCOUNTER — Encounter: Payer: Self-pay | Admitting: Psychiatry

## 2018-11-18 ENCOUNTER — Ambulatory Visit (INDEPENDENT_AMBULATORY_CARE_PROVIDER_SITE_OTHER): Payer: Medicare Other | Admitting: Psychiatry

## 2018-11-18 ENCOUNTER — Other Ambulatory Visit: Payer: Self-pay

## 2018-11-18 VITALS — BP 117/67 | HR 57

## 2018-11-18 DIAGNOSIS — F259 Schizoaffective disorder, unspecified: Secondary | ICD-10-CM

## 2018-11-18 DIAGNOSIS — Z79899 Other long term (current) drug therapy: Secondary | ICD-10-CM | POA: Diagnosis not present

## 2018-11-18 DIAGNOSIS — F5101 Primary insomnia: Secondary | ICD-10-CM

## 2018-11-18 MED ORDER — FLUPHENAZINE HCL 1 MG PO TABS: 4.0000 mg | ORAL_TABLET | Freq: Every day | ORAL | 1 refills | Status: DC

## 2018-11-18 MED ORDER — LITHIUM CARBONATE 300 MG PO CAPS: 300.0000 mg | ORAL_CAPSULE | Freq: Two times a day (BID) | ORAL | 0 refills | Status: DC

## 2018-11-18 MED ORDER — TRAZODONE HCL 50 MG PO TABS: 50.0000 mg | ORAL_TABLET | Freq: Every day | ORAL | 1 refills | Status: DC
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
## 2018-11-22 DIAGNOSIS — N183 Chronic kidney disease, stage 3 unspecified: Secondary | ICD-10-CM | POA: Diagnosis not present

## 2018-11-22 DIAGNOSIS — F259 Schizoaffective disorder, unspecified: Secondary | ICD-10-CM | POA: Diagnosis not present

## 2018-11-22 DIAGNOSIS — M170 Bilateral primary osteoarthritis of knee: Secondary | ICD-10-CM | POA: Diagnosis not present

## 2018-11-22 DIAGNOSIS — D631 Anemia in chronic kidney disease: Secondary | ICD-10-CM | POA: Diagnosis not present

## 2018-11-22 DIAGNOSIS — I69354 Hemiplegia and hemiparesis following cerebral infarction affecting left non-dominant side: Secondary | ICD-10-CM | POA: Diagnosis not present

## 2018-11-22 DIAGNOSIS — H409 Unspecified glaucoma: Secondary | ICD-10-CM | POA: Diagnosis not present

## 2018-11-23 DIAGNOSIS — H409 Unspecified glaucoma: Secondary | ICD-10-CM | POA: Diagnosis not present

## 2018-11-23 DIAGNOSIS — M170 Bilateral primary osteoarthritis of knee: Secondary | ICD-10-CM | POA: Diagnosis not present

## 2018-11-23 DIAGNOSIS — F259 Schizoaffective disorder, unspecified: Secondary | ICD-10-CM | POA: Diagnosis not present

## 2018-11-23 DIAGNOSIS — D631 Anemia in chronic kidney disease: Secondary | ICD-10-CM | POA: Diagnosis not present

## 2018-11-23 DIAGNOSIS — N183 Chronic kidney disease, stage 3 unspecified: Secondary | ICD-10-CM | POA: Diagnosis not present

## 2018-11-23 DIAGNOSIS — I69354 Hemiplegia and hemiparesis following cerebral infarction affecting left non-dominant side: Secondary | ICD-10-CM | POA: Diagnosis not present

## 2018-12-27 ENCOUNTER — Other Ambulatory Visit: Payer: Self-pay | Admitting: Psychiatry

## 2018-12-27 DIAGNOSIS — F259 Schizoaffective disorder, unspecified: Secondary | ICD-10-CM

## 2019-02-08 DIAGNOSIS — N183 Chronic kidney disease, stage 3 unspecified: Secondary | ICD-10-CM | POA: Diagnosis not present

## 2019-02-08 DIAGNOSIS — R2689 Other abnormalities of gait and mobility: Secondary | ICD-10-CM | POA: Diagnosis not present

## 2019-02-08 DIAGNOSIS — Z8673 Personal history of transient ischemic attack (TIA), and cerebral infarction without residual deficits: Secondary | ICD-10-CM | POA: Diagnosis not present

## 2019-02-08 DIAGNOSIS — M1711 Unilateral primary osteoarthritis, right knee: Secondary | ICD-10-CM | POA: Diagnosis not present

## 2019-02-14 DIAGNOSIS — M542 Cervicalgia: Secondary | ICD-10-CM | POA: Diagnosis not present

## 2019-02-14 DIAGNOSIS — Z9181 History of falling: Secondary | ICD-10-CM | POA: Diagnosis not present

## 2019-02-14 DIAGNOSIS — Z7902 Long term (current) use of antithrombotics/antiplatelets: Secondary | ICD-10-CM | POA: Diagnosis not present

## 2019-02-14 DIAGNOSIS — G4739 Other sleep apnea: Secondary | ICD-10-CM | POA: Diagnosis not present

## 2019-02-14 DIAGNOSIS — H409 Unspecified glaucoma: Secondary | ICD-10-CM | POA: Diagnosis not present

## 2019-02-14 DIAGNOSIS — F259 Schizoaffective disorder, unspecified: Secondary | ICD-10-CM | POA: Diagnosis not present

## 2019-02-14 DIAGNOSIS — M170 Bilateral primary osteoarthritis of knee: Secondary | ICD-10-CM | POA: Diagnosis not present

## 2019-02-14 DIAGNOSIS — I129 Hypertensive chronic kidney disease with stage 1 through stage 4 chronic kidney disease, or unspecified chronic kidney disease: Secondary | ICD-10-CM | POA: Diagnosis not present

## 2019-02-14 DIAGNOSIS — Z87440 Personal history of urinary (tract) infections: Secondary | ICD-10-CM | POA: Diagnosis not present

## 2019-02-14 DIAGNOSIS — E7849 Other hyperlipidemia: Secondary | ICD-10-CM | POA: Diagnosis not present

## 2019-02-14 DIAGNOSIS — D631 Anemia in chronic kidney disease: Secondary | ICD-10-CM | POA: Diagnosis not present

## 2019-02-14 DIAGNOSIS — I69354 Hemiplegia and hemiparesis following cerebral infarction affecting left non-dominant side: Secondary | ICD-10-CM | POA: Diagnosis not present

## 2019-02-14 DIAGNOSIS — M479 Spondylosis, unspecified: Secondary | ICD-10-CM | POA: Diagnosis not present

## 2019-02-14 DIAGNOSIS — M25519 Pain in unspecified shoulder: Secondary | ICD-10-CM | POA: Diagnosis not present

## 2019-02-14 DIAGNOSIS — N183 Chronic kidney disease, stage 3 unspecified: Secondary | ICD-10-CM | POA: Diagnosis not present

## 2019-02-14 DIAGNOSIS — Z9182 Personal history of military deployment: Secondary | ICD-10-CM | POA: Diagnosis not present

## 2019-02-21 DIAGNOSIS — I129 Hypertensive chronic kidney disease with stage 1 through stage 4 chronic kidney disease, or unspecified chronic kidney disease: Secondary | ICD-10-CM | POA: Diagnosis not present

## 2019-02-21 DIAGNOSIS — I69354 Hemiplegia and hemiparesis following cerebral infarction affecting left non-dominant side: Secondary | ICD-10-CM | POA: Diagnosis not present

## 2019-02-21 DIAGNOSIS — M25519 Pain in unspecified shoulder: Secondary | ICD-10-CM | POA: Diagnosis not present

## 2019-02-21 DIAGNOSIS — M170 Bilateral primary osteoarthritis of knee: Secondary | ICD-10-CM | POA: Diagnosis not present

## 2019-02-21 DIAGNOSIS — M542 Cervicalgia: Secondary | ICD-10-CM | POA: Diagnosis not present

## 2019-02-21 DIAGNOSIS — N183 Chronic kidney disease, stage 3 unspecified: Secondary | ICD-10-CM | POA: Diagnosis not present

## 2019-02-28 DIAGNOSIS — M170 Bilateral primary osteoarthritis of knee: Secondary | ICD-10-CM | POA: Diagnosis not present

## 2019-02-28 DIAGNOSIS — M25519 Pain in unspecified shoulder: Secondary | ICD-10-CM | POA: Diagnosis not present

## 2019-02-28 DIAGNOSIS — I129 Hypertensive chronic kidney disease with stage 1 through stage 4 chronic kidney disease, or unspecified chronic kidney disease: Secondary | ICD-10-CM | POA: Diagnosis not present

## 2019-02-28 DIAGNOSIS — M542 Cervicalgia: Secondary | ICD-10-CM | POA: Diagnosis not present

## 2019-02-28 DIAGNOSIS — I69354 Hemiplegia and hemiparesis following cerebral infarction affecting left non-dominant side: Secondary | ICD-10-CM | POA: Diagnosis not present

## 2019-02-28 DIAGNOSIS — N183 Chronic kidney disease, stage 3 unspecified: Secondary | ICD-10-CM | POA: Diagnosis not present

## 2019-03-02 DIAGNOSIS — M170 Bilateral primary osteoarthritis of knee: Secondary | ICD-10-CM | POA: Diagnosis not present

## 2019-03-02 DIAGNOSIS — M25519 Pain in unspecified shoulder: Secondary | ICD-10-CM | POA: Diagnosis not present

## 2019-03-02 DIAGNOSIS — I69354 Hemiplegia and hemiparesis following cerebral infarction affecting left non-dominant side: Secondary | ICD-10-CM | POA: Diagnosis not present

## 2019-03-02 DIAGNOSIS — I129 Hypertensive chronic kidney disease with stage 1 through stage 4 chronic kidney disease, or unspecified chronic kidney disease: Secondary | ICD-10-CM | POA: Diagnosis not present

## 2019-03-02 DIAGNOSIS — N183 Chronic kidney disease, stage 3 unspecified: Secondary | ICD-10-CM | POA: Diagnosis not present

## 2019-03-02 DIAGNOSIS — M542 Cervicalgia: Secondary | ICD-10-CM | POA: Diagnosis not present

## 2019-03-07 DIAGNOSIS — N183 Chronic kidney disease, stage 3 unspecified: Secondary | ICD-10-CM | POA: Diagnosis not present

## 2019-03-07 DIAGNOSIS — I129 Hypertensive chronic kidney disease with stage 1 through stage 4 chronic kidney disease, or unspecified chronic kidney disease: Secondary | ICD-10-CM | POA: Diagnosis not present

## 2019-03-07 DIAGNOSIS — I69354 Hemiplegia and hemiparesis following cerebral infarction affecting left non-dominant side: Secondary | ICD-10-CM | POA: Diagnosis not present

## 2019-03-07 DIAGNOSIS — M170 Bilateral primary osteoarthritis of knee: Secondary | ICD-10-CM | POA: Diagnosis not present

## 2019-03-07 DIAGNOSIS — M25519 Pain in unspecified shoulder: Secondary | ICD-10-CM | POA: Diagnosis not present

## 2019-03-07 DIAGNOSIS — M542 Cervicalgia: Secondary | ICD-10-CM | POA: Diagnosis not present

## 2019-03-09 DIAGNOSIS — I69354 Hemiplegia and hemiparesis following cerebral infarction affecting left non-dominant side: Secondary | ICD-10-CM | POA: Diagnosis not present

## 2019-03-09 DIAGNOSIS — M25519 Pain in unspecified shoulder: Secondary | ICD-10-CM | POA: Diagnosis not present

## 2019-03-09 DIAGNOSIS — M542 Cervicalgia: Secondary | ICD-10-CM | POA: Diagnosis not present

## 2019-03-09 DIAGNOSIS — M170 Bilateral primary osteoarthritis of knee: Secondary | ICD-10-CM | POA: Diagnosis not present

## 2019-03-09 DIAGNOSIS — N183 Chronic kidney disease, stage 3 unspecified: Secondary | ICD-10-CM | POA: Diagnosis not present

## 2019-03-09 DIAGNOSIS — I129 Hypertensive chronic kidney disease with stage 1 through stage 4 chronic kidney disease, or unspecified chronic kidney disease: Secondary | ICD-10-CM | POA: Diagnosis not present

## 2019-03-12 ENCOUNTER — Ambulatory Visit: Payer: Medicare Other

## 2019-03-14 DIAGNOSIS — M170 Bilateral primary osteoarthritis of knee: Secondary | ICD-10-CM | POA: Diagnosis not present

## 2019-03-14 DIAGNOSIS — I69354 Hemiplegia and hemiparesis following cerebral infarction affecting left non-dominant side: Secondary | ICD-10-CM | POA: Diagnosis not present

## 2019-03-14 DIAGNOSIS — M25519 Pain in unspecified shoulder: Secondary | ICD-10-CM | POA: Diagnosis not present

## 2019-03-14 DIAGNOSIS — N183 Chronic kidney disease, stage 3 unspecified: Secondary | ICD-10-CM | POA: Diagnosis not present

## 2019-03-14 DIAGNOSIS — M542 Cervicalgia: Secondary | ICD-10-CM | POA: Diagnosis not present

## 2019-03-14 DIAGNOSIS — I129 Hypertensive chronic kidney disease with stage 1 through stage 4 chronic kidney disease, or unspecified chronic kidney disease: Secondary | ICD-10-CM | POA: Diagnosis not present

## 2019-03-16 DIAGNOSIS — F259 Schizoaffective disorder, unspecified: Secondary | ICD-10-CM | POA: Diagnosis not present

## 2019-03-16 DIAGNOSIS — I129 Hypertensive chronic kidney disease with stage 1 through stage 4 chronic kidney disease, or unspecified chronic kidney disease: Secondary | ICD-10-CM | POA: Diagnosis not present

## 2019-03-16 DIAGNOSIS — M479 Spondylosis, unspecified: Secondary | ICD-10-CM | POA: Diagnosis not present

## 2019-03-16 DIAGNOSIS — Z87440 Personal history of urinary (tract) infections: Secondary | ICD-10-CM | POA: Diagnosis not present

## 2019-03-16 DIAGNOSIS — M542 Cervicalgia: Secondary | ICD-10-CM | POA: Diagnosis not present

## 2019-03-16 DIAGNOSIS — N183 Chronic kidney disease, stage 3 unspecified: Secondary | ICD-10-CM | POA: Diagnosis not present

## 2019-03-16 DIAGNOSIS — Z7902 Long term (current) use of antithrombotics/antiplatelets: Secondary | ICD-10-CM | POA: Diagnosis not present

## 2019-03-16 DIAGNOSIS — I69354 Hemiplegia and hemiparesis following cerebral infarction affecting left non-dominant side: Secondary | ICD-10-CM | POA: Diagnosis not present

## 2019-03-16 DIAGNOSIS — Z9182 Personal history of military deployment: Secondary | ICD-10-CM | POA: Diagnosis not present

## 2019-03-16 DIAGNOSIS — Z9181 History of falling: Secondary | ICD-10-CM | POA: Diagnosis not present

## 2019-03-16 DIAGNOSIS — G4739 Other sleep apnea: Secondary | ICD-10-CM | POA: Diagnosis not present

## 2019-03-16 DIAGNOSIS — E7849 Other hyperlipidemia: Secondary | ICD-10-CM | POA: Diagnosis not present

## 2019-03-16 DIAGNOSIS — D631 Anemia in chronic kidney disease: Secondary | ICD-10-CM | POA: Diagnosis not present

## 2019-03-16 DIAGNOSIS — M170 Bilateral primary osteoarthritis of knee: Secondary | ICD-10-CM | POA: Diagnosis not present

## 2019-03-16 DIAGNOSIS — H409 Unspecified glaucoma: Secondary | ICD-10-CM | POA: Diagnosis not present

## 2019-03-16 DIAGNOSIS — M25519 Pain in unspecified shoulder: Secondary | ICD-10-CM | POA: Diagnosis not present

## 2019-03-21 DIAGNOSIS — M170 Bilateral primary osteoarthritis of knee: Secondary | ICD-10-CM | POA: Diagnosis not present

## 2019-03-21 DIAGNOSIS — M542 Cervicalgia: Secondary | ICD-10-CM | POA: Diagnosis not present

## 2019-03-21 DIAGNOSIS — I69354 Hemiplegia and hemiparesis following cerebral infarction affecting left non-dominant side: Secondary | ICD-10-CM | POA: Diagnosis not present

## 2019-03-21 DIAGNOSIS — M25519 Pain in unspecified shoulder: Secondary | ICD-10-CM | POA: Diagnosis not present

## 2019-03-21 DIAGNOSIS — I129 Hypertensive chronic kidney disease with stage 1 through stage 4 chronic kidney disease, or unspecified chronic kidney disease: Secondary | ICD-10-CM | POA: Diagnosis not present

## 2019-03-21 DIAGNOSIS — N183 Chronic kidney disease, stage 3 unspecified: Secondary | ICD-10-CM | POA: Diagnosis not present

## 2019-03-23 DIAGNOSIS — N183 Chronic kidney disease, stage 3 unspecified: Secondary | ICD-10-CM | POA: Diagnosis not present

## 2019-03-23 DIAGNOSIS — I69354 Hemiplegia and hemiparesis following cerebral infarction affecting left non-dominant side: Secondary | ICD-10-CM | POA: Diagnosis not present

## 2019-03-23 DIAGNOSIS — M25519 Pain in unspecified shoulder: Secondary | ICD-10-CM | POA: Diagnosis not present

## 2019-03-23 DIAGNOSIS — M170 Bilateral primary osteoarthritis of knee: Secondary | ICD-10-CM | POA: Diagnosis not present

## 2019-03-23 DIAGNOSIS — I129 Hypertensive chronic kidney disease with stage 1 through stage 4 chronic kidney disease, or unspecified chronic kidney disease: Secondary | ICD-10-CM | POA: Diagnosis not present

## 2019-03-23 DIAGNOSIS — M542 Cervicalgia: Secondary | ICD-10-CM | POA: Diagnosis not present

## 2019-03-27 ENCOUNTER — Other Ambulatory Visit: Payer: Self-pay

## 2019-03-27 DIAGNOSIS — F259 Schizoaffective disorder, unspecified: Secondary | ICD-10-CM

## 2019-03-27 MED ORDER — LITHIUM CARBONATE 300 MG PO CAPS: ORAL_CAPSULE | ORAL | 0 refills | Status: DC

## 2019-03-28 DIAGNOSIS — M542 Cervicalgia: Secondary | ICD-10-CM | POA: Diagnosis not present

## 2019-03-28 DIAGNOSIS — I129 Hypertensive chronic kidney disease with stage 1 through stage 4 chronic kidney disease, or unspecified chronic kidney disease: Secondary | ICD-10-CM | POA: Diagnosis not present

## 2019-03-28 DIAGNOSIS — I69354 Hemiplegia and hemiparesis following cerebral infarction affecting left non-dominant side: Secondary | ICD-10-CM | POA: Diagnosis not present

## 2019-03-28 DIAGNOSIS — M25519 Pain in unspecified shoulder: Secondary | ICD-10-CM | POA: Diagnosis not present

## 2019-03-28 DIAGNOSIS — N183 Chronic kidney disease, stage 3 unspecified: Secondary | ICD-10-CM | POA: Diagnosis not present

## 2019-03-28 DIAGNOSIS — M170 Bilateral primary osteoarthritis of knee: Secondary | ICD-10-CM | POA: Diagnosis not present

## 2019-03-31 ENCOUNTER — Ambulatory Visit: Payer: Medicare Other

## 2019-04-04 DIAGNOSIS — M542 Cervicalgia: Secondary | ICD-10-CM | POA: Diagnosis not present

## 2019-04-04 DIAGNOSIS — M170 Bilateral primary osteoarthritis of knee: Secondary | ICD-10-CM | POA: Diagnosis not present

## 2019-04-04 DIAGNOSIS — M25519 Pain in unspecified shoulder: Secondary | ICD-10-CM | POA: Diagnosis not present

## 2019-04-04 DIAGNOSIS — I69354 Hemiplegia and hemiparesis following cerebral infarction affecting left non-dominant side: Secondary | ICD-10-CM | POA: Diagnosis not present

## 2019-04-04 DIAGNOSIS — I129 Hypertensive chronic kidney disease with stage 1 through stage 4 chronic kidney disease, or unspecified chronic kidney disease: Secondary | ICD-10-CM | POA: Diagnosis not present

## 2019-04-04 DIAGNOSIS — N183 Chronic kidney disease, stage 3 unspecified: Secondary | ICD-10-CM | POA: Diagnosis not present

## 2019-04-16 ENCOUNTER — Other Ambulatory Visit: Payer: Self-pay

## 2019-04-16 ENCOUNTER — Inpatient Hospital Stay (HOSPITAL_COMMUNITY)
Admission: EM | Admit: 2019-04-16 | Discharge: 2019-04-23 | DRG: 092 | Disposition: A | Discharge status: Home / Self Care | Payer: Medicare Other | Attending: Family Medicine | Admitting: Family Medicine

## 2019-04-16 ENCOUNTER — Emergency Department (HOSPITAL_COMMUNITY): Payer: Medicare Other

## 2019-04-16 ENCOUNTER — Encounter (HOSPITAL_COMMUNITY): Payer: Self-pay

## 2019-04-16 DIAGNOSIS — R7989 Other specified abnormal findings of blood chemistry: Secondary | ICD-10-CM | POA: Diagnosis present

## 2019-04-16 DIAGNOSIS — N390 Urinary tract infection, site not specified: Secondary | ICD-10-CM | POA: Diagnosis not present

## 2019-04-16 DIAGNOSIS — T43595A Adverse effect of other antipsychotics and neuroleptics, initial encounter: Secondary | ICD-10-CM | POA: Diagnosis present

## 2019-04-16 DIAGNOSIS — Z20822 Contact with and (suspected) exposure to covid-19: Secondary | ICD-10-CM | POA: Diagnosis not present

## 2019-04-16 DIAGNOSIS — M1711 Unilateral primary osteoarthritis, right knee: Secondary | ICD-10-CM | POA: Diagnosis present

## 2019-04-16 DIAGNOSIS — E870 Hyperosmolality and hypernatremia: Secondary | ICD-10-CM | POA: Diagnosis not present

## 2019-04-16 DIAGNOSIS — T56891A Toxic effect of other metals, accidental (unintentional), initial encounter: Secondary | ICD-10-CM | POA: Diagnosis not present

## 2019-04-16 DIAGNOSIS — R251 Tremor, unspecified: Secondary | ICD-10-CM | POA: Diagnosis present

## 2019-04-16 DIAGNOSIS — N1832 Chronic kidney disease, stage 3b: Secondary | ICD-10-CM | POA: Diagnosis present

## 2019-04-16 DIAGNOSIS — Z79899 Other long term (current) drug therapy: Secondary | ICD-10-CM

## 2019-04-16 DIAGNOSIS — G9340 Encephalopathy, unspecified: Secondary | ICD-10-CM | POA: Diagnosis not present

## 2019-04-16 DIAGNOSIS — E232 Diabetes insipidus: Secondary | ICD-10-CM

## 2019-04-16 DIAGNOSIS — Z7902 Long term (current) use of antithrombotics/antiplatelets: Secondary | ICD-10-CM

## 2019-04-16 DIAGNOSIS — Z818 Family history of other mental and behavioral disorders: Secondary | ICD-10-CM

## 2019-04-16 DIAGNOSIS — F259 Schizoaffective disorder, unspecified: Secondary | ICD-10-CM | POA: Diagnosis present

## 2019-04-16 DIAGNOSIS — G92 Toxic encephalopathy: Principal | ICD-10-CM | POA: Diagnosis present

## 2019-04-16 DIAGNOSIS — G4731 Primary central sleep apnea: Secondary | ICD-10-CM | POA: Diagnosis present

## 2019-04-16 DIAGNOSIS — F419 Anxiety disorder, unspecified: Secondary | ICD-10-CM | POA: Diagnosis present

## 2019-04-16 DIAGNOSIS — N189 Chronic kidney disease, unspecified: Secondary | ICD-10-CM | POA: Diagnosis not present

## 2019-04-16 DIAGNOSIS — E785 Hyperlipidemia, unspecified: Secondary | ICD-10-CM | POA: Diagnosis present

## 2019-04-16 DIAGNOSIS — Z8673 Personal history of transient ischemic attack (TIA), and cerebral infarction without residual deficits: Secondary | ICD-10-CM

## 2019-04-16 DIAGNOSIS — I129 Hypertensive chronic kidney disease with stage 1 through stage 4 chronic kidney disease, or unspecified chronic kidney disease: Secondary | ICD-10-CM | POA: Diagnosis present

## 2019-04-16 DIAGNOSIS — Z833 Family history of diabetes mellitus: Secondary | ICD-10-CM

## 2019-04-16 DIAGNOSIS — N251 Nephrogenic diabetes insipidus: Secondary | ICD-10-CM | POA: Diagnosis not present

## 2019-04-16 DIAGNOSIS — R946 Abnormal results of thyroid function studies: Secondary | ICD-10-CM | POA: Diagnosis present

## 2019-04-16 DIAGNOSIS — R4182 Altered mental status, unspecified: Secondary | ICD-10-CM

## 2019-04-16 DIAGNOSIS — N183 Chronic kidney disease, stage 3 unspecified: Secondary | ICD-10-CM

## 2019-04-16 DIAGNOSIS — N179 Acute kidney failure, unspecified: Secondary | ICD-10-CM | POA: Diagnosis present

## 2019-04-16 DIAGNOSIS — G4733 Obstructive sleep apnea (adult) (pediatric): Secondary | ICD-10-CM | POA: Diagnosis present

## 2019-04-16 DIAGNOSIS — Z888 Allergy status to other drugs, medicaments and biological substances status: Secondary | ICD-10-CM

## 2019-04-16 DIAGNOSIS — Y92009 Unspecified place in unspecified non-institutional (private) residence as the place of occurrence of the external cause: Secondary | ICD-10-CM

## 2019-04-16 DIAGNOSIS — E669 Obesity, unspecified: Secondary | ICD-10-CM | POA: Diagnosis present

## 2019-04-16 DIAGNOSIS — G3189 Other specified degenerative diseases of nervous system: Secondary | ICD-10-CM | POA: Diagnosis not present

## 2019-04-16 DIAGNOSIS — G9341 Metabolic encephalopathy: Secondary | ICD-10-CM | POA: Diagnosis present

## 2019-04-16 HISTORY — DX: Acute kidney failure, unspecified: N17.9

## 2019-04-16 HISTORY — DX: Toxic effect of other metals, accidental (unintentional), initial encounter: T56.891A

## 2019-04-16 HISTORY — DX: Hyperosmolality and hypernatremia: E87.0

## 2019-04-16 LAB — COMPREHENSIVE METABOLIC PANEL
ALT: 27 U/L (ref 0–44)
AST: 19 U/L (ref 15–41)
Albumin: 4.1 g/dL (ref 3.5–5.0)
Alkaline Phosphatase: 95 U/L (ref 38–126)
Anion gap: 9 (ref 5–15)
BUN: 29 mg/dL — ABNORMAL HIGH (ref 8–23)
CO2: 22 mmol/L (ref 22–32)
Calcium: 10.1 mg/dL (ref 8.9–10.3)
Chloride: 109 mmol/L (ref 98–111)
Creatinine, Ser: 2.13 mg/dL — ABNORMAL HIGH (ref 0.44–1.00)
GFR calc Af Amer: 27 mL/min — ABNORMAL LOW (ref >60)
GFR calc non Af Amer: 23 mL/min — ABNORMAL LOW (ref >60)
Glucose, Bld: 108 mg/dL — ABNORMAL HIGH (ref 70–99)
Potassium: 4.1 mmol/L (ref 3.5–5.1)
Sodium: 140 mmol/L (ref 135–145)
Total Bilirubin: 0.5 mg/dL (ref 0.3–1.2)
Total Protein: 6.6 g/dL (ref 6.5–8.1)

## 2019-04-16 LAB — CBC
HCT: 38.8 % (ref 36.0–46.0)
Hemoglobin: 12.4 g/dL (ref 12.0–15.0)
MCH: 31.8 pg (ref 26.0–34.0)
MCHC: 32 g/dL (ref 30.0–36.0)
MCV: 99.5 fL (ref 80.0–100.0)
Platelets: 238 10*3/uL (ref 150–400)
RBC: 3.9 MIL/uL (ref 3.87–5.11)
RDW: 14.7 % (ref 11.5–15.5)
WBC: 9.1 10*3/uL (ref 4.0–10.5)
nRBC: 0 % (ref 0.0–0.2)

## 2019-04-16 LAB — LITHIUM LEVEL: Lithium Lvl: 1.36 mmol/L — ABNORMAL HIGH (ref 0.60–1.20)

## 2019-04-16 MED ORDER — SODIUM CHLORIDE 0.9% FLUSH
3.0000 mL | Freq: Once | INTRAVENOUS | Status: AC
  Administered 2019-04-17: 3 mL via INTRAVENOUS

## 2019-04-16 MED ORDER — SODIUM CHLORIDE 0.9 % IV SOLN: INTRAVENOUS | Status: DC

## 2019-04-16 MED ORDER — SODIUM CHLORIDE 0.9 % IV BOLUS (SEPSIS)
1000.0000 mL | Freq: Once | INTRAVENOUS | Status: AC
  Administered 2019-04-17: 1000 mL via INTRAVENOUS

## 2019-04-16 MED ORDER — SODIUM CHLORIDE 0.9 % IV BOLUS (SEPSIS): 500.0000 mL | Freq: Once | INTRAVENOUS | Status: DC

## 2019-04-16 NOTE — ED Triage Notes (Addendum)
Pt arrives to ED w/ c/o AMS. Pt's daughter states over the last two days patient's mental faculties have been declining and pt has become weaker (able to ambulate at baseline). Pt's daughter notes that symptoms have happened in the past and pt was found to be in lithium toxicity. AOx3 currently.
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
## 2019-04-17 ENCOUNTER — Encounter (HOSPITAL_COMMUNITY): Payer: Self-pay | Admitting: Internal Medicine

## 2019-04-17 DIAGNOSIS — G4733 Obstructive sleep apnea (adult) (pediatric): Secondary | ICD-10-CM | POA: Diagnosis present

## 2019-04-17 DIAGNOSIS — T56891A Toxic effect of other metals, accidental (unintentional), initial encounter: Secondary | ICD-10-CM

## 2019-04-17 DIAGNOSIS — R4182 Altered mental status, unspecified: Secondary | ICD-10-CM | POA: Diagnosis not present

## 2019-04-17 DIAGNOSIS — N251 Nephrogenic diabetes insipidus: Secondary | ICD-10-CM | POA: Diagnosis present

## 2019-04-17 DIAGNOSIS — G4731 Primary central sleep apnea: Secondary | ICD-10-CM | POA: Diagnosis present

## 2019-04-17 DIAGNOSIS — E669 Obesity, unspecified: Secondary | ICD-10-CM | POA: Diagnosis present

## 2019-04-17 DIAGNOSIS — Z20822 Contact with and (suspected) exposure to covid-19: Secondary | ICD-10-CM | POA: Diagnosis present

## 2019-04-17 DIAGNOSIS — M1711 Unilateral primary osteoarthritis, right knee: Secondary | ICD-10-CM | POA: Diagnosis present

## 2019-04-17 DIAGNOSIS — R251 Tremor, unspecified: Secondary | ICD-10-CM | POA: Diagnosis present

## 2019-04-17 DIAGNOSIS — N189 Chronic kidney disease, unspecified: Secondary | ICD-10-CM

## 2019-04-17 DIAGNOSIS — Z8673 Personal history of transient ischemic attack (TIA), and cerebral infarction without residual deficits: Secondary | ICD-10-CM | POA: Diagnosis not present

## 2019-04-17 DIAGNOSIS — G9341 Metabolic encephalopathy: Secondary | ICD-10-CM | POA: Diagnosis not present

## 2019-04-17 DIAGNOSIS — R946 Abnormal results of thyroid function studies: Secondary | ICD-10-CM | POA: Diagnosis present

## 2019-04-17 DIAGNOSIS — Z818 Family history of other mental and behavioral disorders: Secondary | ICD-10-CM | POA: Diagnosis not present

## 2019-04-17 DIAGNOSIS — T43595A Adverse effect of other antipsychotics and neuroleptics, initial encounter: Secondary | ICD-10-CM | POA: Diagnosis present

## 2019-04-17 DIAGNOSIS — E232 Diabetes insipidus: Secondary | ICD-10-CM | POA: Diagnosis not present

## 2019-04-17 DIAGNOSIS — I129 Hypertensive chronic kidney disease with stage 1 through stage 4 chronic kidney disease, or unspecified chronic kidney disease: Secondary | ICD-10-CM | POA: Diagnosis present

## 2019-04-17 DIAGNOSIS — Y92009 Unspecified place in unspecified non-institutional (private) residence as the place of occurrence of the external cause: Secondary | ICD-10-CM | POA: Diagnosis not present

## 2019-04-17 DIAGNOSIS — N179 Acute kidney failure, unspecified: Secondary | ICD-10-CM | POA: Diagnosis present

## 2019-04-17 DIAGNOSIS — Z833 Family history of diabetes mellitus: Secondary | ICD-10-CM | POA: Diagnosis not present

## 2019-04-17 DIAGNOSIS — Z79899 Other long term (current) drug therapy: Secondary | ICD-10-CM | POA: Diagnosis not present

## 2019-04-17 DIAGNOSIS — F259 Schizoaffective disorder, unspecified: Secondary | ICD-10-CM | POA: Diagnosis present

## 2019-04-17 DIAGNOSIS — Z888 Allergy status to other drugs, medicaments and biological substances status: Secondary | ICD-10-CM | POA: Diagnosis not present

## 2019-04-17 DIAGNOSIS — E870 Hyperosmolality and hypernatremia: Secondary | ICD-10-CM | POA: Diagnosis not present

## 2019-04-17 DIAGNOSIS — E785 Hyperlipidemia, unspecified: Secondary | ICD-10-CM | POA: Diagnosis present

## 2019-04-17 DIAGNOSIS — F419 Anxiety disorder, unspecified: Secondary | ICD-10-CM | POA: Diagnosis present

## 2019-04-17 DIAGNOSIS — N390 Urinary tract infection, site not specified: Secondary | ICD-10-CM | POA: Insufficient documentation

## 2019-04-17 DIAGNOSIS — G92 Toxic encephalopathy: Secondary | ICD-10-CM | POA: Diagnosis present

## 2019-04-17 DIAGNOSIS — R7989 Other specified abnormal findings of blood chemistry: Secondary | ICD-10-CM | POA: Diagnosis not present

## 2019-04-17 DIAGNOSIS — N1832 Chronic kidney disease, stage 3b: Secondary | ICD-10-CM | POA: Diagnosis present

## 2019-04-17 DIAGNOSIS — Z7902 Long term (current) use of antithrombotics/antiplatelets: Secondary | ICD-10-CM | POA: Diagnosis not present

## 2019-04-17 LAB — BASIC METABOLIC PANEL
Anion gap: 11 (ref 5–15)
Anion gap: 11 (ref 5–15)
Anion gap: 9 (ref 5–15)
BUN: 26 mg/dL — ABNORMAL HIGH (ref 8–23)
BUN: 28 mg/dL — ABNORMAL HIGH (ref 8–23)
BUN: 31 mg/dL — ABNORMAL HIGH (ref 8–23)
CO2: 18 mmol/L — ABNORMAL LOW (ref 22–32)
CO2: 20 mmol/L — ABNORMAL LOW (ref 22–32)
CO2: 21 mmol/L — ABNORMAL LOW (ref 22–32)
Calcium: 10.3 mg/dL (ref 8.9–10.3)
Calcium: 9 mg/dL (ref 8.9–10.3)
Calcium: 9.3 mg/dL (ref 8.9–10.3)
Chloride: 108 mmol/L (ref 98–111)
Chloride: 111 mmol/L (ref 98–111)
Chloride: 113 mmol/L — ABNORMAL HIGH (ref 98–111)
Creatinine, Ser: 1.71 mg/dL — ABNORMAL HIGH (ref 0.44–1.00)
Creatinine, Ser: 1.79 mg/dL — ABNORMAL HIGH (ref 0.44–1.00)
Creatinine, Ser: 2 mg/dL — ABNORMAL HIGH (ref 0.44–1.00)
GFR calc Af Amer: 29 mL/min — ABNORMAL LOW (ref >60)
GFR calc Af Amer: 33 mL/min — ABNORMAL LOW (ref >60)
GFR calc Af Amer: 35 mL/min — ABNORMAL LOW (ref >60)
GFR calc non Af Amer: 25 mL/min — ABNORMAL LOW (ref >60)
GFR calc non Af Amer: 28 mL/min — ABNORMAL LOW (ref >60)
GFR calc non Af Amer: 30 mL/min — ABNORMAL LOW (ref >60)
Glucose, Bld: 108 mg/dL — ABNORMAL HIGH (ref 70–99)
Glucose, Bld: 98 mg/dL (ref 70–99)
Glucose, Bld: 99 mg/dL (ref 70–99)
Potassium: 4 mmol/L (ref 3.5–5.1)
Potassium: 4.3 mmol/L (ref 3.5–5.1)
Potassium: 4.3 mmol/L (ref 3.5–5.1)
Sodium: 140 mmol/L (ref 135–145)
Sodium: 140 mmol/L (ref 135–145)
Sodium: 142 mmol/L (ref 135–145)

## 2019-04-17 LAB — ACETAMINOPHEN LEVEL: Acetaminophen (Tylenol), Serum: <10 ug/mL — ABNORMAL LOW (ref 10–30)

## 2019-04-17 LAB — RESPIRATORY PANEL BY RT PCR (FLU A&B, COVID)
Influenza A by PCR: NEGATIVE
Influenza B by PCR: NEGATIVE
SARS Coronavirus 2 by RT PCR: NEGATIVE

## 2019-04-17 LAB — URINALYSIS, ROUTINE W REFLEX MICROSCOPIC
Bilirubin Urine: NEGATIVE
Glucose, UA: NEGATIVE mg/dL
Hgb urine dipstick: NEGATIVE
Ketones, ur: NEGATIVE mg/dL
Nitrite: NEGATIVE
Protein, ur: NEGATIVE mg/dL
Specific Gravity, Urine: <1.005 — ABNORMAL LOW (ref 1.005–1.030)
pH: 6.5 (ref 5.0–8.0)

## 2019-04-17 LAB — URINALYSIS, MICROSCOPIC (REFLEX): RBC / HPF: NONE SEEN RBC/hpf (ref 0–5)

## 2019-04-17 LAB — LITHIUM LEVEL
Lithium Lvl: 1.32 mmol/L — ABNORMAL HIGH (ref 0.60–1.20)
Lithium Lvl: 1.36 mmol/L — ABNORMAL HIGH (ref 0.60–1.20)
Lithium Lvl: 1.49 mmol/L — ABNORMAL HIGH (ref 0.60–1.20)

## 2019-04-17 LAB — TSH: TSH: 9.217 u[IU]/mL — ABNORMAL HIGH (ref 0.350–4.500)

## 2019-04-17 LAB — SALICYLATE LEVEL: Salicylate Lvl: <7 mg/dL — ABNORMAL LOW (ref 7.0–30.0)

## 2019-04-17 LAB — HIV ANTIBODY (ROUTINE TESTING W REFLEX): HIV Screen 4th Generation wRfx: NONREACTIVE

## 2019-04-17 LAB — CBG MONITORING, ED: Glucose-Capillary: 90 mg/dL (ref 70–99)

## 2019-04-17 LAB — T4, FREE: Free T4: 0.77 ng/dL (ref 0.61–1.12)

## 2019-04-17 MED ORDER — SODIUM CHLORIDE 0.9 % IV SOLN: INTRAVENOUS | Status: DC

## 2019-04-17 MED ORDER — ACETAMINOPHEN 325 MG PO TABS
650.0000 mg | ORAL_TABLET | Freq: Four times a day (QID) | ORAL | Status: DC | PRN
  Administered 2019-04-17 – 2019-04-19 (×7): 650 mg via ORAL
  Filled 2019-04-17 (×7): qty 2

## 2019-04-17 MED ORDER — ADULT MULTIVITAMIN W/MINERALS CH
1.0000 | ORAL_TABLET | Freq: Every day | ORAL | Status: DC
  Administered 2019-04-17 – 2019-04-23 (×7): 1 via ORAL
  Filled 2019-04-17 (×7): qty 1

## 2019-04-17 MED ORDER — FLUPHENAZINE HCL 1 MG PO TABS
4.0000 mg | ORAL_TABLET | Freq: Every day | ORAL | Status: DC
  Administered 2019-04-17 – 2019-04-22 (×6): 4 mg via ORAL
  Filled 2019-04-17 (×6): qty 4

## 2019-04-17 MED ORDER — SODIUM CHLORIDE 0.9 % IV SOLN
1.0000 g | INTRAVENOUS | Status: DC
  Administered 2019-04-17 – 2019-04-19 (×3): 1 g via INTRAVENOUS
  Filled 2019-04-17: qty 1
  Filled 2019-04-17: qty 10
  Filled 2019-04-17: qty 1

## 2019-04-17 MED ORDER — HEPARIN SODIUM (PORCINE) 5000 UNIT/ML IJ SOLN
5000.0000 [IU] | Freq: Three times a day (TID) | INTRAMUSCULAR | Status: DC
  Administered 2019-04-17 – 2019-04-23 (×19): 5000 [IU] via SUBCUTANEOUS
  Filled 2019-04-17 (×19): qty 1

## 2019-04-17 MED ORDER — FERROUS SULFATE 325 (65 FE) MG PO TABS
325.0000 mg | ORAL_TABLET | Freq: Every day | ORAL | Status: DC
  Administered 2019-04-18 – 2019-04-23 (×6): 325 mg via ORAL
  Filled 2019-04-17 (×8): qty 1

## 2019-04-17 MED ORDER — CLOPIDOGREL BISULFATE 75 MG PO TABS
75.0000 mg | ORAL_TABLET | Freq: Every day | ORAL | Status: DC
  Administered 2019-04-17 – 2019-04-23 (×7): 75 mg via ORAL
  Filled 2019-04-17 (×7): qty 1

## 2019-04-17 NOTE — ED Notes (Signed)
Tele   bfast ordered  
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
## 2019-04-17 NOTE — Progress Notes (Signed)
Patient placed in observation after midnight but care began prior to midnight.  Here with lithium toxicity.  Patient getting labs q 4 hours and being monitored by poison control.   Psych consult placed in ER by admitting as this is her 2nd toxicity admission in the last 1 year. Eulogio Bear DO

## 2019-04-17 NOTE — ED Notes (Signed)
Report called to [facility] RN

## 2019-04-18 ENCOUNTER — Telehealth: Payer: Self-pay | Admitting: Psychiatry

## 2019-04-18 DIAGNOSIS — G9341 Metabolic encephalopathy: Secondary | ICD-10-CM

## 2019-04-18 DIAGNOSIS — E870 Hyperosmolality and hypernatremia: Secondary | ICD-10-CM | POA: Diagnosis not present

## 2019-04-18 LAB — BASIC METABOLIC PANEL
Anion gap: 9 (ref 5–15)
BUN: 20 mg/dL (ref 8–23)
CO2: 19 mmol/L — ABNORMAL LOW (ref 22–32)
Calcium: 9.1 mg/dL (ref 8.9–10.3)
Chloride: 119 mmol/L — ABNORMAL HIGH (ref 98–111)
Creatinine, Ser: 1.48 mg/dL — ABNORMAL HIGH (ref 0.44–1.00)
GFR calc Af Amer: 41 mL/min — ABNORMAL LOW (ref >60)
GFR calc non Af Amer: 36 mL/min — ABNORMAL LOW (ref >60)
Glucose, Bld: 96 mg/dL (ref 70–99)
Potassium: 4 mmol/L (ref 3.5–5.1)
Sodium: 147 mmol/L — ABNORMAL HIGH (ref 135–145)

## 2019-04-18 LAB — CBC
HCT: 37 % (ref 36.0–46.0)
Hemoglobin: 11.3 g/dL — ABNORMAL LOW (ref 12.0–15.0)
MCH: 31.7 pg (ref 26.0–34.0)
MCHC: 30.5 g/dL (ref 30.0–36.0)
MCV: 103.6 fL — ABNORMAL HIGH (ref 80.0–100.0)
Platelets: 185 10*3/uL (ref 150–400)
RBC: 3.57 MIL/uL — ABNORMAL LOW (ref 3.87–5.11)
RDW: 14.8 % (ref 11.5–15.5)
WBC: 6.5 10*3/uL (ref 4.0–10.5)
nRBC: 0 % (ref 0.0–0.2)

## 2019-04-18 LAB — URINE CULTURE

## 2019-04-18 LAB — LITHIUM LEVEL: Lithium Lvl: 0.86 mmol/L (ref 0.60–1.20)

## 2019-04-18 NOTE — Telephone Encounter (Signed)
POA Debbie called to report Pt Bridget Jackson is in hospital with lithium level toxic. Please return call @ [phone]. Left message on v-mail late 04/17/19.

## 2019-04-18 NOTE — Telephone Encounter (Addendum)
Sister [name] noticed pt had LE edema and some slight confusion. Sister reports that pt became more confused over the weekend. Debbie then took her to ER. She was determined to be Li+ toxic with level of 1.36.   Sister reports that pt's Lithium level is now WNL. Sister reports that pt's weakness and confusion have improved.   Sister reports that in the past when pt was taken off Lithium she became "almost catatonic." Sister reports that pt had manic s/s in her 75's. Sister reports that she has noticed more depressive episodes than manic s/s in pt's lifetime.  Spoke with Eulogio Bear, DO and reviewed hx and current hospitalization. Agreed that based on this information, it would be best to consider an alternative mood stabilizer, such as Depakote ER, and not re-starting Lithium. Discussed starting lower dose Depakote ER 500 mg po QHS for mood stabilization and continuing Prolixin. Discussed that pt could be seen for follow-up in the office after discharge.   Contacted pt's sister to discuss conversation with Dr. Eliseo Squires and plan. Pt was also present and participated in conversation. Discussed concerns with continuing Lithium since she has had 2 episodes of Lithium toxicity, increased creatinine levels, and changes in other electrolytes. They report that they had also just met with Dr. Eliseo Squires and were aware of these concerns. Discussed option of starting Depakote ER in place of Lithium and reviewed potential benefits, risks, and side effects of Depakote ER. Pt and her sister agree with this plan. Discussed that Depakote ER may be initiated while she is in the hospital or could be started by this provider after discharge. Discussed that follow-up apt could be arranged soon after discharge. Encouraged pt and her sister to contact this provider with any questions.

## 2019-04-18 NOTE — TOC Initial Note (Signed)
Transition of Care [facility]) - Initial/Assessment Note    Patient Details  Name: Bridget Jackson MRN: [id number] Date of Birth: 18-Mar-1950  [facility] CM/SW Contact:    Bridget Mt, LCSW Phone Number: 04/18/2019, 4:07 PM  Clinical Narrative:                 CSW spoke with pt and pt sister Bridget Jackson [id number]) at bedside. Introduced self, role, reason for visit. Pt from [facility] [place], [place], [place], [address]). Pt sister and pt confirm that she has a rollator, 3n1, adjustable bed, lift chair, walk in shower w/ bench, grab bars, hand held shower, reacher, sock grabber, and wheelchair.   Pt has had her two doses of COVID vaccine, the staff at the facility remains diligent. We discussed that pt has used [facility] before and that they prefer [facility] [place] (PT/OT). We also discussed that should this not work out with home health they are interested in having a CSW also ordered for placement support.   Pt motivated and ready to work hard to try and avoid going to a SNF. Her sister is able to provide support and can hire some additional support in the short term.   TOC team has placed orders for [facility] therapies, has notified Bridget Jackson with [facility] regarding preferred providers and CSW.   We will follow, pt sister to transport when pt ready for home.   Expected Discharge Plan: [place] Barriers to Discharge: Continued Medical Work up   Patient Goals and CMS Choice Patient states their goals for this hospitalization and ongoing recovery are:: return home and get stronger with [facility] Medicare.gov Compare Post Acute Care list provided to:: Patient Represenative (must comment)(pt sister and pt prefer [facility], already familiar with services) Choice offered to / list presented to : Patient, Sibling  Expected Discharge Plan and Services Expected Discharge Plan: [place] In-house Referral: Clinical Social Work Discharge  Planning Services: CM Consult Post Acute Care Choice: [place] arrangements for the past 2 months: [place]  Prior Living Arrangements/Services Living arrangements for the past 2 months: [place] Lives with:: Self, Facility Resident Patient language and need for interpreter reviewed:: Yes(no needs) Do you feel safe going back to the place where you live?: Yes      Need for Family Participation in Patient Care: Yes (Comment)(support/assistance with daily cares) Care giver support system in place?: Yes (comment)(adult siblings, facility staff) Current home services: DME Criminal Activity/Legal Involvement Pertinent to Current Situation/Hospitalization: No - Comment as needed  Activities of Daily Living Home Assistive Devices/Equipment: Walker (specify type), Hand-held shower hose, Raised toilet seat with rails ADL Screening (condition at time of admission) Patient's cognitive ability adequate to safely complete daily activities?: Yes Is the patient deaf or have difficulty hearing?: No Does the patient have difficulty seeing, even when wearing glasses/contacts?: No Does the patient have difficulty concentrating, remembering, or making decisions?: Yes Patient able to express need for assistance with ADLs?: Yes Does the patient have difficulty dressing or bathing?: No Independently performs ADLs?: Yes (appropriate for developmental age) Does the patient have difficulty walking or climbing stairs?: Yes Weakness of Legs: Both Weakness of Arms/Hands: None  Permission Sought/Granted Permission sought to share information with : Facility Sport and exercise psychologist, Family Supports Permission granted to share information with : Yes, Verbal Permission Granted  Share Information with NAME: Bridget Jackson  Permission granted to share info w AGENCY: [place]/[facility]  Permission granted to share info w Relationship: sister  Permission granted to share  info w Contact Information: [phone]  Emotional Assessment Appearance:: Appears stated age Attitude/Demeanor/Rapport: Gracious, Engaged Affect (typically observed): Accepting, Adaptable, Appropriate, Pleasant Orientation: : Oriented to Self, Oriented to Place, Oriented to  Time, Oriented to Situation Alcohol / Substance Use: Not Applicable Psych Involvement: Outpatient Provider  Admission diagnosis:  Lithium toxicity [T56.891A] Lithium toxicity, accidental or unintentional, initial encounter [T56.891A] Altered mental status, unspecified altered mental status type [R41.82] Acute kidney injury superimposed on chronic kidney disease ([place]) [N17.9, N18.9] Patient Active Problem List   Diagnosis Date Noted  . Hypernatremia 04/18/2019  . UTI (urinary tract infection) 04/17/2019  . Acute metabolic encephalopathy 99991111  . T wave inversion in electrocardiogram 03/17/2018  . Lithium toxicity 03/17/2018  . Nystagmus 03/17/2018  . AKI (acute kidney injury) ([place]) 03/17/2018  . Normal anion gap metabolic acidosis 99991111  . Hyperkalemia 03/17/2018  . Altered mental status   . Hypotension   . 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 CPAP ventilation treatment not tolerated 11/17/2017  . Excessive daytime sleepiness 11/05/2017  . Severe sleep apnea 11/05/2017  . Left shoulder pain 08/31/2017  . Pyuria 08/30/2017  . Debility 08/30/2017  . Schizoaffective disorder ([place]) 08/30/2017  . Hyperlipidemia 08/30/2017  . Essential hypertension 08/30/2017   PCP:  Bridget Lass, MD Pharmacy:   [facility] [place], [place] [address] [place] [address] Phone: [phone] Fax: [phone]   Readmission Risk Interventions Readmission Risk Prevention Plan 04/18/2019  Post Dischage Appt Not Complete  Appt Comments pending medical stability  Medication Screening Complete  Transportation Screening Complete  Some  recent data might be hidden

## 2019-04-18 NOTE — Progress Notes (Signed)
Progress Note    Bridget Jackson  [id number] DOB: 06-07-1950  DOA: 04/16/2019 PCP: Kathyrn Lass, MD    Brief Narrative:   Chief complaint: acute encephalopathy  Medical records reviewed and are as summarized below:  Bridget Jackson is an 69 y.o. female with a past medical history significant for schizoaffective disorder, hypertension, anemia, anxiety, obstructive sleep apnea, CVA on Plavix, chronic kidney disease stage III presented to the emergency department March 15 chief complaint of acute encephalopathy.  Work-up reveals lithium toxicity in the setting of acute kidney injury superimposed on chronic kidney disease stage III and questionable UTI.  She was provided with IV fluids and lithium was held.   Assessment/Plan:   Principal Problem:   Acute metabolic encephalopathy Active Problems:   Lithium toxicity   Schizoaffective disorder (HCC)   AKI (acute kidney injury) ([place])   Hypernatremia   UTI (urinary tract infection)  #1.  Acute metabolic encephalopathy likely related to lithium toxicity in the setting of acute kidney injury superimposed on chronic kidney disease stage III.  CT of her head with no acute intracranial abnormalities.  Lithium level 1.32 on presentation.   This morning lithium is within the limits of normal.  Patient was provided with IV fluids and lithium was held.  She is much more alert and oriented this morning but still not at baseline.  Creatinine 2.13 on presentation.  Chart review indicates her baseline creatinine close to 1.35-1.4  -Continue to hold lithium -PT evaluation -Stop IV fluids -Mobilize -Reevaluate in the morning  #2.  Lithium toxicity.  Likely related to worsening renal function.  Her baseline creatinine appears to be 1.35-1.4.  Creatinine was 2.13 on presentation.  Patient had hospitalization prior for same.  May need evaluation of lithium dose and or outpatient nephrology consult -Continue to hold lithium for now -Recheck level in  the morning to see how it trends -Likely discharge on same lithium dose and defer changes to her outpatient provider  3.  Acute kidney injury superimposed on chronic kidney disease stage III.  Baseline creatinine as noted above.  During if there is a gradual worsening of kidney function given recurrence of lithium toxicity without any dose changes.  Home medication list evaluated and no other nephrotoxins noted.  Urine output good.  Provided with gentle IV fluids.  Creatinine trending down this morning. -Stop IV fluids to evaluate for patient's ability to maintain oral hydration -Hold nephrotoxins -Monitor urine output -Recheck in the morning  #4.  Hypernatremia.  Mild.  Likely related to vigorous IV fluids provided with since admission.  Patient appears little puffy on exam. -Stop IV fluids -Recheck in the morning  #5.  Urinary tract infection.  Questionable.  Rocephin was started.  Culture recommends recollecting.  She is afebrile hemodynamically stable no leukocytosis no dysuria. -We will complete a 3-day course  #6.  Schizoaffective disorder.  Stable at baseline. -Outpatient follow-up -Doing home meds as indicated    Family Communication/Anticipated D/C date and plan/Code Status   DVT prophylaxis: heparin Code Status: Full Code.  Family Communication: sister at bedside Disposition Plan: back home    Medical Consultants:    None.   Anti-Infectives:    None  Subjective:   Awake but drowsy and oriented x3.  Denies pain/discomfort.  She is somewhat lethargic Objective:    Vitals:   04/17/19 1246 04/17/19 2301 04/18/19 0503 04/18/19 0849  BP: 103/63 (!) 101/56 120/62 (!) 96/48  Pulse: (!) 57 64 62   Resp: 18 17  16 16  Temp: (!) 97.5 F (36.4 C)  98.3 F (36.8 C)   TempSrc: Oral  Oral   SpO2: 96% 97% 95% 99%    Intake/Output Summary (Last 24 hours) at 04/18/2019 1155 Last data filed at 04/18/2019 0845 Gross per 24 hour  Intake 3053.79 ml  Output 3200 ml    Net -146.21 ml   There were no vitals filed for this visit.  Exam: General: Well-nourished calm cooperative no acute distress CV: Regular rate and rhythm no murmur gallop or rub trace lower extremity edema Respiratory: No increased work of breathing breath sounds are clear bilaterally I hear no rhonchi no wheezes Abdomen obese soft positive bowel sounds throughout nontender to palpation no mass organomegaly noted Musculoskeletal: Joints without swelling/erythema full range of motion Neuro: Drowsy but easily aroused and oriented x3.  Speech clear facial symmetry cranial nerves II through XII grossly intact follows commands  Data Reviewed:   I have personally reviewed following labs and imaging studies:  Labs: Labs show the following:   Basic Metabolic Panel: Recent Labs  Lab 04/16/19 2107 04/16/19 2107 04/17/19 0015 04/17/19 0015 04/17/19 0315 04/17/19 0315 04/17/19 0822 04/18/19 0503  NA 140  --  140  --  140  --  142 147*  K 4.1   < > 4.3   < > 4.0   < > 4.3 4.0  CL 109  --  108  --  113*  --  111 119*  CO2 22  --  21*  --  18*  --  20* 19*  GLUCOSE 108*  --  98  --  108*  --  99 96  BUN 29*  --  31*  --  28*  --  26* 20  CREATININE 2.13*  --  2.00*  --  1.79*  --  1.71* 1.48*  CALCIUM 10.1  --  10.3  --  9.3  --  9.0 9.1   < > = values in this interval not displayed.   GFR CrCl cannot be calculated (Unknown ideal weight.). Liver Function Tests: Recent Labs  Lab 04/16/19 2107  AST 19  ALT 27  ALKPHOS 95  BILITOT 0.5  PROT 6.6  ALBUMIN 4.1   No results for input(s): LIPASE, AMYLASE in the last 168 hours. No results for input(s): AMMONIA in the last 168 hours. Coagulation profile No results for input(s): INR, PROTIME in the last 168 hours.  CBC: Recent Labs  Lab 04/16/19 2107 04/18/19 0503  WBC 9.1 6.5  HGB 12.4 11.3*  HCT 38.8 37.0  MCV 99.5 103.6*  PLT 238 185   Cardiac Enzymes: No results for input(s): CKTOTAL, CKMB, CKMBINDEX, TROPONINI in  the last 168 hours. BNP (last 3 results) No results for input(s): PROBNP in the last 8760 hours. CBG: Recent Labs  Lab 04/17/19 0012  GLUCAP 90   D-Dimer: No results for input(s): DDIMER in the last 72 hours. Hgb A1c: No results for input(s): HGBA1C in the last 72 hours. Lipid Profile: No results for input(s): CHOL, HDL, LDLCALC, TRIG, CHOLHDL, LDLDIRECT in the last 72 hours. Thyroid function studies: Recent Labs    04/17/19 0315  TSH 9.217*   Anemia work up: No results for input(s): VITAMINB12, FOLATE, FERRITIN, TIBC, IRON, RETICCTPCT in the last 72 hours. Sepsis Labs: Recent Labs  Lab 04/16/19 2107 04/18/19 0503  WBC 9.1 6.5    Microbiology Recent Results (from the past 240 hour(s))  Urine culture     Status: Abnormal   Collection  Time: 04/16/19 11:22 PM   Specimen: Urine, Random  Result Value Ref Range Status   Specimen Description URINE, RANDOM  Final   Special Requests   Final    NONE Performed at [facility], [address] [place], [place] [address]    Culture MULTIPLE SPECIES PRESENT, SUGGEST RECOLLECTION (A)  Final   Report Status 04/18/2019 FINAL  Final  Respiratory Panel by RT PCR (Flu A&B, Covid) - Nasopharyngeal Swab     Status: None   Collection Time: 04/17/19 12:27 AM   Specimen: Nasopharyngeal Swab  Result Value Ref Range Status   SARS Coronavirus 2 by RT PCR NEGATIVE NEGATIVE Final    Comment: (NOTE) SARS-CoV-2 target nucleic acids are NOT DETECTED. The SARS-CoV-2 RNA is generally detectable in upper respiratoy specimens during the acute phase of infection. The lowest concentration of SARS-CoV-2 viral copies this assay can detect is 131 copies/mL. A negative result does not preclude SARS-Cov-2 infection and should not be used as the sole basis for treatment or other patient management decisions. A negative result may occur with  improper specimen collection/handling, submission of specimen other than nasopharyngeal swab, presence of  viral mutation(s) within the areas targeted by this assay, and inadequate number of viral copies (<131 copies/mL). A negative result must be combined with clinical observations, patient history, and epidemiological information. The expected result is Negative. Fact Sheet for Patients:  PinkCheek.be Fact Sheet for Healthcare Providers:  GravelBags.it This test is not yet ap proved or cleared by the [place] FDA and  has been authorized for detection and/or diagnosis of SARS-CoV-2 by FDA under an Emergency Use Authorization (EUA). This EUA will remain  in effect (meaning this test can be used) for the duration of the COVID-19 declaration under Section 564(b)(1) of the Act, 21 U.S.C. section 360bbb-3(b)(1), unless the authorization is terminated or revoked sooner.    Influenza A by PCR NEGATIVE NEGATIVE Final   Influenza B by PCR NEGATIVE NEGATIVE Final    Comment: (NOTE) The Xpert Xpress SARS-CoV-2/FLU/RSV assay is intended as an aid in  the diagnosis of influenza from Nasopharyngeal swab specimens and  should not be used as a sole basis for treatment. Nasal washings and  aspirates are unacceptable for Xpert Xpress SARS-CoV-2/FLU/RSV  testing. Fact Sheet for Patients: PinkCheek.be Fact Sheet for Healthcare Providers: GravelBags.it This test is not yet approved or cleared by the [place] FDA and  has been authorized for detection and/or diagnosis of SARS-CoV-2 by  FDA under an Emergency Use Authorization (EUA). This EUA will remain  in effect (meaning this test can be used) for the duration of the  Covid-19 declaration under Section 564(b)(1) of the Act, 21  U.S.C. section 360bbb-3(b)(1), unless the authorization is  terminated or revoked. Performed at [facility], [place] [address] [place], [place] [address]     Procedures and diagnostic  studies:  CT Head Wo Contrast  Result Date: 04/16/2019 CLINICAL DATA:  Encephalopathy EXAM: CT HEAD WITHOUT CONTRAST TECHNIQUE: Contiguous axial images were obtained from the base of the skull through the vertex without intravenous contrast. COMPARISON:  None. FINDINGS: Brain: There is no mass, hemorrhage or extra-axial collection. The appearance of the white matter is normal for the patient's age. There is generalized atrophy. Cystic focus in the left lentiform nucleus region, likely enlarged perivascular space. Vascular: No abnormal hyperdensity of the major intracranial arteries or dural venous sinuses. No intracranial atherosclerosis. Skull: The visualized skull base, calvarium and extracranial soft tissues are normal.  Sinuses/Orbits: No fluid levels or advanced mucosal thickening of the visualized paranasal sinuses. No mastoid or middle ear effusion. The orbits are normal. IMPRESSION: 1. No acute intracranial abnormality. 2. Generalized volume loss. Electronically Signed   By: Ulyses Jarred M.D.   On: 04/16/2019 23:52    Medications:   . clopidogrel  75 mg Oral Daily  . ferrous sulfate  325 mg Oral Q breakfast  . fluPHENAZine  4 mg Oral QHS  . heparin  5,000 Units Subcutaneous Q8H  . multivitamin with minerals  1 tablet Oral Daily   Continuous Infusions: . cefTRIAXone (ROCEPHIN)  IV 1 g (04/18/19 0321)     LOS: 1 day   Radene Gunning NP  [facility]   How to contact the [facility] Attending or Consulting provider [place] or covering provider during after hours [place], for this patient?  1. Check the care team in [facility] and look for a) attending/consulting TRH provider listed and b) the [facility] team listed 2. Log into [email or web address] and use [place]'s universal password to access. If you do not have the password, please contact the hospital operator. 3. Locate the [facility] provider you are looking for under [facility] and page to a number that you can be directly reached. 4. If you  still have difficulty reaching the provider, please page the [facility] (Director on Call) for the Hospitalists listed on amion for assistance.  04/18/2019, 11:55 AM

## 2019-04-18 NOTE — Evaluation (Signed)
Occupational Therapy Evaluation Patient Details Name: Bridget Jackson MRN: [id number] DOB: 09/16/1950 Today's Date: 04/18/2019    History of Present Illness Pt is a 69 y.o. female admitted from [facility] on 04/16/19 with AMS and generalized weakness. Head CT negative for acute intracranial abnormality. Worked up for lithium toxicity, AKI and possible UTI. PMH includes schizoaffective disorder (on lithium), HTN, CVA (on Plavix), CKD III, arthritis, anxiety, depression; admission for lithium toxicity in 2020.   Clinical Impression   Pt PTA: pt living in ILF, using rollator for mobility; assist/supervisionA for bathing. Pt currently performing ADL functional mobility with minguardA with RW (sister brought rollator for next session as OT session had concluded). Pt supervisionA to minguardA for ADL in standing. Pt tolerating standing ~5 mins for sink ADL. Pt takes very slow cautious steps. Pt with cognitive deficits at baseline, but pt appears to answer questions appropriately and takes increased time to follow commands. Pt would benefit from continued OT skilled services. OT following acutely.     Follow Up Recommendations  Home health OT;Supervision/Assistance - 24 hour(initially)    Equipment Recommendations  None recommended by OT    Recommendations for Other Services       Precautions / Restrictions Precautions Precautions: Fall;Other (comment) Precaution Comments: Bladder incontinence Restrictions Weight Bearing Restrictions: No      Mobility Bed Mobility Overal bed mobility: Needs Assistance Bed Mobility: Supine to Sit     Supine to sit: Supervision;HOB elevated     General bed mobility comments: Pt verbal cues to scoot to EOB.  Transfers Overall transfer level: Needs assistance Equipment used: Rolling walker (2 wheeled) Transfers: Sit to/from Stand Sit to Stand: Min guard         General transfer comment: stand from bed X1 and stand from [facility] over commode; minguardA     Balance Overall balance assessment: Needs assistance   Sitting balance-Leahy Scale: Fair       Standing balance-Leahy Scale: Poor Standing balance comment: Reliant on UE support; cues to look upright                           ADL either performed or assessed with clinical judgement   ADL Overall ADL's : At baseline                                       General ADL Comments: Pt supervisionA level for ADL, requiring increased time for all tasks. Pt standing at sink for self care and performing own toilet hygiene in standing at commode for pericare. Pt with very slow gait using RW.     Vision Baseline Vision/History: No visual deficits Patient Visual Report: No change from baseline Vision Assessment?: No apparent visual deficits     Perception     Praxis      Pertinent Vitals/Pain Pain Assessment: Faces Faces Pain Scale: Hurts a little bit Pain Location: R hip Pain Descriptors / Indicators: Discomfort Pain Intervention(s): Monitored during session     Hand Dominance Right   Extremity/Trunk Assessment Upper Extremity Assessment Upper Extremity Assessment: Overall WFL for tasks assessed   Lower Extremity Assessment Lower Extremity Assessment: Generalized weakness   Cervical / Trunk Assessment Cervical / Trunk Assessment: Kyphotic   Communication Communication Communication: No difficulties   Cognition Arousal/Alertness: Awake/alert Behavior During Therapy: WFL for tasks assessed/performed Overall Cognitive Status: History of cognitive impairments - at baseline  General Comments: Pt's sister reports she is nearing cognitive baseline; answering all commands with increased time.   General Comments  Sister present; retrieved pt's rollator from her car.    Exercises     Shoulder Instructions      Home Living Family/patient expects to be discharged to:: Assisted living Living  Arrangements: Alone                           Home Equipment: Gilford Rile - 2 wheels;Walker - 4 wheels;Grab bars - toilet;Grab bars - tub/shower;Toilet riser;Tub bench;Wheelchair - manual   Additional Comments: Resides in apartment at [employer]      Prior Functioning/Environment Level of Independence: Needs assistance  Gait / Transfers Assistance Needed: Mod indep household ambulator with rollator; uses w/Jackson when going out in community ADL's / Homemaking Assistance Needed: Staff OT or family always present to assist with bathing; pt able to transfer into walk-in shower, uses tub bench. Sister provides most meals; pt able to prepare simple meals            OT Problem List: Decreased strength;Decreased activity tolerance;Impaired balance (sitting and/or standing);Decreased safety awareness;Pain      OT Treatment/Interventions: Self-care/ADL training;Therapeutic exercise;Energy conservation;Therapeutic activities;Patient/family education;Balance training    OT Goals(Current goals can be found in the care plan section) Acute Rehab OT Goals Patient Stated Goal: to go home OT Goal Formulation: With patient/family Time For Goal Achievement: 05/02/19 Potential to Achieve Goals: Good ADL Goals Pt Will Perform Lower Body Dressing: with modified independence;sitting/lateral leans;sit to/from stand Pt/caregiver will Perform Home Exercise Program: Increased strength;Both right and left upper extremity;With Supervision Additional ADL Goal #1: Pt will complete multi-step commands within ADL tasks with 90% accuracy in 2/2 trials to continue to assess safety with return home.  OT Frequency: Min 2X/week   Barriers to D/Jackson:            Co-evaluation              AM-PAC OT "6 Clicks" Daily Activity     Outcome Measure Help from another person eating meals?: None Help from another person taking care of personal grooming?: A Little Help from another person toileting,  which includes using toliet, bedpan, or urinal?: A Little Help from another person bathing (including washing, rinsing, drying)?: A Little Help from another person to put on and taking off regular upper body clothing?: None Help from another person to put on and taking off regular lower body clothing?: A Little 6 Click Score: 20   End of Session Equipment Utilized During Treatment: Gait belt;Rolling walker Nurse Communication: Mobility status  Activity Tolerance: Patient tolerated treatment well Patient left: in bed;with call bell/phone within reach;with family/visitor present  OT Visit Diagnosis: Unsteadiness on feet (R26.81);Muscle weakness (generalized) (M62.81);Pain Pain - Right/Left: Right Pain - part of body: Hip                Time: 1535-1610 OT Time Calculation (min): 35 min Charges:  OT General Charges $OT Visit: 1 Visit OT Evaluation $OT Eval Moderate Complexity: 1 Mod OT Treatments $Self Care/Home Management : 8-22 mins  Jefferey Pica, OTR/L Acute Rehabilitation Services Pager: [phone] Office: [phone]   Bridget Jackson 04/18/2019, 5:07 PM

## 2019-04-18 NOTE — Evaluation (Signed)
Physical Therapy Evaluation Patient Details Name: Bridget Jackson MRN: [id number] DOB: 1950/09/18 Today's Date: 04/18/2019   History of Present Illness  Pt is a 69 y.o. female admitted from [facility] on 04/16/19 with AMS and generalized weakness. Head CT negative for acute intracranial abnormality. Worked up for lithium toxicity, AKI and possible UTI. PMH includes schizoaffective disorder (on lithium), HTN, CVA (on Plavix), CKD III, arthritis, anxiety, depression; admission for lithium toxicity in 2020.    Clinical Impression  Pt presents with an overall decrease in functional mobility secondary to above. PTA, pt lives alone at independent living facility, mod indep with rollator, family assists with household tasks as needed. Today, pt required up to [facility] for mobility; limited by generalized weakness, decreased activity tolerance and poor balance strategies; at high risk for falls. Sister present to discuss d/c planning with pt. Based on current level of function, recommend SNF-level therapies to maximize functional mobility and independence prior to return home. Sister and patient to discuss this versus return home with 24/7 assist.     Follow Up Recommendations SNF;Supervision/Assistance - 24 hour    Equipment Recommendations  None recommended by PT    Recommendations for Other Services       Precautions / Restrictions Precautions Precautions: Fall;Other (comment) Precaution Comments: Bladder incontinence Restrictions Weight Bearing Restrictions: No      Mobility  Bed Mobility Overal bed mobility: Needs Assistance Bed Mobility: Supine to Sit     Supine to sit: Min assist;HOB elevated     General bed mobility comments: Increased time and effort, minA to assist scooting R hip to EOB  Transfers Overall transfer level: Needs assistance Equipment used: Rolling walker (2 wheeled) Transfers: Sit to/from Stand Sit to Stand: Min guard         General transfer comment: Able to  stand from EOB and recliner to RW with min guard; pt with good hand placement; increased time and effort  Ambulation/Gait Ambulation/Gait assistance: Min guard Gait Distance (Feet): 8 Feet Assistive device: Rolling walker (2 wheeled) Gait Pattern/deviations: Step-through pattern;Decreased stride length;Trunk flexed Gait velocity: Decreased Gait velocity interpretation: <1.31 ft/sec, indicative of household ambulator General Gait Details: Steps to recliner with RW and close min guard, seated rest, then additional slow, mildly unsteady steps forwards/backwards; further distance limited by fatigue  Stairs            Wheelchair Mobility    Modified Rankin (Stroke Patients Only)       Balance Overall balance assessment: Needs assistance   Sitting balance-Leahy Scale: Fair Sitting balance - Comments: Reliant on assist to don socks     Standing balance-Leahy Scale: Poor Standing balance comment: Reliant on UE support                             Pertinent Vitals/Pain Pain Assessment: Faces Faces Pain Scale: Hurts a little bit Pain Location: R hip Pain Descriptors / Indicators: Discomfort Pain Intervention(s): Monitored during session;Repositioned    Home Living Family/patient expects to be discharged to:: Assisted living Living Arrangements: Alone             Home Equipment: Walker - 2 wheels;Walker - 4 wheels;Grab bars - toilet;Grab bars - tub/shower;Toilet riser;Tub bench;Wheelchair - manual Additional Comments: Resides in apartment at [employer]    Prior Function Level of Independence: Needs assistance   Gait / Transfers Assistance Needed: Mod indep household ambulator with rollator; uses w/c when going out in community  ADL's /  Homemaking Assistance Needed: Staff OT or family always present to assist with bathing; pt able to transfer into walk-in shower, uses tub bench. Sister provides most meals; pt able to prepare simple meals         Hand Dominance        Extremity/Trunk Assessment   Upper Extremity Assessment Upper Extremity Assessment: Overall WFL for tasks assessed    Lower Extremity Assessment Lower Extremity Assessment: Generalized weakness    Cervical / Trunk Assessment Cervical / Trunk Assessment: Kyphotic  Communication   Communication: No difficulties  Cognition Arousal/Alertness: Awake/alert Behavior During Therapy: WFL for tasks assessed/performed Overall Cognitive Status: History of cognitive impairments - at baseline                                 General Comments: Following simple commands and answering questions appropriately, sometimes with increased time; likely near baseline cognition      General Comments General comments (skin integrity, edema, etc.): Sister present and supportive - increased time discussing d/c recommendations and amount of family/caregiver support available if needed    Exercises     Assessment/Plan    PT Assessment Patient needs continued PT services  PT Problem List Decreased strength;Decreased activity tolerance;Decreased balance;Decreased mobility;Decreased cognition;Decreased knowledge of use of DME       PT Treatment Interventions DME instruction;Gait training;Functional mobility training;Therapeutic activities;Therapeutic exercise;Balance training;Patient/family education    PT Goals (Current goals can be found in the Care Plan section)  Acute Rehab PT Goals Patient Stated Goal: Hopeful to return home with help from sister; agreeable to ST SNF if needed PT Goal Formulation: With patient/family Time For Goal Achievement: 05/02/19 Potential to Achieve Goals: Good    Frequency Min 3X/week   Barriers to discharge Decreased caregiver support      Co-evaluation               AM-PAC PT "6 Clicks" Mobility  Outcome Measure Help needed turning from your back to your side while in a flat bed without using bedrails?: A  Little Help needed moving from lying on your back to sitting on the side of a flat bed without using bedrails?: A Little Help needed moving to and from a bed to a chair (including a wheelchair)?: A Little Help needed standing up from a chair using your arms (e.g., wheelchair or bedside chair)?: A Little Help needed to walk in hospital room?: A Little Help needed climbing 3-5 steps with a railing? : A Lot 6 Click Score: 17    End of Session   Activity Tolerance: Patient tolerated treatment well Patient left: in chair;with call bell/phone within reach;with family/visitor present Nurse Communication: Mobility status PT Visit Diagnosis: Other abnormalities of gait and mobility (R26.89);Muscle weakness (generalized) (M62.81)    Time: [id number] PT Time Calculation (min) (ACUTE ONLY): 24 min   Charges:   PT Evaluation $PT Eval Moderate Complexity: 1 Mod PT Treatments $Therapeutic Activity: 8-22 mins   Mabeline Caras, PT, DPT Acute Rehabilitation Services  Pager [phone] Office [place] 04/18/2019, 12:04 PM

## 2019-04-19 ENCOUNTER — Encounter (HOSPITAL_COMMUNITY): Payer: Self-pay | Admitting: Internal Medicine

## 2019-04-19 DIAGNOSIS — F259 Schizoaffective disorder, unspecified: Secondary | ICD-10-CM

## 2019-04-19 DIAGNOSIS — E870 Hyperosmolality and hypernatremia: Secondary | ICD-10-CM

## 2019-04-19 DIAGNOSIS — R7989 Other specified abnormal findings of blood chemistry: Secondary | ICD-10-CM | POA: Diagnosis present

## 2019-04-19 LAB — LITHIUM LEVEL: Lithium Lvl: 0.63 mmol/L (ref 0.60–1.20)

## 2019-04-19 LAB — SODIUM, URINE, RANDOM: Sodium, Ur: 76 mmol/L

## 2019-04-19 LAB — BASIC METABOLIC PANEL
Anion gap: 9 (ref 5–15)
BUN: 19 mg/dL (ref 8–23)
CO2: 20 mmol/L — ABNORMAL LOW (ref 22–32)
Calcium: 9.5 mg/dL (ref 8.9–10.3)
Chloride: 120 mmol/L — ABNORMAL HIGH (ref 98–111)
Creatinine, Ser: 1.45 mg/dL — ABNORMAL HIGH (ref 0.44–1.00)
GFR calc Af Amer: 42 mL/min — ABNORMAL LOW (ref >60)
GFR calc non Af Amer: 37 mL/min — ABNORMAL LOW (ref >60)
Glucose, Bld: 100 mg/dL — ABNORMAL HIGH (ref 70–99)
Potassium: 4.1 mmol/L (ref 3.5–5.1)
Sodium: 149 mmol/L — ABNORMAL HIGH (ref 135–145)

## 2019-04-19 LAB — OSMOLALITY: Osmolality: 312 mOsm/kg — ABNORMAL HIGH (ref 275–295)

## 2019-04-19 LAB — OSMOLALITY, URINE: Osmolality, Ur: 221 mOsm/kg — ABNORMAL LOW (ref 300–900)

## 2019-04-19 MED ORDER — SODIUM CHLORIDE 0.9% FLUSH
3.0000 mL | Freq: Two times a day (BID) | INTRAVENOUS | Status: DC
  Administered 2019-04-19 – 2019-04-23 (×6): 3 mL via INTRAVENOUS

## 2019-04-19 MED ORDER — SODIUM CHLORIDE 0.9% FLUSH
3.0000 mL | INTRAVENOUS | Status: DC | PRN
  Administered 2019-04-19: 3 mL via INTRAVENOUS

## 2019-04-19 MED ORDER — DEXTROSE 5 % IV SOLN
INTRAVENOUS | Status: DC
  Administered 2019-04-19: 100 mL/h via INTRAVENOUS

## 2019-04-19 MED ORDER — DIVALPROEX SODIUM ER 500 MG PO TB24
500.0000 mg | ORAL_TABLET | Freq: Every day | ORAL | Status: DC
  Administered 2019-04-19 – 2019-04-22 (×4): 500 mg via ORAL
  Filled 2019-04-19 (×5): qty 1

## 2019-04-19 NOTE — Progress Notes (Signed)
Physical Therapy Treatment Patient Details Name: Bridget Jackson MRN: [id number] DOB: November 18, 1950 Today's Date: 04/19/2019    History of Present Illness Pt is a 69 y.o. female admitted from [facility] on 04/16/19 with AMS and generalized weakness. Head CT negative for acute intracranial abnormality. Worked up for lithium toxicity, AKI and possible UTI. PMH includes schizoaffective disorder (on lithium), HTN, CVA (on Plavix), CKD III, arthritis, anxiety, depression; admission for lithium toxicity in 2020.    PT Comments    Pt with improved ambulation distance overall this session, but requires multiple seated rest breaks and mod verbal cuing for safety with gait and use of rollator during session. PT with some concerns about safety upon d/c home, as pt presently requires close guard for safety during mobility along with frequent cuing. PT recommending SNF, vs HHPT with 24/7 assist if family refuses. PT to continue to follow acutely.    Follow Up Recommendations  SNF;Supervision/Assistance - 24 hour     Equipment Recommendations  None recommended by PT    Recommendations for Other Services       Precautions / Restrictions Precautions Precautions: Fall;Other (comment) Restrictions Weight Bearing Restrictions: No    Mobility  Bed Mobility Overal bed mobility: Needs Assistance             General bed mobility comments: up in chair upon arrival to room, requests stay in chair upon PT exit.  Transfers Overall transfer level: Needs assistance Equipment used: Rolling walker (2 wheeled) Transfers: Sit to/from Stand Sit to Stand: Min assist         General transfer comment: Min assist for steadying pt and rollator, increased time to rise. Verbal cuing for hand placement.  Ambulation/Gait Ambulation/Gait assistance: Min guard Gait Distance (Feet): 100 Feet(50+60+100+10) Assistive device: 4-wheeled walker Gait Pattern/deviations: Step-through pattern;Decreased stride length;Trunk  flexed Gait velocity: decr   General Gait Details: Min guard for safety, with fatigue pt with quickening gait speed and heavy anterior leaning requiring verbal and tactile cuing from PT to correct. Multiple seated rest breaks to recover fatigue, requires assist to lock and unlock rollator, respectively, each time.   Stairs             Wheelchair Mobility    Modified Rankin (Stroke Patients Only)       Balance Overall balance assessment: Needs assistance Sitting-balance support: No upper extremity supported;Feet supported Sitting balance-Leahy Scale: Fair       Standing balance-Leahy Scale: Poor Standing balance comment: reliant on external support, especially dynamically                            Cognition Arousal/Alertness: Awake/alert Behavior During Therapy: WFL for tasks assessed/performed Overall Cognitive Status: History of cognitive impairments - at baseline                                 General Comments: pt follows one-step commands well, requires repeated verbal safety cues during mobility with use of rollator      Exercises      General Comments General comments (skin integrity, edema, etc.): HR <100 bpm throughout session      Pertinent Vitals/Pain Pain Assessment: No/denies pain Faces Pain Scale: No hurt Pain Intervention(s): Limited activity within patient's tolerance;Monitored during session    Home Living  Prior Function            PT Goals (current goals can now be found in the care plan section) Acute Rehab PT Goals Patient Stated Goal: Hopeful to return home with help from sister; agreeable to ST SNF if needed PT Goal Formulation: With patient/family Time For Goal Achievement: 05/02/19 Potential to Achieve Goals: Good Progress towards PT goals: Progressing toward goals    Frequency    Min 3X/week      PT Plan Current plan remains appropriate    Co-evaluation               AM-PAC PT "6 Clicks" Mobility   Outcome Measure  Help needed turning from your back to your side while in a flat bed without using bedrails?: A Little Help needed moving from lying on your back to sitting on the side of a flat bed without using bedrails?: A Little Help needed moving to and from a bed to a chair (including a wheelchair)?: A Little Help needed standing up from a chair using your arms (e.g., wheelchair or bedside chair)?: A Little Help needed to walk in hospital room?: A Little Help needed climbing 3-5 steps with a railing? : A Lot 6 Click Score: 17    End of Session Equipment Utilized During Treatment: Gait belt Activity Tolerance: Patient tolerated treatment well Patient left: in chair;with call bell/phone within reach;with family/visitor present(no chair alarm present, RN notified) Nurse Communication: Mobility status PT Visit Diagnosis: Other abnormalities of gait and mobility (R26.89);Muscle weakness (generalized) (M62.81)     Time: [id number] PT Time Calculation (min) (ACUTE ONLY): 20 min  Charges:  $Gait Training: 8-22 mins                    B[place]a La [place], PT [facility] Pager [phone]  Office [phone]    Claudius Mich D Elonda Husky 04/19/2019, 4:51 PM

## 2019-04-19 NOTE — Progress Notes (Signed)
Occupational Therapy Treatment Patient Details Name: Bridget Jackson MRN: [id number] DOB: 08-12-1950 Today's Date: 04/19/2019    History of present illness Pt is a 69 y.o. female admitted from [place] on 04/16/19 with AMS and generalized weakness. Head CT negative for acute intracranial abnormality. Worked up for lithium toxicity, AKI and possible UTI. PMH includes schizoaffective disorder (on lithium), HTN, CVA (on Plavix), CKD III, arthritis, anxiety, depression; admission for lithium toxicity in 2020.   OT comments  Updated d/c venue this date as pt lives alone and due to current physical and cognitive limitations pt is at an increased safety risk. Please see cognition section for cognitive limitations with memory and concentration impacting pt's ability to safely complete medication management and meal preparation. Pt required minA this date for stability and safe use of rollator with functional mobility. Pt required minA for more dynamic level balance activities requiring her to reach outside her base of support. Due to current level of functioning, recommend SNF level therapies to maximize pt's independence with ADL/IADL completion and functional mobility. She will need 24/7 physical assistance, if pt decides to d/c home with her sister, recommend [place].    Follow Up Recommendations  SNF;Supervision/Assistance - 24 hour    Equipment Recommendations  None recommended by OT    Recommendations for Other Services      Precautions / Restrictions Precautions Precautions: Fall;Other (comment) Precaution Comments: Bladder incontinence       Mobility Bed Mobility Overal bed mobility: Needs Assistance Bed Mobility: Supine to Sit     Supine to sit: Supervision;HOB elevated     General bed mobility comments: increased time and effort;cues to uncross legs prior to moving  Transfers Overall transfer level: Needs assistance Equipment used: Rolling walker (2 wheeled) Transfers: Sit to/from  Stand Sit to Stand: Min assist         General transfer comment: minA for stability and for safety;vc for safe hand placement and safe use of rollator    Balance Overall balance assessment: Needs assistance   Sitting balance-Leahy Scale: Fair Sitting balance - Comments: able to adjust socks sitting EOB     Standing balance-Leahy Scale: Poor Standing balance comment: Reliant on UE support; cues to look upright                           ADL either performed or assessed with clinical judgement   ADL Overall ADL's : Needs assistance/impaired     Grooming: Minimal assistance;Standing   Upper Body Bathing: Supervision/ safety;Sitting   Lower Body Bathing: Minimal assistance;Sit to/from stand   Upper Body Dressing : Supervision/safety   Lower Body Dressing: Minimal assistance;Sit to/from stand Lower Body Dressing Details (indicate cue type and reason): decreased access to bilateral feet Toilet Transfer: Minimal assistance;Ambulation Toilet Transfer Details (indicate cue type and reason): for stability and controlled desent onto chair;cues for safe use of rW Toileting- Clothing Manipulation and Hygiene: Minimal assistance       Functional mobility during ADLs: Minimal assistance General ADL Comments: minA level for ADL completion this date requiring increased time for all tasks;minguard for more dynamic level balance activities;mingaurd to supervision for safety      Vision       Perception     Praxis      Cognition Arousal/Alertness: Awake/alert Behavior During Therapy: WFL for tasks assessed/performed Overall Cognitive Status: History of cognitive impairments - at baseline  General Comments: per RN;pt at baseline cognition;Pt scored 17 on Short Blessed Test to assess memory and concentration, this score is indicative of "impairment consistent with dementia" (10 or more);pt required consistent cues for safe use  of rollator and safe hand placement with transfers;pt report completing her own medication management, pt unable to complete medication transfer screen despite max assistance from therapist        Exercises     Shoulder Instructions       General Comments vss    Pertinent Vitals/ Pain       Pain Assessment: No/denies pain  Home Living                                          Prior Functioning/Environment              Frequency  Min 2X/week        Progress Toward Goals  OT Goals(current goals can now be found in the care plan section)  Progress towards OT goals: Progressing toward goals  Acute Rehab OT Goals Patient Stated Goal: to go home OT Goal Formulation: With patient/family Time For Goal Achievement: 05/02/19 Potential to Achieve Goals: Good ADL Goals Pt Will Perform Lower Body Dressing: with modified independence;sitting/lateral leans;sit to/from stand Pt/caregiver will Perform Home Exercise Program: Increased strength;Both right and left upper extremity;With Supervision Additional ADL Goal #1: Pt will complete multi-step commands within ADL tasks with 90% accuracy in 2/2 trials to continue to assess safety with return home.  Plan Discharge plan needs to be updated    Co-evaluation                 AM-PAC OT "6 Clicks" Daily Activity     Outcome Measure   Help from another person eating meals?: None Help from another person taking care of personal grooming?: A Little Help from another person toileting, which includes using toliet, bedpan, or urinal?: A Little Help from another person bathing (including washing, rinsing, drying)?: A Little Help from another person to put on and taking off regular upper body clothing?: None Help from another person to put on and taking off regular lower body clothing?: A Little 6 Click Score: 20    End of Session Equipment Utilized During Treatment: Gait belt;Rolling walker  OT Visit  Diagnosis: Unsteadiness on feet (R26.81);Muscle weakness (generalized) (M62.81);Pain Pain - Right/Left: Right Pain - part of body: Hip   Activity Tolerance Patient tolerated treatment well   Patient Left in chair;with call bell/phone within reach   Nurse Communication Mobility status        Time: [id number] OT Time Calculation (min): 26 min  Charges: OT General Charges $OT Visit: 1 Visit OT Treatments $Self Care/Home Management : 8-22 mins $Cognitive Funtion inital: Initial 15 mins  Bridget Jackson OTR/L Acute Rehabilitation Services Office: [place] 04/19/2019, 12:31 PM
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
## 2019-04-19 NOTE — Progress Notes (Signed)
Progress Note    Bridget Jackson  [id number] DOB: Feb 17, 1950  DOA: 04/16/2019 PCP: Kathyrn Lass, MD    Brief Narrative:   Chief complaint: Acute encephalopathy  Medical records reviewed and are as summarized below:  Bridget Jackson is an 69 y.o. female with a past medical history significant for schizoaffective disorder, hypertension, anemia, anxiety, obstructive sleep apnea, CVA on Plavix, chronic kidney disease stage III presented to the emergency department March 15 chief complaint of acute encephalopathy.  Work-up revealed lithium toxicity in the setting of acute kidney injury superimposed on chronic kidney disease stage III and questionable UTI.  She was provided with IV fluids, Rocephin and lithium was held.  Encephalopathy resolved as lithium level dropped to within normal limits.  Creatinine trending down however sodium trending up  Assessment/Plan:   Principal Problem:   Acute metabolic encephalopathy Active Problems:   Lithium toxicity   Schizoaffective disorder (HCC)   AKI (acute kidney injury) ([facility])   Hypernatremia   Elevated TSH   UTI (urinary tract infection)  #1.  Acute metabolic encephalopathy likely related to lithium toxicity in the setting of acute kidney injury superimposed on chronic kidney disease stage III.  Currently at baseline but appears weak. CT of her head with no acute intracranial abnormalities. Lithium level 1.32 on presentation.  This morning lithium 0.63.  Evaluated by PT who recommends SNF evaluated by OT recommend home health. -Continue to hold lithium  #2.  Lithium toxicity.  Unclear why patient's lithium level worsened.  Evaluation reveals no new medications she manages her own medications with the help of her sister so not likely accidentally took too much. Query worsening renal function.  Her baseline creatinine appears to be 1.35-1.4.  Creatinine was 2.13 on presentation.  Patient had hospitalization prior for same.  Plan discussed with  patient's psych NP Thayer Headings and decision to stop lithium and start Depekote 500mg  po qhs was made.  -Continue to hold lithium for now -start Depekote tonight  3.  Acute kidney injury superimposed on chronic kidney disease stage III.  Baseline creatinine as noted above.  Etiology unclear.  Home medication list evaluated and no other nephrotoxins noted.  Urine output good.  Provided with gentle IV fluids initially.  Creatinine continues to  trend down. -Hold nephrotoxins -Monitor urine output -Recheck in the morning  #4.  Hypernatremia.  Sodium level continues to trend up this morning.   Sodium level 140 on admission and today it is 149.  IV fluids were stopped yesterday. -Nephrology consult -Urine sodium and osmolality per nephrology -Serum osmolality -monitor  #5.  Urinary tract infection.  Questionable.  Rocephin provided for 3 days. Culture recommends recollecting.  She is afebrile hemodynamically stable no leukocytosis no dysuria. - 3-day course -stop rocephin  #6.  Schizoaffective disorder.  Stable at baseline. -Outpatient follow-up -Continue Prolixin -[place]ote ER as noted above.   #7. Hx CVA.  Home medications include Plavix. -Continue Plavix  #8.  Elevated TSH.  TSH 9.2 on admission.  Free T4 within the limits of normal.  No history of hypothyroid.  -OP follow up    Family Communication/Anticipated D/C date and plan/Code Status   DVT prophylaxis: Lovenox ordered. Code Status: Full Code.  Family Communication: sister at bedside Disposition Plan: worsening hypernatremia requiring nephrology assistance. Plan to discharge back home with [facility] when ready   Medical Consultants:    [facility] nephrology   Anti-Infectives:    Rocephin 3/15-3/17  Subjective:   Awake alert denies any pain or discomfort.  Does complain of feeling "generally weak"  Objective:    Vitals:   04/18/19 1342 04/18/19 1756 04/18/19 2336 04/19/19 0513  BP: 105/60 (!) 105/58 129/69  135/67  Pulse: (!) 57 69 66 60  Resp: 16 16 17 15   Temp: (!) 97.5 F (36.4 C) (!) 97.5 F (36.4 C) 97.7 F (36.5 C) 97.7 F (36.5 C)  TempSrc: Oral Oral Oral Oral  SpO2: 99% 97% 97% 96%    Intake/Output Summary (Last 24 hours) at 04/19/2019 1214 Last data filed at 04/19/2019 0830 Gross per 24 hour  Intake 1499 ml  Output 3000 ml  Net -1501 ml   There were no vitals filed for this visit.  Exam: Gen: Well-nourished, cooperative no acute distress CV regular rate and rhythm no murmur gallop or rub no lower extremity edema Respiratory: No increased work of breathing breath sounds clear bilaterally with good air movement I hear no rhonchi no wheezes Abdomen: Obese soft positive bowel sounds throughout nontender to palpation no guarding or rebounding Musculoskeletal: Joints without swelling/erythema full range of motion Neuro: Awake alert oriented x3 speech is slow but clear facial symmetry cranial nerves II through XII grossly intact  Data Reviewed:   I have personally reviewed following labs and imaging studies:  Labs: Labs show the following:   Basic Metabolic Panel: Recent Labs  Lab 04/17/19 0015 04/17/19 0015 04/17/19 0315 04/17/19 0315 04/17/19 [id number] 04/17/19 0822 04/18/19 0503 04/19/19 0258  NA 140  --  140  --  142  --  147* 149*  K 4.3   < > 4.0   < > 4.3   < > 4.0 4.1  CL 108  --  113*  --  111  --  119* 120*  CO2 21*  --  18*  --  20*  --  19* 20*  GLUCOSE 98  --  108*  --  99  --  96 100*  BUN 31*  --  28*  --  26*  --  20 19  CREATININE 2.00*  --  1.79*  --  1.71*  --  1.48* 1.45*  CALCIUM 10.3  --  9.3  --  9.0  --  9.1 9.5   < > = values in this interval not displayed.   GFR CrCl cannot be calculated (Unknown ideal weight.). Liver Function Tests: Recent Labs  Lab 04/16/19 2107  AST 19  ALT 27  ALKPHOS 95  BILITOT 0.5  PROT 6.6  ALBUMIN 4.1   No results for input(s): LIPASE, AMYLASE in the last 168 hours. No results for input(s): AMMONIA in  the last 168 hours. Coagulation profile No results for input(s): INR, PROTIME in the last 168 hours.  CBC: Recent Labs  Lab 04/16/19 2107 04/18/19 0503  WBC 9.1 6.5  HGB 12.4 11.3*  HCT 38.8 37.0  MCV 99.5 103.6*  PLT 238 185   Cardiac Enzymes: No results for input(s): CKTOTAL, CKMB, CKMBINDEX, TROPONINI in the last 168 hours. BNP (last 3 results) No results for input(s): PROBNP in the last 8760 hours. CBG: Recent Labs  Lab 04/17/19 0012  GLUCAP 90   D-Dimer: No results for input(s): DDIMER in the last 72 hours. Hgb A1c: No results for input(s): HGBA1C in the last 72 hours. Lipid Profile: No results for input(s): CHOL, HDL, LDLCALC, TRIG, CHOLHDL, LDLDIRECT in the last 72 hours. Thyroid function studies: Recent Labs    04/17/19 0315  TSH 9.217*   Anemia work up: No results for input(s): VITAMINB12, FOLATE, FERRITIN,  TIBC, IRON, RETICCTPCT in the last 72 hours. Sepsis Labs: Recent Labs  Lab 04/16/19 2107 04/18/19 0503  WBC 9.1 6.5    Microbiology Recent Results (from the past 240 hour(s))  Urine culture     Status: Abnormal   Collection Time: 04/16/19 11:22 PM   Specimen: Urine, Random  Result Value Ref Range Status   Specimen Description URINE, RANDOM  Final   Special Requests   Final    NONE Performed at [facility], [address] [place], [place] [address]    Culture MULTIPLE SPECIES PRESENT, SUGGEST RECOLLECTION (A)  Final   Report Status 04/18/2019 FINAL  Final  Respiratory Panel by RT PCR (Flu A&B, Covid) - Nasopharyngeal Swab     Status: None   Collection Time: 04/17/19 12:27 AM   Specimen: Nasopharyngeal Swab  Result Value Ref Range Status   SARS Coronavirus 2 by RT PCR NEGATIVE NEGATIVE Final    Comment: (NOTE) SARS-CoV-2 target nucleic acids are NOT DETECTED. The SARS-CoV-2 RNA is generally detectable in upper respiratoy specimens during the acute phase of infection. The lowest concentration of SARS-CoV-2 viral copies this assay  can detect is 131 copies/mL. A negative result does not preclude SARS-Cov-2 infection and should not be used as the sole basis for treatment or other patient management decisions. A negative result may occur with  improper specimen collection/handling, submission of specimen other than nasopharyngeal swab, presence of viral mutation(s) within the areas targeted by this assay, and inadequate number of viral copies (<131 copies/mL). A negative result must be combined with clinical observations, patient history, and epidemiological information. The expected result is Negative. Fact Sheet for Patients:  PinkCheek.be Fact Sheet for Healthcare Providers:  GravelBags.it This test is not yet ap proved or cleared by the [place] FDA and  has been authorized for detection and/or diagnosis of SARS-CoV-2 by FDA under an Emergency Use Authorization (EUA). This EUA will remain  in effect (meaning this test can be used) for the duration of the COVID-19 declaration under Section 564(b)(1) of the Act, 21 U.S.C. section 360bbb-3(b)(1), unless the authorization is terminated or revoked sooner.    Influenza A by PCR NEGATIVE NEGATIVE Final   Influenza B by PCR NEGATIVE NEGATIVE Final    Comment: (NOTE) The Xpert Xpress SARS-CoV-2/FLU/RSV assay is intended as an aid in  the diagnosis of influenza from Nasopharyngeal swab specimens and  should not be used as a sole basis for treatment. Nasal washings and  aspirates are unacceptable for Xpert Xpress SARS-CoV-2/FLU/RSV  testing. Fact Sheet for Patients: PinkCheek.be Fact Sheet for Healthcare Providers: GravelBags.it This test is not yet approved or cleared by the [place] FDA and  has been authorized for detection and/or diagnosis of SARS-CoV-2 by  FDA under an Emergency Use Authorization (EUA). This EUA will remain  in effect  (meaning this test can be used) for the duration of the  Covid-19 declaration under Section 564(b)(1) of the Act, 21  U.S.C. section 360bbb-3(b)(1), unless the authorization is  terminated or revoked. Performed at [facility], [place] [address], [place], [place] [address]     Procedures and diagnostic studies:  No results found.  Medications:   . clopidogrel  75 mg Oral Daily  . ferrous sulfate  325 mg Oral Q breakfast  . fluPHENAZine  4 mg Oral QHS  . heparin  5,000 Units Subcutaneous Q8H  . multivitamin with minerals  1 tablet Oral Daily  . sodium chloride flush  3 mL Intravenous  Q12H   Continuous Infusions:   LOS: 2 days   Radene Gunning NP  [facility]   How to contact the [facility] Attending or Consulting provider [place] or covering provider during after hours [place], for this patient?  1. Check the care team in [facility] and look for a) attending/consulting TRH provider listed and b) the [facility] team listed 2. Log into [email or web address] and use [place]'s universal password to access. If you do not have the password, please contact the hospital operator. 3. Locate the [facility] provider you are looking for under [facility] and page to a number that you can be directly reached. 4. If you still have difficulty reaching the provider, please page the [facility] (Director on Call) for the Hospitalists listed on amion for assistance.  04/19/2019, 12:14 PM

## 2019-04-20 DIAGNOSIS — N251 Nephrogenic diabetes insipidus: Secondary | ICD-10-CM

## 2019-04-20 LAB — COMPREHENSIVE METABOLIC PANEL
ALT: 27 U/L (ref 0–44)
AST: 24 U/L (ref 15–41)
Albumin: 3.5 g/dL (ref 3.5–5.0)
Alkaline Phosphatase: 88 U/L (ref 38–126)
Anion gap: 10 (ref 5–15)
BUN: 20 mg/dL (ref 8–23)
CO2: 20 mmol/L — ABNORMAL LOW (ref 22–32)
Calcium: 9.3 mg/dL (ref 8.9–10.3)
Chloride: 117 mmol/L — ABNORMAL HIGH (ref 98–111)
Creatinine, Ser: 1.59 mg/dL — ABNORMAL HIGH (ref 0.44–1.00)
GFR calc Af Amer: 38 mL/min — ABNORMAL LOW (ref >60)
GFR calc non Af Amer: 33 mL/min — ABNORMAL LOW (ref >60)
Glucose, Bld: 121 mg/dL — ABNORMAL HIGH (ref 70–99)
Potassium: 4.4 mmol/L (ref 3.5–5.1)
Sodium: 147 mmol/L — ABNORMAL HIGH (ref 135–145)
Total Bilirubin: 0.8 mg/dL (ref 0.3–1.2)
Total Protein: 6.2 g/dL — ABNORMAL LOW (ref 6.5–8.1)

## 2019-04-20 MED ORDER — DEXTROSE 5 % IV SOLN: INTRAVENOUS | Status: AC

## 2019-04-20 NOTE — Progress Notes (Signed)
Progress Note    Bridget Jackson  [id number] DOB: 1950/11/26  DOA: 04/16/2019 PCP: Kathyrn Lass, MD    Brief Narrative:   Chief complaint: Acute encephalopathy/hypernatremia  Medical records reviewed and are as summarized below:  Bridget Jackson is an 69 y.o. female past medical history significant for schizoaffective disorder, hypertension, anemia, anxiety, obstructive sleep apnea, CVA on Plavix, chronic kidney disease stage III presented to the emergency department March 13 chief complaint of acute encephalopathy.  Work-up revealed lithium toxicity in the setting of acute kidney injury superimposed on chronic kidney disease and questionable UTI.  Lithium was withheld she was hydrated and provided with 3 days of Rocephin.  Hospitalization complicated by development of hypernatremia.  Assessment/Plan:   Principal Problem:   Acute metabolic encephalopathy Active Problems:   Lithium toxicity   Schizoaffective disorder (HCC)   AKI (acute kidney injury) (HCC)   Hypernatremia   Elevated TSH   UTI (urinary tract infection)   #1.  Acute metabolic encephalopathy related to lithium toxicity in the setting of acute kidney injury superimposed on chronic kidney disease stage III. Resolved as lithium discontinued.  #2.  Acute kidney injury superimposed on chronic kidney disease stage III.  Slight improvement in creatinine today.  Evaluated by nephrology who opine that current creatinine is likely close to her baseline.  #3.  Hypernatremia.  Sodium level trending down slightly.  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  Serum osmolality 312, urine osmolality 221, urine sodium 76.  Urine output yesterday 2.7 L.  P.o. intake good. -Continue D5W per nephrology -Defer further management to nephrology -Monitor  4.  Schizoaffective disorder.  Stable at baseline.  Patient and sister report a "rough night".   Sister describes increased snoring and intermittent agitation.  Lithium stopped and Depakote started. -Continue Prolixin -Monitor for effects of Depakote  #5.  Urinary tract infection.  She was provided with Rocephin for 3 days.  Culture recommended recollecting.  She remained afebrile nontoxic-appearing and asymptomatic.  #6.  History of CVA.  Home medications include Plavix -Continue Plavix -encourage mobilization  #7 elevated TSH.  Free T4 within the limits of normal. -Outpatient follow-up  #8.  Lithium toxicity.  Resolved.  Unclear why patient's lithium level elevated.  Case discussed with patient's primary psych NP Thayer Headings recommended stopping lithium and starting Depakote 500 mg p.o. nightly. Depakote started last night -Continue Depakote    Family Communication/Anticipated D/C date and plan/Code Status   DVT prophylaxis: Heparin Code Status: Full Code.  Family Communication: sister at bedside Disposition Plan: home when sodium level corrected   Medical Consultants:    [facility] nephrology   Anti-Infectives:    None  Subjective:   Awake alert.  Reports "restless" night last night.  Denies any pain or discomfort.  Ports a good appetite  Objective:    Vitals:   04/19/19 1235 04/19/19 1825 04/19/19 2258 04/20/19 0546  BP: (!) 111/55 (!) 115/50 127/64 109/70  Pulse: 66 72 65 67  Resp: 16 18    Temp: 97.7 F (36.5 C) 98.1 F (36.7 C) 97.8 F (36.6 C) 98.3 F (36.8 C)  TempSrc: Oral Oral Oral Oral  SpO2: 98% 98% 98% 97%    Intake/Output Summary (Last 24 hours) at 04/20/2019 0843 Last data filed at 04/20/2019 0546 Gross per 24 hour  Intake 675.56 ml  Output 2750 ml  Net -2074.44 ml   There were no vitals filed for this visit.  Exam: General: Well-nourished cooperative no acute distress CV: Regular rate and rhythm I hear no murmur gallop or rub no lower extremity edema her hands appear less puffy as well Respiratory: No increased work of breathing  breath sounds are distant but clear I hear no wheezes no crackles Abdomen: Obese soft positive bowel sounds throughout nontender to palpation no mass organomegaly noted Musculoskeletal: Joints without swelling/erythema full range of motion  Neuro: Awake alert oriented x3 speech slow but clear cranial nerves II through XII grossly intact   Data Reviewed:   I have personally reviewed following labs and imaging studies:  Labs: Labs show the following:   Basic Metabolic Panel: Recent Labs  Lab 04/17/19 0315 04/17/19 0315 04/17/19 [id number] 04/17/19 0822 04/18/19 0503 04/18/19 0503 04/19/19 0258 04/20/19 0546  NA 140  --  142  --  147*  --  149* 147*  K 4.0   < > 4.3   < > 4.0   < > 4.1 4.4  CL 113*  --  111  --  119*  --  120* 117*  CO2 18*  --  20*  --  19*  --  20* 20*  GLUCOSE 108*  --  99  --  96  --  100* 121*  BUN 28*  --  26*  --  20  --  19 20  CREATININE 1.79*  --  1.71*  --  1.48*  --  1.45* 1.59*  CALCIUM 9.3  --  9.0  --  9.1  --  9.5 9.3   < > = values in this interval not displayed.   GFR CrCl cannot be calculated (Unknown ideal weight.). Liver Function Tests: Recent Labs  Lab 04/16/19 2107 04/20/19 0546  AST 19 24  ALT 27 27  ALKPHOS 95 88  BILITOT 0.5 0.8  PROT 6.6 6.2*  ALBUMIN 4.1 3.5   No results for input(s): LIPASE, AMYLASE in the last 168 hours. No results for input(s): AMMONIA in the last 168 hours. Coagulation profile No results for input(s): INR, PROTIME in the last 168 hours.  CBC: Recent Labs  Lab 04/16/19 2107 04/18/19 0503  WBC 9.1 6.5  HGB 12.4 11.3*  HCT 38.8 37.0  MCV 99.5 103.6*  PLT 238 185   Cardiac Enzymes: No results for input(s): CKTOTAL, CKMB, CKMBINDEX, TROPONINI in the last 168 hours. BNP (last 3 results) No results for input(s): PROBNP in the last 8760 hours. CBG: Recent Labs  Lab 04/17/19 0012  GLUCAP 90   D-Dimer: No results for input(s): DDIMER in the last 72 hours. Hgb A1c: No results for input(s):  HGBA1C in the last 72 hours. Lipid Profile: No results for input(s): CHOL, HDL, LDLCALC, TRIG, CHOLHDL, LDLDIRECT in the last 72 hours. Thyroid function studies: No results for input(s): TSH, T4TOTAL, T3FREE, THYROIDAB in the last 72 hours.  Invalid input(s): FREET3 Anemia work up: No results for input(s): VITAMINB12, FOLATE, FERRITIN, TIBC, IRON, RETICCTPCT in the last 72 hours. Sepsis Labs: Recent Labs  Lab 04/16/19 2107 04/18/19 0503  WBC 9.1 6.5    Microbiology Recent Results (from the past 240 hour(s))  Urine culture     Status: Abnormal   Collection Time: 04/16/19 11:22 PM   Specimen: Urine, Random  Result Value Ref Range Status   Specimen Description URINE, RANDOM  Final   Special Requests   Final    NONE Performed at [facility], [address], [place], [place] [address]    Culture MULTIPLE SPECIES PRESENT,  SUGGEST RECOLLECTION (A)  Final   Report Status 04/18/2019 FINAL  Final  Respiratory Panel by RT PCR (Flu A&B, Covid) - Nasopharyngeal Swab     Status: None   Collection Time: 04/17/19 12:27 AM   Specimen: Nasopharyngeal Swab  Result Value Ref Range Status   SARS Coronavirus 2 by RT PCR NEGATIVE NEGATIVE Final    Comment: (NOTE) SARS-CoV-2 target nucleic acids are NOT DETECTED. The SARS-CoV-2 RNA is generally detectable in upper respiratoy specimens during the acute phase of infection. The lowest concentration of SARS-CoV-2 viral copies this assay can detect is 131 copies/mL. A negative result does not preclude SARS-Cov-2 infection and should not be used as the sole basis for treatment or other patient management decisions. A negative result may occur with  improper specimen collection/handling, submission of specimen other than nasopharyngeal swab, presence of viral mutation(s) within the areas targeted by this assay, and inadequate number of viral copies (<131 copies/mL). A negative result must be combined with clinical observations, patient  history, and epidemiological information. The expected result is Negative. Fact Sheet for Patients:  PinkCheek.be Fact Sheet for Healthcare Providers:  GravelBags.it This test is not yet ap proved or cleared by the [place] FDA and  has been authorized for detection and/or diagnosis of SARS-CoV-2 by FDA under an Emergency Use Authorization (EUA). This EUA will remain  in effect (meaning this test can be used) for the duration of the COVID-19 declaration under Section 564(b)(1) of the Act, 21 U.S.C. section 360bbb-3(b)(1), unless the authorization is terminated or revoked sooner.    Influenza A by PCR NEGATIVE NEGATIVE Final   Influenza B by PCR NEGATIVE NEGATIVE Final    Comment: (NOTE) The Xpert Xpress SARS-CoV-2/FLU/RSV assay is intended as an aid in  the diagnosis of influenza from Nasopharyngeal swab specimens and  should not be used as a sole basis for treatment. Nasal washings and  aspirates are unacceptable for Xpert Xpress SARS-CoV-2/FLU/RSV  testing. Fact Sheet for Patients: PinkCheek.be Fact Sheet for Healthcare Providers: GravelBags.it This test is not yet approved or cleared by the [place] FDA and  has been authorized for detection and/or diagnosis of SARS-CoV-2 by  FDA under an Emergency Use Authorization (EUA). This EUA will remain  in effect (meaning this test can be used) for the duration of the  Covid-19 declaration under Section 564(b)(1) of the Act, 21  U.S.C. section 360bbb-3(b)(1), unless the authorization is  terminated or revoked. Performed at [facility], [place] [address], [place], [place] [address]     Procedures and diagnostic studies:  No results found.  Medications:   . clopidogrel  75 mg Oral Daily  . divalproex  500 mg Oral QHS  . ferrous sulfate  325 mg Oral Q breakfast  . fluPHENAZine  4 mg Oral QHS  . heparin   5,000 Units Subcutaneous Q8H  . multivitamin with minerals  1 tablet Oral Daily  . sodium chloride flush  3 mL Intravenous Q12H   Continuous Infusions: . dextrose 100 mL/hr (04/19/19 1348)     LOS: 3 days   Radene Gunning NP  [facility]   How to contact the [facility] Attending or Consulting provider [place] or covering provider during after hours [place], for this patient?  1. Check the care team in [facility] and look for a) attending/consulting TRH provider listed and b) the [facility] team listed 2. Log into [email or web address] and use [place]'s universal password to access. If you do not have the  password, please contact the hospital operator. 3. Locate the [facility] provider you are looking for under [facility] and page to a number that you can be directly reached. 4. If you still have difficulty reaching the provider, please page the [facility] (Director on Call) for the Hospitalists listed on amion for assistance.  04/20/2019, 8:43 AM

## 2019-04-20 NOTE — Plan of Care (Signed)
Plan of care reviewed with pt at bedside. Sister at bedside. Pt up with assistance and walker, ambulated hall with RN. Up in chair for 6hrs per request. Purewick in place. Pt stable at this time, call bell in reach, will continue to monitor.  Problem: Education: Goal: Knowledge of General Education information will improve Description: Including pain rating scale, medication(s)/side effects and non-pharmacologic comfort measures 04/20/2019 1531 by Marty Heck, RN Outcome: Progressing 04/20/2019 1531 by Marty Heck, RN Outcome: Progressing 04/20/2019 1530 by Marty Heck, RN Outcome: Progressing   Problem: Education: Goal: Knowledge of General Education information will improve Description: Including pain rating scale, medication(s)/side effects and non-pharmacologic comfort measures Outcome: Progressing   Problem: Health Behavior/Discharge Planning: Goal: Ability to manage health-related needs will improve Outcome: Progressing   Problem: Clinical Measurements: Goal: Ability to maintain clinical measurements within normal limits will improve Outcome: Progressing Goal: Will remain free from infection Outcome: Progressing Goal: Diagnostic test results will improve Outcome: Progressing Goal: Respiratory complications will improve Outcome: Progressing Goal: Cardiovascular complication will be avoided Outcome: Progressing   Problem: Activity: Goal: Risk for activity intolerance will decrease Outcome: Progressing   Problem: Nutrition: Goal: Adequate nutrition will be maintained Outcome: Progressing   Problem: Coping: Goal: Level of anxiety will decrease Outcome: Progressing   Problem: Elimination: Goal: Will not experience complications related to bowel motility Outcome: Progressing Goal: Will not experience complications related to urinary retention Outcome: Progressing   Problem: Pain Managment: Goal: General experience of comfort will improve Outcome:  Progressing   Problem: Safety: Goal: Ability to remain free from injury will improve Outcome: Progressing   Problem: Skin Integrity: Goal: Risk for impaired skin integrity will decrease Outcome: Progressing

## 2019-04-20 NOTE — Progress Notes (Signed)
  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  S/p hydration with normal saline - s/p D5W at 100 ml/hr x 24 hours, will order for one more day  - will restart amiloride 5 mg daily--> discussed with pt's sister that we need to get her buy in and plan for the medication in the setting of paranoid ideations she can have--> will attempt to start it tomorrow after discussion with pt and sister together in room  # Lithium toxicity  - Patient's level supratherapeutic and note repeat levels off lithium are improving; shizoaffective disorder management per primary team   # AMS  - setting of elevated lithium level, now normal - resolved per patient    # Schizoaffective disorder - Per primary team.  They have spoken with her outpatient psych provider who recommended stopping lithium and switching to depakote   # CKD stage IIIb - AKI has improved to nearing the patient's apparent baseline of 1.5.    # Hypertension - Controlled   Subjective:    Seen in room.  Sitting up eating lunch.  Updated sister over the phone.   Objective:   BP 109/70 (BP Location: Right Arm)   Pulse 67   Temp 98.3 F (36.8 C) (Oral)   Resp 18   SpO2 97%   Intake/Output Summary (Last 24 hours) at 04/20/2019 1227 Last data filed at 04/20/2019 [id number] Gross per 24 hour  Intake 2056.82 ml  Output 2650 ml  Net -593.18 ml   Weight change:   Physical Exam: Gen: NAD sitting up CVS: RRR Resp: clear bilaterally Abd: soft, nontender Ext: no LE edema  Imaging: No results found.  Labs: BMET Recent Labs  Lab 04/16/19 2107 04/17/19 0015 04/17/19 0315 04/17/19 [id number] 04/18/19 0503 04/19/19 0258 04/20/19 0546  NA 140 140 140 142 147* 149* 147*  K 4.1 4.3 4.0 4.3 4.0 4.1 4.4  CL 109 108 113* 111 119* 120* 117*  CO2 22 21* 18* 20* 19* 20* 20*  GLUCOSE 108* 98 108* 99 96 100* 121*  BUN  29* 31* 28* 26* 20 19 20   CREATININE 2.13* 2.00* 1.79* 1.71* 1.48* 1.45* 1.59*  CALCIUM 10.1 10.3 9.3 9.0 9.1 9.5 9.3   CBC Recent Labs  Lab 04/16/19 2107 04/18/19 0503  WBC 9.1 6.5  HGB 12.4 11.3*  HCT 38.8 37.0  MCV 99.5 103.6*  PLT 238 185    Medications:    . clopidogrel  75 mg Oral Daily  . divalproex  500 mg Oral QHS  . ferrous sulfate  325 mg Oral Q breakfast  . fluPHENAZine  4 mg Oral QHS  . heparin  5,000 Units Subcutaneous Q8H  . multivitamin with minerals  1 tablet Oral Daily  . sodium chloride flush  3 mL Intravenous Q12H      [name] MD 04/20/2019, 12:27 PM

## 2019-04-21 DIAGNOSIS — E232 Diabetes insipidus: Secondary | ICD-10-CM

## 2019-04-21 LAB — BASIC METABOLIC PANEL
Anion gap: 9 (ref 5–15)
BUN: 23 mg/dL (ref 8–23)
CO2: 22 mmol/L (ref 22–32)
Calcium: 9.4 mg/dL (ref 8.9–10.3)
Chloride: 115 mmol/L — ABNORMAL HIGH (ref 98–111)
Creatinine, Ser: 1.47 mg/dL — ABNORMAL HIGH (ref 0.44–1.00)
GFR calc Af Amer: 42 mL/min — ABNORMAL LOW (ref >60)
GFR calc non Af Amer: 36 mL/min — ABNORMAL LOW (ref >60)
Glucose, Bld: 114 mg/dL — ABNORMAL HIGH (ref 70–99)
Potassium: 4.5 mmol/L (ref 3.5–5.1)
Sodium: 146 mmol/L — ABNORMAL HIGH (ref 135–145)

## 2019-04-21 MED ORDER — TRAZODONE HCL 50 MG PO TABS
50.0000 mg | ORAL_TABLET | Freq: Every day | ORAL | Status: DC
  Administered 2019-04-21 – 2019-04-22 (×2): 50 mg via ORAL
  Filled 2019-04-21 (×2): qty 1

## 2019-04-21 MED ORDER — AMILORIDE HCL 5 MG PO TABS
5.0000 mg | ORAL_TABLET | Freq: Every day | ORAL | Status: DC
  Administered 2019-04-21 – 2019-04-23 (×3): 5 mg via ORAL
  Filled 2019-04-21 (×3): qty 1

## 2019-04-21 NOTE — Plan of Care (Signed)
Patient more awake and alert today and engaged in care.

## 2019-04-21 NOTE — Progress Notes (Signed)
Progress Note    Bridget Jackson  [id number] DOB: 1951-01-31  DOA: 04/16/2019 PCP: Kathyrn Lass, MD    Brief Narrative:   Chief complaint: Acute encephalopathy/hyponatremia.  Medical records reviewed and are as summarized below:  Bridget Jackson is very pleasant 69 y.o. female with a past medical history significant for schizoaffective disorder, anemia, hypertension, anxiety, obstructive sleep apnea, CVA on Plavix, chronic kidney disease stage III, presented to emergency department March 13 chief complaint of acute encephalopathy.  Work-up revealed lithium toxicity in the setting of acute kidney injury superimposed on chronic kidney disease and questionable UTI.  Lithium was was held and she was hydrated with IV fluids and lithium level quickly resolved.  She was provided with 3 days of Rocephin.  Hospitalization complicated by development of hypernatremia.  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  Assessment/Plan:   Principal Problem:   Acute metabolic encephalopathy Active Problems:   Lithium toxicity   Schizoaffective disorder (HCC)   AKI (acute kidney injury) (HCC)   Hypernatremia   Elevated TSH   UTI (urinary tract infection)  #1.  Acute metabolic encephalopathy related to lithium toxicity in the setting of acute kidney injury superimposed on chronic kidney disease stage III. Resolved. lithium discontinued.  #2.  Acute kidney injury superimposed on chronic kidney disease stage III.    Creatinine continues to trend down slightly. Evaluated by nephrology who opine that current creatinine is likely close to her baseline. -Monitor  #3.  Hypernatremia.  Sodium level trending down only slightly.  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  Serum osmolality 312, urine osmolality 221, urine sodium 76.  Urine output yesterday  3.8L.  P.o. intake good. -IV fluids per nephrology -amiloride started today per nephrology -Defer further management to nephrology -Monitor  4.  Schizoaffective disorder.  Stable at baseline. Lithium stopped and Depakote started. -Continue Prolixin -Monitor for effects of Depakote  #5.  Urinary tract infection.  She was provided with Rocephin for 3 days.  Culture recommended recollecting.  She remained afebrile nontoxic-appearing and asymptomatic.  #6.  History of CVA.  Home medications include Plavix -Continue Plavix -encourage mobilization  #7 elevated TSH.  Free T4 within the limits of normal. -Outpatient follow-up  #8.  Lithium toxicity.  Resolved.  Unclear why patient's lithium level elevated.  Case discussed with patient's primary psych NP Thayer Headings recommended stopping lithium and starting Depakote 500 mg p.o. nightly. Depakote started last night -Continue Depakote      Family Communication/Anticipated D/C date and plan/Code Status   DVT prophylaxis: Lovenox ordered. Code Status: Full Code.  Family Communication: sister at bedside Disposition Plan: home with [facility] hopefully 24-48 hours. Continues with hypernatremia. Nephrology on board   Medical Consultants:    Dr Hollie Salk nephrologh   Anti-Infectives:    rocpehin completed 3 day course  Subjective:   Sitting up in chair sitting with sister.  Denies any pain or discomfort.  Objective:    Vitals:   04/20/19 1800 04/20/19 2018 04/21/19 0503 04/21/19 1205  BP: 104/63 114/65 115/75 119/67  Pulse: 74 73 69 78  Resp: 18 16 18 18   Temp: 98.9 F (37.2 C) 98.1 F (36.7 C) 98.5 F (36.9 C) 97.9 F (36.6 C)  TempSrc: Oral   Oral  SpO2: 97% 98% 96% 97%    Intake/Output Summary (Last 24 hours) at 04/21/2019 1546 Last data filed at 04/21/2019 1026 Gross  per 24 hour  Intake 253 ml  Output 3800 ml  Net -3547 ml   There were no vitals filed for this visit.  Exam: General: Awake alert no acute  distress CV: Regular rate and rhythm no murmur gallop or rub no lower extremity edema decreased edema in her upper extremities as well Respiratory: No increased work of breathing breath sounds are clear bilaterally I hear no wheeze no rhonchi Abdomen: Obese soft positive bowel sounds throughout no guarding or rebounding nontender to palpation Musculoskeletal: Joints without swelling/erythema upper extremities less puffy Neuro: Awake alert oriented x3 speech slow but clear bilateral grip 5 out of 5 lower extremity strength 5 out of 5 bilaterally  Data Reviewed:   I have personally reviewed following labs and imaging studies:  Labs: Labs show the following:   Basic Metabolic Panel: Recent Labs  Lab 04/17/19 0822 04/17/19 0822 04/18/19 0503 04/18/19 0503 04/19/19 0258 04/19/19 0258 04/20/19 0546 04/21/19 0842  NA 142  --  147*  --  149*  --  147* 146*  K 4.3   < > 4.0   < > 4.1   < > 4.4 4.5  CL 111  --  119*  --  120*  --  117* 115*  CO2 20*  --  19*  --  20*  --  20* 22  GLUCOSE 99  --  96  --  100*  --  121* 114*  BUN 26*  --  20  --  19  --  20 23  CREATININE 1.71*  --  1.48*  --  1.45*  --  1.59* 1.47*  CALCIUM 9.0  --  9.1  --  9.5  --  9.3 9.4   < > = values in this interval not displayed.   GFR CrCl cannot be calculated (Unknown ideal weight.). Liver Function Tests: Recent Labs  Lab 04/16/19 2107 04/20/19 0546  AST 19 24  ALT 27 27  ALKPHOS 95 88  BILITOT 0.5 0.8  PROT 6.6 6.2*  ALBUMIN 4.1 3.5   No results for input(s): LIPASE, AMYLASE in the last 168 hours. No results for input(s): AMMONIA in the last 168 hours. Coagulation profile No results for input(s): INR, PROTIME in the last 168 hours.  CBC: Recent Labs  Lab 04/16/19 2107 04/18/19 0503  WBC 9.1 6.5  HGB 12.4 11.3*  HCT 38.8 37.0  MCV 99.5 103.6*  PLT 238 185   Cardiac Enzymes: No results for input(s): CKTOTAL, CKMB, CKMBINDEX, TROPONINI in the last 168 hours. BNP (last 3 results) No  results for input(s): PROBNP in the last 8760 hours. CBG: Recent Labs  Lab 04/17/19 0012  GLUCAP 90   D-Dimer: No results for input(s): DDIMER in the last 72 hours. Hgb A1c: No results for input(s): HGBA1C in the last 72 hours. Lipid Profile: No results for input(s): CHOL, HDL, LDLCALC, TRIG, CHOLHDL, LDLDIRECT in the last 72 hours. Thyroid function studies: No results for input(s): TSH, T4TOTAL, T3FREE, THYROIDAB in the last 72 hours.  Invalid input(s): FREET3 Anemia work up: No results for input(s): VITAMINB12, FOLATE, FERRITIN, TIBC, IRON, RETICCTPCT in the last 72 hours. Sepsis Labs: Recent Labs  Lab 04/16/19 2107 04/18/19 0503  WBC 9.1 6.5    Microbiology Recent Results (from the past 240 hour(s))  Urine culture     Status: Abnormal   Collection Time: 04/16/19 11:22 PM   Specimen: Urine, Random  Result Value Ref Range Status   Specimen Description URINE, RANDOM  Final  Special Requests   Final    NONE Performed at [facility], [place] [address], [place], [place] [address]    Culture MULTIPLE SPECIES PRESENT, SUGGEST RECOLLECTION (A)  Final   Report Status 04/18/2019 FINAL  Final  Respiratory Panel by RT PCR (Flu A&B, Covid) - Nasopharyngeal Swab     Status: None   Collection Time: 04/17/19 12:27 AM   Specimen: Nasopharyngeal Swab  Result Value Ref Range Status   SARS Coronavirus 2 by RT PCR NEGATIVE NEGATIVE Final    Comment: (NOTE) SARS-CoV-2 target nucleic acids are NOT DETECTED. The SARS-CoV-2 RNA is generally detectable in upper respiratoy specimens during the acute phase of infection. The lowest concentration of SARS-CoV-2 viral copies this assay can detect is 131 copies/mL. A negative result does not preclude SARS-Cov-2 infection and should not be used as the sole basis for treatment or other patient management decisions. A negative result may occur with  improper specimen collection/handling, submission of specimen other than nasopharyngeal swab,  presence of viral mutation(s) within the areas targeted by this assay, and inadequate number of viral copies (<131 copies/mL). A negative result must be combined with clinical observations, patient history, and epidemiological information. The expected result is Negative. Fact Sheet for Patients:  PinkCheek.be Fact Sheet for Healthcare Providers:  GravelBags.it This test is not yet ap proved or cleared by the [place] FDA and  has been authorized for detection and/or diagnosis of SARS-CoV-2 by FDA under an Emergency Use Authorization (EUA). This EUA will remain  in effect (meaning this test can be used) for the duration of the COVID-19 declaration under Section 564(b)(1) of the Act, 21 U.S.C. section 360bbb-3(b)(1), unless the authorization is terminated or revoked sooner.    Influenza A by PCR NEGATIVE NEGATIVE Final   Influenza B by PCR NEGATIVE NEGATIVE Final    Comment: (NOTE) The Xpert Xpress SARS-CoV-2/FLU/RSV assay is intended as an aid in  the diagnosis of influenza from Nasopharyngeal swab specimens and  should not be used as a sole basis for treatment. Nasal washings and  aspirates are unacceptable for Xpert Xpress SARS-CoV-2/FLU/RSV  testing. Fact Sheet for Patients: PinkCheek.be Fact Sheet for Healthcare Providers: GravelBags.it This test is not yet approved or cleared by the [place] FDA and  has been authorized for detection and/or diagnosis of SARS-CoV-2 by  FDA under an Emergency Use Authorization (EUA). This EUA will remain  in effect (meaning this test can be used) for the duration of the  Covid-19 declaration under Section 564(b)(1) of the Act, 21  U.S.C. section 360bbb-3(b)(1), unless the authorization is  terminated or revoked. Performed at [facility], [place] [address], [place], [place] [address]     Procedures and diagnostic  studies:  No results found.  Medications:   . aMILoride  5 mg Oral Daily  . clopidogrel  75 mg Oral Daily  . divalproex  500 mg Oral QHS  . ferrous sulfate  325 mg Oral Q breakfast  . fluPHENAZine  4 mg Oral QHS  . heparin  5,000 Units Subcutaneous Q8H  . multivitamin with minerals  1 tablet Oral Daily  . sodium chloride flush  3 mL Intravenous Q12H   Continuous Infusions:   LOS: 4 days   Radene Gunning NP [facility]   How to contact the [facility] Attending or Consulting provider [place] or covering provider during after hours [place], for this patient?  1. Check the care team in [facility] and look for a)  attending/consulting TRH provider listed and b) the [facility] team listed 2. Log into [email or web address] and use [place]'s universal password to access. If you do not have the password, please contact the hospital operator. 3. Locate the [facility] provider you are looking for under [facility] and page to a number that you can be directly reached. 4. If you still have difficulty reaching the provider, please page the [facility] (Director on Call) for the Hospitalists listed on amion for assistance.  04/21/2019, 3:46 PM

## 2019-04-21 NOTE — Progress Notes (Signed)
  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  S/p hydration with normal saline - s/p D5W at 100 ml/hr x 48 hrs - will restart amiloride 5 mg daily--> pt agreeable.  Discussed with pt and sister together in room  # Lithium toxicity  - Patient's level supratherapeutic and note repeat levels off lithium are improving; schizoaffective disorder management per primary team   # AMS  - setting of elevated lithium level, now normal - resolved per patient    # Schizoaffective disorder - Per primary team.  They have spoken with her outpatient psych provider who recommended stopping lithium and switching to depakote   # CKD stage IIIb - AKI has improved to nearing the patient's apparent baseline of 1.5.    # Hypertension - Controlled   Subjective:    Met with pt and sister.  Agreeable to starting amiloride   Objective:   BP 119/67 (BP Location: Right Arm)   Pulse 78   Temp 97.9 F (36.6 C) (Oral)   Resp 18   SpO2 97%   Intake/Output Summary (Last 24 hours) at 04/21/2019 1643 Last data filed at 04/21/2019 1026 Gross per 24 hour  Intake 253 ml  Output 3800 ml  Net -3547 ml   Weight change:   Physical Exam: Gen: NAD sitting up CVS: RRR Resp: clear bilaterally Abd: soft, nontender Ext: no LE edema  Imaging: No results found.  Labs: BMET Recent Labs  Lab 04/17/19 0015 04/17/19 0315 04/17/19 [id number] 04/18/19 0503 04/19/19 0258 04/20/19 0546 04/21/19 0842  NA 140 140 142 147* 149* 147* 146*  K 4.3 4.0 4.3 4.0 4.1 4.4 4.5  CL 108 113* 111 119* 120* 117* 115*  CO2 21* 18* 20* 19* 20* 20* 22  GLUCOSE 98 108* 99 96 100* 121* 114*  BUN 31* 28* 26* '20 19 20 23  '$ CREATININE 2.00* 1.79* 1.71* 1.48* 1.45* 1.59* 1.47*  CALCIUM 10.3 9.3 9.0 9.1 9.5 9.3 9.4   CBC Recent Labs  Lab 04/16/19 2107 04/18/19 0503  WBC 9.1 6.5  HGB 12.4 11.3*  HCT 38.8  37.0  MCV 99.5 103.6*  PLT 238 185    Medications:    . aMILoride  5 mg Oral Daily  . clopidogrel  75 mg Oral Daily  . divalproex  500 mg Oral QHS  . ferrous sulfate  325 mg Oral Q breakfast  . fluPHENAZine  4 mg Oral QHS  . heparin  5,000 Units Subcutaneous Q8H  . multivitamin with minerals  1 tablet Oral Daily  . sodium chloride flush  3 mL Intravenous Q12H      [name] MD 04/21/2019, 4:43 PM

## 2019-04-22 ENCOUNTER — Encounter (HOSPITAL_COMMUNITY): Payer: Self-pay | Admitting: Internal Medicine

## 2019-04-22 DIAGNOSIS — N183 Chronic kidney disease, stage 3 unspecified: Secondary | ICD-10-CM

## 2019-04-22 DIAGNOSIS — R7989 Other specified abnormal findings of blood chemistry: Secondary | ICD-10-CM

## 2019-04-22 DIAGNOSIS — E232 Diabetes insipidus: Secondary | ICD-10-CM

## 2019-04-22 HISTORY — DX: Diabetes insipidus: E23.2

## 2019-04-22 LAB — BASIC METABOLIC PANEL
Anion gap: 9 (ref 5–15)
BUN: 26 mg/dL — ABNORMAL HIGH (ref 8–23)
CO2: 20 mmol/L — ABNORMAL LOW (ref 22–32)
Calcium: 9.1 mg/dL (ref 8.9–10.3)
Chloride: 113 mmol/L — ABNORMAL HIGH (ref 98–111)
Creatinine, Ser: 1.42 mg/dL — ABNORMAL HIGH (ref 0.44–1.00)
GFR calc Af Amer: 44 mL/min — ABNORMAL LOW (ref >60)
GFR calc non Af Amer: 38 mL/min — ABNORMAL LOW (ref >60)
Glucose, Bld: 132 mg/dL — ABNORMAL HIGH (ref 70–99)
Potassium: 4.5 mmol/L (ref 3.5–5.1)
Sodium: 142 mmol/L (ref 135–145)

## 2019-04-22 NOTE — Progress Notes (Signed)
PROGRESS NOTE  Bridget Jackson [id number] DOB: [birth year] DOA: 04/16/2019 PCP: Kathyrn Lass, MD  Brief History   69 year old woman on lithium for schizoaffective disorder presented with confusion and concern for lithium toxicity.  Lithium level was noted to be elevated.  Medication was withheld and patient was hydrated.  Metabolic encephalopathy resolved with withholding of lithium.  Also found to have acute kidney injury superimposed on CKD stage III of unclear etiology.  Mental status seems to have returned to baseline and kidney function is improved but patient found to have hypernatremia concern for nephrogenic diabetes insipidus.  A & P  Lithium toxicity with associated acute metabolic encephalopathy, inciting event unclear, resolved.  In discussion with primary behavioral health specialist decision was made to stop lithium and start Depakote in its place. 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  Treated with normal saline, then D5W, then started on amiloride by nephrology. --Hypernatremia appears resolved at this point.  Continue amiloride.  Follow-up with nephrology in 4-6 weeks.  Schizoaffective disorder previously on lithium.  Per discussion with outpatient behavioral health specialist decision was made to switch to Depakote. --Continue Depakote on discharge.  Continue Prolixin.  Trazodone for sleep.  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  UTI treated with ceftriaxone.  PMH stroke --Stable continue clopidogrel  Elevated TSH, free T4 within normal limits --Follow-up as an outpatient  Disposition Plan:  From: independent living Anticipated disposition: same Discussion: Doing well, continue amiloride, continue to monitor, anticipate discharge tomorrow 3/21  DVT prophylaxis: heparin Code Status: Full Family Communication: sister Debbie by  telephone   Murray Hodgkins, MD  [facility] Direct contact: see [email or web address] (further directions at bottom of note if needed) 7PM-7AM contact night coverage as at bottom of note 04/22/2019, 3:56 PM  LOS: 5 days     Consults:  . Nephrology   Procedures:  . None  Interval History/Subjective  Feels fine today, no complaints.  Objective   Vitals:  Vitals:   04/22/19 0503 04/22/19 1229  BP: 126/63 112/64  Pulse: 81 72  Resp: 18 16  Temp: 98.5 F (36.9 C) 98.2 F (36.8 C)  SpO2: 99% 97%    Exam:  Constitutional.  Appears calm, comfortable. Respiratory.  Clear to auscultation bilaterally.  No wheezes, rales or rhonchi.  Normal respiratory effort. Cardiovascular.  Regular rate and rhythm.  No murmur, rub or gallop.  Psychiatric.  Mood and affect appear stable.  I have personally reviewed the following:   Today's Data  . Urine output 2050 . Creatinine down to 1.42.  Sodium down to 142.  Scheduled Meds: . aMILoride  5 mg Oral Daily  . clopidogrel  75 mg Oral Daily  . divalproex  500 mg Oral QHS  . ferrous sulfate  325 mg Oral Q breakfast  . fluPHENAZine  4 mg Oral QHS  . heparin  5,000 Units Subcutaneous Q8H  . multivitamin with minerals  1 tablet Oral Daily  . sodium chloride flush  3 mL Intravenous Q12H  . traZODone  50 mg Oral QHS   Continuous Infusions:  Principal Problem:   Lithium toxicity Active Problems:   Schizoaffective disorder (HCC)   Acute metabolic encephalopathy   AKI (acute kidney injury) ([place])   Hypernatremia   Elevated TSH   Diabetes insipidus ([place])   CKD (chronic kidney disease), stage III   LOS: 5 days   How to contact the [facility] Attending or  Consulting provider [place] or covering provider during after hours [place], for this patient?  1. Check the care team in [facility] and look for a) attending/consulting TRH provider listed and b) the [facility] team listed 2. Log into [email or web address] and use [place]'s universal password to access. If you  do not have the password, please contact the hospital operator. 3. Locate the [facility] provider you are looking for under [facility] and page to a number that you can be directly reached. 4. If you still have difficulty reaching the provider, please page the [facility] (Director on Call) for the Hospitalists listed on amion for assistance.

## 2019-04-22 NOTE — Progress Notes (Addendum)
  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  S/p hydration with normal saline - s/p D5W at 100 ml/hr x 48 hrs - started amiloride 5 mg daily yesteray - Cr and Na improved, nothing further to add at this time, will sign off.  Call with questions.  Will arrange 4-6 week followup.  # Lithium toxicity  - Patient's level supratherapeutic and note repeat levels off lithium are improving; schizoaffective disorder management per primary team   # AMS  - setting of elevated lithium level, now normal - resolved per patient    # Schizoaffective disorder - Per primary team.  They have spoken with her outpatient psych provider who recommended stopping lithium and switching to depakote   # CKD stage IIIb - AKI has improved to nearing the patient's apparent baseline of 1.5.    # Hypertension - Controlled   Subjective:    Started amiloride yesterday.  Did well.  2.2L UOP,    Objective:   BP 126/63 (BP Location: Right Arm)   Pulse 81   Temp 98.5 F (36.9 C) (Oral)   Resp 18   SpO2 99%   Intake/Output Summary (Last 24 hours) at 04/22/2019 1038 Last data filed at 04/22/2019 0500 Gross per 24 hour  Intake 960 ml  Output 2250 ml  Net -1290 ml   Weight change:   Physical Exam: Gen: NAD sitting up CVS: RRR Resp: clear bilaterally Abd: soft, nontender Ext: no LE edema  Imaging: No results found.  Labs: BMET Recent Labs  Lab 04/17/19 0315 04/17/19 [id number] 04/18/19 0503 04/19/19 0258 04/20/19 0546 04/21/19 0842 04/22/19 0842  NA 140 142 147* 149* 147* 146* 142  K 4.0 4.3 4.0 4.1 4.4 4.5 4.5  CL 113* 111 119* 120* 117* 115* 113*  CO2 18* 20* 19* 20* 20* 22 20*  GLUCOSE 108* 99 96 100* 121* 114* 132*  BUN 28* 26* 20 19 20 23  26*  CREATININE 1.79* 1.71* 1.48* 1.45* 1.59* 1.47* 1.42*  CALCIUM 9.3 9.0 9.1 9.5 9.3 9.4 9.1   CBC Recent Labs  Lab  04/16/19 2107 04/18/19 0503  WBC 9.1 6.5  HGB 12.4 11.3*  HCT 38.8 37.0  MCV 99.5 103.6*  PLT 238 185    Medications:    . aMILoride  5 mg Oral Daily  . clopidogrel  75 mg Oral Daily  . divalproex  500 mg Oral QHS  . ferrous sulfate  325 mg Oral Q breakfast  . fluPHENAZine  4 mg Oral QHS  . heparin  5,000 Units Subcutaneous Q8H  . multivitamin with minerals  1 tablet Oral Daily  . sodium chloride flush  3 mL Intravenous Q12H  . traZODone  50 mg Oral QHS      [name] MD 04/22/2019, 10:38 AM

## 2019-04-23 DIAGNOSIS — N1832 Chronic kidney disease, stage 3b: Secondary | ICD-10-CM

## 2019-04-23 LAB — BASIC METABOLIC PANEL
Anion gap: 11 (ref 5–15)
BUN: 28 mg/dL — ABNORMAL HIGH (ref 8–23)
CO2: 21 mmol/L — ABNORMAL LOW (ref 22–32)
Calcium: 9.4 mg/dL (ref 8.9–10.3)
Chloride: 109 mmol/L (ref 98–111)
Creatinine, Ser: 1.45 mg/dL — ABNORMAL HIGH (ref 0.44–1.00)
GFR calc Af Amer: 42 mL/min — ABNORMAL LOW (ref >60)
GFR calc non Af Amer: 37 mL/min — ABNORMAL LOW (ref >60)
Glucose, Bld: 110 mg/dL — ABNORMAL HIGH (ref 70–99)
Potassium: 4.8 mmol/L (ref 3.5–5.1)
Sodium: 141 mmol/L (ref 135–145)

## 2019-04-23 MED ORDER — DIVALPROEX SODIUM ER 500 MG PO TB24: 500.0000 mg | ORAL_TABLET | Freq: Every day | ORAL | 0 refills | Status: DC

## 2019-04-23 MED ORDER — AMILORIDE HCL 5 MG PO TABS: 5.0000 mg | ORAL_TABLET | Freq: Every day | ORAL | 0 refills | Status: DC

## 2019-04-23 NOTE — Discharge Summary (Signed)
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  --Hypernatremia appears resolved.  Continue amiloride.  Follow-up with nephrology in 4-6 weeks.  Schizoaffective disorder previously on lithium.  Per discussion with outpatient behavioral health specialist decision was made to switch to Depakote. --Continue Depakote on discharge.  Continue Prolixin.  Trazodone for sleep.  Elevated TSH, free T4 within normal limits --Follow-up as an outpatient  Follow-up Information    Kathyrn Lass, MD. Schedule an appointment as soon as possible for a visit in 2 week(s).   Specialty: Family Medicine Contact information: [place] [address] [phone]        [name], MD Follow up in 4 week(s).   Specialty: Nephrology Why: Office should call you with an appointment for 4-6 weeks. Call office if you have not heard by 3/24. Contact information: [place] [address] [phone]        Care, [facility] Follow up.   Specialty: [place] Why: For home health services  Contact information: [place] [address] [phone]            Discharge Diagnoses: Principal diagnosis is #1 1. Lithium toxicity with associated acute metabolic encephalopathy 2. 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 Schizoaffective disorder 4. 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 UTI treated with ceftriaxone. 6. Elevated TSH, free T4 within normal limits  Discharge Condition: improved Disposition: independent living/home  Diet recommendation: regular   History of present illness:  69 year old woman on lithium for schizoaffective  disorder presented with confusion and concern for lithium toxicity.   Hospital Course:  Lithium level was noted to be elevated. Medication was withheld and patient was hydrated. Metabolic encephalopathy resolved with withholding of lithium. Also found to have acute kidney injury superimposed on CKD stage III of unclear etiology. Mental status seems to have returned to baseline and kidney function is improved but patient found to have hypernatremia concern for nephrogenic diabetes insipidus.  She was seen by nephrology, treated with D5W and then started on amiloride with stabilization of her sodium.  Mental status remained stable.  Plan for outpatient follow-up with behavioral health and nephrology.  Lithium toxicity with associated acute metabolic encephalopathy, inciting event unclear, resolved.  In discussion with primary behavioral health specialist decision was made to stop lithium and start Depakote in its place. 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  Treated with normal saline, then D5W, then started on amiloride by nephrology. --Hypernatremia appears resolved.  Continue amiloride.  Follow-up with nephrology in 4-6 weeks.  Schizoaffective disorder previously on lithium.  Per discussion with outpatient behavioral health specialist decision was made to switch to Depakote. --Continue Depakote on discharge.  Continue Prolixin.  Trazodone for sleep.  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  UTI treated with ceftriaxone.  PMH stroke --Stable, continue clopidogrel  Elevated TSH, free T4 within normal limits --Follow-up as an outpatient  Consults:  . Nephrology   Procedures:  . None   Today's assessment: S: Feels fine, no complaints.  Sister at bedside. O: Vitals:  Vitals:   04/22/19 2326 04/23/19 0428  BP: 115/72 119/68  Pulse: 79 72  Resp: 18 18  Temp:  97.7 F (36.5 C) 98.3 F (36.8 C)  SpO2: 98% 99%    Constitutional:  . Appears calm and comfortable Respiratory:  . CTA bilaterally, no w/r/r.  . Respiratory effort normal.  Cardiovascular:  . RRR, no m/r/g Psychiatric:  . Mental status o Mood, affect appropriate  Urine output 1 L BUN without significant change 28.  Creatinine stable 1.45.  Sodium stable at 141.  Potassium within normal limits.  Discharge Instructions  Discharge Instructions    Diet general   Complete by: As directed    Discharge instructions   Complete by: As directed    Call your physician or seek immediate medical attention for confusion, dehydration, frequent urination.   Increase activity slowly   Complete by: As directed      Allergies as of 04/23/2019      Reactions   Amiloride Other (See Comments)   Lithium Toxic   Lipitor [atorvastatin Calcium]    Provera [medroxyprogesterone Acetate]    Prednisone    Other reaction(s): Other      Medication List    STOP taking these medications   lithium carbonate 300 MG capsule     TAKE these medications   acetaminophen 500 MG tablet Commonly known as: TYLENOL Take 500 mg by mouth every 8 (eight) hours as needed for mild pain.   aMILoride 5 MG tablet Commonly known as: MIDAMOR Take 1 tablet (5 mg total) by mouth daily. Start taking on: April 24, 2019 Notes to patient: 04/24/19   clopidogrel 75 MG tablet Commonly known as: PLAVIX Take 75 mg by mouth daily. Notes to patient: 04/24/19   divalproex 500 MG 24 hr tablet Commonly known as: DEPAKOTE ER Take 1 tablet (500 mg total) by mouth at bedtime. Notes to patient: 04/23/19   ferrous sulfate 325 (65 FE) MG tablet Take 325 mg by mouth daily with breakfast. Notes to patient: 04/24/19   fluPHENAZine 1 MG tablet Commonly known as: PROLIXIN Take 4 tablets (4 mg total) by mouth at bedtime. Notes to patient: 04/23/19   Multivitamin Adults Tabs Take 1 tablet by mouth daily. Notes to patient:  04/24/19   traZODone 50 MG tablet Commonly known as: DESYREL Take 1 tablet (50 mg total) by mouth at bedtime. Notes to patient: 04/23/19   Vitamin D (Ergocalciferol) 1.25 MG (50000 UNIT) Caps capsule Commonly known as: DRISDOL Take 50,000 Units by mouth every Wednesday. Notes to patient: Continue home schedule      Allergies  Allergen Reactions  . Amiloride Other (See Comments)    Lithium Toxic  . Lipitor [Atorvastatin Calcium]   . Provera [Medroxyprogesterone Acetate]   . Prednisone     Other reaction(s): Other    The results of significant diagnostics from this hospitalization (including imaging, microbiology, ancillary and laboratory) are listed below for reference.    Significant Diagnostic Studies: CT Head Wo Contrast  Result Date: 04/16/2019 CLINICAL DATA:  Encephalopathy EXAM: CT HEAD WITHOUT CONTRAST TECHNIQUE: Contiguous axial images were obtained from the base of the skull through the vertex without intravenous contrast. COMPARISON:  None. FINDINGS: Brain: There is no mass, hemorrhage or extra-axial collection. The appearance of the white matter is normal for the patient's age. There is generalized atrophy. Cystic focus in the left lentiform nucleus region, likely enlarged perivascular space. Vascular: No abnormal hyperdensity of the major intracranial arteries or dural venous sinuses. No intracranial atherosclerosis. Skull: The visualized skull base, calvarium and extracranial soft tissues are normal. Sinuses/Orbits: No fluid levels or advanced mucosal thickening  of the visualized paranasal sinuses. No mastoid or middle ear effusion. The orbits are normal. IMPRESSION: 1. No acute intracranial abnormality. 2. Generalized volume loss. Electronically Signed   By: Ulyses Jarred M.D.   On: 04/16/2019 23:52    Microbiology: Recent Results (from the past 240 hour(s))  Urine culture     Status: Abnormal   Collection Time: 04/16/19 11:22 PM   Specimen: Urine, Random  Result Value  Ref Range Status   Specimen Description URINE, RANDOM  Final   Special Requests   Final    NONE Performed at [facility], [address] [place], [place] [address]    Culture MULTIPLE SPECIES PRESENT, SUGGEST RECOLLECTION (A)  Final   Report Status 04/18/2019 FINAL  Final  Respiratory Panel by RT PCR (Flu A&B, Covid) - Nasopharyngeal Swab     Status: None   Collection Time: 04/17/19 12:27 AM   Specimen: Nasopharyngeal Swab  Result Value Ref Range Status   SARS Coronavirus 2 by RT PCR NEGATIVE NEGATIVE Final    Comment: (NOTE) SARS-CoV-2 target nucleic acids are NOT DETECTED. The SARS-CoV-2 RNA is generally detectable in upper respiratoy specimens during the acute phase of infection. The lowest concentration of SARS-CoV-2 viral copies this assay can detect is 131 copies/mL. A negative result does not preclude SARS-Cov-2 infection and should not be used as the sole basis for treatment or other patient management decisions. A negative result may occur with  improper specimen collection/handling, submission of specimen other than nasopharyngeal swab, presence of viral mutation(s) within the areas targeted by this assay, and inadequate number of viral copies (<131 copies/mL). A negative result must be combined with clinical observations, patient history, and epidemiological information. The expected result is Negative. Fact Sheet for Patients:  PinkCheek.be Fact Sheet for Healthcare Providers:  GravelBags.it This test is not yet ap proved or cleared by the [place] FDA and  has been authorized for detection and/or diagnosis of SARS-CoV-2 by FDA under an Emergency Use Authorization (EUA). This EUA will remain  in effect (meaning this test can be used) for the duration of the COVID-19 declaration under Section 564(b)(1) of the Act, 21 U.S.C. section 360bbb-3(b)(1), unless the authorization is terminated or revoked  sooner.    Influenza A by PCR NEGATIVE NEGATIVE Final   Influenza B by PCR NEGATIVE NEGATIVE Final    Comment: (NOTE) The Xpert Xpress SARS-CoV-2/FLU/RSV assay is intended as an aid in  the diagnosis of influenza from Nasopharyngeal swab specimens and  should not be used as a sole basis for treatment. Nasal washings and  aspirates are unacceptable for Xpert Xpress SARS-CoV-2/FLU/RSV  testing. Fact Sheet for Patients: PinkCheek.be Fact Sheet for Healthcare Providers: GravelBags.it This test is not yet approved or cleared by the [place] FDA and  has been authorized for detection and/or diagnosis of SARS-CoV-2 by  FDA under an Emergency Use Authorization (EUA). This EUA will remain  in effect (meaning this test can be used) for the duration of the  Covid-19 declaration under Section 564(b)(1) of the Act, 21  U.S.C. section 360bbb-3(b)(1), unless the authorization is  terminated or revoked. Performed at [facility], [place] [address], [place], [place] [address]      Labs: Basic Metabolic Panel: Recent Labs  Lab 04/19/19 0258 04/20/19 0546 04/21/19 0842 04/22/19 0842 04/23/19 0453  NA 149* 147* 146* 142 141  K 4.1 4.4 4.5 4.5 4.8  CL 120* 117* 115* 113* 109  CO2 20* 20* 22 20* 21*  GLUCOSE 100* 121* 114* 132* 110*  BUN 19 20 23  26* 28*  CREATININE 1.45* 1.59* 1.47* 1.42* 1.45*  CALCIUM 9.5 9.3 9.4 9.1 9.4   Liver Function Tests: Recent Labs  Lab 04/16/19 2107 04/20/19 0546  AST 19 24  ALT 27 27  ALKPHOS 95 88  BILITOT 0.5 0.8  PROT 6.6 6.2*  ALBUMIN 4.1 3.5   CBC: Recent Labs  Lab 04/16/19 2107 04/18/19 0503  WBC 9.1 6.5  HGB 12.4 11.3*  HCT 38.8 37.0  MCV 99.5 103.6*  PLT 238 185    CBG: Recent Labs  Lab 04/17/19 0012  GLUCAP 90    Principal Problem:   Lithium toxicity Active Problems:   Schizoaffective disorder (HCC)   Acute metabolic encephalopathy   AKI (acute kidney  injury) (HCC)   Hypernatremia   Elevated TSH   Diabetes insipidus ([place])   CKD (chronic kidney disease), stage III   Time coordinating discharge: 25 minutes  Signed:  Murray Hodgkins, MD  [facility]  04/23/2019, 3:46 PM

## 2019-04-23 NOTE — Care Management (Signed)
Notified [facility] today

## 2019-04-24 ENCOUNTER — Telehealth: Payer: Self-pay | Admitting: Psychiatry

## 2019-04-24 DIAGNOSIS — N179 Acute kidney failure, unspecified: Secondary | ICD-10-CM | POA: Diagnosis not present

## 2019-04-24 DIAGNOSIS — M1711 Unilateral primary osteoarthritis, right knee: Secondary | ICD-10-CM | POA: Diagnosis not present

## 2019-04-24 DIAGNOSIS — G9341 Metabolic encephalopathy: Secondary | ICD-10-CM | POA: Diagnosis not present

## 2019-04-24 DIAGNOSIS — Z87440 Personal history of urinary (tract) infections: Secondary | ICD-10-CM | POA: Diagnosis not present

## 2019-04-24 DIAGNOSIS — Z7902 Long term (current) use of antithrombotics/antiplatelets: Secondary | ICD-10-CM | POA: Diagnosis not present

## 2019-04-24 DIAGNOSIS — D631 Anemia in chronic kidney disease: Secondary | ICD-10-CM | POA: Diagnosis not present

## 2019-04-24 DIAGNOSIS — T43591D Poisoning by other antipsychotics and neuroleptics, accidental (unintentional), subsequent encounter: Secondary | ICD-10-CM | POA: Diagnosis not present

## 2019-04-24 DIAGNOSIS — H409 Unspecified glaucoma: Secondary | ICD-10-CM | POA: Diagnosis not present

## 2019-04-24 DIAGNOSIS — E785 Hyperlipidemia, unspecified: Secondary | ICD-10-CM | POA: Diagnosis not present

## 2019-04-24 DIAGNOSIS — I129 Hypertensive chronic kidney disease with stage 1 through stage 4 chronic kidney disease, or unspecified chronic kidney disease: Secondary | ICD-10-CM | POA: Diagnosis not present

## 2019-04-24 DIAGNOSIS — N183 Chronic kidney disease, stage 3 unspecified: Secondary | ICD-10-CM | POA: Diagnosis not present

## 2019-04-24 DIAGNOSIS — F259 Schizoaffective disorder, unspecified: Secondary | ICD-10-CM | POA: Diagnosis not present

## 2019-04-24 DIAGNOSIS — F419 Anxiety disorder, unspecified: Secondary | ICD-10-CM | POA: Diagnosis not present

## 2019-04-24 DIAGNOSIS — F329 Major depressive disorder, single episode, unspecified: Secondary | ICD-10-CM | POA: Diagnosis not present

## 2019-04-24 DIAGNOSIS — G4739 Other sleep apnea: Secondary | ICD-10-CM | POA: Diagnosis not present

## 2019-04-24 DIAGNOSIS — Z9181 History of falling: Secondary | ICD-10-CM | POA: Diagnosis not present

## 2019-04-24 DIAGNOSIS — Z8673 Personal history of transient ischemic attack (TIA), and cerebral infarction without residual deficits: Secondary | ICD-10-CM | POA: Diagnosis not present

## 2019-04-24 NOTE — Telephone Encounter (Signed)
Patient 's sister called and said that judy is out of the hospital and that you were going to work her in. She already had an appt on 4/16. Do you want something sooner. Please let me know

## 2019-04-25 DIAGNOSIS — F259 Schizoaffective disorder, unspecified: Secondary | ICD-10-CM | POA: Diagnosis not present

## 2019-04-25 DIAGNOSIS — I129 Hypertensive chronic kidney disease with stage 1 through stage 4 chronic kidney disease, or unspecified chronic kidney disease: Secondary | ICD-10-CM | POA: Diagnosis not present

## 2019-04-25 DIAGNOSIS — M1711 Unilateral primary osteoarthritis, right knee: Secondary | ICD-10-CM | POA: Diagnosis not present

## 2019-04-25 DIAGNOSIS — N179 Acute kidney failure, unspecified: Secondary | ICD-10-CM | POA: Diagnosis not present

## 2019-04-25 DIAGNOSIS — G9341 Metabolic encephalopathy: Secondary | ICD-10-CM | POA: Diagnosis not present

## 2019-04-25 DIAGNOSIS — T43591D Poisoning by other antipsychotics and neuroleptics, accidental (unintentional), subsequent encounter: Secondary | ICD-10-CM | POA: Diagnosis not present

## 2019-04-25 NOTE — Telephone Encounter (Signed)
She is coming tomorrow at 2pm

## 2019-04-26 ENCOUNTER — Ambulatory Visit (INDEPENDENT_AMBULATORY_CARE_PROVIDER_SITE_OTHER): Payer: Medicare Other | Admitting: Psychiatry

## 2019-04-26 ENCOUNTER — Encounter: Payer: Self-pay | Admitting: Psychiatry

## 2019-04-26 ENCOUNTER — Other Ambulatory Visit: Payer: Self-pay

## 2019-04-26 VITALS — BP 132/83 | HR 77

## 2019-04-26 DIAGNOSIS — F259 Schizoaffective disorder, unspecified: Secondary | ICD-10-CM | POA: Diagnosis not present

## 2019-04-26 DIAGNOSIS — N183 Chronic kidney disease, stage 3 unspecified: Secondary | ICD-10-CM | POA: Diagnosis not present

## 2019-04-26 DIAGNOSIS — R531 Weakness: Secondary | ICD-10-CM | POA: Diagnosis not present

## 2019-04-26 DIAGNOSIS — E870 Hyperosmolality and hypernatremia: Secondary | ICD-10-CM | POA: Diagnosis not present

## 2019-04-26 DIAGNOSIS — T56891D Toxic effect of other metals, accidental (unintentional), subsequent encounter: Secondary | ICD-10-CM | POA: Diagnosis not present

## 2019-04-26 MED ORDER — FLUPHENAZINE HCL 1 MG PO TABS: 4.0000 mg | ORAL_TABLET | Freq: Every day | ORAL | 1 refills | Status: DC

## 2019-04-26 MED ORDER — DIVALPROEX SODIUM ER 500 MG PO TB24: 500.0000 mg | ORAL_TABLET | Freq: Every day | ORAL | 0 refills | Status: DC
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
## 2019-04-28 DIAGNOSIS — M1711 Unilateral primary osteoarthritis, right knee: Secondary | ICD-10-CM | POA: Diagnosis not present

## 2019-04-28 DIAGNOSIS — G9341 Metabolic encephalopathy: Secondary | ICD-10-CM | POA: Diagnosis not present

## 2019-04-28 DIAGNOSIS — N179 Acute kidney failure, unspecified: Secondary | ICD-10-CM | POA: Diagnosis not present

## 2019-04-28 DIAGNOSIS — I129 Hypertensive chronic kidney disease with stage 1 through stage 4 chronic kidney disease, or unspecified chronic kidney disease: Secondary | ICD-10-CM | POA: Diagnosis not present

## 2019-04-28 DIAGNOSIS — T43591D Poisoning by other antipsychotics and neuroleptics, accidental (unintentional), subsequent encounter: Secondary | ICD-10-CM | POA: Diagnosis not present

## 2019-04-28 DIAGNOSIS — F259 Schizoaffective disorder, unspecified: Secondary | ICD-10-CM | POA: Diagnosis not present

## 2019-05-01 DIAGNOSIS — I129 Hypertensive chronic kidney disease with stage 1 through stage 4 chronic kidney disease, or unspecified chronic kidney disease: Secondary | ICD-10-CM | POA: Diagnosis not present

## 2019-05-01 DIAGNOSIS — G9341 Metabolic encephalopathy: Secondary | ICD-10-CM | POA: Diagnosis not present

## 2019-05-01 DIAGNOSIS — M1711 Unilateral primary osteoarthritis, right knee: Secondary | ICD-10-CM | POA: Diagnosis not present

## 2019-05-01 DIAGNOSIS — F259 Schizoaffective disorder, unspecified: Secondary | ICD-10-CM | POA: Diagnosis not present

## 2019-05-01 DIAGNOSIS — N179 Acute kidney failure, unspecified: Secondary | ICD-10-CM | POA: Diagnosis not present

## 2019-05-01 DIAGNOSIS — T43591D Poisoning by other antipsychotics and neuroleptics, accidental (unintentional), subsequent encounter: Secondary | ICD-10-CM | POA: Diagnosis not present

## 2019-05-02 DIAGNOSIS — F259 Schizoaffective disorder, unspecified: Secondary | ICD-10-CM | POA: Diagnosis not present

## 2019-05-02 DIAGNOSIS — N179 Acute kidney failure, unspecified: Secondary | ICD-10-CM | POA: Diagnosis not present

## 2019-05-02 DIAGNOSIS — G9341 Metabolic encephalopathy: Secondary | ICD-10-CM | POA: Diagnosis not present

## 2019-05-02 DIAGNOSIS — I129 Hypertensive chronic kidney disease with stage 1 through stage 4 chronic kidney disease, or unspecified chronic kidney disease: Secondary | ICD-10-CM | POA: Diagnosis not present

## 2019-05-02 DIAGNOSIS — T43591D Poisoning by other antipsychotics and neuroleptics, accidental (unintentional), subsequent encounter: Secondary | ICD-10-CM | POA: Diagnosis not present

## 2019-05-02 DIAGNOSIS — M1711 Unilateral primary osteoarthritis, right knee: Secondary | ICD-10-CM | POA: Diagnosis not present

## 2019-05-03 ENCOUNTER — Telehealth: Payer: Self-pay | Admitting: Psychiatry

## 2019-05-03 ENCOUNTER — Other Ambulatory Visit: Payer: Self-pay

## 2019-05-03 DIAGNOSIS — G9341 Metabolic encephalopathy: Secondary | ICD-10-CM | POA: Diagnosis not present

## 2019-05-03 DIAGNOSIS — I129 Hypertensive chronic kidney disease with stage 1 through stage 4 chronic kidney disease, or unspecified chronic kidney disease: Secondary | ICD-10-CM | POA: Diagnosis not present

## 2019-05-03 DIAGNOSIS — N179 Acute kidney failure, unspecified: Secondary | ICD-10-CM | POA: Diagnosis not present

## 2019-05-03 DIAGNOSIS — M1711 Unilateral primary osteoarthritis, right knee: Secondary | ICD-10-CM | POA: Diagnosis not present

## 2019-05-03 DIAGNOSIS — F259 Schizoaffective disorder, unspecified: Secondary | ICD-10-CM | POA: Diagnosis not present

## 2019-05-03 DIAGNOSIS — T43591D Poisoning by other antipsychotics and neuroleptics, accidental (unintentional), subsequent encounter: Secondary | ICD-10-CM | POA: Diagnosis not present

## 2019-05-03 MED ORDER — DIVALPROEX SODIUM ER 250 MG PO TB24: ORAL_TABLET | ORAL | 0 refills | Status: DC

## 2019-05-03 NOTE — Telephone Encounter (Signed)
Okay to send in prescription for Depakote ER 250 mg daily 3 nightly.  She is currently taking Depakote ER 500 mg 1 nightly.

## 2019-05-03 NOTE — Telephone Encounter (Signed)
Spoke with Bridget Jackson and given recommendation to increase by 250 mg, she was hoping by 500 mg but I explained need to try this first then it can be increased again if needed. She agreed. She asked for 250 mg submitted to [facility], she said they have plenty of the 500 mg.

## 2019-05-03 NOTE — Telephone Encounter (Signed)
FYI

## 2019-05-03 NOTE — Telephone Encounter (Signed)
Bridget Jackson, sister, reports that Bridget Jackson is not doing as well. Seems like she is paranoid or guarded, answers only with yes or no. Looks as if she stares blankly ahead.  She has described herself as "pensive" and this word is unusual word for her to use. Last night, she didn't sleep much at all and almost never has trouble sleeping. Thinks she is agitated too. Sister just feels she is getting paranoid, and starting to show signs of not doing well. Slowing down with her psychomotor reflexes. Can she go ahead and increase her dosage of Depakote at this time?

## 2019-05-04 DIAGNOSIS — N183 Chronic kidney disease, stage 3 unspecified: Secondary | ICD-10-CM | POA: Diagnosis not present

## 2019-05-04 DIAGNOSIS — E870 Hyperosmolality and hypernatremia: Secondary | ICD-10-CM | POA: Diagnosis not present

## 2019-05-04 DIAGNOSIS — T56891D Toxic effect of other metals, accidental (unintentional), subsequent encounter: Secondary | ICD-10-CM | POA: Diagnosis not present

## 2019-05-04 DIAGNOSIS — R531 Weakness: Secondary | ICD-10-CM | POA: Diagnosis not present

## 2019-05-04 DIAGNOSIS — F259 Schizoaffective disorder, unspecified: Secondary | ICD-10-CM | POA: Diagnosis not present

## 2019-05-08 ENCOUNTER — Telehealth: Payer: Self-pay | Admitting: Psychiatry

## 2019-05-08 DIAGNOSIS — G9341 Metabolic encephalopathy: Secondary | ICD-10-CM | POA: Diagnosis not present

## 2019-05-08 DIAGNOSIS — N179 Acute kidney failure, unspecified: Secondary | ICD-10-CM | POA: Diagnosis not present

## 2019-05-08 DIAGNOSIS — T43591D Poisoning by other antipsychotics and neuroleptics, accidental (unintentional), subsequent encounter: Secondary | ICD-10-CM | POA: Diagnosis not present

## 2019-05-08 DIAGNOSIS — F259 Schizoaffective disorder, unspecified: Secondary | ICD-10-CM | POA: Diagnosis not present

## 2019-05-08 DIAGNOSIS — I129 Hypertensive chronic kidney disease with stage 1 through stage 4 chronic kidney disease, or unspecified chronic kidney disease: Secondary | ICD-10-CM | POA: Diagnosis not present

## 2019-05-08 DIAGNOSIS — M1711 Unilateral primary osteoarthritis, right knee: Secondary | ICD-10-CM | POA: Diagnosis not present

## 2019-05-08 MED ORDER — FLUPHENAZINE HCL 1 MG PO TABS: 5.0000 mg | ORAL_TABLET | Freq: Every day | ORAL | 0 refills | Status: DC

## 2019-05-08 MED ORDER — DIVALPROEX SODIUM ER 500 MG PO TB24: 1000.0000 mg | ORAL_TABLET | Freq: Every day | ORAL | 0 refills | Status: DC

## 2019-05-08 NOTE — Telephone Encounter (Addendum)
Returned call to pt's sister/PoA. Sister reports that pt had worsening mood s/s on Depakote ER 500 mg po QHS and after contacting clinic took Depakote ER 750 mg po QHS and was improved the next day. Sister reports that pt was reluctant to continue to take Depakote ER 750 mg po QHS. Sister reports that yesterday pt was guarded and paranoid. Sister reports that pt was suspicious of food that she brought in. At times pt has been staring up at the ceiling and at times is not responding to questions, other times is using words that she would typically not use. Sister reports that pt has been agitated at times and refusing to walk in the hall. Sister noticed thought blocking and pt was having difficulty getting her words out. Sister learned on Sunday that pt did not take any Depakote ER Friday night and had taken medication on Saturday. Sister reports that pt agreed to take HS meds last night. Sister reports that the PT called her today and said pt knew PT's name but did not seem to know his role, which was very unusual. PT noticed a significant change in her mood and behavior. Had one sleepless night. Slept the night she took Depakote ER 750 mg po QHS. No suicidal or homicidal ideation noted or verbalized.   No change in speech or gait. Some psychomotor retardation. Some increase in tremor in shoulders and arm.   Case staffed with Dr. Clovis Pu.   Will increase Depakote ER to 1000 mg po QHS and increase Prolixin to 5 mg po QHS.

## 2019-05-08 NOTE — Telephone Encounter (Signed)
Called sister/POA back to discuss plan. Sister reports that she went to visit pt today and noticed some paranoia with pt this afternoon.   Discussed plan with [name] and she verbalized understanding. Discussed taking pt to ER if psychotic s/s worsen and/or interfere with function, such as refusing meds and food, or if there are any safety issues. Patient advised to contact office with any questions, adverse effects, or acute worsening in signs and symptoms.  Will f/u in 05/19/19 as scheduled or sooner if clinically indicated.

## 2019-05-08 NOTE — Telephone Encounter (Signed)
[name] called and said that she want s to hospitalize Bridget Jackson and she wants to talk to you. She saysd this is urgent!! Please call her at [phone]

## 2019-05-10 DIAGNOSIS — G9341 Metabolic encephalopathy: Secondary | ICD-10-CM | POA: Diagnosis not present

## 2019-05-10 DIAGNOSIS — N179 Acute kidney failure, unspecified: Secondary | ICD-10-CM | POA: Diagnosis not present

## 2019-05-10 DIAGNOSIS — T43591D Poisoning by other antipsychotics and neuroleptics, accidental (unintentional), subsequent encounter: Secondary | ICD-10-CM | POA: Diagnosis not present

## 2019-05-10 DIAGNOSIS — M1711 Unilateral primary osteoarthritis, right knee: Secondary | ICD-10-CM | POA: Diagnosis not present

## 2019-05-10 DIAGNOSIS — F259 Schizoaffective disorder, unspecified: Secondary | ICD-10-CM | POA: Diagnosis not present

## 2019-05-10 DIAGNOSIS — I129 Hypertensive chronic kidney disease with stage 1 through stage 4 chronic kidney disease, or unspecified chronic kidney disease: Secondary | ICD-10-CM | POA: Diagnosis not present

## 2019-05-11 DIAGNOSIS — I129 Hypertensive chronic kidney disease with stage 1 through stage 4 chronic kidney disease, or unspecified chronic kidney disease: Secondary | ICD-10-CM | POA: Diagnosis not present

## 2019-05-11 DIAGNOSIS — F259 Schizoaffective disorder, unspecified: Secondary | ICD-10-CM | POA: Diagnosis not present

## 2019-05-11 DIAGNOSIS — G9341 Metabolic encephalopathy: Secondary | ICD-10-CM | POA: Diagnosis not present

## 2019-05-11 DIAGNOSIS — N179 Acute kidney failure, unspecified: Secondary | ICD-10-CM | POA: Diagnosis not present

## 2019-05-11 DIAGNOSIS — T43591D Poisoning by other antipsychotics and neuroleptics, accidental (unintentional), subsequent encounter: Secondary | ICD-10-CM | POA: Diagnosis not present

## 2019-05-11 DIAGNOSIS — M1711 Unilateral primary osteoarthritis, right knee: Secondary | ICD-10-CM | POA: Diagnosis not present

## 2019-05-15 ENCOUNTER — Other Ambulatory Visit: Payer: Self-pay

## 2019-05-15 ENCOUNTER — Telehealth: Payer: Self-pay | Admitting: Psychiatry

## 2019-05-15 DIAGNOSIS — F259 Schizoaffective disorder, unspecified: Secondary | ICD-10-CM

## 2019-05-15 MED ORDER — DIVALPROEX SODIUM ER 500 MG PO TB24: 1500.0000 mg | ORAL_TABLET | Freq: Every day | ORAL | 0 refills | Status: DC

## 2019-05-15 NOTE — Telephone Encounter (Signed)
Returned call to sister. Sister reports that pt's paranoia has improved and remains slightly suspicious. Sister reports that pt's mood has been very labile, agitated, and irritable which is uncharacteristic of her. Pt has also been complaining about different things, such as too many olives being put on the sub she brought her. Thought content has been negative. Not staring off as much as she was previously. Sister reports that pt asked her about her beliefs in [place] recently and that she has had some religious preoccupation in the past. Sleeping at night. Sister reports that pt's tremor is about the same "or a little worse." Pt has been impulsive. Sister reports some improvements compared to last week. Sister reports that pt has not verbalized any suicidal thoughts.  Plan: Increase to Depakote ER 1500 mg po QHS to improve mood s/s. Continue Fluphenazine 5 mg po QHS. F/U this week.

## 2019-05-15 NOTE — Telephone Encounter (Signed)
Debra Pt sister called to report Pt needs med adjustment again. Return call to [facility] @ [phone]

## 2019-05-16 DIAGNOSIS — T43591D Poisoning by other antipsychotics and neuroleptics, accidental (unintentional), subsequent encounter: Secondary | ICD-10-CM | POA: Diagnosis not present

## 2019-05-16 DIAGNOSIS — F259 Schizoaffective disorder, unspecified: Secondary | ICD-10-CM | POA: Diagnosis not present

## 2019-05-16 DIAGNOSIS — N179 Acute kidney failure, unspecified: Secondary | ICD-10-CM | POA: Diagnosis not present

## 2019-05-16 DIAGNOSIS — G9341 Metabolic encephalopathy: Secondary | ICD-10-CM | POA: Diagnosis not present

## 2019-05-16 DIAGNOSIS — M1711 Unilateral primary osteoarthritis, right knee: Secondary | ICD-10-CM | POA: Diagnosis not present

## 2019-05-16 DIAGNOSIS — I129 Hypertensive chronic kidney disease with stage 1 through stage 4 chronic kidney disease, or unspecified chronic kidney disease: Secondary | ICD-10-CM | POA: Diagnosis not present

## 2019-05-18 DIAGNOSIS — F259 Schizoaffective disorder, unspecified: Secondary | ICD-10-CM | POA: Diagnosis not present

## 2019-05-18 DIAGNOSIS — N179 Acute kidney failure, unspecified: Secondary | ICD-10-CM | POA: Diagnosis not present

## 2019-05-18 DIAGNOSIS — T43591D Poisoning by other antipsychotics and neuroleptics, accidental (unintentional), subsequent encounter: Secondary | ICD-10-CM | POA: Diagnosis not present

## 2019-05-18 DIAGNOSIS — I129 Hypertensive chronic kidney disease with stage 1 through stage 4 chronic kidney disease, or unspecified chronic kidney disease: Secondary | ICD-10-CM | POA: Diagnosis not present

## 2019-05-18 DIAGNOSIS — M1711 Unilateral primary osteoarthritis, right knee: Secondary | ICD-10-CM | POA: Diagnosis not present

## 2019-05-18 DIAGNOSIS — G9341 Metabolic encephalopathy: Secondary | ICD-10-CM | POA: Diagnosis not present

## 2019-05-19 ENCOUNTER — Ambulatory Visit (INDEPENDENT_AMBULATORY_CARE_PROVIDER_SITE_OTHER): Payer: Medicare Other | Admitting: Psychiatry

## 2019-05-19 ENCOUNTER — Encounter: Payer: Self-pay | Admitting: Psychiatry

## 2019-05-19 ENCOUNTER — Other Ambulatory Visit: Payer: Self-pay

## 2019-05-19 DIAGNOSIS — F259 Schizoaffective disorder, unspecified: Secondary | ICD-10-CM | POA: Diagnosis not present

## 2019-05-19 DIAGNOSIS — F5101 Primary insomnia: Secondary | ICD-10-CM | POA: Diagnosis not present

## 2019-05-19 MED ORDER — FLUPHENAZINE HCL 5 MG PO TABS: 5.0000 mg | ORAL_TABLET | Freq: Every day | ORAL | 0 refills | Status: DC

## 2019-05-19 MED ORDER — DIVALPROEX SODIUM ER 250 MG PO TB24: 1500.0000 mg | ORAL_TABLET | Freq: Every day | ORAL | 0 refills | Status: AC
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
## 2019-05-23 DIAGNOSIS — M1711 Unilateral primary osteoarthritis, right knee: Secondary | ICD-10-CM | POA: Diagnosis not present

## 2019-05-23 DIAGNOSIS — G9341 Metabolic encephalopathy: Secondary | ICD-10-CM | POA: Diagnosis not present

## 2019-05-23 DIAGNOSIS — T43591D Poisoning by other antipsychotics and neuroleptics, accidental (unintentional), subsequent encounter: Secondary | ICD-10-CM | POA: Diagnosis not present

## 2019-05-23 DIAGNOSIS — I129 Hypertensive chronic kidney disease with stage 1 through stage 4 chronic kidney disease, or unspecified chronic kidney disease: Secondary | ICD-10-CM | POA: Diagnosis not present

## 2019-05-23 DIAGNOSIS — N179 Acute kidney failure, unspecified: Secondary | ICD-10-CM | POA: Diagnosis not present

## 2019-05-23 DIAGNOSIS — F259 Schizoaffective disorder, unspecified: Secondary | ICD-10-CM | POA: Diagnosis not present

## 2019-05-24 DIAGNOSIS — Z87440 Personal history of urinary (tract) infections: Secondary | ICD-10-CM | POA: Diagnosis not present

## 2019-05-24 DIAGNOSIS — M1711 Unilateral primary osteoarthritis, right knee: Secondary | ICD-10-CM | POA: Diagnosis not present

## 2019-05-24 DIAGNOSIS — Z9181 History of falling: Secondary | ICD-10-CM | POA: Diagnosis not present

## 2019-05-24 DIAGNOSIS — I129 Hypertensive chronic kidney disease with stage 1 through stage 4 chronic kidney disease, or unspecified chronic kidney disease: Secondary | ICD-10-CM | POA: Diagnosis not present

## 2019-05-24 DIAGNOSIS — G4739 Other sleep apnea: Secondary | ICD-10-CM | POA: Diagnosis not present

## 2019-05-24 DIAGNOSIS — F259 Schizoaffective disorder, unspecified: Secondary | ICD-10-CM | POA: Diagnosis not present

## 2019-05-24 DIAGNOSIS — E785 Hyperlipidemia, unspecified: Secondary | ICD-10-CM | POA: Diagnosis not present

## 2019-05-24 DIAGNOSIS — Z8673 Personal history of transient ischemic attack (TIA), and cerebral infarction without residual deficits: Secondary | ICD-10-CM | POA: Diagnosis not present

## 2019-05-24 DIAGNOSIS — F329 Major depressive disorder, single episode, unspecified: Secondary | ICD-10-CM | POA: Diagnosis not present

## 2019-05-24 DIAGNOSIS — D631 Anemia in chronic kidney disease: Secondary | ICD-10-CM | POA: Diagnosis not present

## 2019-05-24 DIAGNOSIS — G9341 Metabolic encephalopathy: Secondary | ICD-10-CM | POA: Diagnosis not present

## 2019-05-24 DIAGNOSIS — N179 Acute kidney failure, unspecified: Secondary | ICD-10-CM | POA: Diagnosis not present

## 2019-05-24 DIAGNOSIS — T43591D Poisoning by other antipsychotics and neuroleptics, accidental (unintentional), subsequent encounter: Secondary | ICD-10-CM | POA: Diagnosis not present

## 2019-05-24 DIAGNOSIS — N183 Chronic kidney disease, stage 3 unspecified: Secondary | ICD-10-CM | POA: Diagnosis not present

## 2019-05-24 DIAGNOSIS — F419 Anxiety disorder, unspecified: Secondary | ICD-10-CM | POA: Diagnosis not present

## 2019-05-24 DIAGNOSIS — Z7902 Long term (current) use of antithrombotics/antiplatelets: Secondary | ICD-10-CM | POA: Diagnosis not present

## 2019-05-24 DIAGNOSIS — H409 Unspecified glaucoma: Secondary | ICD-10-CM | POA: Diagnosis not present

## 2019-05-25 DIAGNOSIS — N179 Acute kidney failure, unspecified: Secondary | ICD-10-CM | POA: Diagnosis not present

## 2019-05-25 DIAGNOSIS — F259 Schizoaffective disorder, unspecified: Secondary | ICD-10-CM | POA: Diagnosis not present

## 2019-05-25 DIAGNOSIS — M1711 Unilateral primary osteoarthritis, right knee: Secondary | ICD-10-CM | POA: Diagnosis not present

## 2019-05-25 DIAGNOSIS — I129 Hypertensive chronic kidney disease with stage 1 through stage 4 chronic kidney disease, or unspecified chronic kidney disease: Secondary | ICD-10-CM | POA: Diagnosis not present

## 2019-05-25 DIAGNOSIS — T43591D Poisoning by other antipsychotics and neuroleptics, accidental (unintentional), subsequent encounter: Secondary | ICD-10-CM | POA: Diagnosis not present

## 2019-05-25 DIAGNOSIS — G9341 Metabolic encephalopathy: Secondary | ICD-10-CM | POA: Diagnosis not present

## 2019-05-30 DIAGNOSIS — T43591D Poisoning by other antipsychotics and neuroleptics, accidental (unintentional), subsequent encounter: Secondary | ICD-10-CM | POA: Diagnosis not present

## 2019-05-30 DIAGNOSIS — M1711 Unilateral primary osteoarthritis, right knee: Secondary | ICD-10-CM | POA: Diagnosis not present

## 2019-05-30 DIAGNOSIS — N179 Acute kidney failure, unspecified: Secondary | ICD-10-CM | POA: Diagnosis not present

## 2019-05-30 DIAGNOSIS — G9341 Metabolic encephalopathy: Secondary | ICD-10-CM | POA: Diagnosis not present

## 2019-05-30 DIAGNOSIS — F259 Schizoaffective disorder, unspecified: Secondary | ICD-10-CM | POA: Diagnosis not present

## 2019-05-30 DIAGNOSIS — I129 Hypertensive chronic kidney disease with stage 1 through stage 4 chronic kidney disease, or unspecified chronic kidney disease: Secondary | ICD-10-CM | POA: Diagnosis not present

## 2019-06-01 DIAGNOSIS — M1711 Unilateral primary osteoarthritis, right knee: Secondary | ICD-10-CM | POA: Diagnosis not present

## 2019-06-01 DIAGNOSIS — F259 Schizoaffective disorder, unspecified: Secondary | ICD-10-CM | POA: Diagnosis not present

## 2019-06-01 DIAGNOSIS — T43591D Poisoning by other antipsychotics and neuroleptics, accidental (unintentional), subsequent encounter: Secondary | ICD-10-CM | POA: Diagnosis not present

## 2019-06-01 DIAGNOSIS — G9341 Metabolic encephalopathy: Secondary | ICD-10-CM | POA: Diagnosis not present

## 2019-06-01 DIAGNOSIS — I129 Hypertensive chronic kidney disease with stage 1 through stage 4 chronic kidney disease, or unspecified chronic kidney disease: Secondary | ICD-10-CM | POA: Diagnosis not present

## 2019-06-01 DIAGNOSIS — N179 Acute kidney failure, unspecified: Secondary | ICD-10-CM | POA: Diagnosis not present

## 2019-06-02 ENCOUNTER — Encounter: Payer: Self-pay | Admitting: Psychiatry

## 2019-06-02 ENCOUNTER — Other Ambulatory Visit: Payer: Self-pay

## 2019-06-02 ENCOUNTER — Ambulatory Visit (INDEPENDENT_AMBULATORY_CARE_PROVIDER_SITE_OTHER): Payer: Medicare Other | Admitting: Psychiatry

## 2019-06-02 DIAGNOSIS — F5101 Primary insomnia: Secondary | ICD-10-CM | POA: Diagnosis not present

## 2019-06-02 DIAGNOSIS — F259 Schizoaffective disorder, unspecified: Secondary | ICD-10-CM | POA: Diagnosis not present
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
## 2019-06-06 DIAGNOSIS — F259 Schizoaffective disorder, unspecified: Secondary | ICD-10-CM | POA: Diagnosis not present

## 2019-06-06 DIAGNOSIS — T43591D Poisoning by other antipsychotics and neuroleptics, accidental (unintentional), subsequent encounter: Secondary | ICD-10-CM | POA: Diagnosis not present

## 2019-06-06 DIAGNOSIS — M1711 Unilateral primary osteoarthritis, right knee: Secondary | ICD-10-CM | POA: Diagnosis not present

## 2019-06-06 DIAGNOSIS — G9341 Metabolic encephalopathy: Secondary | ICD-10-CM | POA: Diagnosis not present

## 2019-06-06 DIAGNOSIS — I129 Hypertensive chronic kidney disease with stage 1 through stage 4 chronic kidney disease, or unspecified chronic kidney disease: Secondary | ICD-10-CM | POA: Diagnosis not present

## 2019-06-06 DIAGNOSIS — N179 Acute kidney failure, unspecified: Secondary | ICD-10-CM | POA: Diagnosis not present

## 2019-06-07 DIAGNOSIS — N189 Chronic kidney disease, unspecified: Secondary | ICD-10-CM | POA: Diagnosis not present

## 2019-06-07 DIAGNOSIS — F259 Schizoaffective disorder, unspecified: Secondary | ICD-10-CM | POA: Diagnosis not present

## 2019-06-07 DIAGNOSIS — I129 Hypertensive chronic kidney disease with stage 1 through stage 4 chronic kidney disease, or unspecified chronic kidney disease: Secondary | ICD-10-CM | POA: Diagnosis not present

## 2019-06-07 DIAGNOSIS — D631 Anemia in chronic kidney disease: Secondary | ICD-10-CM | POA: Diagnosis not present

## 2019-06-07 DIAGNOSIS — N1832 Chronic kidney disease, stage 3b: Secondary | ICD-10-CM | POA: Diagnosis not present

## 2019-06-07 DIAGNOSIS — N251 Nephrogenic diabetes insipidus: Secondary | ICD-10-CM | POA: Diagnosis not present

## 2019-06-22 DIAGNOSIS — N179 Acute kidney failure, unspecified: Secondary | ICD-10-CM | POA: Diagnosis not present

## 2019-06-22 DIAGNOSIS — M1711 Unilateral primary osteoarthritis, right knee: Secondary | ICD-10-CM | POA: Diagnosis not present

## 2019-06-22 DIAGNOSIS — F259 Schizoaffective disorder, unspecified: Secondary | ICD-10-CM | POA: Diagnosis not present

## 2019-06-22 DIAGNOSIS — T43591D Poisoning by other antipsychotics and neuroleptics, accidental (unintentional), subsequent encounter: Secondary | ICD-10-CM | POA: Diagnosis not present

## 2019-06-22 DIAGNOSIS — I129 Hypertensive chronic kidney disease with stage 1 through stage 4 chronic kidney disease, or unspecified chronic kidney disease: Secondary | ICD-10-CM | POA: Diagnosis not present

## 2019-06-22 DIAGNOSIS — G9341 Metabolic encephalopathy: Secondary | ICD-10-CM | POA: Diagnosis not present

## 2019-06-23 ENCOUNTER — Encounter: Payer: Self-pay | Admitting: Psychiatry

## 2019-06-23 ENCOUNTER — Ambulatory Visit (INDEPENDENT_AMBULATORY_CARE_PROVIDER_SITE_OTHER): Payer: Medicare Other | Admitting: Psychiatry

## 2019-06-23 ENCOUNTER — Other Ambulatory Visit: Payer: Self-pay

## 2019-06-23 DIAGNOSIS — F5101 Primary insomnia: Secondary | ICD-10-CM | POA: Diagnosis not present

## 2019-06-23 DIAGNOSIS — F259 Schizoaffective disorder, unspecified: Secondary | ICD-10-CM | POA: Diagnosis not present

## 2019-06-23 DIAGNOSIS — T43591D Poisoning by other antipsychotics and neuroleptics, accidental (unintentional), subsequent encounter: Secondary | ICD-10-CM | POA: Diagnosis not present

## 2019-06-23 IMAGING — DX DG CHEST 1V PORT
1 series · 1 of 1 positions shown · non-contrast
Comparison: Radiograph January 13, 2018.

CLINICAL DATA: Respiratory failure.

EXAM:
PORTABLE CHEST 1 VIEW

[chest]
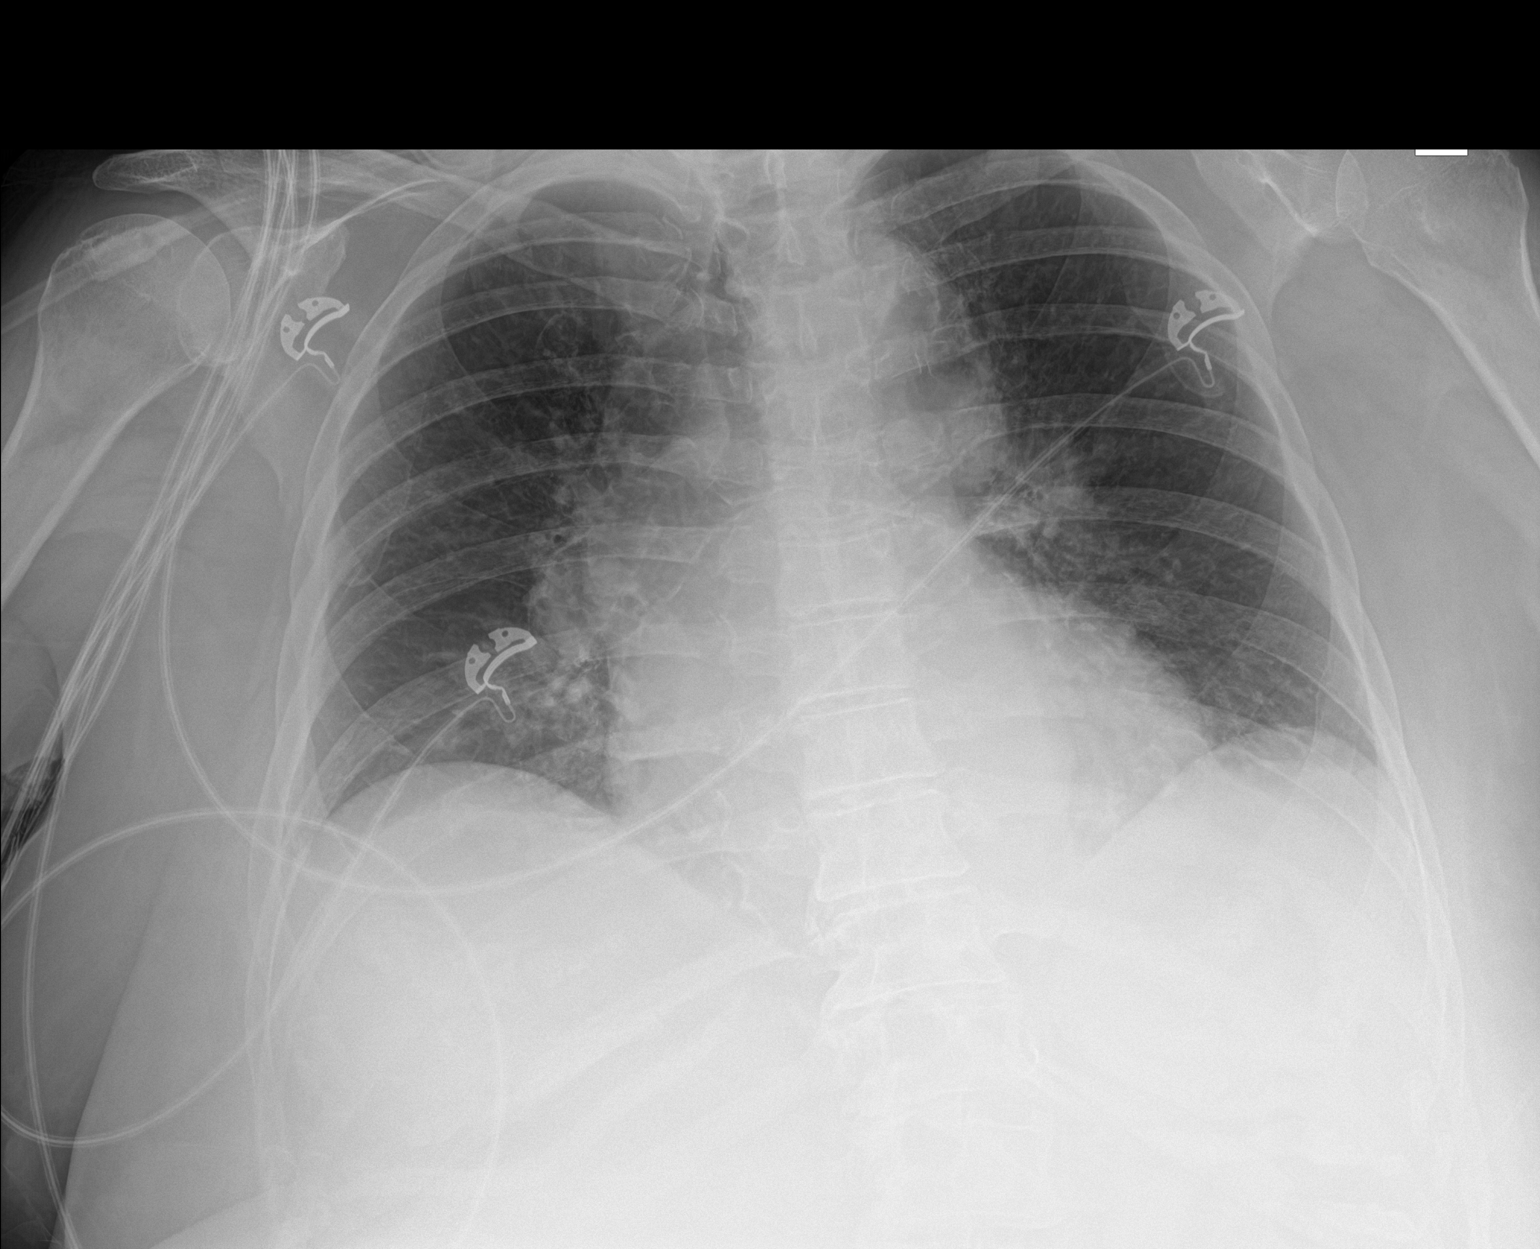

[1 of 1 positions shown; findings below may reference images not displayed]

FINDINGS: Stable cardiomediastinal silhouette. No pneumothorax or pleural
effusion is noted. Both lungs are clear. The visualized skeletal
structures are unremarkable.
IMPRESSION: No active disease.

## 2019-06-23 MED ORDER — FLUPHENAZINE HCL 1 MG PO TABS: 4.0000 mg | ORAL_TABLET | Freq: Every day | ORAL | 0 refills | Status: AC

## 2019-06-23 MED ORDER — TRAZODONE HCL 50 MG PO TABS: 50.0000 mg | ORAL_TABLET | Freq: Every day | ORAL | 1 refills | Status: AC
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
## 2019-06-27 ENCOUNTER — Emergency Department (HOSPITAL_COMMUNITY): Payer: Medicare Other

## 2019-06-27 ENCOUNTER — Inpatient Hospital Stay (HOSPITAL_COMMUNITY)
Admission: EM | Admit: 2019-06-27 | Discharge: 2019-06-30 | DRG: 564 | Disposition: A | Discharge status: Skilled Nursing Facility | Payer: Medicare Other | Attending: Internal Medicine | Admitting: Internal Medicine

## 2019-06-27 ENCOUNTER — Emergency Department (HOSPITAL_BASED_OUTPATIENT_CLINIC_OR_DEPARTMENT_OTHER)
Admit: 2019-06-27 | Discharge: 2019-06-27 | Disposition: A | Discharge status: Home / Self Care | Payer: Medicare Other | Attending: Emergency Medicine | Admitting: Emergency Medicine

## 2019-06-27 ENCOUNTER — Other Ambulatory Visit: Payer: Self-pay

## 2019-06-27 ENCOUNTER — Encounter (HOSPITAL_COMMUNITY): Payer: Self-pay | Admitting: Internal Medicine

## 2019-06-27 DIAGNOSIS — Z8041 Family history of malignant neoplasm of ovary: Secondary | ICD-10-CM | POA: Diagnosis not present

## 2019-06-27 DIAGNOSIS — M25561 Pain in right knee: Secondary | ICD-10-CM | POA: Diagnosis not present

## 2019-06-27 DIAGNOSIS — Z66 Do not resuscitate: Secondary | ICD-10-CM | POA: Diagnosis present

## 2019-06-27 DIAGNOSIS — Z7902 Long term (current) use of antithrombotics/antiplatelets: Secondary | ICD-10-CM

## 2019-06-27 DIAGNOSIS — G8929 Other chronic pain: Secondary | ICD-10-CM | POA: Diagnosis present

## 2019-06-27 DIAGNOSIS — R410 Disorientation, unspecified: Secondary | ICD-10-CM | POA: Diagnosis not present

## 2019-06-27 DIAGNOSIS — Z8673 Personal history of transient ischemic attack (TIA), and cerebral infarction without residual deficits: Secondary | ICD-10-CM | POA: Diagnosis not present

## 2019-06-27 DIAGNOSIS — R4182 Altered mental status, unspecified: Secondary | ICD-10-CM | POA: Diagnosis present

## 2019-06-27 DIAGNOSIS — Z20822 Contact with and (suspected) exposure to covid-19: Secondary | ICD-10-CM | POA: Diagnosis present

## 2019-06-27 DIAGNOSIS — R54 Age-related physical debility: Secondary | ICD-10-CM | POA: Diagnosis present

## 2019-06-27 DIAGNOSIS — S8991XA Unspecified injury of right lower leg, initial encounter: Secondary | ICD-10-CM | POA: Diagnosis not present

## 2019-06-27 DIAGNOSIS — W19XXXA Unspecified fall, initial encounter: Secondary | ICD-10-CM | POA: Diagnosis present

## 2019-06-27 DIAGNOSIS — E871 Hypo-osmolality and hyponatremia: Secondary | ICD-10-CM | POA: Diagnosis present

## 2019-06-27 DIAGNOSIS — R609 Edema, unspecified: Secondary | ICD-10-CM

## 2019-06-27 DIAGNOSIS — R531 Weakness: Secondary | ICD-10-CM | POA: Diagnosis not present

## 2019-06-27 DIAGNOSIS — M2550 Pain in unspecified joint: Secondary | ICD-10-CM | POA: Diagnosis not present

## 2019-06-27 DIAGNOSIS — I10 Essential (primary) hypertension: Secondary | ICD-10-CM | POA: Diagnosis present

## 2019-06-27 DIAGNOSIS — F419 Anxiety disorder, unspecified: Secondary | ICD-10-CM | POA: Diagnosis present

## 2019-06-27 DIAGNOSIS — Z833 Family history of diabetes mellitus: Secondary | ICD-10-CM | POA: Diagnosis not present

## 2019-06-27 DIAGNOSIS — M79661 Pain in right lower leg: Secondary | ICD-10-CM | POA: Diagnosis present

## 2019-06-27 DIAGNOSIS — R52 Pain, unspecified: Secondary | ICD-10-CM | POA: Diagnosis not present

## 2019-06-27 DIAGNOSIS — Z818 Family history of other mental and behavioral disorders: Secondary | ICD-10-CM | POA: Diagnosis not present

## 2019-06-27 DIAGNOSIS — G4733 Obstructive sleep apnea (adult) (pediatric): Secondary | ICD-10-CM | POA: Diagnosis present

## 2019-06-27 DIAGNOSIS — M25461 Effusion, right knee: Principal | ICD-10-CM | POA: Diagnosis present

## 2019-06-27 DIAGNOSIS — R69 Illness, unspecified: Secondary | ICD-10-CM | POA: Diagnosis not present

## 2019-06-27 DIAGNOSIS — Z800 Family history of malignant neoplasm of digestive organs: Secondary | ICD-10-CM | POA: Diagnosis not present

## 2019-06-27 DIAGNOSIS — D509 Iron deficiency anemia, unspecified: Secondary | ICD-10-CM | POA: Diagnosis present

## 2019-06-27 DIAGNOSIS — Z7401 Bed confinement status: Secondary | ICD-10-CM | POA: Diagnosis not present

## 2019-06-27 DIAGNOSIS — N1832 Chronic kidney disease, stage 3b: Secondary | ICD-10-CM | POA: Diagnosis present

## 2019-06-27 DIAGNOSIS — R5381 Other malaise: Secondary | ICD-10-CM | POA: Diagnosis not present

## 2019-06-27 DIAGNOSIS — E785 Hyperlipidemia, unspecified: Secondary | ICD-10-CM | POA: Diagnosis present

## 2019-06-27 DIAGNOSIS — S59901A Unspecified injury of right elbow, initial encounter: Secondary | ICD-10-CM | POA: Diagnosis not present

## 2019-06-27 DIAGNOSIS — Z888 Allergy status to other drugs, medicaments and biological substances status: Secondary | ICD-10-CM

## 2019-06-27 DIAGNOSIS — I959 Hypotension, unspecified: Secondary | ICD-10-CM | POA: Diagnosis not present

## 2019-06-27 DIAGNOSIS — M1711 Unilateral primary osteoarthritis, right knee: Secondary | ICD-10-CM | POA: Diagnosis present

## 2019-06-27 DIAGNOSIS — G9341 Metabolic encephalopathy: Secondary | ICD-10-CM | POA: Diagnosis present

## 2019-06-27 DIAGNOSIS — S9032XA Contusion of left foot, initial encounter: Secondary | ICD-10-CM | POA: Diagnosis not present

## 2019-06-27 DIAGNOSIS — E1122 Type 2 diabetes mellitus with diabetic chronic kidney disease: Secondary | ICD-10-CM | POA: Diagnosis present

## 2019-06-27 DIAGNOSIS — F259 Schizoaffective disorder, unspecified: Secondary | ICD-10-CM | POA: Diagnosis present

## 2019-06-27 DIAGNOSIS — R262 Difficulty in walking, not elsewhere classified: Secondary | ICD-10-CM | POA: Diagnosis present

## 2019-06-27 DIAGNOSIS — I129 Hypertensive chronic kidney disease with stage 1 through stage 4 chronic kidney disease, or unspecified chronic kidney disease: Secondary | ICD-10-CM | POA: Diagnosis present

## 2019-06-27 DIAGNOSIS — N183 Chronic kidney disease, stage 3 unspecified: Secondary | ICD-10-CM | POA: Diagnosis present

## 2019-06-27 DIAGNOSIS — S0990XA Unspecified injury of head, initial encounter: Secondary | ICD-10-CM | POA: Diagnosis not present

## 2019-06-27 LAB — BASIC METABOLIC PANEL
Anion gap: 11 (ref 5–15)
BUN: 30 mg/dL — ABNORMAL HIGH (ref 8–23)
CO2: 18 mmol/L — ABNORMAL LOW (ref 22–32)
Calcium: 9.1 mg/dL (ref 8.9–10.3)
Chloride: 114 mmol/L — ABNORMAL HIGH (ref 98–111)
Creatinine, Ser: 1.45 mg/dL — ABNORMAL HIGH (ref 0.44–1.00)
GFR calc Af Amer: 42 mL/min — ABNORMAL LOW (ref >60)
GFR calc non Af Amer: 37 mL/min — ABNORMAL LOW (ref >60)
Glucose, Bld: 71 mg/dL (ref 70–99)
Potassium: 5.4 mmol/L — ABNORMAL HIGH (ref 3.5–5.1)
Sodium: 143 mmol/L (ref 135–145)

## 2019-06-27 LAB — CBC WITH DIFFERENTIAL/PLATELET
Abs Immature Granulocytes: 0.03 10*3/uL (ref 0.00–0.07)
Basophils Absolute: 0 10*3/uL (ref 0.0–0.1)
Basophils Relative: 0 %
Eosinophils Absolute: 0 10*3/uL (ref 0.0–0.5)
Eosinophils Relative: 0 %
HCT: 34.7 % — ABNORMAL LOW (ref 36.0–46.0)
Hemoglobin: 11 g/dL — ABNORMAL LOW (ref 12.0–15.0)
Immature Granulocytes: 0 %
Lymphocytes Relative: 16 %
Lymphs Abs: 1.2 10*3/uL (ref 0.7–4.0)
MCH: 32.2 pg (ref 26.0–34.0)
MCHC: 31.7 g/dL (ref 30.0–36.0)
MCV: 101.5 fL — ABNORMAL HIGH (ref 80.0–100.0)
Monocytes Absolute: 1 10*3/uL (ref 0.1–1.0)
Monocytes Relative: 12 %
Neutro Abs: 5.5 10*3/uL (ref 1.7–7.7)
Neutrophils Relative %: 72 %
Platelets: 107 10*3/uL — ABNORMAL LOW (ref 150–400)
RBC: 3.42 MIL/uL — ABNORMAL LOW (ref 3.87–5.11)
RDW: 14.7 % (ref 11.5–15.5)
WBC: 7.7 10*3/uL (ref 4.0–10.5)
nRBC: 0 % (ref 0.0–0.2)

## 2019-06-27 LAB — URINALYSIS, ROUTINE W REFLEX MICROSCOPIC
Bilirubin Urine: NEGATIVE
Glucose, UA: NEGATIVE mg/dL
Hgb urine dipstick: NEGATIVE
Ketones, ur: NEGATIVE mg/dL
Leukocytes,Ua: NEGATIVE
Nitrite: NEGATIVE
Protein, ur: NEGATIVE mg/dL
Specific Gravity, Urine: 1.008 (ref 1.005–1.030)
pH: 7 (ref 5.0–8.0)

## 2019-06-27 LAB — CK: Total CK: 291 U/L — ABNORMAL HIGH (ref 38–234)

## 2019-06-27 MED ORDER — ADULT MULTIVITAMIN W/MINERALS CH
1.0000 | ORAL_TABLET | Freq: Every day | ORAL | Status: DC
  Administered 2019-06-28 – 2019-06-30 (×3): 1 via ORAL
  Filled 2019-06-27 (×3): qty 1

## 2019-06-27 MED ORDER — HEPARIN SODIUM (PORCINE) 5000 UNIT/ML IJ SOLN
5000.0000 [IU] | Freq: Three times a day (TID) | INTRAMUSCULAR | Status: DC
  Administered 2019-06-28 – 2019-06-30 (×9): 5000 [IU] via SUBCUTANEOUS
  Filled 2019-06-27 (×8): qty 1

## 2019-06-27 MED ORDER — ONDANSETRON HCL 4 MG/2ML IJ SOLN: 4.0000 mg | Freq: Four times a day (QID) | INTRAMUSCULAR | Status: DC | PRN

## 2019-06-27 MED ORDER — DIVALPROEX SODIUM ER 500 MG PO TB24
1500.0000 mg | ORAL_TABLET | Freq: Every day | ORAL | Status: DC
  Administered 2019-06-27: 1500 mg via ORAL
  Filled 2019-06-27: qty 3

## 2019-06-27 MED ORDER — SODIUM CHLORIDE 0.9 % IV SOLN: INTRAVENOUS | Status: DC

## 2019-06-27 MED ORDER — SODIUM CHLORIDE 0.9 % IV BOLUS
250.0000 mL | Freq: Once | INTRAVENOUS | Status: AC
  Administered 2019-06-27: 250 mL via INTRAVENOUS

## 2019-06-27 MED ORDER — ONDANSETRON HCL 4 MG PO TABS: 4.0000 mg | ORAL_TABLET | Freq: Four times a day (QID) | ORAL | Status: DC | PRN

## 2019-06-27 MED ORDER — AMILORIDE HCL 5 MG PO TABS
5.0000 mg | ORAL_TABLET | Freq: Every day | ORAL | Status: DC
  Administered 2019-06-28 – 2019-06-30 (×3): 5 mg via ORAL
  Filled 2019-06-27 (×3): qty 1

## 2019-06-27 MED ORDER — TRAZODONE HCL 50 MG PO TABS
50.0000 mg | ORAL_TABLET | Freq: Every day | ORAL | Status: DC
  Administered 2019-06-27 – 2019-06-30 (×4): 50 mg via ORAL
  Filled 2019-06-27 (×4): qty 1

## 2019-06-27 MED ORDER — MORPHINE SULFATE (PF) 2 MG/ML IV SOLN: 2.0000 mg | INTRAVENOUS | Status: DC | PRN

## 2019-06-27 MED ORDER — ACETAMINOPHEN 500 MG PO TABS
500.0000 mg | ORAL_TABLET | Freq: Three times a day (TID) | ORAL | Status: DC | PRN
  Administered 2019-06-29: 500 mg via ORAL
  Filled 2019-06-27: qty 1

## 2019-06-27 MED ORDER — CLOPIDOGREL BISULFATE 75 MG PO TABS
75.0000 mg | ORAL_TABLET | Freq: Every day | ORAL | Status: DC
  Administered 2019-06-28 – 2019-06-30 (×3): 75 mg via ORAL
  Filled 2019-06-27 (×3): qty 1

## 2019-06-27 MED ORDER — FLUPHENAZINE HCL 1 MG PO TABS
4.0000 mg | ORAL_TABLET | Freq: Every day | ORAL | Status: DC
  Administered 2019-06-28 – 2019-06-30 (×3): 4 mg via ORAL
  Filled 2019-06-27 (×4): qty 4

## 2019-06-27 MED ORDER — VITAMIN D (ERGOCALCIFEROL) 1.25 MG (50000 UNIT) PO CAPS
50000.0000 [IU] | ORAL_CAPSULE | ORAL | Status: DC
  Administered 2019-06-28: 50000 [IU] via ORAL
  Filled 2019-06-27: qty 1

## 2019-06-27 MED ORDER — FERROUS SULFATE 325 (65 FE) MG PO TABS
325.0000 mg | ORAL_TABLET | Freq: Every day | ORAL | Status: DC
  Administered 2019-06-28 – 2019-06-30 (×3): 325 mg via ORAL
  Filled 2019-06-27 (×3): qty 1

## 2019-06-27 NOTE — ED Provider Notes (Signed)
Patient handed off to me by previous ED PA at shift change.  Please see previous note for full details.  Chief complaint: Fall around midnight at assisted living.  Was found by approximately 12 hours later.  Patient gave very accurate and clear history to initial ED PA, fall sounds mechanical.  At baseline patient walks with roller walker and needs minimal assistance at home.  Sister at bedside reports patient is very somnolent and appears tired, frequently falling asleep but thinks it may be due to being on the ground for so long and being exhausted from that.  Patient reports chronic right knee pain and right calf pain with swelling. Bruising noted throughout.   Lab work, urinalysis and CT of the knee pending at shift change.  Plan: f/u labs, CT knee, reassess mental status. If work up benign, patient at baseline consider discharge.   Physical Exam  BP 98/61 (BP Location: Left Arm)   Pulse 82   Temp 97.6 F (36.4 C) (Oral)   Resp 18   SpO2 95%   Physical Exam Constitutional:      Appearance: She is well-developed.     Comments: In hall bed, wakes up to voice. Sister at bedside.   HENT:     Head: Normocephalic.     Nose: Nose normal.  Eyes:     General: Lids are normal.  Cardiovascular:     Rate and Rhythm: Normal rate.  Pulmonary:     Effort: Pulmonary effort is normal.  Abdominal:     Palpations: Abdomen is soft.     Tenderness: There is no abdominal tenderness.  Musculoskeletal:        General: Normal range of motion.     Cervical back: Normal range of motion.  Neurological:     Mental Status: She is alert.     Comments: Oriented to name, place, event, year.  Somnolent. Falls asleep during conversation but can be easily aroused with voice.  Pleasant, smiling.   Psychiatric:        Behavior: Behavior normal.     ED Course/Procedures   Clinical Course as of Jun 26 1832  Tue Jun 27, 2019  1559 Changes suspicious for acute on chronic medial tibial plateau fracture. CT  may be helpful for further evaluation.   DG Knee Complete 4 Views Right [CG]  1559 Creatinine(!): 1.45 [CG]  1600 CK Total(!): 291 [CG]  1720 IMPRESSION: 1. No acute osseous injury of the right knee. 2. Tricompartmental osteoarthritis of the right knee most severe in the lateral patellofemoral compartment. 3. Large joint effusion.  CT Knee Right Wo Contrast [CG]  1720 Normal   Urinalysis, Routine w reflex microscopic(!) [CG]  1832 Potassium(!): 5.4 [CG]    Clinical Course User Index [CG] Kinnie Feil, PA-C    Procedures  MDM   1600: ER work up and nursing notes personally reviewed and interpreted, as above.  Suspect mild elevation in creatinine and CK secondary to mild dehydration.  Imaging reviewed remarkable for possible acute on chronic tibial plateau fracture on right knee, CT knee pending.  UA in process.  [place] negative for DVT. EKG ordered.   1722: CT knee nonacute, large joint effusion but no acute injury.  A UA is normal.  SBP in the high 90s.  Patient remains somnolent but fully oriented.  Will attempt to ambulate to determine disposition.  At baseline, patient requires minimal assistance and is pretty independent, walks with roller walker.  1834: Patient unable to ambulate despite walker,  2 person assist. Feet are not planting, slipping from under her.  Spoke to Dr Jonelle Sidle who has accepted pt   Arlean Hopping 06/27/19 1835    Lucrezia Starch, MD 06/28/19 1007
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
## 2019-06-27 NOTE — Progress Notes (Signed)
Left lower extremity venous duplex complete.  Please see CV Proc tab for preliminary results. [place], RVT 2:56 PM  06/27/2019

## 2019-06-27 NOTE — ED Triage Notes (Signed)
Pt here from [facility] living, where she had a fall last night. Went to sit down and reach for her rollator walker but missed the chair and fell to the carpeted. Did not hit head or pass out, but too weak at baseline to get up, so remained on the floor until this morning when the OT found her on the floor. Pt's family notified of pt's fall and they wanted her evaluated. Pt has no complaints, A/O x 4. On plavix.

## 2019-06-27 NOTE — ED Notes (Signed)
RN paged floor coverage d/t pt's bp and MAP
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
## 2019-06-27 NOTE — ED Notes (Signed)
Unable to do ortho vitals and ambulate patient

## 2019-06-27 NOTE — ED Notes (Signed)
Patient transported to vascular. 

## 2019-06-27 NOTE — ED Notes (Signed)
Was told by previous tech to not do orthostatic vitals as the pt is unable to stand

## 2019-06-27 NOTE — ED Notes (Signed)
Patient transported to X-ray 

## 2019-06-28 ENCOUNTER — Encounter (HOSPITAL_COMMUNITY): Payer: Self-pay

## 2019-06-28 DIAGNOSIS — I10 Essential (primary) hypertension: Secondary | ICD-10-CM

## 2019-06-28 DIAGNOSIS — R4182 Altered mental status, unspecified: Secondary | ICD-10-CM

## 2019-06-28 DIAGNOSIS — R5381 Other malaise: Secondary | ICD-10-CM

## 2019-06-28 DIAGNOSIS — W19XXXA Unspecified fall, initial encounter: Secondary | ICD-10-CM

## 2019-06-28 DIAGNOSIS — N1832 Chronic kidney disease, stage 3b: Secondary | ICD-10-CM

## 2019-06-28 LAB — COMPREHENSIVE METABOLIC PANEL
ALT: 26 U/L (ref 0–44)
AST: 28 U/L (ref 15–41)
Albumin: 2.9 g/dL — ABNORMAL LOW (ref 3.5–5.0)
Alkaline Phosphatase: 68 U/L (ref 38–126)
Anion gap: 8 (ref 5–15)
BUN: 25 mg/dL — ABNORMAL HIGH (ref 8–23)
CO2: 18 mmol/L — ABNORMAL LOW (ref 22–32)
Calcium: 8.7 mg/dL — ABNORMAL LOW (ref 8.9–10.3)
Chloride: 117 mmol/L — ABNORMAL HIGH (ref 98–111)
Creatinine, Ser: 1.42 mg/dL — ABNORMAL HIGH (ref 0.44–1.00)
GFR calc Af Amer: 44 mL/min — ABNORMAL LOW (ref >60)
GFR calc non Af Amer: 38 mL/min — ABNORMAL LOW (ref >60)
Glucose, Bld: 80 mg/dL (ref 70–99)
Potassium: 4 mmol/L (ref 3.5–5.1)
Sodium: 143 mmol/L (ref 135–145)
Total Bilirubin: 0.6 mg/dL (ref 0.3–1.2)
Total Protein: 5.5 g/dL — ABNORMAL LOW (ref 6.5–8.1)

## 2019-06-28 LAB — CBC
HCT: 35.6 % — ABNORMAL LOW (ref 36.0–46.0)
Hemoglobin: 11.2 g/dL — ABNORMAL LOW (ref 12.0–15.0)
MCH: 32.1 pg (ref 26.0–34.0)
MCHC: 31.5 g/dL (ref 30.0–36.0)
MCV: 102 fL — ABNORMAL HIGH (ref 80.0–100.0)
Platelets: 102 10*3/uL — ABNORMAL LOW (ref 150–400)
RBC: 3.49 MIL/uL — ABNORMAL LOW (ref 3.87–5.11)
RDW: 15 % (ref 11.5–15.5)
WBC: 5.3 10*3/uL (ref 4.0–10.5)
nRBC: 0 % (ref 0.0–0.2)

## 2019-06-28 LAB — URINE CULTURE: Culture: NO GROWTH

## 2019-06-28 LAB — SYNOVIAL CELL COUNT + DIFF, W/ CRYSTALS
Crystals, Fluid: NONE SEEN
Eosinophils-Synovial: 0 % (ref 0–1)
Lymphocytes-Synovial Fld: 3 % (ref 0–20)
Monocyte-Macrophage-Synovial Fluid: 96 % — ABNORMAL HIGH (ref 50–90)
Neutrophil, Synovial: 1 % (ref 0–25)
WBC, Synovial: 100 /mm3 (ref 0–200)

## 2019-06-28 MED ORDER — BUPIVACAINE HCL 0.5 % IJ SOLN
10.0000 mL | Freq: Once | INTRAMUSCULAR | Status: AC
  Administered 2019-06-28: 1 mL
  Filled 2019-06-28: qty 10

## 2019-06-28 MED ORDER — DIVALPROEX SODIUM ER 250 MG PO TB24
1500.0000 mg | ORAL_TABLET | Freq: Every day | ORAL | Status: DC
  Administered 2019-06-28 – 2019-06-30 (×3): 1500 mg via ORAL
  Filled 2019-06-28 (×3): qty 6

## 2019-06-28 MED ORDER — METHYLPREDNISOLONE ACETATE 80 MG/ML IJ SUSP
80.0000 mg | Freq: Once | INTRAMUSCULAR | Status: AC
  Administered 2019-06-28: 80 mg via INTRA_ARTICULAR
  Filled 2019-06-28: qty 1
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
## 2019-06-28 NOTE — Progress Notes (Signed)
Patient's sister in at bedside, stayed all night.  Signed consent form, given verbal permission by patient.  Patient is unable to sign for herself.

## 2019-06-28 NOTE — Plan of Care (Signed)

## 2019-06-28 NOTE — Progress Notes (Signed)
PROGRESS NOTE  Bridget Jackson [id number] DOB: 1950-11-16 DOA: 06/27/2019 PCP: Kathyrn Lass, MD   LOS: 1 day   Brief Narrative / Interim history: 69 year old female with history of schizoaffective disorder, recently had her psychiatric medication changed due to lithium toxicity, history of right knee arthritis, OSA, DM2, hyperlipidemia, HTN, OSA, prior CVA who was brought in from assisted living facility by her sister as she was found on the floor.  Patient tells me that she fell and was laying on the ground, however did not want to alert anybody as she did not want to bother anybody.  Her right knee has been hurting pretty badly recently and she is unable to put any weight on her legs.  Subjective / 24h Interval events: In bed this morning, complains of right knee pain.  No chest pain, no shortness of breath.  Assessment & Plan: Principal Problem Right knee osteoarthritis/knee effusion-patient sister who is at bedside tells me that she has been evaluated as an outpatient on several occasions considered for knee replacement.  Given significant swelling I have consulted orthopedic surgery,?  Whether she would benefit from arthrocentesis.  If they do so will probably need fluid studies.  Clinically no suspicion of septic arthritis  Active Problems Acute metabolic encephalopathy-lethargic admission,?  Related to home medications.  She is stable now.  Schizoaffective disorder-used to be on lithium but then was transitioned to Depakote, continue fluphenazine as well.  Trazodone nightly  Generalized debility-PT/OT consultation, may need SNF level of care for short-term rehab given acute knee effusion  Prior CVA-continue Plavix  Iron deficiency anemia-continue iron supplements, hemoglobin overall stable  Essential hypertension-continue amiloride  Scheduled Meds:  aMILoride  5 mg Oral Daily   bupivacaine  10 mL Infiltration Once   clopidogrel  75 mg Oral Daily   divalproex  1,500 mg  Oral QHS   ferrous sulfate  325 mg Oral Q breakfast   fluPHENAZine  4 mg Oral QHS   heparin  5,000 Units Subcutaneous Q8H   methylPREDNISolone acetate  80 mg Intra-articular Once   multivitamin with minerals  1 tablet Oral Daily   traZODone  50 mg Oral QHS   Vitamin D (Ergocalciferol)  50,000 Units Oral Q Wed   Continuous Infusions:  sodium chloride 100 mL/hr at 06/28/19 0947   PRN Meds:.acetaminophen, morphine injection, ondansetron **OR** ondansetron (ZOFRAN) IV  DVT prophylaxis: SCDs Code Status: DNR Family Communication: sister at bedside   Status is: Inpatient  Remains inpatient appropriate because:Unsafe d/c plan, unable to function at the prior level due to inability to walk.  PT/OT/orthopedic surgery consults pending   Dispo: The patient is from: ALF              Anticipated d/c is to: SNF              Anticipated d/c date is: 2 days              Patient currently is not medically stable to d/c.  Consultants:  Orthopedic surgery  Procedures:  None   Microbiology  None   Antimicrobials: None     Objective: Vitals:   06/27/19 2236 06/27/19 2320 06/28/19 0510 06/28/19 0732  BP: (!) 96/53 (!) 114/58 95/62 (!) 102/53  Pulse:  72 77 76  Resp:  14 15 14   Temp:  (!) 97.4 F (36.3 C) 97.6 F (36.4 C) (!) 97.2 F (36.2 C)  TempSrc:  Oral Oral Oral  SpO2:  99% 100% 96%    Intake/Output Summary (Last  24 hours) at 06/28/2019 1059 Last data filed at 06/28/2019 0300 Gross per 24 hour  Intake --  Output 200 ml  Net -200 ml   There were no vitals filed for this visit.  Examination:  Constitutional: NAD Eyes: no scleral icterus ENMT: Mucous membranes are moist.  Neck: normal, supple Respiratory: clear to auscultation bilaterally, no wheezing, no crackles.  Cardiovascular: Regular rate and rhythm, no murmurs / rubs / gallops. No LE edema.  Abdomen: non distended, no tenderness. Bowel sounds positive.  Musculoskeletal: no clubbing / cyanosis.  Right  knee slightly tender to touch, no redness/warmth noted, enlarged with effusion present Skin: no rashes Neurologic: No focal deficits  Data Reviewed: I have independently reviewed following labs and imaging studies   CBC: Recent Labs  Lab 06/27/19 1535 06/28/19 0441  WBC 7.7 5.3  NEUTROABS 5.5  --   HGB 11.0* 11.2*  HCT 34.7* 35.6*  MCV 101.5* 102.0*  PLT 107* [id number]*   Basic Metabolic Panel: Recent Labs  Lab 06/27/19 1431 06/28/19 0441  NA 143 143  K 5.4* 4.0  CL 114* 117*  CO2 18* 18*  GLUCOSE 71 80  BUN 30* 25*  CREATININE 1.45* 1.42*  CALCIUM 9.1 8.7*   Liver Function Tests: Recent Labs  Lab 06/28/19 0441  AST 28  ALT 26  ALKPHOS 68  BILITOT 0.6  PROT 5.5*  ALBUMIN 2.9*   Coagulation Profile: No results for input(s): INR, PROTIME in the last 168 hours. HbA1C: No results for input(s): HGBA1C in the last 72 hours. CBG: No results for input(s): GLUCAP in the last 168 hours.  No results found for this or any previous visit (from the past 240 hour(s)).   Radiology Studies: DG Elbow Complete Right  Result Date: 06/27/2019 CLINICAL DATA:  Recent fall with elbow pain, initial encounter EXAM: RIGHT ELBOW - COMPLETE 3+ VIEW COMPARISON:  None. FINDINGS: There is no evidence of fracture, dislocation, or joint effusion. There is no evidence of arthropathy or other focal bone abnormality. Soft tissues are unremarkable. IMPRESSION: No acute abnormality noted. Electronically Signed   By: Inez Catalina M.D.   On: 06/27/2019 13:46   CT Head Wo Contrast  Result Date: 06/27/2019 CLINICAL DATA:  [place] last evening. Apparently down for approximately 12 hours. Lethargic. EXAM: CT HEAD WITHOUT CONTRAST TECHNIQUE: Contiguous axial images were obtained from the base of the skull through the vertex without intravenous contrast. COMPARISON:  04/16/2019 FINDINGS: Brain: Stable age advanced cerebral atrophy, ventriculomegaly and periventricular white matter disease. Stable cystic area in  the left lateral basal ganglia region possibly a dilated perivascular space or choroid fissure cyst. No extra-axial fluid collections are identified. No CT findings for acute hemispheric infarction or intracranial hemorrhage. No mass lesions. The brainstem and cerebellum are normal. Vascular: Stable age advanced vascular calcifications but no aneurysm or hyperdense vessels. Skull: No skull fracture or bone lesions. Sinuses/Orbits: The paranasal sinuses and mastoid air cells are clear. The globes are intact. Other: No scalp lesions or hematoma. IMPRESSION: 1. Stable age advanced cerebral atrophy, ventriculomegaly and periventricular white matter disease. 2. No acute intracranial findings or mass lesions. Electronically Signed   By: Marijo Sanes M.D.   On: 06/27/2019 14:02   CT Knee Right Wo Contrast  Result Date: 06/27/2019 CLINICAL DATA:  Status post fall, right knee pain. EXAM: CT OF THE RIGHT KNEE WITHOUT CONTRAST TECHNIQUE: Multidetector CT imaging of the RIGHT knee was performed according to the standard protocol. Multiplanar CT image reconstructions were also generated. COMPARISON:  None. FINDINGS: Bones/Joint/Cartilage No fracture or dislocation. Normal alignment. Moderate medial femorotibial compartment joint space narrowing with subchondral sclerosis and cystic changes. Multiple bony fragments along the anteromedial peripheral aspect of the medial tibial plateau likely reflecting loose bodies versus ununited fragments from prior injury. Mild lateral femorotibial compartment joint space narrowing. Severe lateral patellofemoral compartment joint space narrowing with subchondral cystic changes in the lateral patellar facet and marginal osteophytes. Large joint effusion. No aggressive osseous lesion. Ligaments Ligaments are suboptimally evaluated by CT. Muscles and Tendons No intramuscular fluid collection or hematoma. Mild atrophy of the semimembranosus muscle. Patellar tendon and quadriceps tendon are  intact. Soft tissue No fluid collection or hematoma. No soft tissue mass. IMPRESSION: 1. No acute osseous injury of the right knee. 2. Tricompartmental osteoarthritis of the right knee most severe in the lateral patellofemoral compartment. 3. Large joint effusion. Electronically Signed   By: Kathreen Devoid   On: 06/27/2019 16:45   DG Knee Complete 4 Views Right  Result Date: 06/27/2019 CLINICAL DATA:  Recent fall with knee pain, initial encounter EXAM: RIGHT KNEE - COMPLETE 4+ VIEW COMPARISON:  01/05/2018 FINDINGS: Tricompartmental degenerative changes are noted. 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 Irregularity of the medial tibial plateau is noted with multiple linear lucencies suspicious for undisplaced tibial plateau fracture. Joint effusion is noted. Soft tissue nodule is noted laterally stable from the prior exam. IMPRESSION: Changes suspicious for acute on chronic medial tibial plateau fracture. CT may be helpful for further evaluation. Electronically Signed   By: Inez Catalina M.D.   On: 06/27/2019 13:47   DG Foot Complete Left  Result Date: 06/27/2019 CLINICAL DATA:  Fall.  Bruising. EXAM: LEFT FOOT - COMPLETE 3+ VIEW COMPARISON:  No prior. FINDINGS: No acute soft tissue bony abnormality identified. No evidence of fracture or dislocation. No radiopaque foreign body. IMPRESSION: No acute abnormality identified. Electronically Signed   By: Marcello Moores  Register   On: 06/27/2019 13:46   VAS [place] LOWER EXTREMITY VENOUS (DVT) (ONLY [facility] & [facility] 7a-7p)  Result Date: 06/27/2019  Lower Venous DVTStudy Indications: Edema.  Performing Technologist: Antonieta Pert RDMS, RVT  Examination Guidelines: A complete evaluation includes B-mode imaging, spectral Doppler, color Doppler, and power Doppler as needed of all accessible portions of each vessel. Bilateral testing is considered an integral part of a complete examination. Limited examinations for reoccurring indications may be performed  as noted. The reflux portion of the exam is performed with the patient in reverse Trendelenburg.  +-----+---------------+---------+-----------+----------+--------------+  RIGHT Compressibility Phasicity Spontaneity Properties Thrombus Aging  +-----+---------------+---------+-----------+----------+--------------+  CFV   Full            Yes       Yes                                    +-----+---------------+---------+-----------+----------+--------------+   +---------+---------------+---------+-----------+----------+--------------+  LEFT      Compressibility Phasicity Spontaneity Properties Thrombus Aging  +---------+---------------+---------+-----------+----------+--------------+  CFV       Full            Yes       Yes                                    +---------+---------------+---------+-----------+----------+--------------+  SFJ       Full                                                             +---------+---------------+---------+-----------+----------+--------------+  FV Prox   Full                                                             +---------+---------------+---------+-----------+----------+--------------+  FV Mid    Full                                                             +---------+---------------+---------+-----------+----------+--------------+  FV Distal Full                                                             +---------+---------------+---------+-----------+----------+--------------+  PFV       Full                                                             +---------+---------------+---------+-----------+----------+--------------+  POP       Full            Yes       Yes                                    +---------+---------------+---------+-----------+----------+--------------+  PTV       Full                                                             +---------+---------------+---------+-----------+----------+--------------+  PERO      Full                                                              +---------+---------------+---------+-----------+----------+--------------+  GSV       Full                                                             +---------+---------------+---------+-----------+----------+--------------+     Summary: RIGHT: - No evidence of common femoral vein obstruction.  LEFT: - There is no evidence of deep vein thrombosis in the lower extremity.  - No cystic structure found in the popliteal fossa.  *See table(s) above for measurements and observations. Electronically signed by Deitra Mayo MD on 06/27/2019 at 3:27:37  PM.    Final     Marzetta Board, MD, PhD [facility]  Between 7 am - 7 pm I am available, please contact me via Amion or Securechat  Between 7 pm - 7 am I am not available, please contact night coverage MD/APP via Amion

## 2019-06-28 NOTE — Procedures (Signed)
Procedure: Right knee aspiration and injection  Indication: Right knee effusion(s)  Surgeon: Silvestre Gunner, PA-C  Assist: None  Anesthesia: Topical refrigerant  EBL: None  Complications: None  Findings: After risks/benefits explained patient desires to undergo procedure. Consent obtained and time out performed. The right knee was sterilely prepped and aspirated. 52ml reddish fluid obtained. Pt tolerated the procedure well.    Lisette Abu, PA-C Orthopedic Surgery [phone]

## 2019-06-29 MED ORDER — SENNOSIDES-DOCUSATE SODIUM 8.6-50 MG PO TABS: 1.0000 | ORAL_TABLET | Freq: Every evening | ORAL | Status: DC | PRN

## 2019-06-29 MED ORDER — POLYETHYLENE GLYCOL 3350 17 G PO PACK
17.0000 g | PACK | Freq: Two times a day (BID) | ORAL | Status: DC
  Administered 2019-06-29: 17 g via ORAL
  Filled 2019-06-29 (×3): qty 1

## 2019-06-29 NOTE — Plan of Care (Signed)
  Problem: Education: Goal: Knowledge of General Education information will improve Description: Including pain rating scale, medication(s)/side effects and non-pharmacologic comfort measures Outcome: Progressing   Problem: Pain Managment: Goal: General experience of comfort will improve Outcome: Progressing   Problem: Safety: Goal: Ability to remain free from injury will improve Outcome: Progressing   

## 2019-06-29 NOTE — Plan of Care (Signed)

## 2019-06-29 NOTE — Evaluation (Signed)
Physical Therapy Evaluation Patient Details Name: Bridget Jackson MRN: [id number] DOB: [birth year] Today's Date: 06/29/2019   History of Present Illness  69 y.o. female admitted on 06/27/19 post fall and inability to put weight on legs. CT R knee impression clear for acute osseous injury, tricompartmental osteoarthritis, large joint effusion. Right knee aspiration and injection 06/28/19. PMH schizoaffective disorder, history of right knee arthritis, obstructive sleep apnea, diabetes, hyperlipidemia, hyponatremia, hypertension, previous lithium toxicity and obstructive sleep apnea, previous history of CVA.  Clinical Impression  Pt presents with an overall decrease in functional mobility, generalized weakness, pain and decreased balance secondary to above. PTA, pt lived alone in independent living facility. Educ on LE therex to complete while in room to increase LE strength. Today, pt able to complete bed mobility mod(A)+2, sit to stand min(A)+2, transfer to bedside commode and recliner mod(A) +2 RW. Pt required verbal and tactile cues as well as increased time for all mobility. Discussed d/c recommendations for skilled nursing facility and pt agreed.  Pt would benefit from continued acute PT services to maximize functional mobility and independence prior to d/c to next venue of care.     Follow Up Recommendations SNF    Equipment Recommendations  None recommended by PT    Recommendations for Other Services       Precautions / Restrictions Precautions Precautions: Fall Restrictions Weight Bearing Restrictions: No      Mobility  Bed Mobility Overal bed mobility: Needs Assistance Bed Mobility: Supine to Sit     Supine to sit: Mod assist;+2 for safety/equipment     General bed mobility comments: verbal and tactile cues to initiate movement, and assist to move LEs off bed, scoot hips and lift trunk  Transfers Overall transfer level: Needs assistance Equipment used: Rolling walker (2  wheeled) Transfers: Sit to/from Stand Sit to Stand: Min assist;+2 safety/equipment            Ambulation/Gait Ambulation/Gait assistance: Mod assist;+2 safety/equipment Gait Distance (Feet): 5 Feet Assistive device: Rolling walker (2 wheeled) Gait Pattern/deviations: Step-to pattern;Wide base of support;Decreased stride length Gait velocity: decreased   General Gait Details: required mod(A)+2 safety with transfer from bsc to chair.  Stairs            Wheelchair Mobility    Modified Rankin (Stroke Patients Only)       Balance Overall balance assessment: Needs assistance Sitting-balance support: Bilateral upper extremity supported;Feet supported Sitting balance-Leahy Scale: Fair       Standing balance-Leahy Scale: Poor Standing balance comment: Pt able to stand with RW, pt required RW to transfer to bedside commode and to recliner                             Pertinent Vitals/Pain Pain Assessment: Faces Faces Pain Scale: Hurts little more Pain Location: bil. LEs  Pain Descriptors / Indicators: Grimacing;Guarding Pain Intervention(s): Monitored during session;Limited activity within patient's tolerance    Home Living Family/patient expects to be discharged to:: Private residence Living Arrangements: Alone Available Help at Discharge: Family;Available PRN/intermittently Type of Home: Apartment Home Access: Level entry     Home Layout: One level Home Equipment: Walker - 4 wheels;Grab bars - toilet;Grab bars - tub/shower;Shower seat      Prior Function Level of Independence: Independent with assistive device(s)   Gait / Transfers Assistance Needed: Mod indep household ambulator with rollator     Comments: Pt reports she was mod I with ADLs using RW  Hand Dominance   Dominant Hand: Right    Extremity/Trunk Assessment   Upper Extremity Assessment Upper Extremity Assessment: Defer to OT evaluation    Lower Extremity  Assessment Lower Extremity Assessment: Generalized weakness;RLE deficits/detail;LLE deficits/detail RLE Deficits / Details: Grossly 3/5 bilateral LE, Hip flexion 3+/5 bilaterally RLE Sensation: WNL LLE Deficits / Details: Grossly 3/5 bilateral LE, Hip flexion 3+/5 bilaterally LLE Sensation: WNL    Cervical / Trunk Assessment Cervical / Trunk Assessment: Kyphotic  Communication   Communication: No difficulties  Cognition Arousal/Alertness: Awake/alert Behavior During Therapy: WFL for tasks assessed/performed;Flat affect Overall Cognitive Status: No family/caregiver present to determine baseline cognitive functioning Area of Impairment: Following commands;Safety/judgement;Awareness                       Following Commands: Follows one step commands consistently;Follows multi-step commands with increased time;Follows one step commands with increased time;Follows multi-step commands inconsistently Safety/Judgement: Decreased awareness of deficits     General Comments: Pt slow to respond, and requires min cues for problem solving and sequencing       General Comments General comments (skin integrity, edema, etc.): Pt required increased time for movement and to answer questions. Pt reports her sister handles her grocery runs and medical care.    Exercises Total Joint Exercises Ankle Circles/Pumps: AROM;5 reps;Both;Seated Long Arc Quad: AROM;5 reps;Both;Seated General Exercises - Lower Extremity Hip Flexion/Marching: AROM;Both;Seated;5 reps   Assessment/Plan    PT Assessment Patient needs continued PT services  PT Problem List Decreased strength;Decreased mobility;Decreased safety awareness;Decreased range of motion;Decreased activity tolerance;Cardiopulmonary status limiting activity;Decreased cognition;Decreased balance;Decreased knowledge of use of DME;Pain       PT Treatment Interventions DME instruction;Therapeutic activities;Gait training;Therapeutic  exercise;Patient/family education;Balance training;Functional mobility training    PT Goals (Current goals can be found in the Care Plan section)  Acute Rehab PT Goals Patient Stated Goal: Home PT Goal Formulation: With patient Time For Goal Achievement: 07/13/19 Potential to Achieve Goals: Fair    Frequency Min 2X/week   Barriers to discharge Decreased caregiver support Pt reports she lives alone at independent living facility, family not present to determine further caregiver support    Co-evaluation PT/OT/SLP Co-Evaluation/Treatment: Yes Reason for Co-Treatment: Necessary to address cognition/behavior during functional activity;For patient/therapist safety PT goals addressed during session: Mobility/safety with mobility;Proper use of DME OT goals addressed during session: ADL's and self-care       AM-PAC PT "6 Clicks" Mobility  Outcome Measure Help needed turning from your back to your side while in a flat bed without using bedrails?: None Help needed moving from lying on your back to sitting on the side of a flat bed without using bedrails?: A Lot Help needed moving to and from a bed to a chair (including a wheelchair)?: A Lot Help needed standing up from a chair using your arms (e.g., wheelchair or bedside chair)?: A Little Help needed to walk in hospital room?: A Lot Help needed climbing 3-5 steps with a railing? : Total 6 Click Score: 14    End of Session Equipment Utilized During Treatment: Gait belt Activity Tolerance: Patient tolerated treatment well Patient left: in chair;with call bell/phone within reach;with chair alarm set Nurse Communication: Mobility status PT Visit Diagnosis: Unsteadiness on feet (R26.81);Pain Pain - part of body: Leg(Bilateral LE)    Time: [id number] PT Time Calculation (min) (ACUTE ONLY): 30 min   Charges:   PT Evaluation $PT Eval Moderate Complexity: 1 Mod          Rolland Porter  SPT 06/29/2019   Rolland Porter 06/29/2019, 2:27  PM

## 2019-06-29 NOTE — TOC Initial Note (Addendum)
Transition of Care [facility]) - Initial/Assessment Note    Patient Details  Name: Bridget Jackson MRN: [id number] Date of Birth: 02/07/1950  Transition [facility] Care [facility]) CM/SW Contact:    Sharin Mons, RN Phone Number: [phone] 06/29/2019, 4:21 PM  Clinical Narrative:    Admitted with AMS. Hx of schizoaffective disorder, R knee arthritis, OSA, diabetes, hyperlipidemia, hyponatremia, hypertension, lithium toxicity and CVA. NCM @ bedside with pt to discuss POC/ TOC needs.... Pt gave NCM permission and requested NCM  to speak with sister concerning needs. NCM called and spoke with [name]( sister).   [name] stated pt resides @ the [facility], ILF.and PTA  independent with ADL's.  Ambulates with a walker.          Posy Savko (Sister)     [phone]       NCM received consult for possible SNF placement at time of discharge. NCM spoke with sister  regarding PT recommendation of SNF placement at time of discharge. Sister reported that patient's is currently unable to care for self at independently  @ the [facility] given patient's current physical needs and fall risk. Sister expressed understanding of PT recommendation and states she and her sister are agreeable to SNF placement at time of discharge. Reports preference for [facility]. NCM discussed insurance authorization process and provided Medicare SNF ratings list, left @ bedside.No further questions reported at this time. NCM to continue to follow and assist with discharge planning needs. Pt fully vaccinated.   Expected Discharge Plan: Skilled Nursing Facility Barriers to Discharge: Continued Medical Work up   Patient Goals and CMS Choice Patient states their goals for this hospitalization and ongoing recovery are:: to get better CMS Medicare.gov Compare Post Acute Care list provided to:: Patient Represenative (must comment)(sister Debbie)    Expected Discharge Plan and Services Expected Discharge Plan: [place]    Discharge Planning Services: CM Consult   Living arrangements for the past 2 months: Independent Living Facility([facility])                                      Prior Living Arrangements/Services Living arrangements for the past 2 months: Independent Living Estate manager/land agent) Lives with:: Self Patient language and need for interpreter reviewed:: Yes Do you feel safe going back to the place where you live?: Yes      Need for Family Participation in Patient Care: Yes (Comment) Care giver support system in place?: Yes (comment)   Criminal Activity/Legal Involvement Pertinent to Current Situation/Hospitalization: No - Comment as needed  Activities of Daily Living Home Assistive Devices/Equipment: Walker (specify type)(Rolling) ADL Screening (condition at time of admission) Patient's cognitive ability adequate to safely complete daily activities?: No Is the patient deaf or have difficulty hearing?: No Does the patient have difficulty seeing, even when wearing glasses/contacts?: No Does the patient have difficulty concentrating, remembering, or making decisions?: Yes Patient able to express need for assistance with ADLs?: Yes Does the patient have difficulty dressing or bathing?: Yes Independently performs ADLs?: No Communication: Independent Dressing (OT): Needs assistance Is this a change from baseline?: Pre-admission baseline Grooming: Independent Feeding: Independent Bathing: Needs assistance Is this a change from baseline?: Pre-admission baseline Toileting: Needs assistance Is this a change from baseline?: Pre-admission baseline In/Out Bed: Needs assistance Is this a change from baseline?: Pre-admission baseline Walks in Home: Needs assistance Is this a change from baseline?: Pre-admission baseline Does the patient have difficulty  walking or climbing stairs?: Yes Weakness of Legs: Both Weakness of Arms/Hands: Both  Permission Sought/Granted Permission  sought to share information with : Case Manager Permission granted to share information with : Yes, Verbal Permission Granted  Share Information with NAME: Normalinda Noblitt [phone]           Emotional Assessment Appearance:: Appears stated age Attitude/Demeanor/Rapport: Gracious Affect (typically observed): Accepting Orientation: : Oriented to Self, Oriented to Place, Oriented to  Time, Oriented to Situation Alcohol / Substance Use: Not Applicable Psych Involvement: No (comment)  Admission diagnosis:  Generalized weakness [R53.1] Fall, initial encounter [W19.XXXA] AMS (altered mental status) [R41.82] Patient Active Problem List   Diagnosis Date Noted  . AMS (altered mental status) 06/27/2019  . Localized osteoarthritis of right knee 06/27/2019  . 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 CKD (chronic kidney disease), stage III 04/22/2019  . Elevated TSH 04/19/2019  . Hypernatremia 04/18/2019  . UTI (urinary tract infection) 04/17/2019  . Acute metabolic encephalopathy 99991111  . T wave inversion in electrocardiogram 03/17/2018  . Lithium toxicity 03/17/2018  . Nystagmus 03/17/2018  . AKI (acute kidney injury) ([place]) 03/17/2018  . Normal anion gap metabolic acidosis 99991111  . Hyperkalemia 03/17/2018  . Altered mental status   . Hypotension   . 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 CPAP ventilation treatment not tolerated 11/17/2017  . Excessive daytime sleepiness 11/05/2017  . Severe sleep apnea 11/05/2017  . Left shoulder pain 08/31/2017  . Pyuria 08/30/2017  . Debility 08/30/2017  . Schizoaffective disorder ([place]) 08/30/2017  . Hyperlipidemia 08/30/2017  . Essential hypertension 08/30/2017   PCP:  Kathyrn Lass, MD Pharmacy:   [facility] [place], [place] - [address] AT [address] [place] [address] Phone: [phone] Fax: [phone]     Social Determinants of Health (SDOH)  Interventions    Readmission Risk Interventions Readmission Risk Prevention Plan 04/18/2019  Post Dischage Appt Not Complete  Appt Comments pending medical stability  Medication Screening Complete  Transportation Screening Complete  Some recent data might be hidden

## 2019-06-29 NOTE — Progress Notes (Signed)
PROGRESS NOTE  Bridget Jackson [id number] DOB: 11/19/1950 DOA: 06/27/2019 PCP: Kathyrn Lass, MD   LOS: 2 days   Brief Narrative / Interim history: 69 year old female with history of schizoaffective disorder, recently had her psychiatric medication changed due to lithium toxicity, history of right knee arthritis, OSA, DM2, hyperlipidemia, HTN, OSA, prior CVA who was brought in from assisted living facility by her sister as she was found on the floor.  Patient tells me that she fell and was laying on the ground, however did not want to alert anybody as she did not want to bother anybody.  Her right knee has been hurting pretty badly recently and she is unable to put any weight on her legs.  Subjective / 24h Interval events: In bed this morning, complains of right knee pain.  No chest pain, no shortness of breath.  Assessment & Plan: Principal Problem Right knee osteoarthritis/knee effusion-patient sister who is at bedside tells me that she has been evaluated as an outpatient on several occasions considered for knee replacement.  Given significant swelling I have consulted orthopedic surgery, and patient underwent arthrocentesis yesterday as well as steroid injection in her right knee. Fluid studies negative for septic arthritis and no crystals were seen suggesting osteoarthritic type inflammatory changes. PT to work with her again today, suspect she may need SNF  Active Problems Acute metabolic encephalopathy-lethargic admission,?  Related to home medications. Has improved, sister who is at bedside tells me that she is at baseline  Schizoaffective disorder-used to be on lithium but then was transitioned to Depakote, continue fluphenazine as well.  Trazodone nightly  Generalized debility-PT/OT consultation pending, suspect she will need SNF  Prior CVA-continue Plavix  Iron deficiency anemia-continue iron supplements, hemoglobin overall stable  Essential hypertension-continue amiloride,  blood pressure overall stable  OSA-Per family she has not been able to tolerate CPAP  Scheduled Meds: . aMILoride  5 mg Oral Daily  . clopidogrel  75 mg Oral Daily  . divalproex  1,500 mg Oral QHS  . ferrous sulfate  325 mg Oral Q breakfast  . fluPHENAZine  4 mg Oral QHS  . heparin  5,000 Units Subcutaneous Q8H  . multivitamin with minerals  1 tablet Oral Daily  . traZODone  50 mg Oral QHS  . Vitamin D (Ergocalciferol)  50,000 Units Oral Q Wed   Continuous Infusions: . sodium chloride Stopped (06/28/19 1845)   PRN Meds:.acetaminophen, morphine injection, ondansetron **OR** ondansetron (ZOFRAN) IV  DVT prophylaxis: SCDs Code Status: DNR Family Communication: sister at bedside   Status is: Inpatient  Remains inpatient appropriate because:Unsafe d/c plan, unable to function at the prior level due to inability to walk.  PT/OT/orthopedic surgery consults pending   Dispo: The patient is from: ALF              Anticipated d/c is to: SNF              Anticipated d/c date is: 2 days              Patient currently is not medically stable to d/c.  Consultants:  Orthopedic surgery  Procedures:  None   Microbiology  None   Antimicrobials: None     Objective: Vitals:   06/28/19 1247 06/28/19 2011 06/29/19 0450 06/29/19 0755  BP: (!) 113/59 121/65 (!) 144/60 114/73  Pulse: 62 66 68 70  Resp: 16 16 16 16   Temp: (!) 97.3 F (36.3 C) (!) 97.3 F (36.3 C) (!) 97.5 F (36.4 C) (!) 97.4 F (  36.3 C)  TempSrc: Oral Oral Oral Oral  SpO2: 97% 97% 100% 98%    Intake/Output Summary (Last 24 hours) at 06/29/2019 1016 Last data filed at 06/29/2019 [id number] Gross per 24 hour  Intake 2240.13 ml  Output 1200 ml  Net 1040.13 ml   There were no vitals filed for this visit.  Examination:  Constitutional: No distress Eyes: No icterus ENMT: mmm Neck: normal, supple Respiratory: Clear bilaterally, no wheezing or crackles heard Cardiovascular: Regular rate and rhythm, no murmurs, no  edema Abdomen: Soft, nontender, nondistended, positive bowel sounds Musculoskeletal: no clubbing / cyanosis.  Right knee barely tender to touch, no redness/warmth noted, less swollen today. Overall improved Skin: No rashes seen  Data Reviewed: I have independently reviewed following labs and imaging studies   CBC: Recent Labs  Lab 06/27/19 1535 06/28/19 0441  WBC 7.7 5.3  NEUTROABS 5.5  --   HGB 11.0* 11.2*  HCT 34.7* 35.6*  MCV 101.5* 102.0*  PLT 107* [id number]*   Basic Metabolic Panel: Recent Labs  Lab 06/27/19 1431 06/28/19 0441  NA 143 143  K 5.4* 4.0  CL 114* 117*  CO2 18* 18*  GLUCOSE 71 80  BUN 30* 25*  CREATININE 1.45* 1.42*  CALCIUM 9.1 8.7*   Liver Function Tests: Recent Labs  Lab 06/28/19 0441  AST 28  ALT 26  ALKPHOS 68  BILITOT 0.6  PROT 5.5*  ALBUMIN 2.9*   Coagulation Profile: No results for input(s): INR, PROTIME in the last 168 hours. HbA1C: No results for input(s): HGBA1C in the last 72 hours. CBG: No results for input(s): GLUCAP in the last 168 hours.  Recent Results (from the past 240 hour(s))  Urine culture     Status: None   Collection Time: 06/27/19  4:01 PM   Specimen: Urine, Random  Result Value Ref Range Status   Specimen Description URINE, RANDOM  Final   Special Requests NONE  Final   Culture   Final    NO GROWTH Performed at [facility], [address], [place], [place] [address]    Report Status 06/28/2019 FINAL  Final  Body fluid culture     Status: None (Preliminary result)   Collection Time: 06/28/19  3:30 PM   Specimen: Body Fluid  Result Value Ref Range Status   Specimen Description FLUID SYNOVIAL RIGHT KNEE  Final   Special Requests NONE  Final   Gram Stain NO WBC SEEN NO ORGANISMS SEEN   Final   Culture   Final    NO GROWTH < 24 HOURS Performed at [facility], [place] [address], [place], [place] [address]    Report Status PENDING  Incomplete     Radiology Studies: No results found.  Marzetta Board, MD, PhD [facility]  Between 7 am - 7 pm I am available, please contact me via Amion or Securechat  Between 7 pm - 7 am I am not available, please contact night coverage MD/APP via Amion

## 2019-06-29 NOTE — Progress Notes (Signed)
Occupational Therapy Evaluation   Pt admitted with the below diagnosis and demonstrates the below listed deficits.  She requires min - total A for ADLs and min A-mod A +2 for functional transfers.  She lives alone in an independent living senior apartment, and was mod I with ADLs and functional mobility.  Feel she would benefit from SNF level rehab at discharge. Marland Kitchen    06/29/19 1101  OT Visit Information  Last OT Received On 06/29/19  Assistance Needed +2  PT/OT/SLP Co-Evaluation/Treatment Yes  Reason for Co-Treatment Necessary to address cognition/behavior during functional activity;For patient/therapist safety;To address functional/ADL transfers  OT goals addressed during session ADL's and self-care  History of Present Illness 69 y.o. female admitted on 06/27/19 post fall and inability to put weight on legs. CT R knee impression clear for acute osseous injury, tricompartmental osteoarthritis, large joint effusion. Right knee aspiration and injection 06/28/19. PMH schizoaffective disorder, history of right knee arthritis, obstructive sleep apnea, diabetes, hyperlipidemia, hyponatremia, hypertension, previous lithium toxicity and obstructive sleep apnea, previous history of CVA.  Precautions  Precautions Fall  Home Living  Family/patient expects to be discharged to: Private residence  Living Arrangements Alone  Available Help at Discharge Family;Available PRN/intermittently  Type of Home Apartment  Home Access Level entry  Home Layout One level  Bathroom Shower/Tub Walk-in shower  Bathroom Toilet Handicapped height  Bathroom Accessibility Yes  Home Equipment Walker - 4 wheels;Grab bars - toilet;Grab bars - tub/shower;Shower seat  Prior Function  Level of Independence Independent with assistive device(s)  Gait / Transfers Assistance Needed Mod indep household ambulator with rollator  Comments Pt reports she was mod I with ADLs using RW   Communication  Communication No difficulties  Pain  Assessment  Pain Assessment Faces  Faces Pain Scale 4  Pain Location bil. LEs   Pain Descriptors / Indicators Grimacing;Guarding  Pain Intervention(s) Monitored during session  Cognition  Arousal/Alertness Awake/alert  Behavior During Therapy WFL for tasks assessed/performed  Overall Cognitive Status No family/caregiver present to determine baseline cognitive functioning  General Comments Pt slow to respond, and requires min cues for problem solving and sequencing   Upper Extremity Assessment  Upper Extremity Assessment Generalized weakness  Lower Extremity Assessment  Lower Extremity Assessment Defer to PT evaluation  Cervical / Trunk Assessment  Cervical / Trunk Assessment Kyphotic  ADL  Overall ADL's  Needs assistance/impaired  Eating/Feeding Independent  Grooming Wash/dry hands;Wash/dry face;Oral care;Brushing hair;Set up;Sitting;Bed level  Upper Body Bathing Minimal assistance;Sitting  Lower Body Bathing Maximal assistance;Sit to/from stand  Upper Body Dressing  Minimal assistance;Sitting  Lower Body Dressing Total assistance;Sit to/from stand  Lower Body Dressing Details (indicate cue type and reason) unable to access feet for LB ADLs and unable to release UE from walker to pull pants over hips   Toilet Transfer Moderate assistance;+2 for safety/equipment;Stand-pivot;BSC;RW  Toileting- Clothing Manipulation and Hygiene Total assistance;Sit to/from stand  Functional mobility during ADLs Moderate assistance;+2 for safety/equipment;Rolling walker  Bed Mobility  Overal bed mobility Needs Assistance  Bed Mobility Supine to Sit  Supine to sit Mod assist  General bed mobility comments cues to initiate movement, and assist to move LEs off bed, scoot hips and lift trunk   Transfers  Overall transfer level Needs assistance  Equipment used Rolling walker (2 wheeled)  Transfers Sit to/from [employer] Transfers  Sit to Stand Min assist;+2 safety/equipment;+2 physical assistance   Stand pivot transfers Mod assist;+2 safety/equipment  General transfer comment assist for balance, and maneuvering RW   Balance  Overall balance assessment Needs assistance  Sitting-balance support Feet supported  Sitting balance-Leahy Scale Fair  Standing balance support Bilateral upper extremity supported  Standing balance-Leahy Scale Poor  Standing balance comment requires UE support   General Comments  General comments (skin integrity, edema, etc.) Pt requires increased time to perform all tasks   OT - End of Session  Equipment Utilized During Treatment Gait belt;Rolling walker  Activity Tolerance Patient tolerated treatment well  Patient left in chair;with call bell/phone within reach;with chair alarm set  Nurse Communication Mobility status  OT Assessment  OT Recommendation/Assessment Patient needs continued OT Services  OT Visit Diagnosis Unsteadiness on feet (R26.81)  OT Problem List Decreased strength;Decreased activity tolerance;Impaired balance (sitting and/or standing);Decreased safety awareness;Decreased knowledge of use of DME or AE;Pain  Barriers to Discharge Decreased caregiver support  OT Plan  OT Frequency (ACUTE ONLY) Min 2X/week  OT Treatment/Interventions (ACUTE ONLY) Self-care/ADL training;Therapeutic exercise;DME and/or AE instruction;Therapeutic activities;Patient/family education;Balance training  AM-PAC OT "6 Clicks" Daily Activity Outcome Measure (Version 2)  Help from another person eating meals? 4  Help from another person taking care of personal grooming? 3  Help from another person toileting, which includes using toliet, bedpan, or urinal? 2  Help from another person bathing (including washing, rinsing, drying)? 2  Help from another person to put on and taking off regular upper body clothing? 3  Help from another person to put on and taking off regular lower body clothing? 1  6 Click Score 15  OT Recommendation  Follow Up Recommendations SNF  OT  Equipment None recommended by OT  Individuals Consulted  Consulted and Agree with Results and Recommendations Patient  Acute Rehab OT Goals  Patient Stated Goal to be able to take care of self again   OT Goal Formulation With patient  Time For Goal Achievement 07/13/19  Potential to Achieve Goals Good  OT Time Calculation  OT Start Time (ACUTE ONLY) 1057  OT Stop Time (ACUTE ONLY) 1128  OT Time Calculation (min) 31 min  OT General Charges  $OT Visit 1 Visit  OT Evaluation  $OT Eval Moderate Complexity 1 Mod  Written Expression  Dominant Hand Right  Nilsa Nutting., OTR/L Acute Rehabilitation Services Pager [phone] Office [phone]

## 2019-06-30 DIAGNOSIS — R531 Weakness: Secondary | ICD-10-CM

## 2019-06-30 LAB — CBC
HCT: 37.3 % (ref 36.0–46.0)
Hemoglobin: 11.8 g/dL — ABNORMAL LOW (ref 12.0–15.0)
MCH: 32.5 pg (ref 26.0–34.0)
MCHC: 31.6 g/dL (ref 30.0–36.0)
MCV: 102.8 fL — ABNORMAL HIGH (ref 80.0–100.0)
Platelets: 111 10*3/uL — ABNORMAL LOW (ref 150–400)
RBC: 3.63 MIL/uL — ABNORMAL LOW (ref 3.87–5.11)
RDW: 14.6 % (ref 11.5–15.5)
WBC: 4.9 10*3/uL (ref 4.0–10.5)
nRBC: 0 % (ref 0.0–0.2)

## 2019-06-30 LAB — BASIC METABOLIC PANEL
Anion gap: 5 (ref 5–15)
BUN: 32 mg/dL — ABNORMAL HIGH (ref 8–23)
CO2: 19 mmol/L — ABNORMAL LOW (ref 22–32)
Calcium: 8.8 mg/dL — ABNORMAL LOW (ref 8.9–10.3)
Chloride: 116 mmol/L — ABNORMAL HIGH (ref 98–111)
Creatinine, Ser: 1.4 mg/dL — ABNORMAL HIGH (ref 0.44–1.00)
GFR calc Af Amer: 44 mL/min — ABNORMAL LOW (ref >60)
GFR calc non Af Amer: 38 mL/min — ABNORMAL LOW (ref >60)
Glucose, Bld: 91 mg/dL (ref 70–99)
Potassium: 4.8 mmol/L (ref 3.5–5.1)
Sodium: 140 mmol/L (ref 135–145)

## 2019-06-30 LAB — SARS CORONAVIRUS 2 (TAT 6-24 HRS): SARS Coronavirus 2: NEGATIVE

## 2019-06-30 NOTE — Plan of Care (Signed)
  Problem: Education: Goal: Knowledge of General Education information will improve Description: Including pain rating scale, medication(s)/side effects and non-pharmacologic comfort measures Outcome: Progressing   Problem: Clinical Measurements: Goal: Cardiovascular complication will be avoided Outcome: Progressing   Problem: Activity: Goal: Risk for activity intolerance will decrease Outcome: Progressing   Problem: Coping: Goal: Level of anxiety will decrease Outcome: Progressing   Problem: Elimination: Goal: Will not experience complications related to bowel motility Outcome: Progressing   Problem: Elimination: Goal: Will not experience complications related to urinary retention Outcome: Progressing   Problem: Health Behavior/Discharge Planning: Goal: Ability to manage health-related needs will improve Outcome: Progressing

## 2019-06-30 NOTE — TOC Transition Note (Signed)
Transition of Care [facility]) - CM/SW Discharge Note   Patient Details  Name: Bridget Jackson MRN: [id number] Date of Birth: 05-22-1950  Transition of Care [facility]) CM/SW Contact:  Sharin Mons, RN Phone Number: 06/30/2019, 9:56 AM   Clinical Narrative:     Patient will DC to: [facility] Anticipated DC date: 06/30/2019 Family notified: [name] (sister) Transport by: Corey Harold   Per MD patient ready for DC today . RN, patient, patient's family, and facility notified of DC. Discharge Summary and FL2 sent to facility.... PASRR pending , waived by [facility] admissions. RN to call report prior to discharge [phone]). DC packet on chart. Ambulance transport/ [facility] [phone]) will be requested for patient by nurse once COVID results.  RNCM will sign off for now as intervention is no longer needed. Please consult [place] again if new needs arise.    Final next level of care: Skilled Nursing Facility([facility]) Barriers to Discharge: No Barriers Identified   Patient Goals and CMS Choice Patient states their goals for this hospitalization and ongoing recovery are:: to get better CMS Medicare.gov Compare Post Acute Care list provided to:: Patient Represenative (must comment)(sister Debbie)    Discharge Placement                       Discharge Plan and Services   Discharge Planning Services: CM Consult                                 Social Determinants of Health (SDOH) Interventions     Readmission Risk Interventions Readmission Risk Prevention Plan 04/18/2019  Post Dischage Appt Not Complete  Appt Comments pending medical stability  Medication Screening Complete  Transportation Screening Complete  Some recent data might be hidden

## 2019-06-30 NOTE — Progress Notes (Signed)
[facility] called for transport.  

## 2019-06-30 NOTE — Discharge Summary (Signed)
Physician Discharge Summary  Bridget Jackson [id number] DOB: April 06, 1950 DOA: 06/27/2019  PCP: Kathyrn Lass, MD  Admit date: 06/27/2019 Discharge date: 06/30/2019  Admitted From: ALF Disposition:  SNF  Recommendations for Outpatient Follow-up:  1. Follow up with PCP in 1-2 weeks 2. Follow up with orthopedic surgery in 2-3 weeks  Home Health: none Equipment/Devices: none  Discharge Condition: stable CODE STATUS: Full code Diet recommendation: regular  HPI: Per admitting MD, Bridget Jackson is a 68 y.o. female with medical history significant of schizoaffective disorder who has recently had her psychiatric medications change, history of right knee arthritis, obstructive sleep apnea, diabetes, hyperlipidemia, hyponatremia, hypertension, previous lithium toxicity and obstructive sleep apnea, previous history of CVA who was brought in by her sister from assisted living facility after she had a fall around midnight last night.  Patient apparently laid down and was found about 12 hours later.  She is currently lethargic not able to give adequate history.  Patient is complaining of the right knee pain which is now swollen.  She is also having significant snoring.  Patient apparently has had similar episode in the past when she turned out to be psychotic.  She was evaluated in the ER and a new imaging of her right knee shows severe tricompartmental osteoarthritis with effusion.  She is being admitted to the hospital due to inability to put weight on her legs or get back to her assisted living facility.. ED Course: Temperature is 97.4 blood pressure is now 92/37.  Previously 114/58 pulse 82 respiratory rate of 14 oxygen sat 95% room air.  White count 7.7 hemoglobin 11.1 platelets 107.  Sodium 143 potassium 5.4 chloride 114 CO2 of 18 BUN 30 creatinine 1.45 calcium 9.1.  Urinalysis is negative.  CT of the right knee showed no acute bone abnormality but tricompartmental osteoarthritis of the right knee  most severe in the lateral patellar femoral compartment.  There is a large joint effusion.  Patient be admitted for pain management and possible orthopedic consultation  Hospital Course / Discharge diagnoses: Principal Problem Right knee osteoarthritis/knee effusion-patient sister who is at bedside tells me that she has been evaluated as an outpatient on several occasions considered for knee replacement.  Given significant swelling I have consulted orthopedic surgery, and patient underwent arthrocentesis as well as steroid injection in her right knee. Fluid studies negative for septic arthritis and no crystals were seen suggesting only osteoarthritic type inflammatory changes.  Active Problems Acute metabolic encephalopathy-resolved Schizoaffective disorder-used to be on lithium but then was transitioned to Depakote, continue fluphenazine as well.  Trazodone nightly Generalized debility-PT/OT consult, needs SNF Prior CVA-continue Plavix Iron deficiency anemia-continue iron supplements, hemoglobin overall stable Essential hypertension-continue amiloride, blood pressure overall stable OSA-Per family she has not been able to tolerate CPAP   Discharge Instructions   Allergies as of 06/30/2019      Reactions   Lipitor [atorvastatin Calcium] Other (See Comments)   Muscle pain   Provera [medroxyprogesterone Acetate]    Prednisone    Other reaction(s): Other      Medication List    TAKE these medications   acetaminophen 500 MG tablet Commonly known as: TYLENOL Take 500 mg by mouth every 8 (eight) hours as needed for mild pain.   aMILoride 5 MG tablet Commonly known as: MIDAMOR Take 1 tablet (5 mg total) by mouth daily.   clopidogrel 75 MG tablet Commonly known as: PLAVIX Take 75 mg by mouth daily.   divalproex 250 MG 24 hr tablet Commonly known  as: DEPAKOTE ER Take 6 tablets (1,500 mg total) by mouth at bedtime.   ferrous sulfate 325 (65 FE) MG tablet Take 325 mg by mouth  daily with breakfast.   fluPHENAZine 1 MG tablet Commonly known as: PROLIXIN Take 4 tablets (4 mg total) by mouth at bedtime.   Multivitamin Adults Tabs Take 1 tablet by mouth daily.   traZODone 50 MG tablet Commonly known as: DESYREL Take 1 tablet (50 mg total) by mouth at bedtime.   Vitamin D (Ergocalciferol) 1.25 MG (50000 UNIT) Caps capsule Commonly known as: DRISDOL Take 50,000 Units by mouth every Wednesday.      Contact information for after-discharge care    Destination    [facility] Preferred SNF .   Service: Skilled Nursing Contact information: [address] [place] [phone]              Consultations:  Orthopedic surgery   Procedures/Studies:  Right knee arthrocentesis   DG Elbow Complete Right  Result Date: 06/27/2019 CLINICAL DATA:  Recent fall with elbow pain, initial encounter EXAM: RIGHT ELBOW - COMPLETE 3+ VIEW COMPARISON:  None. FINDINGS: There is no evidence of fracture, dislocation, or joint effusion. There is no evidence of arthropathy or other focal bone abnormality. Soft tissues are unremarkable. IMPRESSION: No acute abnormality noted. Electronically Signed   By: Inez Catalina M.D.   On: 06/27/2019 13:46   CT Head Wo Contrast  Result Date: 06/27/2019 CLINICAL DATA:  [place] last evening. Apparently down for approximately 12 hours. Lethargic. EXAM: CT HEAD WITHOUT CONTRAST TECHNIQUE: Contiguous axial images were obtained from the base of the skull through the vertex without intravenous contrast. COMPARISON:  04/16/2019 FINDINGS: Brain: Stable age advanced cerebral atrophy, ventriculomegaly and periventricular white matter disease. Stable cystic area in the left lateral basal ganglia region possibly a dilated perivascular space or choroid fissure cyst. No extra-axial fluid collections are identified. No CT findings for acute hemispheric infarction or intracranial hemorrhage. No mass lesions. The brainstem  and cerebellum are normal. Vascular: Stable age advanced vascular calcifications but no aneurysm or hyperdense vessels. Skull: No skull fracture or bone lesions. Sinuses/Orbits: The paranasal sinuses and mastoid air cells are clear. The globes are intact. Other: No scalp lesions or hematoma. IMPRESSION: 1. Stable age advanced cerebral atrophy, ventriculomegaly and periventricular white matter disease. 2. No acute intracranial findings or mass lesions. Electronically Signed   By: Marijo Sanes M.D.   On: 06/27/2019 14:02   CT Knee Right Wo Contrast  Result Date: 06/27/2019 CLINICAL DATA:  Status post fall, right knee pain. EXAM: CT OF THE RIGHT KNEE WITHOUT CONTRAST TECHNIQUE: Multidetector CT imaging of the RIGHT knee was performed according to the standard protocol. Multiplanar CT image reconstructions were also generated. COMPARISON:  None. FINDINGS: Bones/Joint/Cartilage No fracture or dislocation. Normal alignment. Moderate medial femorotibial compartment joint space narrowing with subchondral sclerosis and cystic changes. Multiple bony fragments along the anteromedial peripheral aspect of the medial tibial plateau likely reflecting loose bodies versus ununited fragments from prior injury. Mild lateral femorotibial compartment joint space narrowing. Severe lateral patellofemoral compartment joint space narrowing with subchondral cystic changes in the lateral patellar facet and marginal osteophytes. Large joint effusion. No aggressive osseous lesion. Ligaments Ligaments are suboptimally evaluated by CT. Muscles and Tendons No intramuscular fluid collection or hematoma. Mild atrophy of the semimembranosus muscle. Patellar tendon and quadriceps tendon are intact. Soft tissue No fluid collection or hematoma. No soft tissue mass. IMPRESSION: 1. No acute osseous injury of the  right knee. 2. Tricompartmental osteoarthritis of the right knee most severe in the lateral patellofemoral compartment. 3. Large joint  effusion. Electronically Signed   By: Kathreen Devoid   On: 06/27/2019 16:45   DG Knee Complete 4 Views Right  Result Date: 06/27/2019 CLINICAL DATA:  Recent fall with knee pain, initial encounter EXAM: RIGHT KNEE - COMPLETE 4+ VIEW COMPARISON:  01/05/2018 FINDINGS: Tricompartmental degenerative changes are noted. 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 Irregularity of the medial tibial plateau is noted with multiple linear lucencies suspicious for undisplaced tibial plateau fracture. Joint effusion is noted. Soft tissue nodule is noted laterally stable from the prior exam. IMPRESSION: Changes suspicious for acute on chronic medial tibial plateau fracture. CT may be helpful for further evaluation. Electronically Signed   By: Inez Catalina M.D.   On: 06/27/2019 13:47   DG Foot Complete Left  Result Date: 06/27/2019 CLINICAL DATA:  Fall.  Bruising. EXAM: LEFT FOOT - COMPLETE 3+ VIEW COMPARISON:  No prior. FINDINGS: No acute soft tissue bony abnormality identified. No evidence of fracture or dislocation. No radiopaque foreign body. IMPRESSION: No acute abnormality identified. Electronically Signed   By: Marcello Moores  Register   On: 06/27/2019 13:46   VAS [place] LOWER EXTREMITY VENOUS (DVT) (ONLY [facility] & [facility] 7a-7p)  Result Date: 06/27/2019  Lower Venous DVTStudy Indications: Edema.  Performing Technologist: Antonieta Pert RDMS, RVT  Examination Guidelines: A complete evaluation includes B-mode imaging, spectral Doppler, color Doppler, and power Doppler as needed of all accessible portions of each vessel. Bilateral testing is considered an integral part of a complete examination. Limited examinations for reoccurring indications may be performed as noted. The reflux portion of the exam is performed with the patient in reverse Trendelenburg.  +-----+---------------+---------+-----------+----------+--------------+ RIGHTCompressibilityPhasicitySpontaneityPropertiesThrombus Aging  +-----+---------------+---------+-----------+----------+--------------+ CFV  Full           Yes      Yes                                 +-----+---------------+---------+-----------+----------+--------------+   +---------+---------------+---------+-----------+----------+--------------+ LEFT     CompressibilityPhasicitySpontaneityPropertiesThrombus Aging +---------+---------------+---------+-----------+----------+--------------+ CFV      Full           Yes      Yes                                 +---------+---------------+---------+-----------+----------+--------------+ SFJ      Full                                                        +---------+---------------+---------+-----------+----------+--------------+ FV Prox  Full                                                        +---------+---------------+---------+-----------+----------+--------------+ FV Mid   Full                                                        +---------+---------------+---------+-----------+----------+--------------+  FV DistalFull                                                        +---------+---------------+---------+-----------+----------+--------------+ PFV      Full                                                        +---------+---------------+---------+-----------+----------+--------------+ POP      Full           Yes      Yes                                 +---------+---------------+---------+-----------+----------+--------------+ PTV      Full                                                        +---------+---------------+---------+-----------+----------+--------------+ PERO     Full                                                        +---------+---------------+---------+-----------+----------+--------------+ GSV      Full                                                         +---------+---------------+---------+-----------+----------+--------------+     Summary: RIGHT: - No evidence of common femoral vein obstruction.  LEFT: - There is no evidence of deep vein thrombosis in the lower extremity.  - No cystic structure found in the popliteal fossa.  *See table(s) above for measurements and observations. Electronically signed by Deitra Mayo MD on 06/27/2019 at 3:27:37 PM.    Final      Subjective: - no chest pain, shortness of breath, no abdominal pain, nausea or vomiting.   Discharge Exam: BP 116/65 (BP Location: Right Arm)   Pulse 61   Temp (!) 97.5 F (36.4 C) (Oral)   Resp 18   SpO2 100%   General: Pt is alert, awake, not in acute distress Cardiovascular: RRR, S1/S2 +, no rubs, no gallops Respiratory: CTA bilaterally, no wheezing, no rhonchi Abdominal: Soft, NT, ND, bowel sounds + Extremities: no edema, no cyanosis  The results of significant diagnostics from this hospitalization (including imaging, microbiology, ancillary and laboratory) are listed below for reference.     Microbiology: Recent Results (from the past 240 hour(s))  Urine culture     Status: None   Collection Time: 06/27/19  4:01 PM   Specimen: Urine, Random  Result Value Ref Range Status   Specimen Description URINE, RANDOM  Final   Special Requests NONE  Final   Culture   Final  NO GROWTH Performed at [facility], [place] [address] [place], [place] [address]    Report Status 06/28/2019 FINAL  Final  Body fluid culture     Status: None (Preliminary result)   Collection Time: 06/28/19  3:30 PM   Specimen: Body Fluid  Result Value Ref Range Status   Specimen Description FLUID SYNOVIAL RIGHT KNEE  Final   Special Requests NONE  Final   Gram Stain NO WBC SEEN NO ORGANISMS SEEN   Final   Culture   Final    NO GROWTH 2 DAYS Performed at [facility], [address] [place], [place] [address]    Report Status PENDING  Incomplete     Labs: Basic  Metabolic Panel: Recent Labs  Lab 06/27/19 1431 06/28/19 0441 06/30/19 0452  NA 143 143 140  K 5.4* 4.0 4.8  CL 114* 117* 116*  CO2 18* 18* 19*  GLUCOSE 71 80 91  BUN 30* 25* 32*  CREATININE 1.45* 1.42* 1.40*  CALCIUM 9.1 8.7* 8.8*   Liver Function Tests: Recent Labs  Lab 06/28/19 0441  AST 28  ALT 26  ALKPHOS 68  BILITOT 0.6  PROT 5.5*  ALBUMIN 2.9*   CBC: Recent Labs  Lab 06/27/19 1535 06/28/19 0441 06/30/19 0452  WBC 7.7 5.3 4.9  NEUTROABS 5.5  --   --   HGB 11.0* 11.2* 11.8*  HCT 34.7* 35.6* 37.3  MCV 101.5* 102.0* 102.8*  PLT 107* 102* 111*   CBG: No results for input(s): GLUCAP in the last 168 hours. Hgb A1c No results for input(s): HGBA1C in the last 72 hours. Lipid Profile No results for input(s): CHOL, HDL, LDLCALC, TRIG, CHOLHDL, LDLDIRECT in the last 72 hours. Thyroid function studies No results for input(s): TSH, T4TOTAL, T3FREE, THYROIDAB in the last 72 hours.  Invalid input(s): FREET3 Urinalysis    Component Value Date/Time   COLORURINE STRAW (A) 06/27/2019 1610   APPEARANCEUR CLEAR 06/27/2019 1610   LABSPEC 1.008 06/27/2019 1610   PHURINE 7.0 06/27/2019 1610   GLUCOSEU NEGATIVE 06/27/2019 1610   HGBUR NEGATIVE 06/27/2019 1610   BILIRUBINUR NEGATIVE 06/27/2019 1610   KETONESUR NEGATIVE 06/27/2019 1610   PROTEINUR NEGATIVE 06/27/2019 1610   NITRITE NEGATIVE 06/27/2019 1610   LEUKOCYTESUR NEGATIVE 06/27/2019 1610    FURTHER DISCHARGE INSTRUCTIONS:   Get Medicines reviewed and adjusted: Please take all your medications with you for your next visit with your Primary MD   Laboratory/radiological data: Please request your Primary MD to go over all hospital tests and procedure/radiological results at the follow up, please ask your Primary MD to get all Hospital records sent to his/her office.   In some cases, they will be blood work, cultures and biopsy results pending at the time of your discharge. Please request that your primary care  M.D. goes through all the records of your hospital data and follows up on these results.   Also Note the following: If you experience worsening of your admission symptoms, develop shortness of breath, life threatening emergency, suicidal or homicidal thoughts you must seek medical attention immediately by calling 911 or calling your MD immediately  if symptoms less severe.   You must read complete instructions/literature along with all the possible adverse reactions/side effects for all the Medicines you take and that have been prescribed to you. Take any new Medicines after you have completely understood and accpet all the possible adverse reactions/side effects.    Do not drive when taking Pain medications or sleeping medications (  Benzodaizepines)   Do not take more than prescribed Pain, Sleep and Anxiety Medications. It is not advisable to combine anxiety,sleep and pain medications without talking with your primary care practitioner   Special Instructions: If you have smoked or chewed Tobacco  in the last 2 yrs please stop smoking, stop any regular Alcohol  and or any Recreational drug use.   Wear Seat belts while driving.   Please note: You were cared for by a hospitalist during your hospital stay. Once you are discharged, your primary care physician will handle any further medical issues. Please note that NO REFILLS for any discharge medications will be authorized once you are discharged, as it is imperative that you return to your primary care physician (or establish a relationship with a primary care physician if you do not have one) for your post hospital discharge needs so that they can reassess your need for medications and monitor your lab values.  Time coordinating discharge: 40 minutes  SIGNED:  Marzetta Board, MD, PhD 06/30/2019, 2:12 PM

## 2019-06-30 NOTE — Progress Notes (Signed)
Physical Therapy Treatment Patient Details Name: Bridget Jackson MRN: [id number] DOB: 07-07-1950 Today's Date: 06/30/2019    History of Present Illness Pt is a 69 y.o. female ILF resident admitted on 06/27/19 post fall and inability to put weight on legs. CT R knee impression clear for acute osseous injury, tricompartmental OA, large joint effusion. Right knee aspiration and injection 06/28/19. PMH schizoaffective disorder, R knee arthritis, DM, HLD, HTN, previous lithium toxicity, OSA, CVA.   PT Comments    Pt progressing well with mobility. Motivated to participate, at times anxious and tearful with movement but responds well to encouragement and cues for deep breathing. Today's session focused on transfer training with RW from various surfaces, able to take a few steps today as well; up to modA to prevent LOB; at high risk for falls. Sister present and supportive. Continue to recommend SNF-level therapies to maximize functional mobility and independence prior to return to ILF.   Follow Up Recommendations  SNF;Supervision for mobility/OOB     Equipment Recommendations  None recommended by PT    Recommendations for Other Services       Precautions / Restrictions Precautions Precautions: Fall;Other (comment) Precaution Comments: Incontinence Restrictions Weight Bearing Restrictions: No    Mobility  Bed Mobility Overal bed mobility: Needs Assistance Bed Mobility: Supine to Sit     Supine to sit: Min assist;HOB elevated     General bed mobility comments: Cues for sequencing to move towards EOB, reliant on rail support and minA to elevate trunk; pt becoming tearful and anxious during mobility, responding well to encouragement and cues for deep breathing  Transfers Overall transfer level: Needs assistance Equipment used: Rolling walker (2 wheeled) Transfers: Sit to/from Stand Sit to Stand: Mod assist;Min guard         General transfer comment: ModA to assist trunk  elevation standing from EOB; performed multiple stands from [facility] and recliner with min guard, heavy reliance on arm rest support to push into standing  Ambulation/Gait Ambulation/Gait assistance: Min guard;Mod assist Gait Distance (Feet): 6 Feet Assistive device: Rolling walker (2 wheeled) Gait Pattern/deviations: Step-to pattern;Wide base of support;Trunk flexed;Leaning posteriorly Gait velocity: decreased   General Gait Details: Steps from bed to [facility], forwards to recliner, and additional recliner<>BSC with RW and close min guard; 1x modA to prevent posterior LOB. Cues for sequencing, cues for "big steps" which pt responded well too   Stairs             Wheelchair Mobility    Modified Rankin (Stroke Patients Only)       Balance Overall balance assessment: Needs assistance Sitting-balance support: Bilateral upper extremity supported;Feet supported Sitting balance-Leahy Scale: Fair       Standing balance-Leahy Scale: Poor Standing balance comment: Reliant on UE support. Pt able to wipe for anterior pericare with single UE support, dependent for posterior pericare                            Cognition Arousal/Alertness: Awake/alert Behavior During Therapy: WFL for tasks assessed/performed;Flat affect Overall Cognitive Status: History of cognitive impairments - at baseline Area of Impairment: Following commands;Safety/judgement;Awareness;Problem solving                       Following Commands: Follows one step commands with increased time;Follows multi-step commands inconsistently Safety/Judgement: Decreased awareness of deficits;Decreased awareness of safety Awareness: Emergent Problem Solving: Slow processing;Requires verbal cues;Requires tactile cues General Comments: Pt slow to respond, and  requires min cues for problem solving and sequencing       Exercises Other Exercises Other Exercises: 3x repeated sit<>stand from recliner    General  Comments General comments (skin integrity, edema, etc.): Sister present and supportive; discussed plans for SNF-level therapies with pt, sister and CM present      Pertinent Vitals/Pain Pain Assessment: Faces Faces Pain Scale: Hurts a little bit Pain Location: BLEs Pain Descriptors / Indicators: Guarding;Discomfort Pain Intervention(s): Monitored during session    Home Living                      Prior Function            PT Goals (current goals can now be found in the care plan section) Progress towards PT goals: Progressing toward goals    Frequency    Min 2X/week      PT Plan Current plan remains appropriate    Co-evaluation              AM-PAC PT "6 Clicks" Mobility   Outcome Measure  Help needed turning from your back to your side while in a flat bed without using bedrails?: None Help needed moving from lying on your back to sitting on the side of a flat bed without using bedrails?: A Lot Help needed moving to and from a bed to a chair (including a wheelchair)?: A Little Help needed standing up from a chair using your arms (e.g., wheelchair or bedside chair)?: A Little Help needed to walk in hospital room?: A Little Help needed climbing 3-5 steps with a railing? : Total 6 Click Score: 16    End of Session Equipment Utilized During Treatment: Gait belt Activity Tolerance: Patient tolerated treatment well Patient left: in chair;with call bell/phone within reach;with chair alarm set;with family/visitor present Nurse Communication: Mobility status PT Visit Diagnosis: Unsteadiness on feet (R26.81);Pain Pain - part of body: Leg     Time: [id number] PT Time Calculation (min) (ACUTE ONLY): 26 min  Charges:  $Therapeutic Activity: 23-37 mins                    Mabeline Caras, PT, DPT Acute Rehabilitation Services  Pager [phone] Office [place] 06/30/2019, 10:28 AM

## 2019-06-30 NOTE — NC FL2 (Signed)
[place] LEVEL OF CARE SCREENING TOOL     IDENTIFICATION  Patient Name: Bridget Jackson Birthdate: 01-20-1951 Sex: female Admission Date (Current Location): 06/27/2019  [facility] and [place] Number:  Herbalist and Address:  [facility] [place]. [facility], [place] [address] [place], [place] [address]      Provider Number: [id number]  Attending Physician Name and Address:  Caren Griffins, MD  Relative Name and Phone Number:       Current Level of Care: Hospital Recommended Level of Care: [place] Prior Approval Number:    Date Approved/Denied:   PASRR Number: pending  Discharge Plan: SNF    Current Diagnoses: Patient Active Problem List   Diagnosis Date Noted  . AMS (altered mental status) 06/27/2019  . Localized osteoarthritis of right knee 06/27/2019  . 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 CKD (chronic kidney disease), stage III 04/22/2019  . Elevated TSH 04/19/2019  . Hypernatremia 04/18/2019  . UTI (urinary tract infection) 04/17/2019  . Acute metabolic encephalopathy 99991111  . T wave inversion in electrocardiogram 03/17/2018  . Lithium toxicity 03/17/2018  . Nystagmus 03/17/2018  . AKI (acute kidney injury) ([place]) 03/17/2018  . Normal anion gap metabolic acidosis 99991111  . Hyperkalemia 03/17/2018  . Altered mental status   . Hypotension   . 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 CPAP ventilation treatment not tolerated 11/17/2017  . Excessive daytime sleepiness 11/05/2017  . Severe sleep apnea 11/05/2017  . Left shoulder pain 08/31/2017  . Pyuria 08/30/2017  . Debility 08/30/2017  . Schizoaffective disorder ([place]) 08/30/2017  . Hyperlipidemia 08/30/2017  . Essential hypertension 08/30/2017    Orientation RESPIRATION BLADDER Height & Weight     Self, Time, Situation, Place  Normal Continent Weight:   Height:     BEHAVIORAL SYMPTOMS/MOOD NEUROLOGICAL BOWEL NUTRITION STATUS      Continent Diet   AMBULATORY STATUS COMMUNICATION OF NEEDS Skin   Extensive Assist Verbally Normal                       Personal Care Assistance Level of Assistance  Bathing, Feeding, Dressing Bathing Assistance: Maximum assistance Feeding assistance: Independent Dressing Assistance: Maximum assistance     Functional Limitations Info  Sight, Hearing, Speech Sight Info: Adequate Hearing Info: Adequate Speech Info: Adequate    SPECIAL CARE FACTORS FREQUENCY  PT (By licensed PT), OT (By licensed OT)     PT Frequency: 5x/week, evaluate and treat OT Frequency: 5x/week, evaluate and treat            Contractures Contractures Info: Not present    Additional Factors Info  Code Status, Allergies, Psychotropic Code Status Info: DNR Allergies Info: Lipitor Atorvastatin Calcium, Provera Medroxyprogesterone Acetate, Prednisone Psychotropic Info: Prolixin 4mg  qd         Current Medications (06/30/2019):  This is the current hospital active medication list Current Facility-Administered Medications  Medication Dose Route Frequency Provider Last Rate Last Admin  . 0.9 %  sodium chloride infusion   Intravenous Continuous Elwyn Reach, MD   Stopped at 06/28/19 1845  . acetaminophen (TYLENOL) tablet 500 mg  500 mg Oral Q8H PRN Elwyn Reach, MD   500 mg at 06/29/19 1002  . aMILoride (MIDAMOR) tablet 5 mg  5 mg Oral Daily Elwyn Reach, MD   5 mg at 06/30/19 0902  . clopidogrel (PLAVIX) tablet 75 mg  75 mg Oral Daily Elwyn Reach, MD   75 mg  at 06/30/19 0902  . divalproex (DEPAKOTE ER) 24 hr tablet 1,500 mg  1,500 mg Oral QHS Caren Griffins, MD   1,500 mg at 06/29/19 2152  . ferrous sulfate tablet 325 mg  325 mg Oral Q breakfast Elwyn Reach, MD   325 mg at 06/30/19 0859  . fluPHENAZine (PROLIXIN) tablet 4 mg  4 mg Oral QHS Gala Romney L, MD   4 mg at 06/29/19 2153  . heparin injection 5,000 Units  5,000 Units Subcutaneous Q8H Elwyn Reach, MD   5,000 Units at  06/30/19 0518  . morphine 2 MG/ML injection 2 mg  2 mg Intravenous Q2H PRN Elwyn Reach, MD      . multivitamin with minerals tablet 1 tablet  1 tablet Oral Daily Elwyn Reach, MD   1 tablet at 06/30/19 0902  . ondansetron (ZOFRAN) tablet 4 mg  4 mg Oral Q6H PRN Elwyn Reach, MD       Or  . ondansetron (ZOFRAN) injection 4 mg  4 mg Intravenous Q6H PRN Jonelle Sidle, Mohammad L, MD      . polyethylene glycol (MIRALAX / GLYCOLAX) packet 17 g  17 g Oral BID Caren Griffins, MD   17 g at 06/29/19 2152  . senna-docusate (Senokot-S) tablet 1 tablet  1 tablet Oral QHS PRN Caren Griffins, MD      . traZODone (DESYREL) tablet 50 mg  50 mg Oral QHS Elwyn Reach, MD   50 mg at 06/29/19 2206  . Vitamin D (Ergocalciferol) (DRISDOL) capsule 50,000 Units  50,000 Units Oral Q Wed Elwyn Reach, MD   50,000 Units at 06/28/19 [id number]     Discharge Medications: Please see discharge summary for a list of discharge medications.  Relevant Imaging Results:  Relevant Lab Results:   Additional Information SS#: [id number]  Sharin Mons, RN

## 2019-06-30 NOTE — Plan of Care (Signed)

## 2019-06-30 NOTE — Progress Notes (Signed)
Pt report given to [employer] receiving RN.

## 2019-07-02 LAB — BODY FLUID CULTURE
Culture: NO GROWTH
Gram Stain: NONE SEEN

## 2019-07-12 ENCOUNTER — Other Ambulatory Visit: Payer: Self-pay | Admitting: *Deleted

## 2019-07-12 NOTE — Patient Outreach (Signed)
Screened for potential [facility] Care Management needs as a benefit of  [employer].  Member is receiving skilled therapy at [facility].   Writer attended telephonic interdisciplinary team meeting to assess for disposition needs and transition plan for resident.   Facility reports member is from [facility]. Transition plan is to return to ILF. Facility reports member's sister is very supportive.   Will continue to follow for transition plans and potential [facility] needs.    Marthenia Rolling, MSN-Ed, RN,BSN [place] Acute Care Coordinator [phone] [facility]) [phone]  (Toll free office)

## 2019-07-18 ENCOUNTER — Ambulatory Visit: Payer: Medicare Other | Admitting: Psychiatry

## 2019-07-23 DIAGNOSIS — T43591D Poisoning by other antipsychotics and neuroleptics, accidental (unintentional), subsequent encounter: Secondary | ICD-10-CM | POA: Diagnosis not present

## 2019-07-24 ENCOUNTER — Telehealth: Payer: Self-pay | Admitting: Psychiatry

## 2019-07-24 DIAGNOSIS — R0602 Shortness of breath: Secondary | ICD-10-CM | POA: Diagnosis not present

## 2019-07-24 DIAGNOSIS — R531 Weakness: Secondary | ICD-10-CM | POA: Diagnosis not present

## 2019-07-24 DIAGNOSIS — F259 Schizoaffective disorder, unspecified: Secondary | ICD-10-CM | POA: Diagnosis not present

## 2019-07-24 NOTE — Telephone Encounter (Signed)
LM to call back tomorrow after 9 am, if not I will reach out again to [facility]

## 2019-07-24 NOTE — Telephone Encounter (Signed)
Pt sister wants to know if she can get a call back to discuss Tura's medications. Pt doing ok, but still has concerns about meds.

## 2019-07-25 NOTE — Telephone Encounter (Signed)
Left another message to call back

## 2019-07-25 NOTE — Telephone Encounter (Signed)
Returned call to pt's sister/POA. Sister reports that pt fell and did not press her call button because she thought she could get up and remained on the floor for 11 hours. She was then hospitalized and is now in SNF. Sister reports that pt has had weakness since then and seems to be more confused. Sister reports that pt will make comments such as, "my mind is muddled."   Feels Depakote is "helping... but not as good as it was on the Lithium." Sister presented option of moving back to [place] and pt responded with some paranoia and made a comment, "I should have never trusted you." Seems to believe that their other sister is coming to visit to take pt back to [place]. Sister reports that pt's mood has been irritable. Sister reports that pt has been losing weight. Sister reports that pt's roommate asked pt to press her call bell since roommate needed assistance and her call bell was on the floor, and pt refused. Pt's sister reports that this is uncharacteristic of pt.   Sister reports that in the past Prolixin was reduced and pt became very paranoid.   Currently in SNF with plan to discharge next Friday.   Pt's sister asks about possibility of resuming Lithium to improve pt's quality of life despite risks. Discussed that risk of lithium toxicity and diabetes insipidus would be high.  Discussed continuing current plan of care at this time since pt is participating and cooperating with rehab and want to avoid any medication changes that could cause her to become more lethargic or unsteady. Discussed possibly changing medication if mood and behavior worsens. Plan to f/u in office after discharge from SNF.

## 2019-07-26 ENCOUNTER — Other Ambulatory Visit: Payer: Self-pay | Admitting: *Deleted

## 2019-07-26 NOTE — Patient Outreach (Signed)
Screened for potential [facility] Care Management needs as a benefit of  [employer].  Mrs. Sirmon is receiving skilled therapy at [facility].   Writer attended telephonic interdisciplinary team meeting to assess for disposition needs and transition plan for resident.   Facility reports member's transition plan is to return to ILF with support of her sisters. States sisters are working on additional caregiver assistance .  Will plan outreach to sister/DPR [name] to discuss [place] Management follow up.   Marthenia Rolling, MSN-Ed, RN,BSN [place] Acute Care Coordinator [phone] [facility]) [phone]  (Toll free office)

## 2019-08-01 ENCOUNTER — Inpatient Hospital Stay (HOSPITAL_COMMUNITY)
Admission: EM | Admit: 2019-08-01 | Discharge: 2019-08-08 | DRG: 683 | Disposition: A | Discharge status: Skilled Nursing Facility | Payer: Medicare Other | Source: Skilled Nursing Facility | Attending: Internal Medicine | Admitting: Internal Medicine

## 2019-08-01 ENCOUNTER — Encounter (HOSPITAL_COMMUNITY): Payer: Self-pay

## 2019-08-01 ENCOUNTER — Other Ambulatory Visit: Payer: Self-pay

## 2019-08-01 DIAGNOSIS — R531 Weakness: Secondary | ICD-10-CM | POA: Diagnosis not present

## 2019-08-01 DIAGNOSIS — B9620 Unspecified Escherichia coli [E. coli] as the cause of diseases classified elsewhere: Secondary | ICD-10-CM | POA: Diagnosis present

## 2019-08-01 DIAGNOSIS — N179 Acute kidney failure, unspecified: Secondary | ICD-10-CM | POA: Diagnosis not present

## 2019-08-01 DIAGNOSIS — N3000 Acute cystitis without hematuria: Secondary | ICD-10-CM | POA: Diagnosis present

## 2019-08-01 DIAGNOSIS — Z800 Family history of malignant neoplasm of digestive organs: Secondary | ICD-10-CM

## 2019-08-01 DIAGNOSIS — N390 Urinary tract infection, site not specified: Secondary | ICD-10-CM | POA: Diagnosis present

## 2019-08-01 DIAGNOSIS — R4182 Altered mental status, unspecified: Secondary | ICD-10-CM | POA: Diagnosis not present

## 2019-08-01 DIAGNOSIS — E038 Other specified hypothyroidism: Secondary | ICD-10-CM | POA: Diagnosis present

## 2019-08-01 DIAGNOSIS — Z7902 Long term (current) use of antithrombotics/antiplatelets: Secondary | ICD-10-CM

## 2019-08-01 DIAGNOSIS — F259 Schizoaffective disorder, unspecified: Secondary | ICD-10-CM | POA: Diagnosis present

## 2019-08-01 DIAGNOSIS — E785 Hyperlipidemia, unspecified: Secondary | ICD-10-CM | POA: Diagnosis present

## 2019-08-01 DIAGNOSIS — N1832 Chronic kidney disease, stage 3b: Secondary | ICD-10-CM | POA: Diagnosis present

## 2019-08-01 DIAGNOSIS — I959 Hypotension, unspecified: Secondary | ICD-10-CM | POA: Diagnosis not present

## 2019-08-01 DIAGNOSIS — F258 Other schizoaffective disorders: Secondary | ICD-10-CM | POA: Diagnosis not present

## 2019-08-01 DIAGNOSIS — E870 Hyperosmolality and hypernatremia: Secondary | ICD-10-CM | POA: Diagnosis not present

## 2019-08-01 DIAGNOSIS — D6959 Other secondary thrombocytopenia: Secondary | ICD-10-CM | POA: Diagnosis present

## 2019-08-01 DIAGNOSIS — E232 Diabetes insipidus: Secondary | ICD-10-CM | POA: Diagnosis present

## 2019-08-01 DIAGNOSIS — R680 Hypothermia, not associated with low environmental temperature: Secondary | ICD-10-CM | POA: Diagnosis present

## 2019-08-01 DIAGNOSIS — D631 Anemia in chronic kidney disease: Secondary | ICD-10-CM | POA: Diagnosis present

## 2019-08-01 DIAGNOSIS — I129 Hypertensive chronic kidney disease with stage 1 through stage 4 chronic kidney disease, or unspecified chronic kidney disease: Secondary | ICD-10-CM | POA: Diagnosis present

## 2019-08-01 DIAGNOSIS — R9431 Abnormal electrocardiogram [ECG] [EKG]: Secondary | ICD-10-CM | POA: Diagnosis not present

## 2019-08-01 DIAGNOSIS — G4733 Obstructive sleep apnea (adult) (pediatric): Secondary | ICD-10-CM | POA: Diagnosis present

## 2019-08-01 DIAGNOSIS — Z8673 Personal history of transient ischemic attack (TIA), and cerebral infarction without residual deficits: Secondary | ICD-10-CM

## 2019-08-01 DIAGNOSIS — I10 Essential (primary) hypertension: Secondary | ICD-10-CM | POA: Diagnosis not present

## 2019-08-01 DIAGNOSIS — R627 Adult failure to thrive: Secondary | ICD-10-CM | POA: Diagnosis not present

## 2019-08-01 DIAGNOSIS — Z79899 Other long term (current) drug therapy: Secondary | ICD-10-CM

## 2019-08-01 DIAGNOSIS — R5383 Other fatigue: Secondary | ICD-10-CM | POA: Diagnosis not present

## 2019-08-01 DIAGNOSIS — R5381 Other malaise: Secondary | ICD-10-CM | POA: Diagnosis present

## 2019-08-01 DIAGNOSIS — Z20822 Contact with and (suspected) exposure to covid-19: Secondary | ICD-10-CM | POA: Diagnosis present

## 2019-08-01 DIAGNOSIS — Z66 Do not resuscitate: Secondary | ICD-10-CM | POA: Diagnosis present

## 2019-08-01 DIAGNOSIS — D539 Nutritional anemia, unspecified: Secondary | ICD-10-CM | POA: Diagnosis present

## 2019-08-01 LAB — CBC
HCT: 32.8 % — ABNORMAL LOW (ref 36.0–46.0)
Hemoglobin: 10.8 g/dL — ABNORMAL LOW (ref 12.0–15.0)
MCH: 33.1 pg (ref 26.0–34.0)
MCHC: 32.9 g/dL (ref 30.0–36.0)
MCV: 100.6 fL — ABNORMAL HIGH (ref 80.0–100.0)
Platelets: 105 10*3/uL — ABNORMAL LOW (ref 150–400)
RBC: 3.26 MIL/uL — ABNORMAL LOW (ref 3.87–5.11)
RDW: 16.3 % — ABNORMAL HIGH (ref 11.5–15.5)
WBC: 4 10*3/uL (ref 4.0–10.5)
nRBC: 0 % (ref 0.0–0.2)

## 2019-08-01 LAB — BASIC METABOLIC PANEL
Anion gap: 9 (ref 5–15)
BUN: 25 mg/dL — ABNORMAL HIGH (ref 8–23)
CO2: 22 mmol/L (ref 22–32)
Calcium: 8.8 mg/dL — ABNORMAL LOW (ref 8.9–10.3)
Chloride: 109 mmol/L (ref 98–111)
Creatinine, Ser: 1.62 mg/dL — ABNORMAL HIGH (ref 0.44–1.00)
GFR calc Af Amer: 37 mL/min — ABNORMAL LOW (ref >60)
GFR calc non Af Amer: 32 mL/min — ABNORMAL LOW (ref >60)
Glucose, Bld: 88 mg/dL (ref 70–99)
Potassium: 4.6 mmol/L (ref 3.5–5.1)
Sodium: 140 mmol/L (ref 135–145)

## 2019-08-01 MED ORDER — SODIUM CHLORIDE 0.9% FLUSH
3.0000 mL | Freq: Once | INTRAVENOUS | Status: AC
  Administered 2019-08-02: 3 mL via INTRAVENOUS

## 2019-08-01 NOTE — ED Notes (Signed)
Please call Rhys Martini - [phone] when in a room

## 2019-08-01 NOTE — ED Triage Notes (Signed)
Per [facility] pt from [facility] with generalized weakness for a few days, changed her medications approx 3 months ago. They changed her from Lithium to Depakote. Hx of a stroke, no neuro deficits with EMS, problems with bowel/bladder. Pt a/o x4, pt denies any complaints states she's more tired. Family and nursing staff wanted her evaluated.  Per family pt has a DNR on file here at our facility but not at NH   BP 107/59  CBG 106 97.2 98% RA  RR 18  HR 66 1st degree heart block

## 2019-08-02 ENCOUNTER — Emergency Department (HOSPITAL_COMMUNITY): Payer: Medicare Other

## 2019-08-02 ENCOUNTER — Inpatient Hospital Stay (HOSPITAL_COMMUNITY): Payer: Medicare Other

## 2019-08-02 DIAGNOSIS — M6258 Muscle wasting and atrophy, not elsewhere classified, other site: Secondary | ICD-10-CM | POA: Diagnosis not present

## 2019-08-02 DIAGNOSIS — Z20822 Contact with and (suspected) exposure to covid-19: Secondary | ICD-10-CM | POA: Diagnosis present

## 2019-08-02 DIAGNOSIS — R41841 Cognitive communication deficit: Secondary | ICD-10-CM | POA: Diagnosis not present

## 2019-08-02 DIAGNOSIS — D6959 Other secondary thrombocytopenia: Secondary | ICD-10-CM | POA: Diagnosis present

## 2019-08-02 DIAGNOSIS — R319 Hematuria, unspecified: Secondary | ICD-10-CM | POA: Diagnosis not present

## 2019-08-02 DIAGNOSIS — R5381 Other malaise: Secondary | ICD-10-CM

## 2019-08-02 DIAGNOSIS — N3000 Acute cystitis without hematuria: Secondary | ICD-10-CM | POA: Diagnosis present

## 2019-08-02 DIAGNOSIS — R1312 Dysphagia, oropharyngeal phase: Secondary | ICD-10-CM | POA: Diagnosis not present

## 2019-08-02 DIAGNOSIS — R2689 Other abnormalities of gait and mobility: Secondary | ICD-10-CM | POA: Diagnosis not present

## 2019-08-02 DIAGNOSIS — E039 Hypothyroidism, unspecified: Secondary | ICD-10-CM

## 2019-08-02 DIAGNOSIS — R531 Weakness: Secondary | ICD-10-CM | POA: Diagnosis not present

## 2019-08-02 DIAGNOSIS — E232 Diabetes insipidus: Secondary | ICD-10-CM

## 2019-08-02 DIAGNOSIS — D539 Nutritional anemia, unspecified: Secondary | ICD-10-CM | POA: Diagnosis not present

## 2019-08-02 DIAGNOSIS — M2550 Pain in unspecified joint: Secondary | ICD-10-CM | POA: Diagnosis not present

## 2019-08-02 DIAGNOSIS — N179 Acute kidney failure, unspecified: Secondary | ICD-10-CM | POA: Diagnosis present

## 2019-08-02 DIAGNOSIS — R2681 Unsteadiness on feet: Secondary | ICD-10-CM | POA: Diagnosis not present

## 2019-08-02 DIAGNOSIS — D631 Anemia in chronic kidney disease: Secondary | ICD-10-CM | POA: Diagnosis present

## 2019-08-02 DIAGNOSIS — E038 Other specified hypothyroidism: Secondary | ICD-10-CM | POA: Diagnosis present

## 2019-08-02 DIAGNOSIS — Z8673 Personal history of transient ischemic attack (TIA), and cerebral infarction without residual deficits: Secondary | ICD-10-CM | POA: Diagnosis not present

## 2019-08-02 DIAGNOSIS — N1832 Chronic kidney disease, stage 3b: Secondary | ICD-10-CM | POA: Diagnosis present

## 2019-08-02 DIAGNOSIS — I10 Essential (primary) hypertension: Secondary | ICD-10-CM | POA: Diagnosis not present

## 2019-08-02 DIAGNOSIS — G4733 Obstructive sleep apnea (adult) (pediatric): Secondary | ICD-10-CM | POA: Diagnosis present

## 2019-08-02 DIAGNOSIS — Z800 Family history of malignant neoplasm of digestive organs: Secondary | ICD-10-CM | POA: Diagnosis not present

## 2019-08-02 DIAGNOSIS — E785 Hyperlipidemia, unspecified: Secondary | ICD-10-CM | POA: Diagnosis present

## 2019-08-02 DIAGNOSIS — N390 Urinary tract infection, site not specified: Secondary | ICD-10-CM

## 2019-08-02 DIAGNOSIS — I959 Hypotension, unspecified: Secondary | ICD-10-CM

## 2019-08-02 DIAGNOSIS — Z66 Do not resuscitate: Secondary | ICD-10-CM | POA: Diagnosis present

## 2019-08-02 DIAGNOSIS — Z79899 Other long term (current) drug therapy: Secondary | ICD-10-CM | POA: Diagnosis not present

## 2019-08-02 DIAGNOSIS — R680 Hypothermia, not associated with low environmental temperature: Secondary | ICD-10-CM | POA: Diagnosis present

## 2019-08-02 DIAGNOSIS — F259 Schizoaffective disorder, unspecified: Secondary | ICD-10-CM | POA: Diagnosis not present

## 2019-08-02 DIAGNOSIS — I129 Hypertensive chronic kidney disease with stage 1 through stage 4 chronic kidney disease, or unspecified chronic kidney disease: Secondary | ICD-10-CM | POA: Diagnosis present

## 2019-08-02 DIAGNOSIS — Z7401 Bed confinement status: Secondary | ICD-10-CM | POA: Diagnosis not present

## 2019-08-02 DIAGNOSIS — B9620 Unspecified Escherichia coli [E. coli] as the cause of diseases classified elsewhere: Secondary | ICD-10-CM | POA: Diagnosis present

## 2019-08-02 DIAGNOSIS — M6281 Muscle weakness (generalized): Secondary | ICD-10-CM | POA: Diagnosis not present

## 2019-08-02 DIAGNOSIS — Z7902 Long term (current) use of antithrombotics/antiplatelets: Secondary | ICD-10-CM | POA: Diagnosis not present

## 2019-08-02 DIAGNOSIS — R404 Transient alteration of awareness: Secondary | ICD-10-CM | POA: Diagnosis not present

## 2019-08-02 DIAGNOSIS — E870 Hyperosmolality and hypernatremia: Secondary | ICD-10-CM | POA: Diagnosis not present

## 2019-08-02 LAB — URINALYSIS, ROUTINE W REFLEX MICROSCOPIC
Bilirubin Urine: NEGATIVE
Glucose, UA: NEGATIVE mg/dL
Ketones, ur: NEGATIVE mg/dL
Nitrite: POSITIVE — AB
Protein, ur: NEGATIVE mg/dL
Specific Gravity, Urine: 1.006 (ref 1.005–1.030)
WBC, UA: >50 WBC/hpf — ABNORMAL HIGH (ref 0–5)
pH: 6 (ref 5.0–8.0)

## 2019-08-02 LAB — T4, FREE: Free T4: 0.76 ng/dL (ref 0.61–1.12)

## 2019-08-02 LAB — TSH: TSH: 2.552 u[IU]/mL (ref 0.350–4.500)

## 2019-08-02 LAB — SARS CORONAVIRUS 2 BY RT PCR (HOSPITAL ORDER, PERFORMED IN ~~LOC~~ HOSPITAL LAB): SARS Coronavirus 2: NEGATIVE

## 2019-08-02 LAB — BRAIN NATRIURETIC PEPTIDE: B Natriuretic Peptide: 73.8 pg/mL (ref 0.0–100.0)

## 2019-08-02 LAB — LACTIC ACID, PLASMA: Lactic Acid, Venous: 0.5 mmol/L (ref 0.5–1.9)

## 2019-08-02 LAB — CBG MONITORING, ED: Glucose-Capillary: 77 mg/dL (ref 70–99)

## 2019-08-02 LAB — VALPROIC ACID LEVEL: Valproic Acid Lvl: 38 ug/mL — ABNORMAL LOW (ref 50.0–100.0)

## 2019-08-02 LAB — HEMOGLOBIN AND HEMATOCRIT, BLOOD
HCT: 30.5 % — ABNORMAL LOW (ref 36.0–46.0)
Hemoglobin: 9.8 g/dL — ABNORMAL LOW (ref 12.0–15.0)

## 2019-08-02 MED ORDER — TRAZODONE HCL 50 MG PO TABS
50.0000 mg | ORAL_TABLET | Freq: Every day | ORAL | Status: DC
  Administered 2019-08-02 – 2019-08-07 (×6): 50 mg via ORAL
  Filled 2019-08-02 (×6): qty 1

## 2019-08-02 MED ORDER — SODIUM CHLORIDE 0.9 % IV BOLUS
1000.0000 mL | Freq: Once | INTRAVENOUS | Status: AC
  Administered 2019-08-02: 1000 mL via INTRAVENOUS

## 2019-08-02 MED ORDER — ACETAMINOPHEN 325 MG PO TABS: 650.0000 mg | ORAL_TABLET | Freq: Four times a day (QID) | ORAL | Status: DC | PRN

## 2019-08-02 MED ORDER — SODIUM CHLORIDE 0.9 % IV BOLUS
500.0000 mL | Freq: Once | INTRAVENOUS | Status: AC
  Administered 2019-08-02: 500 mL via INTRAVENOUS

## 2019-08-02 MED ORDER — MORPHINE SULFATE (PF) 2 MG/ML IV SOLN: 2.0000 mg | INTRAVENOUS | Status: DC | PRN

## 2019-08-02 MED ORDER — ALBUTEROL SULFATE (2.5 MG/3ML) 0.083% IN NEBU: 2.5000 mg | INHALATION_SOLUTION | Freq: Four times a day (QID) | RESPIRATORY_TRACT | Status: DC | PRN

## 2019-08-02 MED ORDER — SODIUM CHLORIDE 0.9 % IV SOLN
1.0000 g | Freq: Once | INTRAVENOUS | Status: AC
  Administered 2019-08-02: 1 g via INTRAVENOUS
  Filled 2019-08-02: qty 10

## 2019-08-02 MED ORDER — ACETAMINOPHEN 325 MG PO TABS
650.0000 mg | ORAL_TABLET | Freq: Once | ORAL | Status: DC
  Filled 2019-08-02: qty 2

## 2019-08-02 MED ORDER — FLUPHENAZINE HCL 1 MG PO TABS
4.0000 mg | ORAL_TABLET | Freq: Every day | ORAL | Status: DC
  Administered 2019-08-02 – 2019-08-07 (×6): 4 mg via ORAL
  Filled 2019-08-02 (×7): qty 4

## 2019-08-02 MED ORDER — DIVALPROEX SODIUM ER 500 MG PO TB24
1500.0000 mg | ORAL_TABLET | Freq: Every day | ORAL | Status: DC
  Administered 2019-08-02 – 2019-08-07 (×6): 1500 mg via ORAL
  Filled 2019-08-02 (×7): qty 3

## 2019-08-02 MED ORDER — SODIUM CHLORIDE 0.9 % IV SOLN: Freq: Once | INTRAVENOUS | Status: AC

## 2019-08-02 MED ORDER — FERROUS SULFATE 325 (65 FE) MG PO TABS
325.0000 mg | ORAL_TABLET | Freq: Every day | ORAL | Status: DC
  Administered 2019-08-03 – 2019-08-08 (×6): 325 mg via ORAL
  Filled 2019-08-02 (×6): qty 1

## 2019-08-02 MED ORDER — ENOXAPARIN SODIUM 40 MG/0.4ML ~~LOC~~ SOLN
40.0000 mg | SUBCUTANEOUS | Status: DC
  Administered 2019-08-02 – 2019-08-07 (×6): 40 mg via SUBCUTANEOUS
  Filled 2019-08-02 (×7): qty 0.4

## 2019-08-02 MED ORDER — SODIUM CHLORIDE 0.9 % IV SOLN
1.0000 g | INTRAVENOUS | Status: DC
  Administered 2019-08-03 – 2019-08-05 (×3): 1 g via INTRAVENOUS
  Filled 2019-08-02 (×3): qty 10

## 2019-08-02 MED ORDER — ACETAMINOPHEN 650 MG RE SUPP: 650.0000 mg | Freq: Four times a day (QID) | RECTAL | Status: DC | PRN

## 2019-08-02 MED ORDER — CLOPIDOGREL BISULFATE 75 MG PO TABS
75.0000 mg | ORAL_TABLET | Freq: Every day | ORAL | Status: DC
  Administered 2019-08-02 – 2019-08-08 (×7): 75 mg via ORAL
  Filled 2019-08-02 (×7): qty 1

## 2019-08-02 MED ORDER — ONDANSETRON HCL 4 MG/2ML IJ SOLN: 4.0000 mg | Freq: Four times a day (QID) | INTRAMUSCULAR | Status: DC | PRN

## 2019-08-02 MED ORDER — ONDANSETRON HCL 4 MG PO TABS: 4.0000 mg | ORAL_TABLET | Freq: Four times a day (QID) | ORAL | Status: DC | PRN
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
## 2019-08-02 NOTE — Evaluation (Signed)
Physical Therapy Evaluation Patient Details Name: Bridget Jackson MRN: [id number] DOB: May 07, 1950 Today's Date: 08/02/2019   History of Present Illness  Pt is a 69 y/o female admitted from SNF secondary to weakness. Found to have UTI. PMH includes HTN, schizoaffective disorder, OSA, CVA.   Clinical Impression  Pt admitted secondary to problem above with deficits below. Pt requiring mod A for bed mobility and min A to take steps at EOB. Pt requiring frequent rests, secondary to increased fatigue. Pt would prefer to return to ILF, however, feel pt will need more SNF level therapies prior to returning to ILF as she has to be functioning at a mod I level. Will continue to follow acutely to maximize functional mobility independence and safety.     Follow Up Recommendations SNF    Equipment Recommendations  None recommended by PT    Recommendations for Other Services       Precautions / Restrictions Precautions Precautions: Fall Restrictions Weight Bearing Restrictions: No      Mobility  Bed Mobility Overal bed mobility: Needs Assistance Bed Mobility: Supine to Sit;Sit to Supine     Supine to sit: Mod assist Sit to supine: Max assist   General bed mobility comments: Required assist for trunk and LEs throughout bed mobility tasks.   Transfers Overall transfer level: Needs assistance Equipment used: Rolling walker (2 wheeled) Transfers: Sit to/from Stand Sit to Stand: Min assist         General transfer comment: Min A for lift assist and steadying assist from higher stretcher height.   Ambulation/Gait Ambulation/Gait assistance: Min assist   Assistive device: Rolling walker (2 wheeled)       General Gait Details: Took steps forwards and backwards at EOB and required seated rest. Then took side steps and required seated rest. Pt fatiguing very easily throughout. Further mobility deferred.   Stairs            Wheelchair Mobility    Modified Rankin (Stroke  Patients Only)       Balance Overall balance assessment: Needs assistance Sitting-balance support: No upper extremity supported;Feet supported Sitting balance-Leahy Scale: Fair     Standing balance support: Bilateral upper extremity supported;During functional activity Standing balance-Leahy Scale: Poor Standing balance comment: Reliant on BUE support                              Pertinent Vitals/Pain Pain Assessment: No/denies pain    Home Living Family/patient expects to be discharged to:: Skilled nursing facility                      Prior Function Level of Independence: Needs assistance   Gait / Transfers Assistance Needed: Was working with PT on ambulation at SNF  ADL's / Homemaking Assistance Needed: Required assist for ADLs at SNF         Hand Dominance        Extremity/Trunk Assessment   Upper Extremity Assessment Upper Extremity Assessment: Defer to OT evaluation    Lower Extremity Assessment Lower Extremity Assessment: Generalized weakness    Cervical / Trunk Assessment Cervical / Trunk Assessment: Normal  Communication   Communication: No difficulties  Cognition Arousal/Alertness: Awake/alert Behavior During Therapy: WFL for tasks assessed/performed Overall Cognitive Status: History of cognitive impairments - at baseline  General Comments      Exercises General Exercises - Lower Extremity Heel Slides: AROM;Both;10 reps;Supine   Assessment/Plan    PT Assessment Patient needs continued PT services  PT Problem List Decreased strength;Decreased activity tolerance;Decreased balance;Decreased mobility;Decreased knowledge of use of DME       PT Treatment Interventions Gait training;DME instruction;Functional mobility training;Therapeutic activities;Therapeutic exercise;Balance training;Patient/family education    PT Goals (Current goals can be found in the Care Plan  section)  Acute Rehab PT Goals Patient Stated Goal: to get up and walk  PT Goal Formulation: With patient Time For Goal Achievement: 08/16/19 Potential to Achieve Goals: Good    Frequency Min 2X/week   Barriers to discharge        Co-evaluation               AM-PAC PT "6 Clicks" Mobility  Outcome Measure Help needed turning from your back to your side while in a flat bed without using bedrails?: A Little Help needed moving from lying on your back to sitting on the side of a flat bed without using bedrails?: A Lot Help needed moving to and from a bed to a chair (including a wheelchair)?: A Little Help needed standing up from a chair using your arms (e.g., wheelchair or bedside chair)?: A Little Help needed to walk in hospital room?: A Little Help needed climbing 3-5 steps with a railing? : A Lot 6 Click Score: 16    End of Session Equipment Utilized During Treatment: Gait belt Activity Tolerance: Patient limited by fatigue Patient left: in bed;with call bell/phone within reach (on stretcher in ED ) Nurse Communication: Mobility status PT Visit Diagnosis: Unsteadiness on feet (R26.81);Muscle weakness (generalized) (M62.81)    Time: 7673-4193 PT Time Calculation (min) (ACUTE ONLY): 23 min   Charges:   PT Evaluation $PT Eval Moderate Complexity: 1 Mod PT Treatments $Therapeutic Activity: 8-22 mins        Lou Miner, DPT  Acute Rehabilitation Services  Pager: [phone] Office: [phone]   Rudean Hitt 08/02/2019, 6:17 PM

## 2019-08-02 NOTE — Progress Notes (Signed)
CSW received consult for patient due to her coming from [facility].  CSW attempted to reach Boyd at [facility] to determine if the patient was able to return - currently waiting on ar eutrn call.  CSW received message from Dr. Tamala Julian who states the patient does not want to return to [facility] at discharge.  TOC will follow patient for discharge planning.  Madilyn Fireman, MSW, LCSW-A Transitions of Care  Clinical Social Worker  [facility] Emergency Departments  Medical ICU [phone]

## 2019-08-02 NOTE — ED Notes (Signed)
Pt transported to [facility]
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
## 2019-08-02 NOTE — Progress Notes (Signed)
PT Cancellation Note  Patient Details Name: Bridget Jackson MRN: [id number] DOB: 04/21/1950   Cancelled Treatment:    Reason Eval/Treat Not Completed: Other (comment) Pt requesting to eat her soup before PT. Will follow up as schedule allows.   Lou Miner, DPT  Acute Rehabilitation Services  Pager: [phone] Office: [phone]    Rudean Hitt 08/02/2019, 11:46 AM

## 2019-08-03 DIAGNOSIS — N3000 Acute cystitis without hematuria: Secondary | ICD-10-CM

## 2019-08-03 LAB — COMPREHENSIVE METABOLIC PANEL
ALT: 21 U/L (ref 0–44)
AST: 24 U/L (ref 15–41)
Albumin: 2.2 g/dL — ABNORMAL LOW (ref 3.5–5.0)
Alkaline Phosphatase: 53 U/L (ref 38–126)
Anion gap: 4 — ABNORMAL LOW (ref 5–15)
BUN: 16 mg/dL (ref 8–23)
CO2: 23 mmol/L (ref 22–32)
Calcium: 8.1 mg/dL — ABNORMAL LOW (ref 8.9–10.3)
Chloride: 118 mmol/L — ABNORMAL HIGH (ref 98–111)
Creatinine, Ser: 1.16 mg/dL — ABNORMAL HIGH (ref 0.44–1.00)
GFR calc Af Amer: 56 mL/min — ABNORMAL LOW (ref >60)
GFR calc non Af Amer: 48 mL/min — ABNORMAL LOW (ref >60)
Glucose, Bld: 93 mg/dL (ref 70–99)
Potassium: 3.8 mmol/L (ref 3.5–5.1)
Sodium: 145 mmol/L (ref 135–145)
Total Bilirubin: 0.4 mg/dL (ref 0.3–1.2)
Total Protein: 4.3 g/dL — ABNORMAL LOW (ref 6.5–8.1)

## 2019-08-03 LAB — CBC
HCT: 30.2 % — ABNORMAL LOW (ref 36.0–46.0)
Hemoglobin: 10 g/dL — ABNORMAL LOW (ref 12.0–15.0)
MCH: 33.6 pg (ref 26.0–34.0)
MCHC: 33.1 g/dL (ref 30.0–36.0)
MCV: 101.3 fL — ABNORMAL HIGH (ref 80.0–100.0)
Platelets: 91 10*3/uL — ABNORMAL LOW (ref 150–400)
RBC: 2.98 MIL/uL — ABNORMAL LOW (ref 3.87–5.11)
RDW: 16.2 % — ABNORMAL HIGH (ref 11.5–15.5)
WBC: 3.5 10*3/uL — ABNORMAL LOW (ref 4.0–10.5)
nRBC: 0 % (ref 0.0–0.2)

## 2019-08-03 LAB — VITAMIN B12: Vitamin B-12: 1753 pg/mL — ABNORMAL HIGH (ref 180–914)

## 2019-08-03 LAB — FOLATE: Folate: 16.8 ng/mL (ref >5.9)

## 2019-08-03 LAB — LACTIC ACID, PLASMA: Lactic Acid, Venous: 0.9 mmol/L (ref 0.5–1.9)

## 2019-08-03 NOTE — Progress Notes (Signed)
Patient refusing rectal temperature, need to assess due to low tem axillary. Patient stating she does not want that to happen. Patient alert & oriented x4. Patient educated and will reassess. Will continue to monitor patient.

## 2019-08-03 NOTE — Progress Notes (Signed)
PROGRESS NOTE    Bridget Jackson  TKZ:[id number] DOB: 22-Jun-1950 DOA: 08/01/2019 PCP: Kathyrn Lass, MD    Chief Complaint  Patient presents with   Weakness    Brief Narrative:  69 year old lady with prior history of hypertension, hyperlipidemia, CVA, anxiety, schizoaffective disorder, anemia of chronic disease, stage IIIb CKD was brought into ED for generalized weakness, unable to walk, also reports dysuria for about 2 weeks.  On arrival to ED she was found to be hypothermic hypotensive and admitted for evaluation for urinary tract infection.  She was started on IV Rocephin.    Assessment & Plan:   Principal Problem:   UTI (urinary tract infection) Active Problems:   Debility   Schizoaffective disorder (HCC)   Transient hypotension   Diabetes insipidus ([place])   Subclinical hypothyroidism   Macrocytic anemia   DNR (do not resuscitate)  SIRS/urinary tract infection Urine cultures growing more than 100,000 E. coli.  Currently patient is on IV Rocephin, continue to follow the cultures. Follow blood cultures. Ultrasound renal shows chronic disease.    Macrocytic anemia-anemia of chronic disease. Hemoglobin stable around 10. Folate and B12 levels are optimal.   AKI Improving with IV fluids.  Renal ultrasound ruled out hydronephrosis.  Chronic thrombocytopenia ? Medication induced.  Continue to monitor.   OSA Not on CPAP at home.   Schizoaffective disorder:  Resume depakote and prolixin.    Generalized weakness and Debility:  - deconditioning.  - pt/ot evaluation.    DVT prophylaxis: Lovenox Code Status: DNR Family Communication: None at bedside Disposition:   Status is: Inpatient  Remains inpatient appropriate because:IV treatments appropriate due to intensity of illness or inability to take PO   Dispo: The patient is from: ALF              Anticipated d/c is to: SNF              Anticipated d/c date is: 1 day              Patient currently is not  medically stable to d/c.       Consultants:   None  Procedures: US RENAL Right Kidney:  Renal measurements: 2.0 x 5.3 x 5.0 cm = volume: 136 mL. There is diffuse abnormal irregular increased echogenicity of the renal parenchyma with hypoechogenicity of the renal pyramids. Previous CT scan demonstrates peripheral tiny cortical calcifications in both kidneys. 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 No hydronephrosis. No masses.  Left Kidney:  Renal measurements: 10.5 x 5.4 x 5.6 cm = volume: 66 mL. 19 mm cyst on the upper pole. Diffuse abnormal increased inhomogeneous echogenicity of the renal cortex with hypoechogenicity of the renal pyramids. No solid masses. No hydronephrosis.    Antimicrobials: IV Rocephin since admission   Subjective: No new complaints at this time.  No chest pain or shortness of breath, patient still reports some burning during urination.  Objective: Vitals:   08/03/19 0329 08/03/19 0635 08/03/19 0723 08/03/19 1326  BP: 95/66  (!) 81/60 (!) 97/50  Pulse: 61  75 70  Resp: 15  16 13   Temp: (!) 95.1 F (35.1 C) (!) 97.1 F (36.2 C) 99.1 F (37.3 C) 97.7 F (36.5 C)  TempSrc: Rectal Rectal Rectal Oral  SpO2: 98%  95% 95%  Weight: 75.1 kg     Height:        Intake/Output Summary (Last 24 hours) at 08/03/2019 1548 Last data filed at 08/03/2019 1300 Gross per 24 hour  Intake 1060 ml  Output 1225 ml  Net -165 ml   Filed Weights   08/02/19 2019 08/03/19 0329  Weight: 75.6 kg 75.1 kg    Examination:  General exam: Appears calm and comfortable  Respiratory system: Clear to auscultation. Respiratory effort normal. Cardiovascular system: S1 & S2 heard, RRR.  No pedal edema. Gastrointestinal system: Abdomen is nondistended, soft and nontender.Normal bowel sounds heard. Central nervous system: Alert and oriented. No focal neurological deficits. Extremities: Symmetric 5 x 5 power. Skin: No  rashes, lesions or ulcers Psychiatry: . Mood & affect appropriate.     Data Reviewed: I have personally reviewed following labs and imaging studies  CBC: Recent Labs  Lab 08/01/19 2109 08/02/19 1420 08/03/19 0441  WBC 4.0  --  3.5*  HGB 10.8* 9.8* 10.0*  HCT 32.8* 30.5* 30.2*  MCV 100.6*  --  101.3*  PLT 105*  --  91*    Basic Metabolic Panel: Recent Labs  Lab 08/01/19 2109 08/03/19 0407  NA 140 145  K 4.6 3.8  CL 109 118*  CO2 22 23  GLUCOSE 88 93  BUN 25* 16  CREATININE 1.62* 1.16*  CALCIUM 8.8* 8.1*    GFR: Estimated Creatinine Clearance: 45.5 mL/min (A) (by C-G formula based on SCr of 1.16 mg/dL (H)).  Liver Function Tests: Recent Labs  Lab 08/03/19 0407  AST 24  ALT 21  ALKPHOS 53  BILITOT 0.4  PROT 4.3*  ALBUMIN 2.2*    CBG: Recent Labs  Lab 08/02/19 0721  GLUCAP 77     Recent Results (from the past 240 hour(s))  SARS Coronavirus 2 by RT PCR (hospital order, performed in [facility] hospital lab) Nasopharyngeal Nasopharyngeal Swab     Status: None   Collection Time: 08/02/19  6:51 AM   Specimen: Nasopharyngeal Swab  Result Value Ref Range Status   SARS Coronavirus 2 NEGATIVE NEGATIVE Final    Comment: (NOTE) SARS-CoV-2 target nucleic acids are NOT DETECTED.  The SARS-CoV-2 RNA is generally detectable in upper and lower respiratory specimens during the acute phase of infection. The lowest concentration of SARS-CoV-2 viral copies this assay can detect is 250 copies / mL. A negative result does not preclude SARS-CoV-2 infection and should not be used as the sole basis for treatment or other patient management decisions.  A negative result may occur with improper specimen collection / handling, submission of specimen other than nasopharyngeal swab, presence of viral mutation(s) within the areas targeted by this assay, and inadequate number of viral copies (<250 copies / mL). A negative result must be combined with clinical observations,  patient history, and epidemiological information.  Fact Sheet for Patients:   StrictlyIdeas.no  Fact Sheet for Healthcare Providers: BankingDealers.co.za  This test is not yet approved or  cleared by the [place] FDA and has been authorized for detection and/or diagnosis of SARS-CoV-2 by FDA under an Emergency Use Authorization (EUA).  This EUA will remain in effect (meaning this test can be used) for the duration of the COVID-19 declaration under Section 564(b)(1) of the Act, 21 U.S.C. section 360bbb-3(b)(1), unless the authorization is terminated or revoked sooner.  Performed at [facility], [place] [address] [place], [place] [address]   Urine culture     Status: Abnormal (Preliminary result)   Collection Time: 08/02/19  8:31 AM   Specimen: Urine, Random  Result Value Ref Range Status   Specimen Description URINE, RANDOM  Final   Special Requests   Final  NONE Performed at [facility], [place] [address], [place], [place] [address]    Culture >=100,000 COLONIES/mL ESCHERICHIA COLI (A)  Final   Report Status PENDING  Incomplete         Radiology Studies: DG Chest 2 View  Result Date: 08/02/2019 CLINICAL DATA:  Short of breath, hypertension EXAM: CHEST - 2 VIEW COMPARISON:  03/20/2018 FINDINGS: The heart size and mediastinal contours are within normal limits. Both lungs are clear. The visualized skeletal structures are unremarkable. IMPRESSION: No active cardiopulmonary disease. Electronically Signed   By: Randa Ngo M.D.   On: 08/02/2019 02:01   US RENAL  Result Date: 08/02/2019 CLINICAL DATA:  Urinary tract infection. EXAM: RENAL / URINARY TRACT ULTRASOUND COMPLETE COMPARISON:  CT scan dated 01/13/2018 FINDINGS: Right Kidney: Renal measurements: 2.0 x 5.3 x 5.0 cm = volume: 136 mL. There is diffuse abnormal irregular increased echogenicity of the renal parenchyma with hypoechogenicity of the renal pyramids.  Previous CT scan demonstrates peripheral tiny cortical calcifications in both kidneys. 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 No hydronephrosis. No masses. Left Kidney: Renal measurements: 10.5 x 5.4 x 5.6 cm = volume: 66 mL. 19 mm cyst on the upper pole. Diffuse abnormal increased inhomogeneous echogenicity of the renal cortex with hypoechogenicity of the renal pyramids. No solid masses. No hydronephrosis. Bladder: Appears normal for degree of bladder distention. Other: None IMPRESSION: Chronic abnormalities of the renal parenchyma as described above. The possibility of chronic glomerulonephritis or chronic renal cortical necrosis should be considered Electronically Signed   By: Lorriane Shire M.D.   On: 08/02/2019 13:33        Scheduled Meds:  acetaminophen  650 mg Oral Once   clopidogrel  75 mg Oral Daily   divalproex  1,500 mg Oral QHS   enoxaparin (LOVENOX) injection  40 mg Subcutaneous Q24H   ferrous sulfate  325 mg Oral Q breakfast   fluPHENAZine  4 mg Oral QHS   traZODone  50 mg Oral QHS   Continuous Infusions:  cefTRIAXone (ROCEPHIN)  IV 1 g (08/03/19 0622)     LOS: 1 day        Hosie Poisson, MD [facility]   To contact the attending provider between 7A-7P or the covering provider during after hours 7P-7A, please log into the web site [email or web address] and access using universal  password for that web site. If you do not have the password, please call the hospital operator.  08/03/2019, 3:48 PM

## 2019-08-03 NOTE — Progress Notes (Signed)
   08/03/19 0018  Assess: MEWS Score  Temp (!) 96.3 F (35.7 C) (pt states this is her normal temp)  BP (!) 100/56  Pulse Rate 60  ECG Heart Rate 60  Resp 11  SpO2 97 %  O2 Device Room Air  Assess: MEWS Score  MEWS Temp 1  MEWS Systolic 1  MEWS Pulse 0  MEWS RR 1  MEWS LOC 0  MEWS Score 3  MEWS Score Color Yellow  Assess: if the MEWS score is Yellow or Red  Were vital signs taken at a resting state? Yes  Focused Assessment Documented focused assessment  Early Detection of Sepsis Score *See Row Information* Medium  MEWS guidelines implemented *See Row Information* Yes  Treat  MEWS Interventions Other (Comment) (pt refusing temp taken, will reasses. alert & oriented x4)  Take Vital Signs  Increase Vital Sign Frequency  Yellow: Q 2hr X 2 then Q 4hr X 2, if remains yellow, continue Q 4hrs  Escalate  MEWS: Escalate Yellow: discuss with charge nurse/RN and consider discussing with provider and RRT  Notify: Charge Nurse/RN  Name of Charge Nurse/RN Notified Neoma Laming D  Date Charge Nurse/RN Notified 08/03/19  Time Charge Nurse/RN Notified 0030

## 2019-08-03 NOTE — Progress Notes (Signed)
Patient's rectal temp 95.1, paged [facility] for orders. BP 95/66, HR 61,  RR 15, 99% RA. Will continue to monitor

## 2019-08-03 NOTE — Progress Notes (Signed)
Occupational Therapy Evaluation Patient Details Name: Bridget Jackson MRN: [id number] DOB: 28-Nov-1950 Today's Date: 08/03/2019    History of Present Illness Pt is a 69 y/o female admitted from SNF secondary to weakness. Found to have UTI. PMH includes HTN, schizoaffective disorder, OSA, CVA.    Clinical Impression   Patient here from SNF.  She has been requiring assist with ADLs and has been working on walking with a RW.  Today patient is limited by weakness, decreased cognition, balance, and activity tolerance.  She required mod assist with bed mobility and min assist with RW to stand pivot to chair.  Needs min assist with UB ADLs and mod assist.  Patient incontinent of urine during session and required mod assist for LB bathing and dressing.  Requires verbal cues for sequencing and multi step commands.  Patient will need to return to SNF level rehab in order to improve safety and independence with ADLs.  Will continue to follow with OT acutely to address the deficits listed below.      Follow Up Recommendations  SNF;Supervision/Assistance - 24 hour    Equipment Recommendations  Other (comment) (defer to next venue)    Recommendations for Other Services       Precautions / Restrictions Precautions Precautions: Fall Restrictions Weight Bearing Restrictions: No      Mobility Bed Mobility Overal bed mobility: Needs Assistance Bed Mobility: Supine to Sit     Supine to sit: Mod assist     General bed mobility comments: Required assist with pulling torso up and scooting forward  Transfers Overall transfer level: Needs assistance Equipment used: Rolling walker (2 wheeled) Transfers: Sit to/from Stand Sit to Stand: Min assist              Balance Overall balance assessment: Needs assistance Sitting-balance support: No upper extremity supported;Feet supported Sitting balance-Leahy Scale: Fair     Standing balance support: Bilateral upper extremity supported;During  functional activity Standing balance-Leahy Scale: Poor Standing balance comment: Reliant on BUE support                            ADL either performed or assessed with clinical judgement   ADL Overall ADL's : Needs assistance/impaired Eating/Feeding: Set up;Sitting   Grooming: Minimal assistance;Sitting   Upper Body Bathing: Minimal assistance;Sitting;Set up   Lower Body Bathing: Moderate assistance;Sitting/lateral leans;Sit to/from stand   Upper Body Dressing : Minimal assistance;Sitting;Set up   Lower Body Dressing: Moderate assistance;Sitting/lateral leans;Sit to/from stand   Toilet Transfer: Minimal assistance;Stand-pivot;BSC   Toileting- Clothing Manipulation and Hygiene: Moderate assistance;Sit to/from stand;Sitting/lateral lean       Functional mobility during ADLs: Moderate assistance;Rolling walker General ADL Comments: Patient moves quite slowly     Vision         Perception     Praxis      Pertinent Vitals/Pain Pain Assessment: No/denies pain     Hand Dominance Right   Extremity/Trunk Assessment Upper Extremity Assessment Upper Extremity Assessment: Generalized weakness   Lower Extremity Assessment Lower Extremity Assessment: Defer to PT evaluation   Cervical / Trunk Assessment Cervical / Trunk Assessment: Normal   Communication Communication Communication: Expressive difficulties   Cognition Arousal/Alertness: Awake/alert Behavior During Therapy: WFL for tasks assessed/performed Overall Cognitive Status: History of cognitive impairments - at baseline  General Comments: Slow processing, poor safety awareness.  Speaks very directly and gets a little irritated if things do not go as she wants.   General Comments  BP 90/46, has been running low.  SpO2 mid 90s throughout    Exercises     Shoulder Instructions      Home Living Family/patient expects to be discharged to:: Skilled  nursing facility                                 Additional Comments: Resides in apartment at [place]      Prior Functioning/Environment Level of Independence: Needs assistance  Gait / Transfers Assistance Needed: Was working with PT on ambulation at SNF ADL's / Homemaking Assistance Needed: Required assist for ADLs at SNF             OT Problem List: Decreased strength;Decreased activity tolerance;Impaired balance (sitting and/or standing);Decreased cognition;Decreased safety awareness;Decreased knowledge of use of DME or AE      OT Treatment/Interventions: Self-care/ADL training;Therapeutic exercise;Energy conservation;DME and/or AE instruction;Therapeutic activities;Cognitive remediation/compensation;Patient/family education;Balance training    OT Goals(Current goals can be found in the care plan section) Acute Rehab OT Goals Patient Stated Goal: to get out of bed OT Goal Formulation: With patient Time For Goal Achievement: 08/17/19 Potential to Achieve Goals: Good  OT Frequency: Min 2X/week   Barriers to D/C:            Co-evaluation              AM-PAC OT "6 Clicks" Daily Activity     Outcome Measure Help from another person eating meals?: A Little Help from another person taking care of personal grooming?: A Little Help from another person toileting, which includes using toliet, bedpan, or urinal?: A Lot Help from another person bathing (including washing, rinsing, drying)?: A Lot Help from another person to put on and taking off regular upper body clothing?: A Little Help from another person to put on and taking off regular lower body clothing?: A Lot 6 Click Score: 15   End of Session Equipment Utilized During Treatment: Gait belt;Rolling walker Nurse Communication: Mobility status  Activity Tolerance: Patient tolerated treatment well Patient left: in chair;with call bell/phone within reach;with chair alarm set  OT  Visit Diagnosis: Unsteadiness on feet (R26.81);Muscle weakness (generalized) (M62.81);Other symptoms and signs involving cognitive function                Time: 0827-0909 OT Time Calculation (min): 42 min Charges:  OT General Charges $OT Visit: 1 Visit OT Evaluation $OT Eval Moderate Complexity: 1 Mod OT Treatments $Self Care/Home Management : 23-37 mins  August Luz, OTR/L   Phylliss Bob 08/03/2019, 10:10 AM

## 2019-08-03 NOTE — TOC Initial Note (Signed)
Transition of Care [facility]) - Initial/Assessment Note    Patient Details  Name: Bridget Jackson MRN: [id number] Date of Birth: 12/01/1950  [facility] CM/SW Contact:    Emeterio Reeve, [place] Phone Number: 08/03/2019, 1:34 PM  Clinical Narrative:                  CSW met with pt at bedside. CSW introduced self and explained her role at the hospital. Pt reports that PTA she was in at [facility] for skilled nursing for about 2 weeks. Prior to [facility] she was independent. Pt stated she was living in the [place] and would like to return.   CSW reviewed PT/OT reccs of SNF with a pt. Pt states she does not want to return back to [facility] but is open to other snf's in the area. PT gave csw permission to fax out to other facilities in the area.   CSW will continue to follow.   Expected Discharge Plan: Skilled Nursing Facility Barriers to Discharge: Continued Medical Work up   Patient Goals and CMS Choice Patient states their goals for this hospitalization and ongoing recovery are:: TO go back to my apartment CMS Medicare.gov Compare Post Acute Care list provided to:: Patient Choice offered to / list presented to : Patient  Expected Discharge Plan and Services Expected Discharge Plan: [place]       Living arrangements for the past 2 months: [place]                                      Prior Living Arrangements/Services Living arrangements for the past 2 months: [place] Lives with:: Self, Facility Resident Patient language and need for interpreter reviewed:: Yes Do you feel safe going back to the place where you live?: Yes      Need for Family Participation in Patient Care: Yes (Comment) Care giver support system in place?: Yes (comment) Current home services: DME Criminal Activity/Legal Involvement Pertinent to Current Situation/Hospitalization: No - Comment as needed  Activities of Daily Living      Permission  Sought/Granted Permission sought to share information with : Facility Sport and exercise psychologist, Family Supports Permission granted to share information with : Yes, Verbal Permission Granted     Permission granted to share info w AGENCY: SNF, ILF        Emotional Assessment Appearance:: Appears stated age Attitude/Demeanor/Rapport: Engaged Affect (typically observed): Appropriate Orientation: : Oriented to Self, Oriented to Place, Oriented to  Time, Oriented to Situation Alcohol / Substance Use: Not Applicable Psych Involvement: No (comment)  Admission diagnosis:  UTI (urinary tract infection) [N39.0] Acute cystitis without hematuria [N30.00] Hypotension, unspecified hypotension type [I95.9] Patient Active Problem List   Diagnosis Date Noted  . Subclinical hypothyroidism 08/02/2019  . Macrocytic anemia 08/02/2019  . DNR (do not resuscitate) 08/02/2019  . AMS (altered mental status) 06/27/2019  . Localized osteoarthritis of right knee 06/27/2019  . Diabetes insipidus ([place]) 04/22/2019  . CKD (chronic kidney disease), stage III 04/22/2019  . Elevated TSH 04/19/2019  . Hypernatremia 04/18/2019  . UTI (urinary tract infection) 04/17/2019  . Acute metabolic encephalopathy 26/33/3545  . T wave inversion in electrocardiogram 03/17/2018  . Lithium toxicity 03/17/2018  . Nystagmus 03/17/2018  . AKI (acute kidney injury) ([place]) 03/17/2018  . Normal anion gap metabolic acidosis 62/56/3893  . Hyperkalemia 03/17/2018  . Altered mental status   . Transient  hypotension   . 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 CPAP ventilation treatment not tolerated 11/17/2017  . Excessive daytime sleepiness 11/05/2017  . Severe sleep apnea 11/05/2017  . Left shoulder pain 08/31/2017  . Pyuria 08/30/2017  . Debility 08/30/2017  . Schizoaffective disorder ([place]) 08/30/2017  . Hyperlipidemia 08/30/2017  . Essential hypertension 08/30/2017   PCP:  Kathyrn Lass, MD Pharmacy:   [facility]  [place], [place] - [address] AT [address] [place] [address] Phone: [phone] Fax: [phone]     Social Determinants of Health ([place]) Interventions    Readmission Risk Interventions Readmission Risk Prevention Plan 04/18/2019  Post Dischage Appt Not Complete  Appt Comments pending medical stability  Medication Screening Complete  Transportation Screening Complete  Some recent data might be hidden   Emeterio Reeve, Latanya Presser, [place] Social Worker [phone]

## 2019-08-04 LAB — CBC
HCT: 32.1 % — ABNORMAL LOW (ref 36.0–46.0)
Hemoglobin: 10.2 g/dL — ABNORMAL LOW (ref 12.0–15.0)
MCH: 32.9 pg (ref 26.0–34.0)
MCHC: 31.8 g/dL (ref 30.0–36.0)
MCV: 103.5 fL — ABNORMAL HIGH (ref 80.0–100.0)
Platelets: 116 10*3/uL — ABNORMAL LOW (ref 150–400)
RBC: 3.1 MIL/uL — ABNORMAL LOW (ref 3.87–5.11)
RDW: 17.1 % — ABNORMAL HIGH (ref 11.5–15.5)
WBC: 3.9 10*3/uL — ABNORMAL LOW (ref 4.0–10.5)
nRBC: 0 % (ref 0.0–0.2)

## 2019-08-04 LAB — URINE CULTURE: Culture: >=100000 — AB

## 2019-08-04 LAB — BASIC METABOLIC PANEL
Anion gap: 8 (ref 5–15)
BUN: 15 mg/dL (ref 8–23)
CO2: 20 mmol/L — ABNORMAL LOW (ref 22–32)
Calcium: 8.4 mg/dL — ABNORMAL LOW (ref 8.9–10.3)
Chloride: 119 mmol/L — ABNORMAL HIGH (ref 98–111)
Creatinine, Ser: 1.45 mg/dL — ABNORMAL HIGH (ref 0.44–1.00)
GFR calc Af Amer: 42 mL/min — ABNORMAL LOW (ref >60)
GFR calc non Af Amer: 37 mL/min — ABNORMAL LOW (ref >60)
Glucose, Bld: 122 mg/dL — ABNORMAL HIGH (ref 70–99)
Potassium: 4 mmol/L (ref 3.5–5.1)
Sodium: 147 mmol/L — ABNORMAL HIGH (ref 135–145)

## 2019-08-04 MED ORDER — DEXTROSE-NACL 5-0.45 % IV SOLN: INTRAVENOUS | Status: DC

## 2019-08-04 MED ORDER — DEXTROSE-NACL 5-0.9 % IV SOLN: INTRAVENOUS | Status: DC

## 2019-08-04 NOTE — Social Work (Signed)
Bridget Jackson DOB Mar 22, 1950    To Whom It May Concern:  Please be advised that the above-named patient will require a short-term nursing home stay - anticipated 30 days or less for rehabilitation and strengthening.  The plan is for return home.

## 2019-08-04 NOTE — Progress Notes (Addendum)
Physical Therapy Treatment Patient Details Name: Bridget Jackson MRN: [id number] DOB: November 21, 1950 Today's Date: 08/04/2019    History of Present Illness Pt is a 69 y/o female admitted from SNF secondary to weakness. Found to have UTI. PMH includes HTN, schizoaffective disorder, OSA, CVA.     PT Comments    Pt supine in bed,plesant and eager to get up out of bed. Pt is limited in safe mobility by DoE and decreased strength and endurance. Pt requires min A for bed mobility, transfers and ambulation with close chair follow due to need for seated rest breaks. D/c plans remain appropriate at this time. PT will continue to follow acutely.   Follow Up Recommendations  SNF     Equipment Recommendations  None recommended by PT       Precautions / Restrictions Precautions Precautions: Fall Restrictions Weight Bearing Restrictions: No    Mobility  Bed Mobility Overal bed mobility: Needs Assistance Bed Mobility: Supine to Sit     Supine to sit: Min assist     General bed mobility comments: minA for bringing trunk to upright and pad scoot of hips to EoB  Transfers Overall transfer level: Needs assistance Equipment used: Rolling walker (2 wheeled) Transfers: Sit to/from Stand Sit to Stand: Min assist         General transfer comment: 2x attempt without assist, on 3rd attempt provided min A for power up and steadying.   Ambulation/Gait Ambulation/Gait assistance: Min assist Gait Distance (Feet): 18 Feet (1x12, 1x18 with seated rest breaks) Assistive device: Rolling walker (2 wheeled) Gait Pattern/deviations: Step-through pattern;Decreased stride length;Shuffle;Trunk flexed Gait velocity: slowed Gait velocity interpretation: <1.31 ft/sec, indicative of household ambulator General Gait Details: performed marching in place prior to ambulation due to pt "feeling weak" min A for steadying with RW, slow shuffling steps, minor initial knee buckling no LoB, pt with 3/4 DoE requiring  seated rest break for recovery         Balance Overall balance assessment: Needs assistance Sitting-balance support: No upper extremity supported;Feet supported Sitting balance-Leahy Scale: Fair     Standing balance support: Bilateral upper extremity supported;During functional activity Standing balance-Leahy Scale: Poor Standing balance comment: Reliant on BUE support                             Cognition Arousal/Alertness: Awake/alert Behavior During Therapy: WFL for tasks assessed/performed Overall Cognitive Status: History of cognitive impairments - at baseline                                 General Comments: Slow processing, poor safety awareness.  Speaks very directly and gets a little irritated if things do not go as she wants.         General Comments General comments (skin integrity, edema, etc.): Pt sister in room, VSS on RA       Pertinent Vitals/Pain Pain Assessment: No/denies pain           PT Goals (current goals can now be found in the care plan section) Acute Rehab PT Goals Patient Stated Goal: to get out of bed PT Goal Formulation: With patient Time For Goal Achievement: 08/16/19 Potential to Achieve Goals: Good Progress towards PT goals: Progressing toward goals    Frequency    Min 2X/week      PT Plan Current plan remains appropriate       AM-PAC PT "  6 Clicks" Mobility   Outcome Measure  Help needed turning from your back to your side while in a flat bed without using bedrails?: A Little Help needed moving from lying on your back to sitting on the side of a flat bed without using bedrails?: A Little Help needed moving to and from a bed to a chair (including a wheelchair)?: A Little Help needed standing up from a chair using your arms (e.g., wheelchair or bedside chair)?: A Little Help needed to walk in hospital room?: A Little Help needed climbing 3-5 steps with a railing? : A Lot 6 Click Score: 17     End of Session Equipment Utilized During Treatment: Gait belt Activity Tolerance: Patient limited by fatigue Patient left: in bed;with call bell/phone within reach;with family/visitor present;with chair alarm set Nurse Communication: Mobility status PT Visit Diagnosis: Unsteadiness on feet (R26.81);Muscle weakness (generalized) (M62.81)     Time: 1410-1433 PT Time Calculation (min) (ACUTE ONLY): 23 min  Charges:  $Gait Training: 8-22 mins $Therapeutic Exercise: 8-22 mins                     Eniyah Eastmond B. Migdalia Dk PT, DPT Acute Rehabilitation Services Pager [phone] Office [phone]    [place] 08/04/2019, 4:01 PM

## 2019-08-04 NOTE — TOC Progression Note (Addendum)
Transition of Care [facility]) - Progression Note    Patient Details  Name: Bridget Jackson MRN: [id number] Date of Birth: 03-28-1950  Transition of Care [facility]) CM/SW [place], [place] Phone Number: 08/04/2019, 3:22 PM  Clinical Narrative:    Pasrr received [phone] [id number].  CSW spoke with patient's sister Bridget Jackson to discuss discharge planning process. CSW agreed to meet with patient's sister bedside to further discuss discharge plan.  CSW met with patient and patient's sister Bridget Jackson bedside. CSW provided patient and patient's sister bed choices. Patient and sister requested [facility]   CSW contacted [place] with [place] to inquire on possible weekend discharge, awaiting a call back. TOC team will continue to follow for discharge planning.  Expected Discharge Plan: [place] Barriers to Discharge: Continued Medical Work up  Expected Discharge Plan and Services Expected Discharge Plan: [place] arrangements for the past 2 months: [place]                                       Social Determinants of Health (SDOH) Interventions    Readmission Risk Interventions Readmission Risk Prevention Plan 04/18/2019  Post Dischage Appt Not Complete  Appt Comments pending medical stability  Medication Screening Complete  Transportation Screening Complete  Some recent data might be hidden

## 2019-08-04 NOTE — NC FL2 (Signed)
[place] LEVEL OF CARE SCREENING TOOL     IDENTIFICATION  Patient Name: Bridget Jackson Birthdate: 20-Apr-1950 Sex: female Admission Date (Current Location): 08/01/2019  [facility] and [place] Number:  Herbalist and Address:  [facility] [place], [place] [address], [place], [place] [address]      Provider Number: [id number]  Attending Physician Name and Address:  Hosie Poisson, MD  Relative Name and Phone Number:       Current Level of Care: Hospital Recommended Level of Care: [place] Prior Approval Number:    Date Approved/Denied:   PASRR Number: pending  Discharge Plan: SNF    Current Diagnoses: Patient Active Problem List   Diagnosis Date Noted  . Subclinical hypothyroidism 08/02/2019  . Macrocytic anemia 08/02/2019  . DNR (do not resuscitate) 08/02/2019  . AMS (altered mental status) 06/27/2019  . Localized osteoarthritis of right knee 06/27/2019  . 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 CKD (chronic kidney disease), stage III 04/22/2019  . Elevated TSH 04/19/2019  . Hypernatremia 04/18/2019  . UTI (urinary tract infection) 04/17/2019  . Acute metabolic encephalopathy 94/17/4081  . T wave inversion in electrocardiogram 03/17/2018  . Lithium toxicity 03/17/2018  . Nystagmus 03/17/2018  . AKI (acute kidney injury) ([place]) 03/17/2018  . Normal anion gap metabolic acidosis 44/81/8563  . Hyperkalemia 03/17/2018  . Altered mental status   . Transient hypotension   . 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 CPAP ventilation treatment not tolerated 11/17/2017  . Excessive daytime sleepiness 11/05/2017  . Severe sleep apnea 11/05/2017  . Left shoulder pain 08/31/2017  . Pyuria 08/30/2017  . Debility 08/30/2017  . Schizoaffective disorder ([place]) 08/30/2017  . Hyperlipidemia 08/30/2017  . Essential hypertension 08/30/2017    Orientation RESPIRATION BLADDER Height & Weight     Self, Time, Situation, Place   Normal Incontinent Weight: 165 lb 9.1 oz (75.1 kg) Height:  5\' 4"  (162.6 cm)  BEHAVIORAL SYMPTOMS/MOOD NEUROLOGICAL BOWEL NUTRITION STATUS      Incontinent Diet (See discharge summary)  AMBULATORY STATUS COMMUNICATION OF NEEDS Skin   Extensive Assist Verbally Normal                       Personal Care Assistance Level of Assistance  Bathing, Feeding, Dressing Bathing Assistance: Maximum assistance Feeding assistance: Limited assistance Dressing Assistance: Maximum assistance     Functional Limitations Info  Sight, Hearing, Speech Sight Info: Adequate   Speech Info: Adequate    SPECIAL CARE FACTORS FREQUENCY  PT (By licensed PT), OT (By licensed OT)     PT Frequency: 5x a week OT Frequency: 5x a week            Contractures Contractures Info: Not present    Additional Factors Info  Code Status, Allergies Code Status Info: DNR Allergies Info: Lipitor  Provera Prednisone           Current Medications (08/04/2019):  This is the current hospital active medication list Current Facility-Administered Medications  Medication Dose Route Frequency Provider Last Rate Last Admin  . acetaminophen (TYLENOL) tablet 650 mg  650 mg Oral Q6H PRN Fuller Plan A, MD       Or  . acetaminophen (TYLENOL) suppository 650 mg  650 mg Rectal Q6H PRN Fuller Plan A, MD      . acetaminophen (TYLENOL) tablet 650 mg  650 mg Oral Once Tamala Julian, Rondell A, MD      . albuterol (PROVENTIL) (2.5 MG/3ML) 0.083%  nebulizer solution 2.5 mg  2.5 mg Nebulization Q6H PRN Tamala Julian, Rondell A, MD      . cefTRIAXone (ROCEPHIN) 1 g in sodium chloride 0.9 % 100 mL IVPB  1 g Intravenous Q24H Smith, Rondell A, MD 200 mL/hr at 08/04/19 0618 1 g at 08/04/19 0618  . clopidogrel (PLAVIX) tablet 75 mg  75 mg Oral Daily Fuller Plan A, MD   75 mg at 08/03/19 0837  . divalproex (DEPAKOTE ER) 24 hr tablet 1,500 mg  1,500 mg Oral QHS Smith, Rondell A, MD   1,500 mg at 08/03/19 2148  . enoxaparin (LOVENOX) injection 40  mg  40 mg Subcutaneous Q24H Smith, Rondell A, MD   40 mg at 08/03/19 1306  . ferrous sulfate tablet 325 mg  325 mg Oral Q breakfast Fuller Plan A, MD   325 mg at 08/03/19 0837  . fluPHENAZine (PROLIXIN) tablet 4 mg  4 mg Oral QHS Smith, Rondell A, MD   4 mg at 08/03/19 2149  . morphine 2 MG/ML injection 2 mg  2 mg Intravenous Q3H PRN Tamala Julian, Rondell A, MD      . ondansetron (ZOFRAN) tablet 4 mg  4 mg Oral Q6H PRN Fuller Plan A, MD       Or  . ondansetron (ZOFRAN) injection 4 mg  4 mg Intravenous Q6H PRN Smith, Rondell A, MD      . traZODone (DESYREL) tablet 50 mg  50 mg Oral QHS Fuller Plan A, MD   50 mg at 08/03/19 2148     Discharge Medications: Please see discharge summary for a list of discharge medications.  Relevant Imaging Results:  Relevant Lab Results:   Additional Information SS#: [id number]  Emeterio Reeve, [place]

## 2019-08-04 NOTE — Progress Notes (Signed)
PROGRESS NOTE    Bridget Jackson  UUE:[id number] DOB: Jun 20, 1950 DOA: 08/01/2019 PCP: Kathyrn Lass, MD    Chief Complaint  Patient presents with  . Weakness    Brief Narrative:  69 year old lady with prior history of hypertension, hyperlipidemia, CVA, anxiety, schizoaffective disorder, anemia of chronic disease, stage IIIb CKD was brought into ED for generalized weakness, unable to walk, also reports dysuria for about 2 weeks.  On arrival to ED she was found to be hypothermic hypotensive and admitted for evaluation for urinary tract infection.  She was started on IV Rocephin. Her BP have improved. She is not hypothermic anymore. PT evaluation recommended SNF.      Assessment & Plan:   Principal Problem:   UTI (urinary tract infection) Active Problems:   Debility   Schizoaffective disorder (HCC)   Transient hypotension   Diabetes insipidus ([place])   Subclinical hypothyroidism   Macrocytic anemia   DNR (do not resuscitate)  SIRS/urinary tract infection Urine cultures growing more than 100,000 E. coli.  Currently patient is on IV Rocephin, urine cultures show pan sensitivity.  So far negative blood cultures. Ultrasound renal shows chronic disease.    Macrocytic anemia-anemia of chronic disease. Hemoglobin stable around 10. Folate and B12 levels are optimal.   AKI Creatinine is up at 1.48 today . Restart dextrose NS fluids .  Recheck BMp in am.  Renal ultrasound ruled out hydronephrosis.  Chronic thrombocytopenia ? Medication induced.  Continue to monitor.   OSA Not on CPAP at home.   Schizoaffective disorder:  Resume depakote and prolixin.    Generalized weakness and Debility:  - deconditioning.  - pt/ot evaluation recommending SNF.    Hypernatremia: probably from free water deficit.  Dextrose IV fluids started.  Recheck bmp in am.            DVT prophylaxis: Lovenox Code Status: DNR Family Communication: None at bedside Disposition:    Status is: Inpatient  Remains inpatient appropriate because:IV treatments appropriate due to intensity of illness or inability to take PO   Dispo: The patient is from: ALF              Anticipated d/c is to: SNF              Anticipated d/c date is: 1 day              Patient currently is not medically stable to d/c.       Consultants:   None  Procedures: US RENAL Right Kidney:  Renal measurements: 2.0 x 5.3 x 5.0 cm = volume: 136 mL. There is diffuse abnormal irregular increased echogenicity of the renal parenchyma with hypoechogenicity of the renal pyramids. Previous CT scan demonstrates peripheral tiny cortical calcifications in both kidneys. 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 No hydronephrosis. No masses.  Left Kidney:  Renal measurements: 10.5 x 5.4 x 5.6 cm = volume: 66 mL. 19 mm cyst on the upper pole. Diffuse abnormal increased inhomogeneous echogenicity of the renal cortex with hypoechogenicity of the renal pyramids. No solid masses. No hydronephrosis.    Antimicrobials: IV Rocephin since admission   Subjective: No chest pain or shortness of breath at this time.  No nausea vomiting or abdominal pain  Objective: Vitals:   08/03/19 1624 08/03/19 2049 08/04/19 0609 08/04/19 0810  BP: 108/84 (!) 103/47 (!) 114/57 (!) 115/55  Pulse: 72   67  Resp: (!) 9   12  Temp: 97.6 F (36.4 C) 97.7 F (  36.5 C) 98 F (36.7 C) 97.7 F (36.5 C)  TempSrc: Oral Oral Oral Oral  SpO2: 94%   95%  Weight:   75.1 kg   Height:        Intake/Output Summary (Last 24 hours) at 08/04/2019 1447 Last data filed at 08/04/2019 1000 Gross per 24 hour  Intake 600 ml  Output 1700 ml  Net -1100 ml   Filed Weights   08/02/19 2019 08/03/19 0329 08/04/19 0609  Weight: 75.6 kg 75.1 kg 75.1 kg    Examination:  General exam: Alert and comfortable, not in any kind of distress Respiratory system: Clear to auscultation  bilaterally, no wheezing or rhonchi Cardiovascular system: S1-S2 heard, regular rate rhythm, no JVD Gastrointestinal system: Abdomen is soft, nontender, nondistended, bowel sounds normal Central nervous system: Alert and oriented to place and person, grossly nonfocal Extremities: No cyanosis or clubbing Skin: No rashes seen Psychiatry: Mood is appropriate    Data Reviewed: I have personally reviewed following labs and imaging studies  CBC: Recent Labs  Lab 08/01/19 2109 08/02/19 1420 08/03/19 0441 08/04/19 1034  WBC 4.0  --  3.5* 3.9*  HGB 10.8* 9.8* 10.0* 10.2*  HCT 32.8* 30.5* 30.2* 32.1*  MCV 100.6*  --  101.3* 103.5*  PLT 105*  --  91* 116*    Basic Metabolic Panel: Recent Labs  Lab 08/01/19 2109 08/03/19 0407 08/04/19 1034  NA 140 145 147*  K 4.6 3.8 4.0  CL 109 118* 119*  CO2 22 23 20*  GLUCOSE 88 93 122*  BUN 25* 16 15  CREATININE 1.62* 1.16* 1.45*  CALCIUM 8.8* 8.1* 8.4*    GFR: Estimated Creatinine Clearance: 36.4 mL/min (A) (by C-G formula based on SCr of 1.45 mg/dL (H)).  Liver Function Tests: Recent Labs  Lab 08/03/19 0407  AST 24  ALT 21  ALKPHOS 53  BILITOT 0.4  PROT 4.3*  ALBUMIN 2.2*    CBG: Recent Labs  Lab 08/02/19 0721  GLUCAP 77     Recent Results (from the past 240 hour(s))  SARS Coronavirus 2 by RT PCR (hospital order, performed in [facility] hospital lab) Nasopharyngeal Nasopharyngeal Swab     Status: None   Collection Time: 08/02/19  6:51 AM   Specimen: Nasopharyngeal Swab  Result Value Ref Range Status   SARS Coronavirus 2 NEGATIVE NEGATIVE Final    Comment: (NOTE) SARS-CoV-2 target nucleic acids are NOT DETECTED.  The SARS-CoV-2 RNA is generally detectable in upper and lower respiratory specimens during the acute phase of infection. The lowest concentration of SARS-CoV-2 viral copies this assay can detect is 250 copies / mL. A negative result does not preclude SARS-CoV-2 infection and should not be used as the  sole basis for treatment or other patient management decisions.  A negative result may occur with improper specimen collection / handling, submission of specimen other than nasopharyngeal swab, presence of viral mutation(s) within the areas targeted by this assay, and inadequate number of viral copies (<250 copies / mL). A negative result must be combined with clinical observations, patient history, and epidemiological information.  Fact Sheet for Patients:   StrictlyIdeas.no  Fact Sheet for Healthcare Providers: BankingDealers.co.za  This test is not yet approved or  cleared by the [place] FDA and has been authorized for detection and/or diagnosis of SARS-CoV-2 by FDA under an Emergency Use Authorization (EUA).  This EUA will remain in effect (meaning this test can be used) for the duration of the COVID-19 declaration under Section 564(b)(1) of  the Act, 21 U.S.C. section 360bbb-3(b)(1), unless the authorization is terminated or revoked sooner.  Performed at [facility], [place] [address], [place], [place] [address]   Urine culture     Status: Abnormal   Collection Time: 08/02/19  8:31 AM   Specimen: Urine, Random  Result Value Ref Range Status   Specimen Description URINE, RANDOM  Final   Special Requests   Final    NONE Performed at [facility], [place] [address] [place] [address]    Culture >=100,000 COLONIES/mL ESCHERICHIA COLI (A)  Final   Report Status 08/04/2019 FINAL  Final   Organism ID, Bacteria ESCHERICHIA COLI (A)  Final      Susceptibility   Escherichia coli - MIC*    AMPICILLIN 4 SENSITIVE Sensitive     CEFAZOLIN <=4 SENSITIVE Sensitive     CEFTRIAXONE <=0.25 SENSITIVE Sensitive     CIPROFLOXACIN <=0.25 SENSITIVE Sensitive     GENTAMICIN <=1 SENSITIVE Sensitive     IMIPENEM <=0.25 SENSITIVE Sensitive     NITROFURANTOIN <=16 SENSITIVE Sensitive     TRIMETH/SULFA <=20 SENSITIVE Sensitive      AMPICILLIN/SULBACTAM <=2 SENSITIVE Sensitive     PIP/TAZO <=4 SENSITIVE Sensitive     * >=100,000 COLONIES/mL ESCHERICHIA COLI  Culture, blood (routine x 2)     Status: None (Preliminary result)   Collection Time: 08/03/19  4:49 AM   Specimen: BLOOD  Result Value Ref Range Status   Specimen Description BLOOD LEFT ARM  Final   Special Requests   Final    BOTTLES DRAWN AEROBIC AND ANAEROBIC Blood Culture adequate volume   Culture   Final    NO GROWTH 1 DAY Performed at [facility], [address] [place], [place] [address]    Report Status PENDING  Incomplete  Culture, blood (routine x 2)     Status: None (Preliminary result)   Collection Time: 08/03/19  4:57 AM   Specimen: BLOOD  Result Value Ref Range Status   Specimen Description BLOOD RIGHT ARM  Final   Special Requests   Final    BOTTLES DRAWN AEROBIC ONLY Blood Culture adequate volume   Culture   Final    NO GROWTH 1 DAY Performed at [facility], [place] [address], [place], [place] [address]    Report Status PENDING  Incomplete         Radiology Studies: No results found.      Scheduled Meds: . acetaminophen  650 mg Oral Once  . clopidogrel  75 mg Oral Daily  . divalproex  1,500 mg Oral QHS  . enoxaparin (LOVENOX) injection  40 mg Subcutaneous Q24H  . ferrous sulfate  325 mg Oral Q breakfast  . fluPHENAZine  4 mg Oral QHS  . traZODone  50 mg Oral QHS   Continuous Infusions: . cefTRIAXone (ROCEPHIN)  IV 1 g (08/04/19 0618)     LOS: 2 days        Hosie Poisson, MD [facility]   To contact the attending provider between 7A-7P or the covering provider during after hours 7P-7A, please log into the web site [email or web address] and access using universal [place] password for that web site. If you do not have the password, please call the hospital operator.  08/04/2019, 2:47 PM

## 2019-08-05 LAB — BASIC METABOLIC PANEL
Anion gap: 10 (ref 5–15)
BUN: 16 mg/dL (ref 8–23)
CO2: 18 mmol/L — ABNORMAL LOW (ref 22–32)
Calcium: 8.4 mg/dL — ABNORMAL LOW (ref 8.9–10.3)
Chloride: 114 mmol/L — ABNORMAL HIGH (ref 98–111)
Creatinine, Ser: 1.37 mg/dL — ABNORMAL HIGH (ref 0.44–1.00)
GFR calc Af Amer: 45 mL/min — ABNORMAL LOW (ref >60)
GFR calc non Af Amer: 39 mL/min — ABNORMAL LOW (ref >60)
Glucose, Bld: 87 mg/dL (ref 70–99)
Potassium: 4.4 mmol/L (ref 3.5–5.1)
Sodium: 142 mmol/L (ref 135–145)

## 2019-08-05 LAB — CBC
HCT: 30.8 % — ABNORMAL LOW (ref 36.0–46.0)
Hemoglobin: 10.1 g/dL — ABNORMAL LOW (ref 12.0–15.0)
MCH: 33.8 pg (ref 26.0–34.0)
MCHC: 32.8 g/dL (ref 30.0–36.0)
MCV: 103 fL — ABNORMAL HIGH (ref 80.0–100.0)
Platelets: 121 10*3/uL — ABNORMAL LOW (ref 150–400)
RBC: 2.99 MIL/uL — ABNORMAL LOW (ref 3.87–5.11)
RDW: 16.8 % — ABNORMAL HIGH (ref 11.5–15.5)
WBC: 4.3 10*3/uL (ref 4.0–10.5)
nRBC: 0 % (ref 0.0–0.2)

## 2019-08-05 MED ORDER — CEPHALEXIN 500 MG PO CAPS
500.0000 mg | ORAL_CAPSULE | Freq: Two times a day (BID) | ORAL | Status: DC
  Administered 2019-08-05 – 2019-08-08 (×6): 500 mg via ORAL
  Filled 2019-08-05 (×6): qty 1

## 2019-08-05 NOTE — Progress Notes (Signed)
PROGRESS NOTE    Bridget Jackson  HQI:[id number] DOB: 1950-07-17 DOA: 08/01/2019 PCP: Kathyrn Lass, MD    Chief Complaint  Patient presents with  . Weakness    Brief Narrative:  69 year old lady with prior history of hypertension, hyperlipidemia, CVA, anxiety, schizoaffective disorder, anemia of chronic disease, stage IIIb CKD was brought into ED for generalized weakness, unable to walk, also reports dysuria for about 2 weeks.  On arrival to ED she was found to be hypothermic hypotensive and admitted for evaluation for urinary tract infection.  She was started on IV Rocephin. Her BP have improved. She is not hypothermic anymore. PT evaluation recommended SNF.  Transitioned to oral antibiotics today.     Assessment & Plan:   Principal Problem:   UTI (urinary tract infection) Active Problems:   Debility   Schizoaffective disorder (HCC)   Transient hypotension   Diabetes insipidus ([place])   Subclinical hypothyroidism   Macrocytic anemia   DNR (do not resuscitate)  SIRS/urinary tract infection Urine cultures growing more than 100,000 E. coli.  Currently patient is on IV Rocephin, urine cultures show pan sensitivity.  So far negative blood cultures. Ultrasound renal shows chronic disease.  Transition to oral antibiotics today.  Macrocytic anemia-anemia of chronic disease. Hemoglobin stable around 10. Folate and B12 levels are optimal.   AKI Creatinine is up at 1.48 today .  Creatinine improved to 1.37.  Continue with IV fluids.  Chronic thrombocytopenia ? Medication induced.  Improving.  Platelets are 121000 Continue to monitor.   OSA Not on CPAP at home.   Schizoaffective disorder:  Resume depakote and prolixin.    Generalized weakness and Debility:  - deconditioning.  - pt/ot evaluation recommending SNF.    Hypernatremia: probably from free water deficit.  Dextrose IV fluids started.  Repeat BMP shows improvement in the sodium to 142.  Continue with IV  fluids for another 24 hours.  Recheck BMP in am.        DVT prophylaxis: Lovenox Code Status: DNR Family Communication: sister at bedside.  Disposition:   Status is: Inpatient  Remains inpatient appropriate because:IV treatments appropriate due to intensity of illness or inability to take PO   Dispo: The patient is from: ALF              Anticipated d/c is to: SNF              Anticipated d/c date is: 1 day              Patient currently is not medically stable to d/c.       Consultants:   None  Procedures: US RENAL Right Kidney:  Renal measurements: 2.0 x 5.3 x 5.0 cm = volume: 136 mL. There is diffuse abnormal irregular increased echogenicity of the renal parenchyma with hypoechogenicity of the renal pyramids. Previous CT scan demonstrates peripheral tiny cortical calcifications in both kidneys. 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 No hydronephrosis. No masses.  Left Kidney:  Renal measurements: 10.5 x 5.4 x 5.6 cm = volume: 66 mL. 19 mm cyst on the upper pole. Diffuse abnormal increased inhomogeneous echogenicity of the renal cortex with hypoechogenicity of the renal pyramids. No solid masses. No hydronephrosis.    Antimicrobials: IV Rocephin since admission   Subjective: No new complaints at this time.   Objective: Vitals:   08/05/19 0512 08/05/19 0758 08/05/19 1139 08/05/19 1559  BP: 117/74 (!) 98/52 100/68 104/70  Pulse: 64 (!) 58 69 68  Resp:  17 15 18 19   Temp: 98.2 F (36.8 C) 98.4 F (36.9 C) 97.8 F (36.6 C) 97.7 F (36.5 C)  TempSrc: Oral Oral Oral Oral  SpO2: 98% 98% 98% 94%  Weight: 80.1 kg     Height:        Intake/Output Summary (Last 24 hours) at 08/05/2019 1706 Last data filed at 08/05/2019 1628 Gross per 24 hour  Intake 420 ml  Output 2600 ml  Net -2180 ml   Filed Weights   08/03/19 0329 08/04/19 0609 08/05/19 0512  Weight: 75.1 kg 75.1 kg 80.1 kg     Examination:  General exam: Alert and comfortable not in any kind of distress Respiratory system: Clear to auscultation bilaterally, no wheezing or rhonchi Cardiovascular system: S1-S2 heard, regular rate rhythm, no JVD.  No pedal edema Gastrointestinal system: Abdomen is soft, nontender, nondistended, bowel sounds are normal Central nervous system: And oriented, grossly nonfocal Extremities: No pedal edema, cyanosis or clubbing Skin: No rashes seen Psychiatry: Mood is appropriate    Data Reviewed: I have personally reviewed following labs and imaging studies  CBC: Recent Labs  Lab 08/01/19 2109 08/02/19 1420 08/03/19 0441 08/04/19 1034 08/05/19 0519  WBC 4.0  --  3.5* 3.9* 4.3  HGB 10.8* 9.8* 10.0* 10.2* 10.1*  HCT 32.8* 30.5* 30.2* 32.1* 30.8*  MCV 100.6*  --  101.3* 103.5* 103.0*  PLT 105*  --  91* 116* 121*    Basic Metabolic Panel: Recent Labs  Lab 08/01/19 2109 08/03/19 0407 08/04/19 1034 08/05/19 0519  NA 140 145 147* 142  K 4.6 3.8 4.0 4.4  CL 109 118* 119* 114*  CO2 22 23 20* 18*  GLUCOSE 88 93 122* 87  BUN 25* 16 15 16   CREATININE 1.62* 1.16* 1.45* 1.37*  CALCIUM 8.8* 8.1* 8.4* 8.4*    GFR: Estimated Creatinine Clearance: 39.7 mL/min (A) (by C-G formula based on SCr of 1.37 mg/dL (H)).  Liver Function Tests: Recent Labs  Lab 08/03/19 0407  AST 24  ALT 21  ALKPHOS 53  BILITOT 0.4  PROT 4.3*  ALBUMIN 2.2*    CBG: Recent Labs  Lab 08/02/19 0721  GLUCAP 77     Recent Results (from the past 240 hour(s))  SARS Coronavirus 2 by RT PCR (hospital order, performed in [facility] hospital lab) Nasopharyngeal Nasopharyngeal Swab     Status: None   Collection Time: 08/02/19  6:51 AM   Specimen: Nasopharyngeal Swab  Result Value Ref Range Status   SARS Coronavirus 2 NEGATIVE NEGATIVE Final    Comment: (NOTE) SARS-CoV-2 target nucleic acids are NOT DETECTED.  The SARS-CoV-2 RNA is generally detectable in upper and lower respiratory  specimens during the acute phase of infection. The lowest concentration of SARS-CoV-2 viral copies this assay can detect is 250 copies / mL. A negative result does not preclude SARS-CoV-2 infection and should not be used as the sole basis for treatment or other patient management decisions.  A negative result may occur with improper specimen collection / handling, submission of specimen other than nasopharyngeal swab, presence of viral mutation(s) within the areas targeted by this assay, and inadequate number of viral copies (<250 copies / mL). A negative result must be combined with clinical observations, patient history, and epidemiological information.  Fact Sheet for Patients:   StrictlyIdeas.no  Fact Sheet for Healthcare Providers: BankingDealers.co.za  This test is not yet approved or  cleared by the [place] FDA and has been authorized for detection and/or diagnosis of SARS-CoV-2 by  FDA under an Emergency Use Authorization (EUA).  This EUA will remain in effect (meaning this test can be used) for the duration of the COVID-19 declaration under Section 564(b)(1) of the Act, 21 U.S.C. section 360bbb-3(b)(1), unless the authorization is terminated or revoked sooner.  Performed at [facility], [place] [address], [place], [place] [address]   Urine culture     Status: Abnormal   Collection Time: 08/02/19  8:31 AM   Specimen: Urine, Random  Result Value Ref Range Status   Specimen Description URINE, RANDOM  Final   Special Requests   Final    NONE Performed at [facility], [place] [address], [place], [place] [address]    Culture >=100,000 COLONIES/mL ESCHERICHIA COLI (A)  Final   Report Status 08/04/2019 FINAL  Final   Organism ID, Bacteria ESCHERICHIA COLI (A)  Final      Susceptibility   Escherichia coli - MIC*    AMPICILLIN 4 SENSITIVE Sensitive     CEFAZOLIN <=4 SENSITIVE Sensitive     CEFTRIAXONE <=0.25  SENSITIVE Sensitive     CIPROFLOXACIN <=0.25 SENSITIVE Sensitive     GENTAMICIN <=1 SENSITIVE Sensitive     IMIPENEM <=0.25 SENSITIVE Sensitive     NITROFURANTOIN <=16 SENSITIVE Sensitive     TRIMETH/SULFA <=20 SENSITIVE Sensitive     AMPICILLIN/SULBACTAM <=2 SENSITIVE Sensitive     PIP/TAZO <=4 SENSITIVE Sensitive     * >=100,000 COLONIES/mL ESCHERICHIA COLI  Culture, blood (routine x 2)     Status: None (Preliminary result)   Collection Time: 08/03/19  4:49 AM   Specimen: BLOOD  Result Value Ref Range Status   Specimen Description BLOOD LEFT ARM  Final   Special Requests   Final    BOTTLES DRAWN AEROBIC AND ANAEROBIC Blood Culture adequate volume   Culture   Final    NO GROWTH 2 DAYS Performed at [facility], [address] [place], [place] [address]    Report Status PENDING  Incomplete  Culture, blood (routine x 2)     Status: None (Preliminary result)   Collection Time: 08/03/19  4:57 AM   Specimen: BLOOD  Result Value Ref Range Status   Specimen Description BLOOD RIGHT ARM  Final   Special Requests   Final    BOTTLES DRAWN AEROBIC ONLY Blood Culture adequate volume   Culture   Final    NO GROWTH 2 DAYS Performed at [facility], [place] [address], [place], [place] [address]    Report Status PENDING  Incomplete         Radiology Studies: No results found.      Scheduled Meds: . acetaminophen  650 mg Oral Once  . cephALEXin  500 mg Oral Q12H  . clopidogrel  75 mg Oral Daily  . divalproex  1,500 mg Oral QHS  . enoxaparin (LOVENOX) injection  40 mg Subcutaneous Q24H  . ferrous sulfate  325 mg Oral Q breakfast  . fluPHENAZine  4 mg Oral QHS  . traZODone  50 mg Oral QHS   Continuous Infusions: . dextrose 5 % and 0.45% NaCl 50 mL/hr at 08/05/19 1358     LOS: 3 days        Hosie Poisson, MD [facility]   To contact the attending provider between 7A-7P or the covering provider during after hours 7P-7A, please log into the web site  [email or web address] and access using universal [place] password for that web site. If you do not have  the password, please call the hospital operator.  08/05/2019, 5:06 PM

## 2019-08-06 LAB — BASIC METABOLIC PANEL
Anion gap: 7 (ref 5–15)
BUN: 18 mg/dL (ref 8–23)
CO2: 23 mmol/L (ref 22–32)
Calcium: 8.4 mg/dL — ABNORMAL LOW (ref 8.9–10.3)
Chloride: 113 mmol/L — ABNORMAL HIGH (ref 98–111)
Creatinine, Ser: 1.31 mg/dL — ABNORMAL HIGH (ref 0.44–1.00)
GFR calc Af Amer: 48 mL/min — ABNORMAL LOW (ref >60)
GFR calc non Af Amer: 41 mL/min — ABNORMAL LOW (ref >60)
Glucose, Bld: 93 mg/dL (ref 70–99)
Potassium: 4 mmol/L (ref 3.5–5.1)
Sodium: 143 mmol/L (ref 135–145)

## 2019-08-06 LAB — CBC
HCT: 31.3 % — ABNORMAL LOW (ref 36.0–46.0)
Hemoglobin: 10.1 g/dL — ABNORMAL LOW (ref 12.0–15.0)
MCH: 32.9 pg (ref 26.0–34.0)
MCHC: 32.3 g/dL (ref 30.0–36.0)
MCV: 102 fL — ABNORMAL HIGH (ref 80.0–100.0)
Platelets: 117 10*3/uL — ABNORMAL LOW (ref 150–400)
RBC: 3.07 MIL/uL — ABNORMAL LOW (ref 3.87–5.11)
RDW: 16.6 % — ABNORMAL HIGH (ref 11.5–15.5)
WBC: 5 10*3/uL (ref 4.0–10.5)
nRBC: 0 % (ref 0.0–0.2)

## 2019-08-06 LAB — SARS CORONAVIRUS 2 BY RT PCR (HOSPITAL ORDER, PERFORMED IN ~~LOC~~ HOSPITAL LAB): SARS Coronavirus 2: NEGATIVE

## 2019-08-06 MED ORDER — SODIUM CHLORIDE 0.9 % IV BOLUS
500.0000 mL | Freq: Once | INTRAVENOUS | Status: AC
  Administered 2019-08-06: 500 mL via INTRAVENOUS

## 2019-08-06 MED ORDER — MIDODRINE HCL 5 MG PO TABS
5.0000 mg | ORAL_TABLET | Freq: Two times a day (BID) | ORAL | Status: DC
  Administered 2019-08-06 – 2019-08-08 (×4): 5 mg via ORAL
  Filled 2019-08-06 (×4): qty 1

## 2019-08-06 NOTE — Progress Notes (Signed)
PROGRESS NOTE    Bridget Jackson  SAY:[id number] DOB: 10/24/1950 DOA: 08/01/2019 PCP: Kathyrn Lass, MD    Chief Complaint  Patient presents with  . Weakness    Brief Narrative:  69 year old lady with prior history of hypertension, hyperlipidemia, CVA, anxiety, schizoaffective disorder, anemia of chronic disease, stage IIIb CKD was brought into ED for generalized weakness, unable to walk, also reports dysuria for about 2 weeks.  On arrival to ED she was found to be hypothermic hypotensive and admitted for evaluation for urinary tract infection.  She was started on IV Rocephin. Her BP have improved. She is not hypothermic anymore. PT evaluation recommended SNF.  Transitioned to oral antibiotics to complete the course.  Pt seen and examined. RN reports that she was hypotensive, but asymptomatic.  No new complaints.      Assessment & Plan:   Principal Problem:   UTI (urinary tract infection) Active Problems:   Debility   Schizoaffective disorder (HCC)   Transient hypotension   Diabetes insipidus ([place])   Subclinical hypothyroidism   Macrocytic anemia   DNR (do not resuscitate)  SIRS/urinary tract infection Urine cultures growing more than 100,000 E. coli.  Currently patient is on IV Rocephin, urine cultures show pan sensitivity.  So far negative blood cultures. Ultrasound renal shows chronic disease.  Transitioned to keflex to complete the course.   Macrocytic anemia-anemia of chronic disease. Hemoglobin stable around 10. Folate and B12 levels are optimal.   AKI Creatinine improved to 1.3.  Continue to monitor.   Chronic thrombocytopenia ? Medication induced.  Improving.  Platelets are 117000 Continue to monitor.   OSA Not on CPAP at home.   Schizoaffective disorder:  Resume depakote and prolixin.    Generalized weakness and Debility:  - deconditioning.  - pt/ot evaluation recommending SNF.    Hypernatremia: probably from free water deficit.  Dextrose IV  fluids started.  Repeat BMP shows improvement in the sodium to 142.     Hypotension:  Unclear etiology,  Asymptomatic, fluid bolus ordered.       DVT prophylaxis: Lovenox Code Status: DNR Family Communication: none at bedside. .  Disposition:   Status is: Inpatient  Remains inpatient appropriate because:IV treatments appropriate due to intensity of illness or inability to take PO   Dispo: The patient is from: ALF              Anticipated d/c is to: SNF              Anticipated d/c date is: 1 day              Patient currently is not medically stable to d/c.       Consultants:   None  Procedures: US RENAL Right Kidney:  Renal measurements: 2.0 x 5.3 x 5.0 cm = volume: 136 mL. There is diffuse abnormal irregular increased echogenicity of the renal parenchyma with hypoechogenicity of the renal pyramids. Previous CT scan demonstrates peripheral tiny cortical calcifications in both kidneys. 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 No hydronephrosis. No masses.  Left Kidney:  Renal measurements: 10.5 x 5.4 x 5.6 cm = volume: 66 mL. 19 mm cyst on the upper pole. Diffuse abnormal increased inhomogeneous echogenicity of the renal cortex with hypoechogenicity of the renal pyramids. No solid masses. No hydronephrosis.    Antimicrobials: IV Rocephin since admission   Subjective: Pt denies any chest pain or sob.   Objective: Vitals:   08/05/19 2346 08/06/19 0500 08/06/19 0804 08/06/19 1145  BP: 97/61 (!) 96/54 (!) 99/43 (!) 89/48  Pulse: 66 60 65 68  Resp: 17 17 18 17   Temp:  97.6 F (36.4 C) 98.2 F (36.8 C) 98.4 F (36.9 C)  TempSrc:  Oral Axillary Oral  SpO2: 98% 98% 99% 99%  Weight:  75.3 kg    Height:        Intake/Output Summary (Last 24 hours) at 08/06/2019 1458 Last data filed at 08/06/2019 8185 Gross per 24 hour  Intake 240 ml  Output 2200 ml  Net -1960 ml   Filed Weights   08/04/19 0609  08/05/19 0512 08/06/19 0500  Weight: 75.1 kg 80.1 kg 75.3 kg    Examination:  General exam: Alert and comfortable not in any kind of distress Respiratory system: Clear to auscultation bilaterally, no wheezing or rhonchi Cardiovascular system: S1-S2 heard, regular rate rhythm, no JVD, no pedal edema Gastrointestinal system: Abdomen is soft, nontender, nondistended, bowel sounds normal Central nervous system: Alert, oriented to place and person, grossly normal Extremities: No pedal edema or cyanosis Skin: No rashes seen Psychiatry: Mood is appropriate   Data Reviewed: I have personally reviewed following labs and imaging studies  CBC: Recent Labs  Lab 08/01/19 2109 08/01/19 2109 08/02/19 1420 08/03/19 0441 08/04/19 1034 08/05/19 0519 08/06/19 0253  WBC 4.0  --   --  3.5* 3.9* 4.3 5.0  HGB 10.8*   < > 9.8* 10.0* 10.2* 10.1* 10.1*  HCT 32.8*   < > 30.5* 30.2* 32.1* 30.8* 31.3*  MCV 100.6*  --   --  101.3* 103.5* 103.0* 102.0*  PLT 105*  --   --  91* 116* 121* 117*   < > = values in this interval not displayed.    Basic Metabolic Panel: Recent Labs  Lab 08/01/19 2109 08/03/19 0407 08/04/19 1034 08/05/19 0519 08/06/19 0253  NA 140 145 147* 142 143  K 4.6 3.8 4.0 4.4 4.0  CL 109 118* 119* 114* 113*  CO2 22 23 20* 18* 23  GLUCOSE 88 93 122* 87 93  BUN 25* 16 15 16 18   CREATININE 1.62* 1.16* 1.45* 1.37* 1.31*  CALCIUM 8.8* 8.1* 8.4* 8.4* 8.4*    GFR: Estimated Creatinine Clearance: 40.2 mL/min (A) (by C-G formula based on SCr of 1.31 mg/dL (H)).  Liver Function Tests: Recent Labs  Lab 08/03/19 0407  AST 24  ALT 21  ALKPHOS 53  BILITOT 0.4  PROT 4.3*  ALBUMIN 2.2*    CBG: Recent Labs  Lab 08/02/19 0721  GLUCAP 77     Recent Results (from the past 240 hour(s))  SARS Coronavirus 2 by RT PCR (hospital order, performed in [facility] hospital lab) Nasopharyngeal Nasopharyngeal Swab     Status: None   Collection Time: 08/02/19  6:51 AM   Specimen:  Nasopharyngeal Swab  Result Value Ref Range Status   SARS Coronavirus 2 NEGATIVE NEGATIVE Final    Comment: (NOTE) SARS-CoV-2 target nucleic acids are NOT DETECTED.  The SARS-CoV-2 RNA is generally detectable in upper and lower respiratory specimens during the acute phase of infection. The lowest concentration of SARS-CoV-2 viral copies this assay can detect is 250 copies / mL. A negative result does not preclude SARS-CoV-2 infection and should not be used as the sole basis for treatment or other patient management decisions.  A negative result may occur with improper specimen collection / handling, submission of specimen other than nasopharyngeal swab, presence of viral mutation(s) within the areas targeted by this assay, and inadequate number of viral  copies (<250 copies / mL). A negative result must be combined with clinical observations, patient history, and epidemiological information.  Fact Sheet for Patients:   StrictlyIdeas.no  Fact Sheet for Healthcare Providers: BankingDealers.co.za  This test is not yet approved or  cleared by the [place] FDA and has been authorized for detection and/or diagnosis of SARS-CoV-2 by FDA under an Emergency Use Authorization (EUA).  This EUA will remain in effect (meaning this test can be used) for the duration of the COVID-19 declaration under Section 564(b)(1) of the Act, 21 U.S.C. section 360bbb-3(b)(1), unless the authorization is terminated or revoked sooner.  Performed at [facility], [place] [address], [place], [place] [address]   Urine culture     Status: Abnormal   Collection Time: 08/02/19  8:31 AM   Specimen: Urine, Random  Result Value Ref Range Status   Specimen Description URINE, RANDOM  Final   Special Requests   Final    NONE Performed at [facility], [place] [address], [place], [place] [address]    Culture >=100,000 COLONIES/mL ESCHERICHIA COLI (A)  Final    Report Status 08/04/2019 FINAL  Final   Organism ID, Bacteria ESCHERICHIA COLI (A)  Final      Susceptibility   Escherichia coli - MIC*    AMPICILLIN 4 SENSITIVE Sensitive     CEFAZOLIN <=4 SENSITIVE Sensitive     CEFTRIAXONE <=0.25 SENSITIVE Sensitive     CIPROFLOXACIN <=0.25 SENSITIVE Sensitive     GENTAMICIN <=1 SENSITIVE Sensitive     IMIPENEM <=0.25 SENSITIVE Sensitive     NITROFURANTOIN <=16 SENSITIVE Sensitive     TRIMETH/SULFA <=20 SENSITIVE Sensitive     AMPICILLIN/SULBACTAM <=2 SENSITIVE Sensitive     PIP/TAZO <=4 SENSITIVE Sensitive     * >=100,000 COLONIES/mL ESCHERICHIA COLI  Culture, blood (routine x 2)     Status: None (Preliminary result)   Collection Time: 08/03/19  4:49 AM   Specimen: BLOOD  Result Value Ref Range Status   Specimen Description BLOOD LEFT ARM  Final   Special Requests   Final    BOTTLES DRAWN AEROBIC AND ANAEROBIC Blood Culture adequate volume   Culture   Final    NO GROWTH 3 DAYS Performed at [facility], [address] [place], [place] [address]    Report Status PENDING  Incomplete  Culture, blood (routine x 2)     Status: None (Preliminary result)   Collection Time: 08/03/19  4:57 AM   Specimen: BLOOD  Result Value Ref Range Status   Specimen Description BLOOD RIGHT ARM  Final   Special Requests   Final    BOTTLES DRAWN AEROBIC ONLY Blood Culture adequate volume   Culture   Final    NO GROWTH 3 DAYS Performed at [facility], [address] [place], [place] [address]    Report Status PENDING  Incomplete         Radiology Studies: No results found.      Scheduled Meds: . acetaminophen  650 mg Oral Once  . cephALEXin  500 mg Oral Q12H  . clopidogrel  75 mg Oral Daily  . divalproex  1,500 mg Oral QHS  . enoxaparin (LOVENOX) injection  40 mg Subcutaneous Q24H  . ferrous sulfate  325 mg Oral Q breakfast  . fluPHENAZine  4 mg Oral QHS  . traZODone  50 mg Oral QHS   Continuous Infusions:    LOS: 4 days  Hosie Poisson, MD [facility]   To contact the attending provider between 7A-7P or the covering provider during after hours 7P-7A, please log into the web site [email or web address] and access using universal [place] password for that web site. If you do not have the password, please call the hospital operator.  08/06/2019, 2:58 PM

## 2019-08-06 NOTE — TOC Progression Note (Addendum)
Transition of Care [facility]) - Progression Note    Patient Details  Name: Bridget Jackson MRN: [id number] Date of Birth: 02-19-1950  Transition [facility]) CM/SW [place], LCSW Phone Number: [phone] 08/06/2019, 10:15 AM  Clinical Narrative:     Patient is medically stable for discharge therefore CSW reached out to Shelby at [facility] and had to leave a message.  MD was alerted to order a rapid COVID test.  11:40pm- MD alerted CSW that patient was not being discharged today.  TOC team will continue to assist with discharge planning needs.   Expected Discharge Plan: [place] Barriers to Discharge: Continued Medical Work up  Expected Discharge Plan and Services Expected Discharge Plan: [place] arrangements for the past 2 months: [place]                                       Social Determinants of Health (SDOH) Interventions    Readmission Risk Interventions Readmission Risk Prevention Plan 04/18/2019  Post Dischage Appt Not Complete  Appt Comments pending medical stability  Medication Screening Complete  Transportation Screening Complete  Some recent data might be hidden

## 2019-08-07 MED ORDER — MIDODRINE HCL 5 MG PO TABS: 5.0000 mg | ORAL_TABLET | Freq: Two times a day (BID) | ORAL | 1 refills | Status: AC

## 2019-08-07 MED ORDER — CEPHALEXIN 500 MG PO CAPS: 500.0000 mg | ORAL_CAPSULE | Freq: Two times a day (BID) | ORAL | 0 refills | Status: AC

## 2019-08-07 MED ORDER — ONDANSETRON HCL 4 MG PO TABS: 4.0000 mg | ORAL_TABLET | Freq: Four times a day (QID) | ORAL | 0 refills | Status: AC | PRN

## 2019-08-07 NOTE — Progress Notes (Signed)
PROGRESS NOTE    Bridget Jackson  NOM:[id number] DOB: [birth year] DOA: 08/01/2019 PCP: Kathyrn Lass, MD    Chief Complaint  Patient presents with  . Weakness    Brief Narrative:  69 year old lady with prior history of hypertension, hyperlipidemia, CVA, anxiety, schizoaffective disorder, anemia of chronic disease, stage IIIb CKD was brought into ED for generalized weakness, unable to walk, also reports dysuria for about 2 weeks.  On arrival to ED she was found to be hypothermic hypotensive and admitted for evaluation for urinary tract infection.  She was started on IV Rocephin. Her BP have improved. She is not hypothermic anymore. PT evaluation recommended SNF.  Transitioned to oral antibiotics to complete the course. Patient seen and examined, no new complaints. Wants to work with PT. CSW reports [facility] does not have beds available today.    Assessment & Plan:   Principal Problem:   UTI (urinary tract infection) Active Problems:   Debility   Schizoaffective disorder (HCC)   Transient hypotension   Diabetes insipidus ([place])   Subclinical hypothyroidism   Macrocytic anemia   DNR (do not resuscitate)  SIRS/urinary tract infection Urine cultures growing more than 100,000 E. coli.  Currently patient is on IV Rocephin, urine cultures show pan sensitivity.  So far negative blood cultures. Ultrasound renal shows chronic disease.  Transitioned to keflex to complete the course.  No changes in her medications.  Patient denies any dysuria at this time.  Macrocytic anemia-anemia of chronic disease. Hemoglobin stable around 10. Folate and B12 levels are optimal.   AKI Creatinine improved to 1.3.  Repeat BMP in the morning.  Chronic thrombocytopenia ? Medication induced.  Improving.  Platelets are 117000 Continue to monitor.   OSA Not on CPAP at home.   Schizoaffective disorder:  Resume depakote and prolixin.    Generalized weakness and Debility:  - deconditioning.  - pt/ot  evaluation recommending SNF.  Currently waiting for a bed at SNF.   Hypernatremia: probably from free water deficit.  Dextrose IV fluids started.  Repeat BMP shows improvement in sodium.    Hypotension:  Unclear etiology,  Asymptomatic, fluid bolus ordered.  And blood pressure parameters have improved. A.m. cortisol level ordered to check for adrenal insufficiency. TSH within normal limits.  Lactic acid is within normal limits      DVT prophylaxis: Lovenox Code Status: DNR Family Communication: none at bedside. .  Disposition:   Status is: Inpatient  Remains inpatient appropriate because:Unsafe d/c plan and IV treatments appropriate due to intensity of illness or inability to take PO   Dispo: The patient is from: ALF              Anticipated d/c is to: SNF              Anticipated d/c date is: 1 day              Patient currently is medically stable to d/c.       Consultants:   None  Procedures: US RENAL Right Kidney:  Renal measurements: 2.0 x 5.3 x 5.0 cm = volume: 136 mL. There is diffuse abnormal irregular increased echogenicity of the renal parenchyma with hypoechogenicity of the renal pyramids. Previous CT scan demonstrates peripheral tiny cortical calcifications in both kidneys. 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 No hydronephrosis. No masses.  Left Kidney:  Renal measurements: 10.5 x 5.4 x 5.6 cm = volume: 66 mL. 19 mm cyst on the upper pole. Diffuse abnormal increased inhomogeneous  echogenicity of the renal cortex with hypoechogenicity of the renal pyramids. No solid masses. No hydronephrosis.    Antimicrobials: IV Rocephin since admission   Subjective: Alert and comfortable not in any kind of distress.  Objective: Vitals:   08/07/19 0516 08/07/19 0813 08/07/19 1240 08/07/19 1508  BP: (!) 90/50 (!) 90/50 (!) 90/50 93/64  Pulse: (!) 55 60 65 62  Resp: 18 16 20 18   Temp: (!) 97.4 F  (36.3 C) 97.6 F (36.4 C) 97.6 F (36.4 C) (!) 97 F (36.1 C)  TempSrc: Axillary Oral Oral Oral  SpO2: 100% 100% 97% 98%  Weight: 74.8 kg     Height:        Intake/Output Summary (Last 24 hours) at 08/07/2019 1722 Last data filed at 08/07/2019 1000 Gross per 24 hour  Intake 600 ml  Output 1700 ml  Net -1100 ml   Filed Weights   08/05/19 0512 08/06/19 0500 08/07/19 0516  Weight: 80.1 kg 75.3 kg 74.8 kg    Examination:  General exam: Alert, comfortable, no distress noted Respiratory system: Clear to auscultation bilaterally, no wheezing or rhonchi Cardiovascular system: S1-S2 heard, regular rate rhythm, no JVD, no pedal edema Gastrointestinal system: Abdomen is soft, nontender, nondistended, bowel sounds are normal Central nervous system: Alert and oriented to place and person, grossly nonfocal Extremities: No cyanosis or clubbing Skin: No rashes seen Psychiatry: Mood is appropriate   Data Reviewed: I have personally reviewed following labs and imaging studies  CBC: Recent Labs  Lab 08/01/19 2109 08/01/19 2109 08/02/19 1420 08/03/19 0441 08/04/19 1034 08/05/19 0519 08/06/19 0253  WBC 4.0  --   --  3.5* 3.9* 4.3 5.0  HGB 10.8*   < > 9.8* 10.0* 10.2* 10.1* 10.1*  HCT 32.8*   < > 30.5* 30.2* 32.1* 30.8* 31.3*  MCV 100.6*  --   --  101.3* 103.5* 103.0* 102.0*  PLT 105*  --   --  91* 116* 121* 117*   < > = values in this interval not displayed.    Basic Metabolic Panel: Recent Labs  Lab 08/01/19 2109 08/03/19 0407 08/04/19 1034 08/05/19 0519 08/06/19 0253  NA 140 145 147* 142 143  K 4.6 3.8 4.0 4.4 4.0  CL 109 118* 119* 114* 113*  CO2 22 23 20* 18* 23  GLUCOSE 88 93 122* 87 93  BUN 25* 16 15 16 18   CREATININE 1.62* 1.16* 1.45* 1.37* 1.31*  CALCIUM 8.8* 8.1* 8.4* 8.4* 8.4*    GFR: Estimated Creatinine Clearance: 40.1 mL/min (A) (by C-G formula based on SCr of 1.31 mg/dL (H)).  Liver Function Tests: Recent Labs  Lab 08/03/19 0407  AST 24  ALT 21    ALKPHOS 53  BILITOT 0.4  PROT 4.3*  ALBUMIN 2.2*    CBG: Recent Labs  Lab 08/02/19 0721  GLUCAP 77     Recent Results (from the past 240 hour(s))  SARS Coronavirus 2 by RT PCR (hospital order, performed in [facility] hospital lab) Nasopharyngeal Nasopharyngeal Swab     Status: None   Collection Time: 08/02/19  6:51 AM   Specimen: Nasopharyngeal Swab  Result Value Ref Range Status   SARS Coronavirus 2 NEGATIVE NEGATIVE Final    Comment: (NOTE) SARS-CoV-2 target nucleic acids are NOT DETECTED.  The SARS-CoV-2 RNA is generally detectable in upper and lower respiratory specimens during the acute phase of infection. The lowest concentration of SARS-CoV-2 viral copies this assay can detect is 250 copies / mL. A negative result does not  preclude SARS-CoV-2 infection and should not be used as the sole basis for treatment or other patient management decisions.  A negative result may occur with improper specimen collection / handling, submission of specimen other than nasopharyngeal swab, presence of viral mutation(s) within the areas targeted by this assay, and inadequate number of viral copies (<250 copies / mL). A negative result must be combined with clinical observations, patient history, and epidemiological information.  Fact Sheet for Patients:   StrictlyIdeas.no  Fact Sheet for Healthcare Providers: BankingDealers.co.za  This test is not yet approved or  cleared by the [place] FDA and has been authorized for detection and/or diagnosis of SARS-CoV-2 by FDA under an Emergency Use Authorization (EUA).  This EUA will remain in effect (meaning this test can be used) for the duration of the COVID-19 declaration under Section 564(b)(1) of the Act, 21 U.S.C. section 360bbb-3(b)(1), unless the authorization is terminated or revoked sooner.  Performed at [facility], [place] [address], [place], [place] [address]    Urine culture     Status: Abnormal   Collection Time: 08/02/19  8:31 AM   Specimen: Urine, Random  Result Value Ref Range Status   Specimen Description URINE, RANDOM  Final   Special Requests   Final    NONE Performed at [facility], [place] [address], [place], [place] [address]    Culture >=100,000 COLONIES/mL ESCHERICHIA COLI (A)  Final   Report Status 08/04/2019 FINAL  Final   Organism ID, Bacteria ESCHERICHIA COLI (A)  Final      Susceptibility   Escherichia coli - MIC*    AMPICILLIN 4 SENSITIVE Sensitive     CEFAZOLIN <=4 SENSITIVE Sensitive     CEFTRIAXONE <=0.25 SENSITIVE Sensitive     CIPROFLOXACIN <=0.25 SENSITIVE Sensitive     GENTAMICIN <=1 SENSITIVE Sensitive     IMIPENEM <=0.25 SENSITIVE Sensitive     NITROFURANTOIN <=16 SENSITIVE Sensitive     TRIMETH/SULFA <=20 SENSITIVE Sensitive     AMPICILLIN/SULBACTAM <=2 SENSITIVE Sensitive     PIP/TAZO <=4 SENSITIVE Sensitive     * >=100,000 COLONIES/mL ESCHERICHIA COLI  Culture, blood (routine x 2)     Status: None (Preliminary result)   Collection Time: 08/03/19  4:49 AM   Specimen: BLOOD  Result Value Ref Range Status   Specimen Description BLOOD LEFT ARM  Final   Special Requests   Final    BOTTLES DRAWN AEROBIC AND ANAEROBIC Blood Culture adequate volume   Culture   Final    NO GROWTH 4 DAYS Performed at [facility], [address] [place], [place] [address]    Report Status PENDING  Incomplete  Culture, blood (routine x 2)     Status: None (Preliminary result)   Collection Time: 08/03/19  4:57 AM   Specimen: BLOOD  Result Value Ref Range Status   Specimen Description BLOOD RIGHT ARM  Final   Special Requests   Final    BOTTLES DRAWN AEROBIC ONLY Blood Culture adequate volume   Culture   Final    NO GROWTH 4 DAYS Performed at [facility], [place] [address] [place],  [address]    Report Status PENDING  Incomplete  SARS Coronavirus 2 by RT PCR (hospital order, performed in [facility]  hospital lab) Nasopharyngeal Nasopharyngeal Swab     Status: None   Collection Time: 08/06/19  3:02 PM   Specimen: Nasopharyngeal Swab  Result Value Ref Range Status   SARS Coronavirus 2  NEGATIVE NEGATIVE Final    Comment: (NOTE) SARS-CoV-2 target nucleic acids are NOT DETECTED.  The SARS-CoV-2 RNA is generally detectable in upper and lower respiratory specimens during the acute phase of infection. The lowest concentration of SARS-CoV-2 viral copies this assay can detect is 250 copies / mL. A negative result does not preclude SARS-CoV-2 infection and should not be used as the sole basis for treatment or other patient management decisions.  A negative result may occur with improper specimen collection / handling, submission of specimen other than nasopharyngeal swab, presence of viral mutation(s) within the areas targeted by this assay, and inadequate number of viral copies (<250 copies / mL). A negative result must be combined with clinical observations, patient history, and epidemiological information.  Fact Sheet for Patients:   StrictlyIdeas.no  Fact Sheet for Healthcare Providers: BankingDealers.co.za  This test is not yet approved or  cleared by the [place] FDA and has been authorized for detection and/or diagnosis of SARS-CoV-2 by FDA under an Emergency Use Authorization (EUA).  This EUA will remain in effect (meaning this test can be used) for the duration of the COVID-19 declaration under Section 564(b)(1) of the Act, 21 U.S.C. section 360bbb-3(b)(1), unless the authorization is terminated or revoked sooner.  Performed at [facility], [place] [address] [place], [place] [address]          Radiology Studies: No results found.      Scheduled Meds: . acetaminophen  650 mg Oral Once  . cephALEXin  500 mg Oral Q12H  . clopidogrel  75 mg Oral Daily  . divalproex  1,500 mg Oral QHS  . enoxaparin (LOVENOX)  injection  40 mg Subcutaneous Q24H  . ferrous sulfate  325 mg Oral Q breakfast  . fluPHENAZine  4 mg Oral QHS  . midodrine  5 mg Oral BID WC  . traZODone  50 mg Oral QHS   Continuous Infusions:    LOS: 5 days        Hosie Poisson, MD [facility]   To contact the attending provider between 7A-7P or the covering provider during after hours 7P-7A, please log into the web site [email or web address] and access using universal [place] password for that web site. If you do not have the password, please call the hospital operator.  08/07/2019, 5:22 PM

## 2019-08-07 NOTE — TOC Progression Note (Signed)
Transition of Care [facility]) - Progression Note    Patient Details  Name: Bridget Jackson MRN: [id number] Date of Birth: 30-Oct-1950  [facility] CM/SW [place], [place] Phone Number: 08/07/2019, 11:44 AM  Clinical Narrative:     CSW spoke with [place] with [facility]. [facility] can accept patient for SNF placement on 7/6.   CSW will continue to follow.  Expected Discharge Plan: Skilled Nursing Facility Barriers to Discharge: Continued Medical Work up  Expected Discharge Plan and Services Expected Discharge Plan: [place] arrangements for the past 2 months: [place] Expected Discharge Date: 08/08/19                                     Social Determinants of Health (SDOH) Interventions    Readmission Risk Interventions Readmission Risk Prevention Plan 04/18/2019  Post Dischage Appt Not Complete  Appt Comments pending medical stability  Medication Screening Complete  Transportation Screening Complete  Some recent data might be hidden

## 2019-08-07 NOTE — Discharge Summary (Signed)
Physician Discharge Summary  Bridget Jackson HGD:[id number] DOB: 10-08-1950 DOA: 08/01/2019  PCP: Kathyrn Lass, MD  Admit date: 08/01/2019 Discharge date: 08/08/2019  Admitted From: ALF. Disposition:  SNF.   Recommendations for Outpatient Follow-up:  1. Follow up with PCP in 1-2 weeks 2. Please obtain BMP/CBC in one week   Discharge Condition: guarded.  CODE STATUS:DNR  Diet recommendation: Heart Healthy   Brief/Interim Summary: 69 year old lady with prior history of hypertension, hyperlipidemia, CVA, anxiety, schizoaffective disorder, anemia of chronic disease, stage IIIb CKD was brought into ED for generalized weakness, unable to walk, also reports dysuria for about 2 weeks.  On arrival to ED she was found to be hypothermic hypotensive and admitted for evaluation for urinary tract infection.  She was started on IV Rocephin. Her BP have improved. She is not hypothermic anymore. PT evaluation recommended SNF.  Transitioned to oral antibiotics to complete the course.    Discharge Diagnoses:  Principal Problem:   UTI (urinary tract infection) Active Problems:   Debility   Schizoaffective disorder (HCC)   Transient hypotension   Diabetes insipidus ([place])   Subclinical hypothyroidism   Macrocytic anemia   DNR (do not resuscitate)  SIRS/urinary tract infection Urine cultures growing more than 100,000 E. coli.  Currently patient is on IV Rocephin, urine cultures show pan sensitivity.  So far negative blood cultures. Ultrasound renal shows chronic disease.  Transitioned to keflex to complete the course.  No changes in her medications.  Patient denies any dysuria at this time.  Macrocytic anemia-anemia of chronic disease. Hemoglobin stable around 10. Folate and B12 levels are optimal.   AKI Creatinine improved to 1.3.    Chronic thrombocytopenia ? Medication induced.  Improving.  Platelets are 117000 Continue to monitor.   OSA Not on CPAP at home.    Schizoaffective disorder:  Resume depakote and prolixin.    Generalized weakness and Debility:  - deconditioning.  - pt/ot evaluation recommending SNF.  Currently waiting for a bed at SNF.   Hypernatremia: probably from free water deficit.  Dextrose IV fluids started.  Repeat BMP shows improvement in sodium.    Hypotension:  Unclear etiology,  Asymptomatic, fluid bolus ordered.  And blood pressure parameters have improved. A.m. cortisol level ordered to check for adrenal insufficiency. Am cortisol is borderline normal. Will need further evaluation for adrenal insufficiency as outpatient. TSH within normal limits.  Lactic acid is within normal limits     Discharge Instructions  Discharge Instructions    Diet - low sodium heart healthy   Complete by: As directed    Discharge instructions   Complete by: As directed    Please follow up with PCP in one week.     Allergies as of 08/08/2019      Reactions   Lipitor [atorvastatin Calcium] Other (See Comments)   Muscle pain   Provera [medroxyprogesterone Acetate]    Prednisone    Other reaction(s): Other      Medication List    STOP taking these medications   aMILoride 5 MG tablet Commonly known as: MIDAMOR     TAKE these medications   acetaminophen 500 MG tablet Commonly known as: TYLENOL Take 500 mg by mouth every 8 (eight) hours as needed for mild pain.   cephALEXin 500 MG capsule Commonly known as: KEFLEX Take 1 capsule (500 mg total) by mouth every 12 (twelve) hours for 2 days.   clopidogrel 75 MG tablet Commonly known as: PLAVIX Take 75 mg by mouth daily.   divalproex 250  MG 24 hr tablet Commonly known as: DEPAKOTE ER Take 6 tablets (1,500 mg total) by mouth at bedtime.   ferrous sulfate 325 (65 FE) MG tablet Take 325 mg by mouth daily with breakfast.   fluPHENAZine 1 MG tablet Commonly known as: PROLIXIN Take 4 tablets (4 mg total) by mouth at bedtime.   midodrine 5 MG  tablet Commonly known as: PROAMATINE Take 1 tablet (5 mg total) by mouth 2 (two) times daily with a meal.   Multivitamin Adults Tabs Take 1 tablet by mouth daily.   ondansetron 4 MG tablet Commonly known as: ZOFRAN Take 1 tablet (4 mg total) by mouth every 6 (six) hours as needed for nausea.   traZODone 50 MG tablet Commonly known as: DESYREL Take 1 tablet (50 mg total) by mouth at bedtime.   Vitamin D (Ergocalciferol) 1.25 MG (50000 UNIT) Caps capsule Commonly known as: DRISDOL Take 50,000 Units by mouth every Wednesday.       Contact information for after-discharge care    Destination    [facility] .   Service: Skilled Nursing Contact information: [place] [address] [phone]                 Allergies  Allergen Reactions  . Lipitor [Atorvastatin Calcium] Other (See Comments)    Muscle pain  . Provera [Medroxyprogesterone Acetate]   . Prednisone     Other reaction(s): Other    Consultations:  None.    Procedures/Studies: DG Chest 2 View  Result Date: 08/02/2019 CLINICAL DATA:  Short of breath, hypertension EXAM: CHEST - 2 VIEW COMPARISON:  03/20/2018 FINDINGS: The heart size and mediastinal contours are within normal limits. Both lungs are clear. The visualized skeletal structures are unremarkable. IMPRESSION: No active cardiopulmonary disease. Electronically Signed   By: Randa Ngo M.D.   On: 08/02/2019 02:01   US RENAL  Result Date: 08/02/2019 CLINICAL DATA:  Urinary tract infection. EXAM: RENAL / URINARY TRACT ULTRASOUND COMPLETE COMPARISON:  CT scan dated 01/13/2018 FINDINGS: Right Kidney: Renal measurements: 2.0 x 5.3 x 5.0 cm = volume: 136 mL. There is diffuse abnormal irregular increased echogenicity of the renal parenchyma with hypoechogenicity of the renal pyramids. Previous CT scan demonstrates peripheral tiny cortical calcifications in both kidneys. 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 No hydronephrosis. No masses. Left Kidney: Renal measurements: 10.5 x 5.4 x 5.6 cm = volume: 66 mL. 19 mm cyst on the upper pole. Diffuse abnormal increased inhomogeneous echogenicity of the renal cortex with hypoechogenicity of the renal pyramids. No solid masses. No hydronephrosis. Bladder: Appears normal for degree of bladder distention. Other: None IMPRESSION: Chronic abnormalities of the renal parenchyma as described above. The possibility of chronic glomerulonephritis or chronic renal cortical necrosis should be considered Electronically Signed   By: Lorriane Shire M.D.   On: 08/02/2019 13:33   (Echo, Carotid, EGD, Colonoscopy, ERCP)    Subjective:   Discharge Exam: Vitals:   08/08/19 0529 08/08/19 0813  BP: (!) 98/57 (!) 93/48  Pulse: (!) 53 (!) 51  Resp: 18 16  Temp: (!) 97.5 F (36.4 C) 97.9 F (36.6 C)  SpO2: 99% 99%   Vitals:   08/07/19 2044 08/08/19 0017 08/08/19 0529 08/08/19 0813  BP: 111/65 (!) 95/52 (!) 98/57 (!) 93/48  Pulse: (!) 55 (!) 50 (!) 53 (!) 51  Resp: 17 19 18 16   Temp: (!) 97.4 F (36.3 C) 97.6 F (36.4 C) (!) 97.5 F (36.4  C) 97.9 F (36.6 C)  TempSrc: Axillary Axillary Oral Axillary  SpO2:  100% 99% 99%  Weight:   74 kg   Height:        General: Pt is alert, awake, not in acute distress Cardiovascular: RRR, S1/S2 +, no rubs, no gallops Respiratory: CTA bilaterally, no wheezing, no rhonchi Abdominal: Soft, NT, ND, bowel sounds + Extremities: no edema, no cyanosis    The results of significant diagnostics from this hospitalization (including imaging, microbiology, ancillary and laboratory) are listed below for reference.     Microbiology: Recent Results (from the past 240 hour(s))  SARS Coronavirus 2 by RT PCR (hospital order, performed in [facility] hospital lab) Nasopharyngeal Nasopharyngeal Swab     Status: None   Collection Time: 08/02/19  6:51 AM   Specimen: Nasopharyngeal Swab  Result  Value Ref Range Status   SARS Coronavirus 2 NEGATIVE NEGATIVE Final    Comment: (NOTE) SARS-CoV-2 target nucleic acids are NOT DETECTED.  The SARS-CoV-2 RNA is generally detectable in upper and lower respiratory specimens during the acute phase of infection. The lowest concentration of SARS-CoV-2 viral copies this assay can detect is 250 copies / mL. A negative result does not preclude SARS-CoV-2 infection and should not be used as the sole basis for treatment or other patient management decisions.  A negative result may occur with improper specimen collection / handling, submission of specimen other than nasopharyngeal swab, presence of viral mutation(s) within the areas targeted by this assay, and inadequate number of viral copies (<250 copies / mL). A negative result must be combined with clinical observations, patient history, and epidemiological information.  Fact Sheet for Patients:   StrictlyIdeas.no  Fact Sheet for Healthcare Providers: BankingDealers.co.za  This test is not yet approved or  cleared by the [place] FDA and has been authorized for detection and/or diagnosis of SARS-CoV-2 by FDA under an Emergency Use Authorization (EUA).  This EUA will remain in effect (meaning this test can be used) for the duration of the COVID-19 declaration under Section 564(b)(1) of the Act, 21 U.S.C. section 360bbb-3(b)(1), unless the authorization is terminated or revoked sooner.  Performed at [facility], [place] [address], [place], [place] [address]   Urine culture     Status: Abnormal   Collection Time: 08/02/19  8:31 AM   Specimen: Urine, Random  Result Value Ref Range Status   Specimen Description URINE, RANDOM  Final   Special Requests   Final    NONE Performed at [facility], [place] [address], [place], [place] [address]    Culture >=100,000 COLONIES/mL ESCHERICHIA COLI (A)  Final   Report Status 08/04/2019  FINAL  Final   Organism ID, Bacteria ESCHERICHIA COLI (A)  Final      Susceptibility   Escherichia coli - MIC*    AMPICILLIN 4 SENSITIVE Sensitive     CEFAZOLIN <=4 SENSITIVE Sensitive     CEFTRIAXONE <=0.25 SENSITIVE Sensitive     CIPROFLOXACIN <=0.25 SENSITIVE Sensitive     GENTAMICIN <=1 SENSITIVE Sensitive     IMIPENEM <=0.25 SENSITIVE Sensitive     NITROFURANTOIN <=16 SENSITIVE Sensitive     TRIMETH/SULFA <=20 SENSITIVE Sensitive     AMPICILLIN/SULBACTAM <=2 SENSITIVE Sensitive     PIP/TAZO <=4 SENSITIVE Sensitive     * >=100,000 COLONIES/mL ESCHERICHIA COLI  Culture, blood (routine x 2)     Status: None (Preliminary result)   Collection Time: 08/03/19  4:49 AM   Specimen: BLOOD  Result Value  Ref Range Status   Specimen Description BLOOD LEFT ARM  Final   Special Requests   Final    BOTTLES DRAWN AEROBIC AND ANAEROBIC Blood Culture adequate volume   Culture   Final    NO GROWTH 4 DAYS Performed at [facility], [address] [place], [place] [address]    Report Status PENDING  Incomplete  Culture, blood (routine x 2)     Status: None (Preliminary result)   Collection Time: 08/03/19  4:57 AM   Specimen: BLOOD  Result Value Ref Range Status   Specimen Description BLOOD RIGHT ARM  Final   Special Requests   Final    BOTTLES DRAWN AEROBIC ONLY Blood Culture adequate volume   Culture   Final    NO GROWTH 4 DAYS Performed at [facility], [place] [address], [place], [place] [address]    Report Status PENDING  Incomplete  SARS Coronavirus 2 by RT PCR (hospital order, performed in [facility] hospital lab) Nasopharyngeal Nasopharyngeal Swab     Status: None   Collection Time: 08/06/19  3:02 PM   Specimen: Nasopharyngeal Swab  Result Value Ref Range Status   SARS Coronavirus 2 NEGATIVE NEGATIVE Final    Comment: (NOTE) SARS-CoV-2 target nucleic acids are NOT DETECTED.  The SARS-CoV-2 RNA is generally detectable in upper and lower respiratory specimens during the  acute phase of infection. The lowest concentration of SARS-CoV-2 viral copies this assay can detect is 250 copies / mL. A negative result does not preclude SARS-CoV-2 infection and should not be used as the sole basis for treatment or other patient management decisions.  A negative result may occur with improper specimen collection / handling, submission of specimen other than nasopharyngeal swab, presence of viral mutation(s) within the areas targeted by this assay, and inadequate number of viral copies (<250 copies / mL). A negative result must be combined with clinical observations, patient history, and epidemiological information.  Fact Sheet for Patients:   StrictlyIdeas.no  Fact Sheet for Healthcare Providers: BankingDealers.co.za  This test is not yet approved or  cleared by the [place] FDA and has been authorized for detection and/or diagnosis of SARS-CoV-2 by FDA under an Emergency Use Authorization (EUA).  This EUA will remain in effect (meaning this test can be used) for the duration of the COVID-19 declaration under Section 564(b)(1) of the Act, 21 U.S.C. section 360bbb-3(b)(1), unless the authorization is terminated or revoked sooner.  Performed at [facility], [place] [address], [place], [place] [address]      Labs: BNP (last 3 results) Recent Labs    08/02/19 0103  BNP 67.2   Basic Metabolic Panel: Recent Labs  Lab 08/01/19 2109 08/03/19 0407 08/04/19 1034 08/05/19 0519 08/06/19 0253  NA 140 145 147* 142 143  K 4.6 3.8 4.0 4.4 4.0  CL 109 118* 119* 114* 113*  CO2 22 23 20* 18* 23  GLUCOSE 88 93 122* 87 93  BUN 25* 16 15 16 18   CREATININE 1.62* 1.16* 1.45* 1.37* 1.31*  CALCIUM 8.8* 8.1* 8.4* 8.4* 8.4*   Liver Function Tests: Recent Labs  Lab 08/03/19 0407  AST 24  ALT 21  ALKPHOS 53  BILITOT 0.4  PROT 4.3*  ALBUMIN 2.2*   No results for input(s): LIPASE, AMYLASE in the last 168  hours. No results for input(s): AMMONIA in the last 168 hours. CBC: Recent Labs  Lab 08/01/19 2109 08/01/19 2109 08/02/19 1420 08/03/19 0441 08/04/19 1034 08/05/19 0947  08/06/19 0253  WBC 4.0  --   --  3.5* 3.9* 4.3 5.0  HGB 10.8*   < > 9.8* 10.0* 10.2* 10.1* 10.1*  HCT 32.8*   < > 30.5* 30.2* 32.1* 30.8* 31.3*  MCV 100.6*  --   --  101.3* 103.5* 103.0* 102.0*  PLT 105*  --   --  91* 116* 121* 117*   < > = values in this interval not displayed.   Cardiac Enzymes: No results for input(s): CKTOTAL, CKMB, CKMBINDEX, TROPONINI in the last 168 hours. BNP: Invalid input(s): POCBNP CBG: Recent Labs  Lab 08/02/19 0721  GLUCAP 77   D-Dimer No results for input(s): DDIMER in the last 72 hours. Hgb A1c No results for input(s): HGBA1C in the last 72 hours. Lipid Profile No results for input(s): CHOL, HDL, LDLCALC, TRIG, CHOLHDL, LDLDIRECT in the last 72 hours. Thyroid function studies No results for input(s): TSH, T4TOTAL, T3FREE, THYROIDAB in the last 72 hours.  Invalid input(s): FREET3 Anemia work up No results for input(s): VITAMINB12, FOLATE, FERRITIN, TIBC, IRON, RETICCTPCT in the last 72 hours. Urinalysis    Component Value Date/Time   COLORURINE YELLOW 08/02/2019 0550   APPEARANCEUR HAZY (A) 08/02/2019 0550   LABSPEC 1.006 08/02/2019 0550   PHURINE 6.0 08/02/2019 0550   GLUCOSEU NEGATIVE 08/02/2019 0550   HGBUR SMALL (A) 08/02/2019 0550   BILIRUBINUR NEGATIVE 08/02/2019 0550   KETONESUR NEGATIVE 08/02/2019 0550   PROTEINUR NEGATIVE 08/02/2019 0550   NITRITE POSITIVE (A) 08/02/2019 0550   LEUKOCYTESUR LARGE (A) 08/02/2019 0550   Sepsis Labs Invalid input(s): PROCALCITONIN,  WBC,  LACTICIDVEN Microbiology Recent Results (from the past 240 hour(s))  SARS Coronavirus 2 by RT PCR (hospital order, performed in [place] hospital lab) Nasopharyngeal Nasopharyngeal Swab     Status: None   Collection Time: 08/02/19  6:51 AM   Specimen: Nasopharyngeal Swab  Result  Value Ref Range Status   SARS Coronavirus 2 NEGATIVE NEGATIVE Final    Comment: (NOTE) SARS-CoV-2 target nucleic acids are NOT DETECTED.  The SARS-CoV-2 RNA is generally detectable in upper and lower respiratory specimens during the acute phase of infection. The lowest concentration of SARS-CoV-2 viral copies this assay can detect is 250 copies / mL. A negative result does not preclude SARS-CoV-2 infection and should not be used as the sole basis for treatment or other patient management decisions.  A negative result may occur with improper specimen collection / handling, submission of specimen other than nasopharyngeal swab, presence of viral mutation(s) within the areas targeted by this assay, and inadequate number of viral copies (<250 copies / mL). A negative result must be combined with clinical observations, patient history, and epidemiological information.  Fact Sheet for Patients:   StrictlyIdeas.no  Fact Sheet for Healthcare Providers: BankingDealers.co.za  This test is not yet approved or  cleared by the [place] FDA and has been authorized for detection and/or diagnosis of SARS-CoV-2 by FDA under an Emergency Use Authorization (EUA).  This EUA will remain in effect (meaning this test can be used) for the duration of the COVID-19 declaration under Section 564(b)(1) of the Act, 21 U.S.C. section 360bbb-3(b)(1), unless the authorization is terminated or revoked sooner.  Performed at [facility], [place] [address], [place], [place] [address]   Urine culture     Status: Abnormal   Collection Time: 08/02/19  8:31 AM   Specimen: Urine, Random  Result Value Ref Range Status   Specimen Description URINE, RANDOM  Final   Special  Requests   Final    NONE Performed at [facility], [place] [address] [place], [place] [address]    Culture >=100,000 COLONIES/mL ESCHERICHIA COLI (A)  Final   Report Status 08/04/2019  FINAL  Final   Organism ID, Bacteria ESCHERICHIA COLI (A)  Final      Susceptibility   Escherichia coli - MIC*    AMPICILLIN 4 SENSITIVE Sensitive     CEFAZOLIN <=4 SENSITIVE Sensitive     CEFTRIAXONE <=0.25 SENSITIVE Sensitive     CIPROFLOXACIN <=0.25 SENSITIVE Sensitive     GENTAMICIN <=1 SENSITIVE Sensitive     IMIPENEM <=0.25 SENSITIVE Sensitive     NITROFURANTOIN <=16 SENSITIVE Sensitive     TRIMETH/SULFA <=20 SENSITIVE Sensitive     AMPICILLIN/SULBACTAM <=2 SENSITIVE Sensitive     PIP/TAZO <=4 SENSITIVE Sensitive     * >=100,000 COLONIES/mL ESCHERICHIA COLI  Culture, blood (routine x 2)     Status: None (Preliminary result)   Collection Time: 08/03/19  4:49 AM   Specimen: BLOOD  Result Value Ref Range Status   Specimen Description BLOOD LEFT ARM  Final   Special Requests   Final    BOTTLES DRAWN AEROBIC AND ANAEROBIC Blood Culture adequate volume   Culture   Final    NO GROWTH 4 DAYS Performed at [facility], [address], [place], [place] [address]    Report Status PENDING  Incomplete  Culture, blood (routine x 2)     Status: None (Preliminary result)   Collection Time: 08/03/19  4:57 AM   Specimen: BLOOD  Result Value Ref Range Status   Specimen Description BLOOD RIGHT ARM  Final   Special Requests   Final    BOTTLES DRAWN AEROBIC ONLY Blood Culture adequate volume   Culture   Final    NO GROWTH 4 DAYS Performed at [facility], [place] [address] [place], [place] [address]    Report Status PENDING  Incomplete  SARS Coronavirus 2 by RT PCR (hospital order, performed in [facility] hospital lab) Nasopharyngeal Nasopharyngeal Swab     Status: None   Collection Time: 08/06/19  3:02 PM   Specimen: Nasopharyngeal Swab  Result Value Ref Range Status   SARS Coronavirus 2 NEGATIVE NEGATIVE Final    Comment: (NOTE) SARS-CoV-2 target nucleic acids are NOT DETECTED.  The SARS-CoV-2 RNA is generally detectable in upper and lower respiratory specimens during the  acute phase of infection. The lowest concentration of SARS-CoV-2 viral copies this assay can detect is 250 copies / mL. A negative result does not preclude SARS-CoV-2 infection and should not be used as the sole basis for treatment or other patient management decisions.  A negative result may occur with improper specimen collection / handling, submission of specimen other than nasopharyngeal swab, presence of viral mutation(s) within the areas targeted by this assay, and inadequate number of viral copies (<250 copies / mL). A negative result must be combined with clinical observations, patient history, and epidemiological information.  Fact Sheet for Patients:   StrictlyIdeas.no  Fact Sheet for Healthcare Providers: BankingDealers.co.za  This test is not yet approved or  cleared by the [place] FDA and has been authorized for detection and/or diagnosis of SARS-CoV-2 by FDA under an Emergency Use Authorization (EUA).  This EUA will remain in effect (meaning this test can be used) for the duration of the COVID-19 declaration under Section 564(b)(1) of the Act, 21 U.S.C. section 360bbb-3(b)(1), unless the authorization is terminated or revoked  sooner.  Performed at [facility], [place] [address] [place], [place] [address]      Time coordinating discharge: 32 minutes.   SIGNED:   Hosie Poisson, MD  [facility] 08/08/2019, 11:08 AM

## 2019-08-07 NOTE — Care Management Important Message (Signed)
Important Message  Patient Details  Name: Fallen Crisostomo MRN: [id number] Date of Birth: 07-31-1950   Medicare Important Message Given:  Yes     Shelda Altes 08/07/2019, 12:17 PM

## 2019-08-08 DIAGNOSIS — R404 Transient alteration of awareness: Secondary | ICD-10-CM | POA: Diagnosis not present

## 2019-08-08 DIAGNOSIS — S59911A Unspecified injury of right forearm, initial encounter: Secondary | ICD-10-CM | POA: Diagnosis present

## 2019-08-08 DIAGNOSIS — F0789 Other personality and behavioral disorders due to known physiological condition: Secondary | ICD-10-CM | POA: Diagnosis not present

## 2019-08-08 DIAGNOSIS — S0083XA Contusion of other part of head, initial encounter: Secondary | ICD-10-CM | POA: Diagnosis not present

## 2019-08-08 DIAGNOSIS — M6258 Muscle wasting and atrophy, not elsewhere classified, other site: Secondary | ICD-10-CM | POA: Diagnosis not present

## 2019-08-08 DIAGNOSIS — N179 Acute kidney failure, unspecified: Secondary | ICD-10-CM | POA: Diagnosis not present

## 2019-08-08 DIAGNOSIS — Z23 Encounter for immunization: Secondary | ICD-10-CM | POA: Diagnosis not present

## 2019-08-08 DIAGNOSIS — R627 Adult failure to thrive: Secondary | ICD-10-CM | POA: Diagnosis not present

## 2019-08-08 DIAGNOSIS — E871 Hypo-osmolality and hyponatremia: Secondary | ICD-10-CM | POA: Diagnosis not present

## 2019-08-08 DIAGNOSIS — R5381 Other malaise: Secondary | ICD-10-CM | POA: Diagnosis not present

## 2019-08-08 DIAGNOSIS — F09 Unspecified mental disorder due to known physiological condition: Secondary | ICD-10-CM | POA: Diagnosis not present

## 2019-08-08 DIAGNOSIS — Y999 Unspecified external cause status: Secondary | ICD-10-CM | POA: Diagnosis not present

## 2019-08-08 DIAGNOSIS — N2889 Other specified disorders of kidney and ureter: Secondary | ICD-10-CM | POA: Diagnosis not present

## 2019-08-08 DIAGNOSIS — R1310 Dysphagia, unspecified: Secondary | ICD-10-CM | POA: Diagnosis not present

## 2019-08-08 DIAGNOSIS — W1789XA Other fall from one level to another, initial encounter: Secondary | ICD-10-CM | POA: Diagnosis not present

## 2019-08-08 DIAGNOSIS — B9620 Unspecified Escherichia coli [E. coli] as the cause of diseases classified elsewhere: Secondary | ICD-10-CM | POA: Diagnosis not present

## 2019-08-08 DIAGNOSIS — W19XXXA Unspecified fall, initial encounter: Secondary | ICD-10-CM | POA: Diagnosis not present

## 2019-08-08 DIAGNOSIS — N289 Disorder of kidney and ureter, unspecified: Secondary | ICD-10-CM | POA: Diagnosis not present

## 2019-08-08 DIAGNOSIS — R52 Pain, unspecified: Secondary | ICD-10-CM | POA: Diagnosis not present

## 2019-08-08 DIAGNOSIS — N183 Chronic kidney disease, stage 3 unspecified: Secondary | ICD-10-CM | POA: Diagnosis not present

## 2019-08-08 DIAGNOSIS — Z743 Need for continuous supervision: Secondary | ICD-10-CM | POA: Diagnosis not present

## 2019-08-08 DIAGNOSIS — S0181XA Laceration without foreign body of other part of head, initial encounter: Secondary | ICD-10-CM | POA: Diagnosis not present

## 2019-08-08 DIAGNOSIS — Y939 Activity, unspecified: Secondary | ICD-10-CM | POA: Diagnosis not present

## 2019-08-08 DIAGNOSIS — E870 Hyperosmolality and hypernatremia: Secondary | ICD-10-CM | POA: Diagnosis not present

## 2019-08-08 DIAGNOSIS — Z7189 Other specified counseling: Secondary | ICD-10-CM | POA: Diagnosis not present

## 2019-08-08 DIAGNOSIS — D539 Nutritional anemia, unspecified: Secondary | ICD-10-CM | POA: Diagnosis not present

## 2019-08-08 DIAGNOSIS — R262 Difficulty in walking, not elsewhere classified: Secondary | ICD-10-CM | POA: Diagnosis not present

## 2019-08-08 DIAGNOSIS — R1312 Dysphagia, oropharyngeal phase: Secondary | ICD-10-CM | POA: Diagnosis not present

## 2019-08-08 DIAGNOSIS — E7220 Disorder of urea cycle metabolism, unspecified: Secondary | ICD-10-CM | POA: Diagnosis not present

## 2019-08-08 DIAGNOSIS — R2681 Unsteadiness on feet: Secondary | ICD-10-CM | POA: Diagnosis not present

## 2019-08-08 DIAGNOSIS — M6281 Muscle weakness (generalized): Secondary | ICD-10-CM | POA: Diagnosis not present

## 2019-08-08 DIAGNOSIS — F419 Anxiety disorder, unspecified: Secondary | ICD-10-CM | POA: Diagnosis not present

## 2019-08-08 DIAGNOSIS — R2689 Other abnormalities of gait and mobility: Secondary | ICD-10-CM | POA: Diagnosis not present

## 2019-08-08 DIAGNOSIS — R55 Syncope and collapse: Secondary | ICD-10-CM | POA: Diagnosis not present

## 2019-08-08 DIAGNOSIS — G4700 Insomnia, unspecified: Secondary | ICD-10-CM | POA: Diagnosis not present

## 2019-08-08 DIAGNOSIS — R4182 Altered mental status, unspecified: Secondary | ICD-10-CM | POA: Diagnosis not present

## 2019-08-08 DIAGNOSIS — S0990XA Unspecified injury of head, initial encounter: Secondary | ICD-10-CM | POA: Diagnosis not present

## 2019-08-08 DIAGNOSIS — R531 Weakness: Secondary | ICD-10-CM | POA: Diagnosis not present

## 2019-08-08 DIAGNOSIS — E43 Unspecified severe protein-calorie malnutrition: Secondary | ICD-10-CM | POA: Diagnosis not present

## 2019-08-08 DIAGNOSIS — R0902 Hypoxemia: Secondary | ICD-10-CM | POA: Diagnosis not present

## 2019-08-08 DIAGNOSIS — J328 Other chronic sinusitis: Secondary | ICD-10-CM | POA: Diagnosis not present

## 2019-08-08 DIAGNOSIS — R519 Headache, unspecified: Secondary | ICD-10-CM | POA: Diagnosis not present

## 2019-08-08 DIAGNOSIS — D696 Thrombocytopenia, unspecified: Secondary | ICD-10-CM | POA: Diagnosis not present

## 2019-08-08 DIAGNOSIS — G319 Degenerative disease of nervous system, unspecified: Secondary | ICD-10-CM | POA: Diagnosis not present

## 2019-08-08 DIAGNOSIS — I252 Old myocardial infarction: Secondary | ICD-10-CM | POA: Diagnosis not present

## 2019-08-08 DIAGNOSIS — R6510 Systemic inflammatory response syndrome (SIRS) of non-infectious origin without acute organ dysfunction: Secondary | ICD-10-CM | POA: Diagnosis not present

## 2019-08-08 DIAGNOSIS — R41841 Cognitive communication deficit: Secondary | ICD-10-CM | POA: Diagnosis not present

## 2019-08-08 DIAGNOSIS — G9389 Other specified disorders of brain: Secondary | ICD-10-CM | POA: Diagnosis not present

## 2019-08-08 DIAGNOSIS — D538 Other specified nutritional anemias: Secondary | ICD-10-CM | POA: Diagnosis not present

## 2019-08-08 DIAGNOSIS — S0003XA Contusion of scalp, initial encounter: Secondary | ICD-10-CM | POA: Diagnosis not present

## 2019-08-08 DIAGNOSIS — M2548 Effusion, other site: Secondary | ICD-10-CM | POA: Diagnosis not present

## 2019-08-08 DIAGNOSIS — E876 Hypokalemia: Secondary | ICD-10-CM | POA: Diagnosis not present

## 2019-08-08 DIAGNOSIS — I9589 Other hypotension: Secondary | ICD-10-CM | POA: Diagnosis not present

## 2019-08-08 DIAGNOSIS — Z20822 Contact with and (suspected) exposure to covid-19: Secondary | ICD-10-CM | POA: Diagnosis not present

## 2019-08-08 DIAGNOSIS — R279 Unspecified lack of coordination: Secondary | ICD-10-CM | POA: Diagnosis not present

## 2019-08-08 DIAGNOSIS — F258 Other schizoaffective disorders: Secondary | ICD-10-CM | POA: Diagnosis not present

## 2019-08-08 DIAGNOSIS — I959 Hypotension, unspecified: Secondary | ICD-10-CM | POA: Diagnosis not present

## 2019-08-08 DIAGNOSIS — Z7401 Bed confinement status: Secondary | ICD-10-CM | POA: Diagnosis not present

## 2019-08-08 DIAGNOSIS — R634 Abnormal weight loss: Secondary | ICD-10-CM | POA: Diagnosis not present

## 2019-08-08 DIAGNOSIS — S51811A Laceration without foreign body of right forearm, initial encounter: Secondary | ICD-10-CM | POA: Diagnosis not present

## 2019-08-08 DIAGNOSIS — D649 Anemia, unspecified: Secondary | ICD-10-CM | POA: Diagnosis not present

## 2019-08-08 DIAGNOSIS — D6949 Other primary thrombocytopenia: Secondary | ICD-10-CM | POA: Diagnosis not present

## 2019-08-08 DIAGNOSIS — Z87440 Personal history of urinary (tract) infections: Secondary | ICD-10-CM | POA: Diagnosis not present

## 2019-08-08 DIAGNOSIS — N390 Urinary tract infection, site not specified: Secondary | ICD-10-CM | POA: Diagnosis not present

## 2019-08-08 DIAGNOSIS — M2550 Pain in unspecified joint: Secondary | ICD-10-CM | POA: Diagnosis not present

## 2019-08-08 DIAGNOSIS — F259 Schizoaffective disorder, unspecified: Secondary | ICD-10-CM | POA: Diagnosis not present

## 2019-08-08 DIAGNOSIS — Y929 Unspecified place or not applicable: Secondary | ICD-10-CM | POA: Diagnosis not present

## 2019-08-08 DIAGNOSIS — F0150 Vascular dementia without behavioral disturbance: Secondary | ICD-10-CM | POA: Diagnosis not present

## 2019-08-08 DIAGNOSIS — I679 Cerebrovascular disease, unspecified: Secondary | ICD-10-CM | POA: Diagnosis not present

## 2019-08-08 DIAGNOSIS — R4189 Other symptoms and signs involving cognitive functions and awareness: Secondary | ICD-10-CM | POA: Diagnosis not present

## 2019-08-08 LAB — CORTISOL: Cortisol, Plasma: 5.5 ug/dL

## 2019-08-08 LAB — CULTURE, BLOOD (ROUTINE X 2)
Culture: NO GROWTH
Culture: NO GROWTH
Special Requests: ADEQUATE
Special Requests: ADEQUATE

## 2019-08-08 NOTE — Consult Note (Signed)
   [facility] Inpatient Consult   08/08/2019  Mackenzee Becvar 11/04/1950 [id number]   [place] [facility] [ACO] Patient: Medicare NextGen   Patient screened for high risk score for unplanned readmission score and for 3 hospitalizations in the past 6 months.  Medical record reviewed to check if potential [facility] service needs.  Review of patient's medical record reveals patient is currently being recommended for a skilled nursing facility level of care.  Patient is from an [place]. Patient is currently to transition to [facility].   Plan:  Will alert [facility] RN [facility] nurse of transitional care needs from skilled nursing facility.  For questions contact:   Natividad Brood, RN BSN [facility] Liaison  [phone] business mobile phone Toll free office [phone]  Fax number: [phone] [email or web address]

## 2019-08-08 NOTE — Progress Notes (Signed)
PHYSICAL THERAPY PROGRESS NOTE  Pt asleep on entry, requiring increase motivation to get out of bed. When sheets pulled back bed found to be soaked in urine. Pt reports she did not think the Purewick was working. Pt agreeable to get up to recliner for breakfast. Pt is limited in safe mobility by decreased strength and endurance. Pt requires min A for bed mobility, transfers and ambulation of 4 feet with RW. PT continues to recommend SNF level rehab at discharge to improve mobility to be safe in her home environment.    08/08/19 1100  PT Visit Information  Last PT Received On 08/08/19  Assistance Needed +1 (+2 for chair follow)  Reason Eval/Treat Not Completed Other (comment)  History of Present Illness Pt is a 69 y/o female admitted from SNF secondary to weakness. Found to have UTI. PMH includes HTN, schizoaffective disorder, OSA, CVA.   Subjective Data  Patient Stated Goal to get out of bed  Precautions  Precautions Fall  Restrictions  Weight Bearing Restrictions No  Pain Assessment  Pain Assessment No/denies pain  Cognition  Arousal/Alertness Awake/alert  Behavior During Therapy WFL for tasks assessed/performed  Overall Cognitive Status History of cognitive impairments - at baseline  General Comments Slow processing, poor safety awareness.  Speaks very directly and gets a little irritated if things do not go as she wants.  Bed Mobility  Overal bed mobility Needs Assistance  Bed Mobility Supine to Sit  Supine to sit Min assist  General bed mobility comments pt able to move LE off bed and requires minA for bringing trunk to upright and scoot to EoB  Transfers  Overall transfer level Needs assistance  Equipment used Rolling walker (2 wheeled)  Transfers Sit to/from Stand  Sit to Stand Min assist  General transfer comment cuing for scoot hips further out before power up  Ambulation/Gait  Ambulation/Gait assistance Min assist  Gait Distance (Feet) 4 Feet  Assistive device Rolling  walker (2 wheeled)  Gait Pattern/deviations Step-through pattern;Decreased stride length;Shuffle;Trunk flexed  General Gait Details min A for ambulation to recliner as breakfast had come  Gait velocity slowed  Gait velocity interpretation <1.31 ft/sec, indicative of household ambulator  Balance  Overall balance assessment Needs assistance  Sitting-balance support No upper extremity supported;Feet supported  Sitting balance-Leahy Scale Fair  Standing balance support Bilateral upper extremity supported;During functional activity  Standing balance-Leahy Scale Poor  Standing balance comment Reliant on BUE support   General Comments  General comments (skin integrity, edema, etc.) VSS on RA   PT - End of Session  Equipment Utilized During Treatment Gait belt  Activity Tolerance Patient limited by fatigue  Patient left in bed;with call bell/phone within reach;with family/visitor present;with chair alarm set  Nurse Communication Mobility status   PT - Assessment/Plan  PT Plan Current plan remains appropriate  PT Visit Diagnosis Unsteadiness on feet (R26.81);Muscle weakness (generalized) (M62.81)  PT Frequency (ACUTE ONLY) Min 2X/week  Follow Up Recommendations SNF  PT equipment None recommended by PT  AM-PAC PT "6 Clicks" Mobility Outcome Measure (Version 2)  Help needed turning from your back to your side while in a flat bed without using bedrails? 3  Help needed moving from lying on your back to sitting on the side of a flat bed without using bedrails? 3  Help needed moving to and from a bed to a chair (including a wheelchair)? 3  Help needed standing up from a chair using your arms (e.g., wheelchair or bedside chair)? 3  Help needed  to walk in hospital room? 3  Help needed climbing 3-5 steps with a railing?  2  6 Click Score 17  Consider Recommendation of Discharge To: Home with [facility]  PT Goal Progression  Progress towards PT goals Progressing toward goals  Acute Rehab PT Goals  PT Goal  Formulation With patient  Time For Goal Achievement 08/16/19  Potential to Achieve Goals Good  PT Time Calculation  PT Start Time (ACUTE ONLY) 0845  PT Stop Time (ACUTE ONLY) 0906  PT Time Calculation (min) (ACUTE ONLY) 21 min  PT General Charges  $$ ACUTE PT VISIT 1 Visit  PT Treatments  $Therapeutic Activity 8-22 mins   Munachimso Rigdon B. Migdalia Dk PT, DPT Acute Rehabilitation Services Pager [phone] Office [phone]

## 2019-08-08 NOTE — Progress Notes (Signed)
Occupational Therapy Treatment Patient Details Name: Bridget Jackson MRN: [id number] DOB: 1951-01-16 Today's Date: 08/08/2019    History of present illness Pt is a 69 y/o female admitted from SNF secondary to weakness. Found to have UTI. PMH includes HTN, schizoaffective disorder, OSA, CVA.    OT comments  Pt's session focused on transferring to [facility] x2 times, pericare and grooming in standing with minguardA to modA overall. Pt progressing well. Pt's VSS. Pt standing at sink x3 mins 2 times with seated rest break in between. Pt requiring increased time and directions repeated; pt able to follow 2 step commands consistently. Pt would benefit from continued OT skilled services. OT following acutely.    Follow Up Recommendations  SNF;Supervision/Assistance - 24 hour    Equipment Recommendations  Other (comment) (defer to facility)    Recommendations for Other Services      Precautions / Restrictions Precautions Precautions: Fall Restrictions Weight Bearing Restrictions: No       Mobility Bed Mobility Overal bed mobility: Needs Assistance Bed Mobility: Supine to Sit     Supine to sit: Min assist     General bed mobility comments: in recliner  Transfers Overall transfer level: Needs assistance Equipment used: Rolling walker (2 wheeled) Transfers: Sit to/from Stand Sit to Stand: Min assist         General transfer comment: cues for hand placement    Balance Overall balance assessment: Needs assistance Sitting-balance support: No upper extremity supported;Feet supported Sitting balance-Leahy Scale: Fair     Standing balance support: Single extremity supported;During functional activity Standing balance-Leahy Scale: Poor Standing balance comment: Pt grooming with single UE supported                           ADL either performed or assessed with clinical judgement   ADL Overall ADL's : Needs assistance/impaired     Grooming: Min guard;Standing;Set  up;Sitting Grooming Details (indicate cue type and reason): Able to stand ~3 mins x2 times             Lower Body Dressing: Moderate assistance;Sit to/from stand   Toilet Transfer: Min Teaching laboratory technician and Hygiene: Min guard;Cueing for safety;Sitting/lateral lean;Sit to/from Sales promotion account executive Details (indicate cue type and reason): "I can't reach" Pt able to reach both anterior and posterior peri care with increased time     Functional mobility during ADLs: Min guard;Rolling walker General ADL Comments: Pt's session focused on transferring to [facility] x2 times, pericare and grooming in standing with minguardA to modA overall.     Vision   Vision Assessment?: No apparent visual deficits   Perception     Praxis      Cognition Arousal/Alertness: Awake/alert Behavior During Therapy: WFL for tasks assessed/performed Overall Cognitive Status: History of cognitive impairments - at baseline                                 General Comments: slow processing and requires directions repeated; follows 2 step commands consistently        Exercises Exercises: General Lower Extremity General Exercises - Lower Extremity Ankle Circles/Pumps: AROM;Both;10 reps;Seated Quad Sets: AROM;Both;10 reps;Seated Long Arc Quad: AROM;Both;10 reps;Seated Heel Slides: AROM;Both;10 reps;Supine Hip ABduction/ADduction: AROM;Both;10 reps;Seated Hip Flexion/Marching: AROM;Both;10 reps;Seated   Shoulder Instructions       General Comments Pt appears enthusiastic about going to SNF.    Pertinent Vitals/  Pain       Pain Assessment: No/denies pain  Home Living                                          Prior Functioning/Environment              Frequency  Min 2X/week        Progress Toward Goals  OT Goals(current goals can now be found in the care plan section)  Progress towards OT goals:  Progressing toward goals  Acute Rehab OT Goals Patient Stated Goal: to get out of bed OT Goal Formulation: With patient Time For Goal Achievement: 08/17/19 Potential to Achieve Goals: Good ADL Goals Pt Will Perform Grooming: with set-up;sitting Pt Will Perform Upper Body Bathing: with set-up;sitting Pt Will Perform Lower Body Dressing: with supervision;with set-up;sit to/from stand Pt Will Transfer to Toilet: with supervision;ambulating;regular height toilet Additional ADL Goal #1: Patient will follow 3 step commands 50% of attempts.  Plan Discharge plan remains appropriate    Co-evaluation                 AM-PAC OT "6 Clicks" Daily Activity     Outcome Measure   Help from another person eating meals?: A Little Help from another person taking care of personal grooming?: A Little Help from another person toileting, which includes using toliet, bedpan, or urinal?: A Lot Help from another person bathing (including washing, rinsing, drying)?: A Lot Help from another person to put on and taking off regular upper body clothing?: A Little Help from another person to put on and taking off regular lower body clothing?: A Lot 6 Click Score: 15    End of Session Equipment Utilized During Treatment: Gait belt;Rolling walker  OT Visit Diagnosis: Unsteadiness on feet (R26.81);Muscle weakness (generalized) (M62.81);Other symptoms and signs involving cognitive function   Activity Tolerance Patient tolerated treatment well   Patient Left in chair;with call bell/phone within reach;with chair alarm set   Nurse Communication Mobility status        Time: 3567-0141 OT Time Calculation (min): 30 min  Charges: OT General Charges $OT Visit: 1 Visit OT Treatments $Self Care/Home Management : 23-37 mins  Jefferey Pica, OTR/L Acute Rehabilitation Services Pager: [phone] Office: [phone]    Jefferey Pica 08/08/2019, 12:35 PM

## 2019-08-08 NOTE — TOC Transition Note (Signed)
Transition of Care [facility]) - CM/SW Discharge Note   Patient Details  Name: Bridget Jackson MRN: [id number] Date of Birth: 1951-01-02  Transition of Care [facility] CM/SW Contact:  Trula Ore, [place] Phone Number: 08/08/2019, 12:29 PM   Clinical Narrative:     Patient will DC to: [place] date: 08/08/2019  Family notified: [name]  Transport by: Corey Harold  ?  Per MD patient ready for DC to [facility] . RN, patient, patient's family, and facility notified of DC. Discharge Summary sent to facility. RN given number for report tele# [phone]. DC packet on chart. Ambulance transport requested for patient.  CSW signing off.    Final next level of care: Skilled Nursing Facility Barriers to Discharge: No Barriers Identified   Patient Goals and CMS Choice Patient states their goals for this hospitalization and ongoing recovery are:: to go to SNF CMS Medicare.gov Compare Post Acute Care list provided to:: Patient Represenative (must comment) (patients sister [name]) Choice offered to / list presented to : Sibling [name])  Discharge Placement              Patient chooses bed at: [facility] Patient to be transferred to facility by: [place] Name of family member notified: Debra Patient and family notified of of transfer: 08/08/19  Discharge Plan and Services                                     Social Determinants of Health ([place]) Interventions     Readmission Risk Interventions Readmission Risk Prevention Plan 08/07/2019 04/18/2019  Post Dischage Appt - Not Complete  Appt Comments - pending medical stability  Medication Screening - Complete  Transportation Screening - Complete  Social Work Consult for [place] Planning/Counseling Complete -  Palliative Care Screening Not Applicable -  Some recent data might be hidden

## 2019-08-09 DIAGNOSIS — R5381 Other malaise: Secondary | ICD-10-CM | POA: Diagnosis not present

## 2019-08-09 DIAGNOSIS — N179 Acute kidney failure, unspecified: Secondary | ICD-10-CM | POA: Diagnosis not present

## 2019-08-09 DIAGNOSIS — N390 Urinary tract infection, site not specified: Secondary | ICD-10-CM | POA: Diagnosis not present

## 2019-08-09 DIAGNOSIS — F259 Schizoaffective disorder, unspecified: Secondary | ICD-10-CM | POA: Diagnosis not present

## 2019-08-10 DIAGNOSIS — N390 Urinary tract infection, site not specified: Secondary | ICD-10-CM | POA: Diagnosis not present

## 2019-08-10 DIAGNOSIS — D539 Nutritional anemia, unspecified: Secondary | ICD-10-CM | POA: Diagnosis not present

## 2019-08-10 DIAGNOSIS — B9620 Unspecified Escherichia coli [E. coli] as the cause of diseases classified elsewhere: Secondary | ICD-10-CM | POA: Diagnosis not present

## 2019-08-10 DIAGNOSIS — R6510 Systemic inflammatory response syndrome (SIRS) of non-infectious origin without acute organ dysfunction: Secondary | ICD-10-CM | POA: Diagnosis not present

## 2019-08-11 ENCOUNTER — Other Ambulatory Visit: Payer: Self-pay | Admitting: *Deleted

## 2019-08-11 NOTE — Patient Outreach (Signed)
Late entry for 08/10/19  Screened for potential [facility] Care Management needs as a benefit of  [employer] Medicare.  Bridget Jackson is receiving skilled therapy at [facility].   Writer attended telephonic interdisciplinary team meeting to assess for disposition needs and transition plan for resident.   Facility reports member has some dizziness with position changes. Family meeting scheduled for 08/11/19. Member from [employer].  Will continue to follow transition plans and for potential [facility] Care Management needs while member resides in [facility].    Bridget Rolling, MSN-Ed, RN,BSN [place] Acute Care Coordinator [phone] [facility]) [phone]  (Toll free office)

## 2019-08-14 ENCOUNTER — Telehealth: Payer: Self-pay | Admitting: Psychiatry

## 2019-08-14 NOTE — Telephone Encounter (Signed)
Bridget Jackson's sister called to report that Bridget Jackson is back in rehab.  Cancelled her appt for tomorrow.  Also wanted you to know that her UTI healed and she is now doing much better mentally.  The depakote is working well.  Will Rs appt when she gets out of rehab.

## 2019-08-15 ENCOUNTER — Ambulatory Visit: Payer: Medicare Other | Admitting: Psychiatry

## 2019-08-17 ENCOUNTER — Other Ambulatory Visit: Payer: Self-pay

## 2019-08-17 DIAGNOSIS — N390 Urinary tract infection, site not specified: Secondary | ICD-10-CM | POA: Diagnosis not present

## 2019-08-17 DIAGNOSIS — I959 Hypotension, unspecified: Secondary | ICD-10-CM | POA: Diagnosis not present

## 2019-08-17 DIAGNOSIS — R5381 Other malaise: Secondary | ICD-10-CM | POA: Diagnosis not present

## 2019-08-17 DIAGNOSIS — F259 Schizoaffective disorder, unspecified: Secondary | ICD-10-CM | POA: Diagnosis not present

## 2019-08-17 NOTE — Patient Outreach (Signed)
[name]) Care Management  08/17/2019  Bridget Jackson 1950/11/13 [id number]  Incoming call received on today from patient's sister [name]. [name] states she is patients POA and wants to discontinue any services with [facility].  CMA shared patient was not currently showing as "Active" with anyone regarding care coordination, but acknowledged seeing prior notes from [name], RN.  CMA will forward concern to RN as well. [facility] fax number provided to [name] as she requested as she wanted ensure to fax over documentation regarding ending services.  Call documented for future reference.  [name] [facility] Management Assistant [phone] (f)

## 2019-08-21 ENCOUNTER — Other Ambulatory Visit: Payer: Self-pay | Admitting: *Deleted

## 2019-08-21 NOTE — Patient Outreach (Signed)
[facility] Post- Acute Care Coordinator follow up  Made aware by [place] Management CMA that member's sister/ DPR/POA [place] contacted the [place] Management office stating she wanted to discontinue [facility] services. Note, member was not active with [facility] Care Management. Writer has been following for potential needs while member resides in [place].  Made aware that member's sister [name] will fax form to office to discontinue [facility] affiliation.   Will not plan to outreach regarding [place] Management follow up per sister POA Debbie Domine's request.    Marthenia Rolling, [place], RN,BSN [place] Acute Care Coordinator [phone] [facility]) [phone]  (Toll free office)

## 2019-08-22 ENCOUNTER — Emergency Department (HOSPITAL_COMMUNITY): Payer: Medicare Other

## 2019-08-22 ENCOUNTER — Encounter (HOSPITAL_COMMUNITY): Payer: Self-pay | Admitting: Emergency Medicine

## 2019-08-22 ENCOUNTER — Emergency Department (HOSPITAL_COMMUNITY)
Admission: EM | Admit: 2019-08-22 | Discharge: 2019-08-23 | Disposition: A | Discharge status: Home / Self Care | Payer: Medicare Other | Attending: Emergency Medicine | Admitting: Emergency Medicine

## 2019-08-22 ENCOUNTER — Other Ambulatory Visit: Payer: Self-pay

## 2019-08-22 DIAGNOSIS — I9589 Other hypotension: Secondary | ICD-10-CM | POA: Diagnosis not present

## 2019-08-22 DIAGNOSIS — R55 Syncope and collapse: Secondary | ICD-10-CM | POA: Diagnosis not present

## 2019-08-22 DIAGNOSIS — Y939 Activity, unspecified: Secondary | ICD-10-CM | POA: Insufficient documentation

## 2019-08-22 DIAGNOSIS — R519 Headache, unspecified: Secondary | ICD-10-CM | POA: Insufficient documentation

## 2019-08-22 DIAGNOSIS — M2548 Effusion, other site: Secondary | ICD-10-CM | POA: Diagnosis not present

## 2019-08-22 DIAGNOSIS — S0083XA Contusion of other part of head, initial encounter: Secondary | ICD-10-CM

## 2019-08-22 DIAGNOSIS — G9389 Other specified disorders of brain: Secondary | ICD-10-CM | POA: Diagnosis not present

## 2019-08-22 DIAGNOSIS — S51811A Laceration without foreign body of right forearm, initial encounter: Secondary | ICD-10-CM | POA: Diagnosis not present

## 2019-08-22 DIAGNOSIS — W19XXXA Unspecified fall, initial encounter: Secondary | ICD-10-CM | POA: Insufficient documentation

## 2019-08-22 DIAGNOSIS — W1789XA Other fall from one level to another, initial encounter: Secondary | ICD-10-CM | POA: Diagnosis not present

## 2019-08-22 DIAGNOSIS — Z23 Encounter for immunization: Secondary | ICD-10-CM | POA: Insufficient documentation

## 2019-08-22 DIAGNOSIS — G319 Degenerative disease of nervous system, unspecified: Secondary | ICD-10-CM | POA: Diagnosis not present

## 2019-08-22 DIAGNOSIS — S0181XA Laceration without foreign body of other part of head, initial encounter: Secondary | ICD-10-CM | POA: Diagnosis not present

## 2019-08-22 DIAGNOSIS — S0003XA Contusion of scalp, initial encounter: Secondary | ICD-10-CM | POA: Diagnosis not present

## 2019-08-22 DIAGNOSIS — Y929 Unspecified place or not applicable: Secondary | ICD-10-CM | POA: Insufficient documentation

## 2019-08-22 DIAGNOSIS — Y999 Unspecified external cause status: Secondary | ICD-10-CM | POA: Insufficient documentation

## 2019-08-22 DIAGNOSIS — S0990XA Unspecified injury of head, initial encounter: Secondary | ICD-10-CM | POA: Diagnosis not present

## 2019-08-22 LAB — URINALYSIS, ROUTINE W REFLEX MICROSCOPIC
Bacteria, UA: NONE SEEN
Bilirubin Urine: NEGATIVE
Glucose, UA: NEGATIVE mg/dL
Hgb urine dipstick: NEGATIVE
Ketones, ur: NEGATIVE mg/dL
Nitrite: NEGATIVE
Protein, ur: NEGATIVE mg/dL
Specific Gravity, Urine: 1.002 — ABNORMAL LOW (ref 1.005–1.030)
pH: 7 (ref 5.0–8.0)

## 2019-08-22 LAB — I-STAT CHEM 8, ED
BUN: 17 mg/dL (ref 8–23)
Calcium, Ion: 1.17 mmol/L (ref 1.15–1.40)
Chloride: 108 mmol/L (ref 98–111)
Creatinine, Ser: 1.3 mg/dL — ABNORMAL HIGH (ref 0.44–1.00)
Glucose, Bld: 114 mg/dL — ABNORMAL HIGH (ref 70–99)
HCT: 26 % — ABNORMAL LOW (ref 36.0–46.0)
Hemoglobin: 8.8 g/dL — ABNORMAL LOW (ref 12.0–15.0)
Potassium: 4.1 mmol/L (ref 3.5–5.1)
Sodium: 140 mmol/L (ref 135–145)
TCO2: 25 mmol/L (ref 22–32)

## 2019-08-22 LAB — COMPREHENSIVE METABOLIC PANEL
ALT: 20 U/L (ref 0–44)
AST: 24 U/L (ref 15–41)
Albumin: 2.7 g/dL — ABNORMAL LOW (ref 3.5–5.0)
Alkaline Phosphatase: 63 U/L (ref 38–126)
Anion gap: 9 (ref 5–15)
BUN: 16 mg/dL (ref 8–23)
CO2: 22 mmol/L (ref 22–32)
Calcium: 8.6 mg/dL — ABNORMAL LOW (ref 8.9–10.3)
Chloride: 108 mmol/L (ref 98–111)
Creatinine, Ser: 1.41 mg/dL — ABNORMAL HIGH (ref 0.44–1.00)
GFR calc Af Amer: 44 mL/min — ABNORMAL LOW (ref >60)
GFR calc non Af Amer: 38 mL/min — ABNORMAL LOW (ref >60)
Glucose, Bld: 119 mg/dL — ABNORMAL HIGH (ref 70–99)
Potassium: 4.1 mmol/L (ref 3.5–5.1)
Sodium: 139 mmol/L (ref 135–145)
Total Bilirubin: 0.7 mg/dL (ref 0.3–1.2)
Total Protein: 5 g/dL — ABNORMAL LOW (ref 6.5–8.1)

## 2019-08-22 LAB — CBC
HCT: 30.5 % — ABNORMAL LOW (ref 36.0–46.0)
Hemoglobin: 9.9 g/dL — ABNORMAL LOW (ref 12.0–15.0)
MCH: 33.6 pg (ref 26.0–34.0)
MCHC: 32.5 g/dL (ref 30.0–36.0)
MCV: 103.4 fL — ABNORMAL HIGH (ref 80.0–100.0)
Platelets: UNDETERMINED 10*3/uL (ref 150–400)
RBC: 2.95 MIL/uL — ABNORMAL LOW (ref 3.87–5.11)
RDW: 16.7 % — ABNORMAL HIGH (ref 11.5–15.5)
WBC: 3.8 10*3/uL — ABNORMAL LOW (ref 4.0–10.5)
nRBC: 0 % (ref 0.0–0.2)

## 2019-08-22 LAB — LACTIC ACID, PLASMA: Lactic Acid, Venous: 1.3 mmol/L (ref 0.5–1.9)

## 2019-08-22 LAB — PROTIME-INR
INR: 1 (ref 0.8–1.2)
Prothrombin Time: 12.7 seconds (ref 11.4–15.2)

## 2019-08-22 LAB — SAMPLE TO BLOOD BANK

## 2019-08-22 LAB — ETHANOL: Alcohol, Ethyl (B): <10 mg/dL (ref <10)

## 2019-08-22 MED ORDER — TETANUS-DIPHTH-ACELL PERTUSSIS 5-2.5-18.5 LF-MCG/0.5 IM SUSP
0.5000 mL | Freq: Once | INTRAMUSCULAR | Status: AC
  Administered 2019-08-22: 0.5 mL via INTRAMUSCULAR
  Filled 2019-08-22: qty 0.5

## 2019-08-22 MED ORDER — SODIUM CHLORIDE 0.9 % IV BOLUS
500.0000 mL | Freq: Once | INTRAVENOUS | Status: AC
  Administered 2019-08-22: 500 mL via INTRAVENOUS

## 2019-08-22 NOTE — ED Notes (Signed)
Report given to Education administrator at [facility]

## 2019-08-22 NOTE — ED Triage Notes (Signed)
Pt BIB [facility] from [facility]. Pt experienced a ground level fall in the restroom. Pt fell forward striking head on tile floor. Pt with decreased blood pressure but has hx of same. Pt VSS. NAD.

## 2019-08-22 NOTE — Discharge Instructions (Signed)
Your work-up today was overall reassuring and we did not find any evidence of significant traumatic injuries aside from the skin tear on your arm which was dressed and the abrasion/contusion on your forehead.  No evidence of acute fracture or bleeding.  We did find the small thyroid nodule which you can follow-up with your primary doctor about.  Please rest and stay hydrated.  Please follow-up with your primary doctor.  If any symptoms change or worsen, please return to the nearest emergency department.

## 2019-08-22 NOTE — ED Notes (Signed)
[facility] called to tranport pt

## 2019-08-22 NOTE — ED Provider Notes (Signed)
[facility] Provider Note   CSN: [id number] Arrival date & time: 08/22/19  1528     History No chief complaint on file.   [facility] is a 69 y.o. female.  The history is provided by the patient and medical records. No language interpreter was used.  Fall This is a new problem. The current episode started less than 1 hour ago. The problem occurs rarely. The problem has not changed since onset.Associated symptoms include headaches. Pertinent negatives include no chest pain, no abdominal pain and no shortness of breath. Nothing aggravates the symptoms. Nothing relieves the symptoms. She has tried nothing for the symptoms. The treatment provided no relief.       No past medical history on file.  There are no problems to display for this patient.  History reviewed. No pertinent surgical history.    OB History   No obstetric history on file.     No family history on file.  Social History   Tobacco Use  . Smoking status: Not on file  Substance Use Topics  . Alcohol use: Not on file  . Drug use: Not on file    Home Medications Prior to Admission medications   Not on File    Allergies    Patient has no allergy information on record.  Review of Systems   Review of Systems  Constitutional: Negative for chills, diaphoresis, fatigue and fever.  HENT: Negative for congestion.   Eyes: Negative for visual disturbance.  Respiratory: Negative for cough, chest tightness, shortness of breath and wheezing.   Cardiovascular: Negative for chest pain.  Gastrointestinal: Negative for abdominal pain, constipation, diarrhea, nausea and vomiting.  Genitourinary: Negative for dysuria and flank pain.  Musculoskeletal: Negative for back pain, neck pain and neck stiffness.  Skin: Positive for wound. Negative for rash.  Neurological: Positive for headaches. Negative for dizziness, syncope, weakness, light-headedness and numbness.    Psychiatric/Behavioral: Negative for agitation and confusion.  All other systems reviewed and are negative.   Physical Exam Updated Vital Signs BP (!) 93/59   Pulse (!) 59   Resp 15   Ht 5\' 3"  (1.6 m)   Wt 74.8 kg   SpO2 99%   BMI 29.23 kg/m   Physical Exam Vitals and nursing note reviewed.  Constitutional:      General: She is not in acute distress.    Appearance: She is well-developed. She is not ill-appearing, toxic-appearing or diaphoretic.  HENT:     Head: Normocephalic. Abrasion and contusion present. No laceration.      Nose: Nose normal. No congestion or rhinorrhea.     Mouth/Throat:     Mouth: Mucous membranes are moist.     Pharynx: No oropharyngeal exudate or posterior oropharyngeal erythema.  Eyes:     Extraocular Movements: Extraocular movements intact.     Conjunctiva/sclera: Conjunctivae normal.     Pupils: Pupils are equal, round, and reactive to light.  Cardiovascular:     Rate and Rhythm: Normal rate and regular rhythm.     Pulses: Normal pulses.     Heart sounds: No murmur heard.   Pulmonary:     Effort: Pulmonary effort is normal. No respiratory distress.     Breath sounds: Normal breath sounds. No wheezing, rhonchi or rales.  Chest:     Chest wall: No tenderness.  Abdominal:     General: Abdomen is flat.     Palpations: Abdomen is soft.     Tenderness: There is no  abdominal tenderness. There is no right CVA tenderness, left CVA tenderness, guarding or rebound.  Musculoskeletal:        General: Tenderness present.     Right forearm: Laceration (skin tear) present.     Cervical back: Neck supple. Tenderness present.     Right lower leg: No edema.     Left lower leg: No edema.     Comments: Small skin tear to right forearm.  Hemostatic.  Nontender.  No bony tenderness or pain with flexion extension of the wrist, pronation supination of the wrist normal grip strength, sensation, pulse no snuffbox tenderness.  Well-appearing.  Skin:    General:  Skin is warm and dry.     Capillary Refill: Capillary refill takes less than 2 seconds.     Findings: Bruising present. No erythema, lesion or rash.  Neurological:     General: No focal deficit present.     Mental Status: She is alert.  Psychiatric:        Mood and Affect: Mood normal.     ED Results / Procedures / Treatments   Labs (all labs ordered are listed, but only abnormal results are displayed) Labs Reviewed  COMPREHENSIVE METABOLIC PANEL - Abnormal; Notable for the following components:      Result Value   Glucose, Bld 119 (*)    Creatinine, Ser 1.41 (*)    Calcium 8.6 (*)    Total Protein 5.0 (*)    Albumin 2.7 (*)    GFR calc non Af Amer 38 (*)    GFR calc Af Amer 44 (*)    All other components within normal limits  CBC - Abnormal; Notable for the following components:   WBC 3.8 (*)    RBC 2.95 (*)    Hemoglobin 9.9 (*)    HCT 30.5 (*)    MCV 103.4 (*)    RDW 16.7 (*)    All other components within normal limits  URINALYSIS, ROUTINE W REFLEX MICROSCOPIC - Abnormal; Notable for the following components:   Color, Urine STRAW (*)    Specific Gravity, Urine 1.002 (*)    Leukocytes,Ua SMALL (*)    All other components within normal limits  I-STAT CHEM 8, ED - Abnormal; Notable for the following components:   Creatinine, Ser 1.30 (*)    Glucose, Bld 114 (*)    Hemoglobin 8.8 (*)    HCT 26.0 (*)    All other components within normal limits  ETHANOL  LACTIC ACID, PLASMA  PROTIME-INR  SAMPLE TO BLOOD BANK    EKG EKG Interpretation  Date/Time:  Tuesday August 22 2019 15:33:26 EDT Ventricular Rate:  57 PR Interval:    QRS Duration: 110 QT Interval:  469 QTC Calculation: 457 R Axis:   -32 Text Interpretation: Sinus rhythm Inferior infarct, old No prior ECG for comparison. No STEMI Confirmed by Antony Blackbird [id number]) on 08/22/2019 3:38:17 PM   Radiology CT Head Wo Contrast  Result Date: 08/22/2019 CLINICAL DATA:  Head trauma, minor. Additional provided:  Fall, trauma, difficulty swallowing. EXAM: CT HEAD WITHOUT CONTRAST TECHNIQUE: Contiguous axial images were obtained from the base of the skull through the vertex without intravenous contrast. COMPARISON:  Prior head CT examinations 06/27/2019 and earlier. FINDINGS: Brain: Stable, moderate generalized parenchymal atrophy. Redemonstrated prominent perivascular space within the inferior left basal ganglia. There is no acute intracranial hemorrhage. No demarcated cortical infarct. No extra-axial fluid collection. No evidence of intracranial mass. No midline shift. Vascular: No hyperdense vessel.  Atherosclerotic calcifications Skull:  Normal. Negative for fracture or focal lesion. Sinuses/Orbits: Visualized orbits show no acute finding. Frothy secretions within the inferior right maxillary sinus. Small mucous retention cysts within the left maxillary and right sphenoid sinuses. Trace ethmoid sinus mucosal thickening. Frothy secretions within posterior right ethmoid air cells. Small right mastoid effusion Other: Left frontotemporal scalp hematoma. IMPRESSION: No evidence of acute intracranial abnormality. Left frontotemporal scalp hematoma. Stable moderate generalized parenchymal atrophy, which is advanced for age. Paranasal sinus disease as described. Correlate for acute on chronic sinusitis. Small right mastoid effusion. Electronically Signed   By: Kellie Simmering DO   On: 08/22/2019 17:21   CT Soft Tissue Neck Wo Contrast  Result Date: 08/22/2019 CLINICAL DATA:  Head trauma, moderate/severe. Fall, trauma, difficulty swallowing EXAM: CT NECK WITHOUT CONTRAST TECHNIQUE: Multidetector CT imaging of the neck was performed following the standard protocol without intravenous contrast. COMPARISON:  Cervical spine CT 01/13/2018 FINDINGS: Pharynx and larynx: Streak artifact from dental restoration limits evaluation of the oral cavity. Within this limitation and within the limitations of a noncontrast examination, there is no  appreciable swelling or discrete mass within the oral cavity, pharynx or larynx. Salivary glands: No inflammation, mass, or stone. Thyroid: Heterogeneous and enlarged thyroid gland with multiple nodules. The largest nodule within the right thyroid lobe measures 1.7 cm Lymph nodes: No pathologically enlarged cervical chain lymph nodes. Vascular: There is limited assessment of the vascular structures of the neck on this noncontrast examination calcified plaque within the visualized aortic arch. Additionally, there is atherosclerotic calcification of the intracranial right vertebral artery. Limited intracranial: Better assessed on concurrently performed head CT. Visualized orbits: Incompletely imaged. Visualized orbits show no acute finding. Mastoids and visualized paranasal sinuses: Small amount of frothy secretions within the inferior right maxillary sinus. Small mucous retention cysts within the visualized left maxillary sinus. No significant mastoid effusion Skeleton: No acute fracture is identified. No acute bony abnormality or aggressive osseous lesion. C4-C5 grade 1 anterolisthesis. Upper chest: No consolidation within the imaged lung apices. No visible pneumothorax. IMPRESSION: Streak artifact from dental restoration limits evaluation of the oral cavity. Within this limitation, and within the limitations of a noncontrast study, there is no appreciable swelling or discrete mass within the oral cavity, pharynx or larynx. No acute bony abnormality is identified. C4-C5 grade 1 anterolisthesis. Heterogeneous and enlarged thyroid gland with multiple nodules. The largest nodule within the right lobe measures 1.7 cm. Nonemergent thyroid ultrasound is recommended for further evaluation. Frothy secretions within the visualized right maxillary sinus. Correlate for acute sinusitis. Small bilateral maxillary sinus mucous retention cysts. Electronically Signed   By: Kellie Simmering DO   On: 08/22/2019 17:11   DG Chest Port 1  View  Result Date: 08/22/2019 CLINICAL DATA:  Fall, chest pain EXAM: PORTABLE CHEST 1 VIEW COMPARISON:  None. FINDINGS: Lung volumes are small, but are symmetric. The generalized the lung apices. Visualized lungs are clear. No definite pneumothorax. No pleural effusion. Cardiac size within normal limits. Pulmonary vascularity normal. No acute bone abnormality. IMPRESSION: No active disease. Electronically Signed   By: Fidela Salisbury MD   On: 08/22/2019 16:11    Procedures Procedures (including critical care time)  Medications Ordered in ED Medications  Tdap (BOOSTRIX) injection 0.5 mL (0.5 mLs Intramuscular Given 08/22/19 1624)  sodium chloride 0.9 % bolus 500 mL (0 mLs Intravenous Stopped 08/22/19 1839)    ED Course  I have reviewed the triage vital signs and the nursing notes.  Pertinent labs & imaging results that were available during  my care of the patient were reviewed by me and considered in my medical decision making (see chart for details).    MDM Rules/Calculators/A&P                          Aashi [facility] is a 69 y.o. female with unknown past medical history aside from Plavix use who presents as a level 2 trauma for fall.  According to EMS, patient had a witnessed fall where she pushed the button at her facility to go to the restroom.  When assistance got to the room, she was already standing up trying to pull up her pants when she overextended herself and toppled over hitting her left forehead on the ground.  She did not lose consciousness.  She otherwise is at her mental status baseline but complaining of a 7 out of 10 headache.  They report that patient always has low blood pressures and is currently on midodrine.  They reportedly just doubled her midodrine dose a week or 2 ago.  She says that over the last several days she has been feeling totally normal the last few days and specifically denies any fevers, chills, chest pain, shortness breath, palpitations, cough, nausea,  vomiting, urinary symptoms or GI symptoms.  She denies any complaints aside from the mild headache and some mild neck discomfort.  Patient blood pressure was over 90 systolic on arrival.  On arrival, airway is intact.  Breath sounds equal bilaterally.  Blood pressure is 93/59.  We will keep her is a level 2 trauma at this time given her history of hypotension and her otherwise well appearance.  Portable chest he will be performed as well a CT head and neck.  On exam, he did have some very mild paraspinal tenderness in the upper cervical spine and some tenderness on her left forehead where she has a small bruise.  Her pupils are symmetric and reactive normal extract movements.  She does have a slightly droopy right eyelid which EMS reports is at her baseline per the facility.  She had clear speech and was alert and oriented x3.  Her lungs were clear and chest was nontender.  Back was nontender.  Abdomen is nontender.  Moving all extremities.  No other injuries aside from a small skin tear on her right forearm.  She is not have any pain in this location.  Good grip strength, sensation, and pulses.  She is unsure of her last tetanus shot, will try to verify when this was however if we cannot find it, will update Tdap.  She will given a very small amount of fluids for the soft blood pressures.  Anticipate discharge if work-up remains reassuring.  7:55 PM Urinalysis does not show infection.  A culture was sent.  Patient will follow up with PCP for further work-up on a thyroid nodule discovered which I discussed with patient and family.  Otherwise, she will follow with PCP for the skin tear and contusion.  Patient otherwise well-appearing and family agrees with discharge.  Patient was able to eat and drink.  Patient be discharged to follow-up with PCP and be careful when standing alone.  They had no other questions or concerns and patient discharged in good condition.   Final Clinical Impression(s) / ED  Diagnoses Final diagnoses:  Fall  Skin tear of right forearm without complication, initial encounter  Contusion of forehead, initial encounter    Rx / DC Orders ED Discharge Orders  None      Clinical Impression: 1. Skin tear of right forearm without complication, initial encounter   2. Fall   3. Contusion of forehead, initial encounter     Disposition: Discharge  Condition: Good  I have discussed the results, Dx and Tx plan with the pt(& family if present). He/she/they expressed understanding and agree(s) with the plan. Discharge instructions discussed at great length. Strict return precautions discussed and pt &/or family have verbalized understanding of the instructions. No further questions at time of discharge.    New Prescriptions   No medications on file    Follow Up: Kathyrn Lass, MD [place] [address] [place] [address] [id number] mc [place]       Zerah Hilyer, Gwenyth Allegra, MD 08/22/19 [phone]

## 2019-08-23 ENCOUNTER — Encounter (HOSPITAL_COMMUNITY): Payer: Self-pay

## 2019-08-23 NOTE — ED Notes (Signed)
Pt verbalized understanding of d/c instructions, follow up  Care and s/s requiring return to ed. Report given to [place] and [facility]. Pt left in care of [facility]

## 2019-08-24 DIAGNOSIS — I9589 Other hypotension: Secondary | ICD-10-CM | POA: Diagnosis not present

## 2019-08-24 DIAGNOSIS — R262 Difficulty in walking, not elsewhere classified: Secondary | ICD-10-CM | POA: Diagnosis not present

## 2019-08-24 DIAGNOSIS — D696 Thrombocytopenia, unspecified: Secondary | ICD-10-CM | POA: Diagnosis not present

## 2019-08-24 DIAGNOSIS — S51811A Laceration without foreign body of right forearm, initial encounter: Secondary | ICD-10-CM | POA: Diagnosis not present

## 2019-08-24 DIAGNOSIS — D538 Other specified nutritional anemias: Secondary | ICD-10-CM | POA: Diagnosis not present

## 2019-08-24 DIAGNOSIS — S0003XA Contusion of scalp, initial encounter: Secondary | ICD-10-CM | POA: Diagnosis not present

## 2019-08-24 DIAGNOSIS — J328 Other chronic sinusitis: Secondary | ICD-10-CM | POA: Diagnosis not present

## 2019-08-24 DIAGNOSIS — F09 Unspecified mental disorder due to known physiological condition: Secondary | ICD-10-CM | POA: Diagnosis not present

## 2019-08-24 DIAGNOSIS — N289 Disorder of kidney and ureter, unspecified: Secondary | ICD-10-CM | POA: Diagnosis not present

## 2019-08-24 DIAGNOSIS — I252 Old myocardial infarction: Secondary | ICD-10-CM | POA: Diagnosis not present

## 2019-08-26 IMAGING — DX DG CHEST 1V PORT
1 series · 1 of 1 positions shown · non-contrast
Comparison: 03/17/2018 chest radiograph.

CLINICAL DATA: Dyspnea

EXAM:
PORTABLE CHEST 1 VIEW

[chest ap]
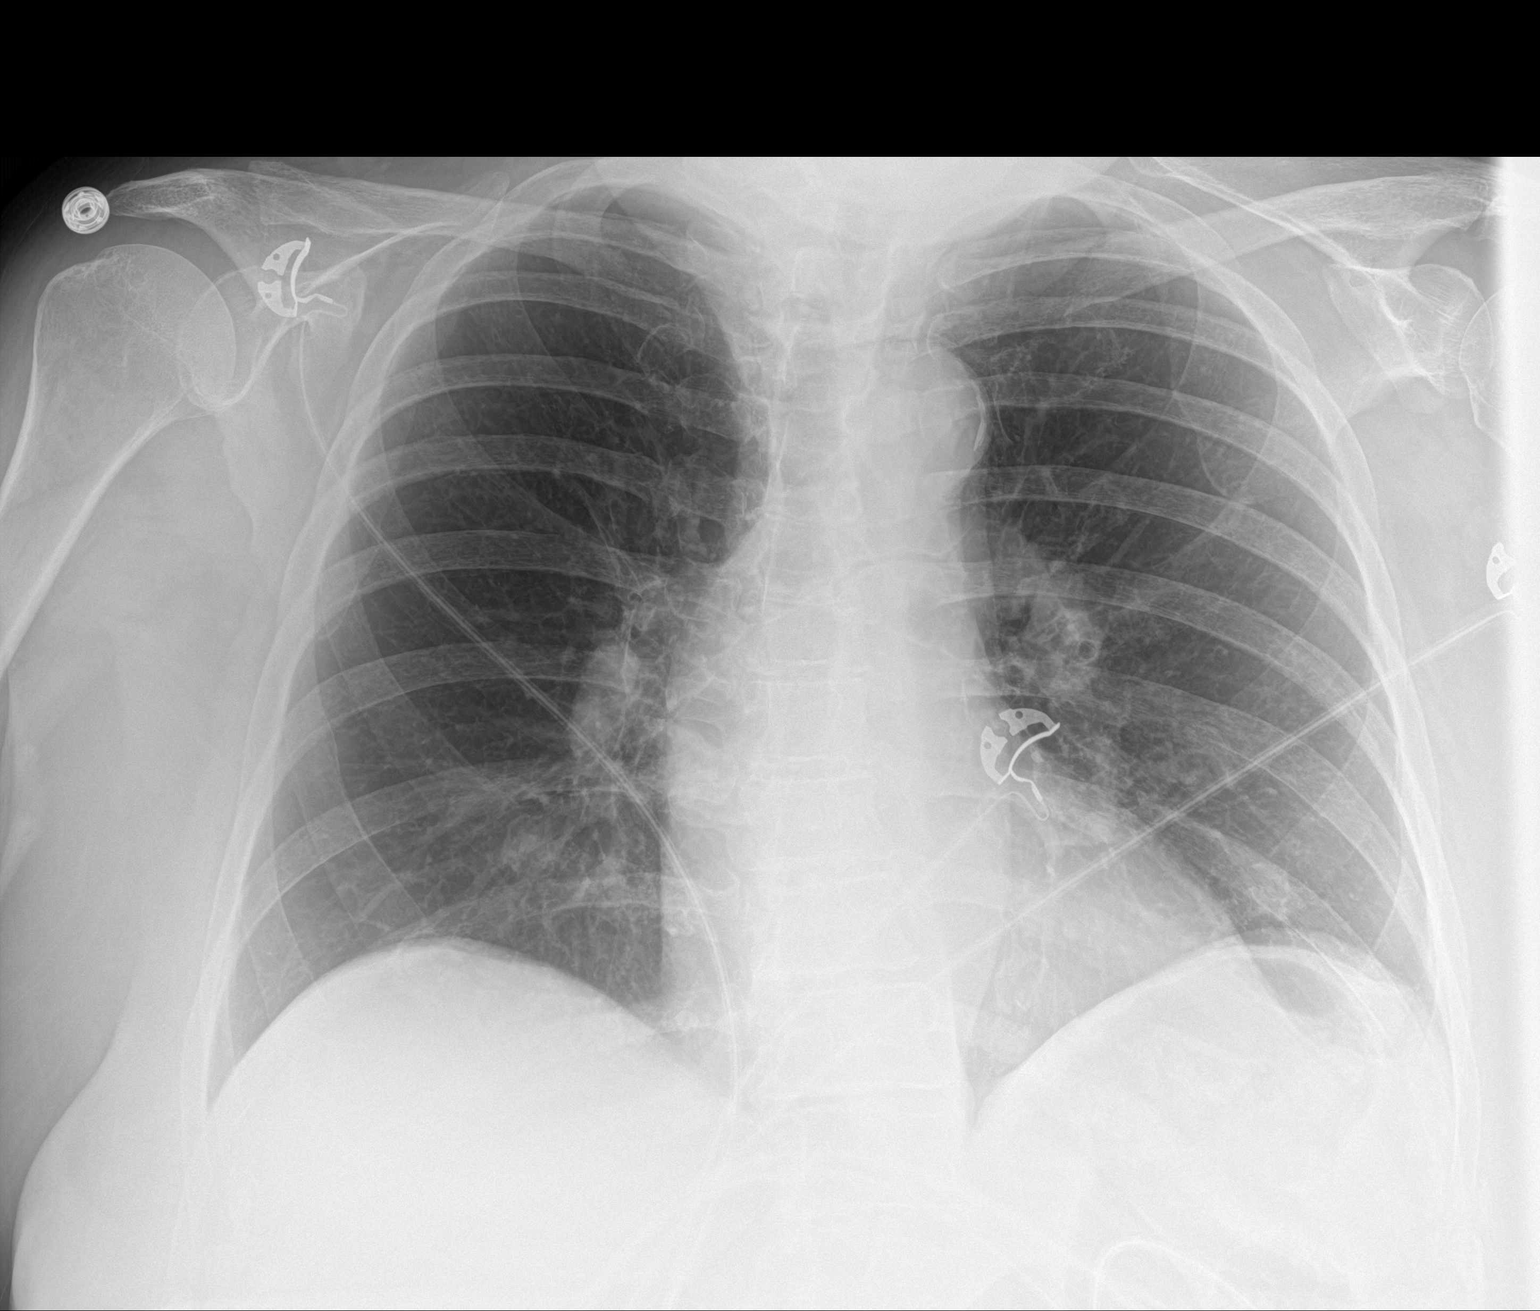

[1 of 1 positions shown; findings below may reference images not displayed]

FINDINGS: Stable cardiomediastinal silhouette with normal heart size. No
pneumothorax. No pleural effusion. Lungs appear clear, with no acute
consolidative airspace disease and no pulmonary edema.
IMPRESSION: No active disease.

## 2019-08-28 DIAGNOSIS — F0150 Vascular dementia without behavioral disturbance: Secondary | ICD-10-CM | POA: Diagnosis not present

## 2019-08-28 DIAGNOSIS — F259 Schizoaffective disorder, unspecified: Secondary | ICD-10-CM | POA: Diagnosis not present

## 2019-08-28 DIAGNOSIS — F419 Anxiety disorder, unspecified: Secondary | ICD-10-CM | POA: Diagnosis not present

## 2019-08-28 DIAGNOSIS — G4700 Insomnia, unspecified: Secondary | ICD-10-CM | POA: Diagnosis not present

## 2019-08-31 DIAGNOSIS — E870 Hyperosmolality and hypernatremia: Secondary | ICD-10-CM | POA: Diagnosis not present

## 2019-08-31 DIAGNOSIS — R4189 Other symptoms and signs involving cognitive functions and awareness: Secondary | ICD-10-CM | POA: Diagnosis not present

## 2019-08-31 DIAGNOSIS — I9589 Other hypotension: Secondary | ICD-10-CM | POA: Diagnosis not present

## 2019-08-31 DIAGNOSIS — W1789XA Other fall from one level to another, initial encounter: Secondary | ICD-10-CM | POA: Diagnosis not present

## 2019-09-04 DIAGNOSIS — R4182 Altered mental status, unspecified: Secondary | ICD-10-CM | POA: Diagnosis not present

## 2019-09-04 DIAGNOSIS — N390 Urinary tract infection, site not specified: Secondary | ICD-10-CM | POA: Diagnosis not present

## 2019-09-04 DIAGNOSIS — E876 Hypokalemia: Secondary | ICD-10-CM | POA: Diagnosis not present

## 2019-09-07 DIAGNOSIS — E876 Hypokalemia: Secondary | ICD-10-CM | POA: Diagnosis not present

## 2019-09-07 DIAGNOSIS — D6949 Other primary thrombocytopenia: Secondary | ICD-10-CM | POA: Diagnosis not present

## 2019-09-07 DIAGNOSIS — R4189 Other symptoms and signs involving cognitive functions and awareness: Secondary | ICD-10-CM | POA: Diagnosis not present

## 2019-09-07 DIAGNOSIS — N2889 Other specified disorders of kidney and ureter: Secondary | ICD-10-CM | POA: Diagnosis not present

## 2019-09-07 DIAGNOSIS — N390 Urinary tract infection, site not specified: Secondary | ICD-10-CM | POA: Diagnosis not present

## 2019-09-07 DIAGNOSIS — F0789 Other personality and behavioral disorders due to known physiological condition: Secondary | ICD-10-CM | POA: Diagnosis not present

## 2019-09-07 DIAGNOSIS — E871 Hypo-osmolality and hyponatremia: Secondary | ICD-10-CM | POA: Diagnosis not present

## 2019-09-07 DIAGNOSIS — F259 Schizoaffective disorder, unspecified: Secondary | ICD-10-CM | POA: Diagnosis not present

## 2019-09-15 DIAGNOSIS — N390 Urinary tract infection, site not specified: Secondary | ICD-10-CM | POA: Diagnosis not present

## 2019-09-15 DIAGNOSIS — F0789 Other personality and behavioral disorders due to known physiological condition: Secondary | ICD-10-CM | POA: Diagnosis not present

## 2019-09-15 DIAGNOSIS — D6949 Other primary thrombocytopenia: Secondary | ICD-10-CM | POA: Diagnosis not present

## 2019-09-15 DIAGNOSIS — F258 Other schizoaffective disorders: Secondary | ICD-10-CM | POA: Diagnosis not present

## 2019-09-15 DIAGNOSIS — R4189 Other symptoms and signs involving cognitive functions and awareness: Secondary | ICD-10-CM | POA: Diagnosis not present

## 2019-09-15 DIAGNOSIS — I9589 Other hypotension: Secondary | ICD-10-CM | POA: Diagnosis not present

## 2019-09-18 DIAGNOSIS — R627 Adult failure to thrive: Secondary | ICD-10-CM | POA: Diagnosis not present

## 2019-09-18 DIAGNOSIS — R4189 Other symptoms and signs involving cognitive functions and awareness: Secondary | ICD-10-CM | POA: Diagnosis not present

## 2019-09-18 DIAGNOSIS — Z7189 Other specified counseling: Secondary | ICD-10-CM | POA: Diagnosis not present

## 2019-09-18 DIAGNOSIS — W1789XA Other fall from one level to another, initial encounter: Secondary | ICD-10-CM | POA: Diagnosis not present

## 2019-09-18 DIAGNOSIS — I9589 Other hypotension: Secondary | ICD-10-CM | POA: Diagnosis not present

## 2019-09-18 DIAGNOSIS — D6949 Other primary thrombocytopenia: Secondary | ICD-10-CM | POA: Diagnosis not present

## 2019-09-18 DIAGNOSIS — F258 Other schizoaffective disorders: Secondary | ICD-10-CM | POA: Diagnosis not present

## 2019-09-20 DIAGNOSIS — Z7189 Other specified counseling: Secondary | ICD-10-CM | POA: Diagnosis not present

## 2019-09-20 DIAGNOSIS — N390 Urinary tract infection, site not specified: Secondary | ICD-10-CM | POA: Diagnosis not present

## 2019-09-20 DIAGNOSIS — I9589 Other hypotension: Secondary | ICD-10-CM | POA: Diagnosis not present

## 2019-09-20 DIAGNOSIS — F09 Unspecified mental disorder due to known physiological condition: Secondary | ICD-10-CM | POA: Diagnosis not present

## 2019-09-20 DIAGNOSIS — E870 Hyperosmolality and hypernatremia: Secondary | ICD-10-CM | POA: Diagnosis not present

## 2019-09-20 DIAGNOSIS — R4189 Other symptoms and signs involving cognitive functions and awareness: Secondary | ICD-10-CM | POA: Diagnosis not present

## 2019-09-20 DIAGNOSIS — E7220 Disorder of urea cycle metabolism, unspecified: Secondary | ICD-10-CM | POA: Diagnosis not present

## 2019-09-20 DIAGNOSIS — N289 Disorder of kidney and ureter, unspecified: Secondary | ICD-10-CM | POA: Diagnosis not present

## 2019-09-20 DIAGNOSIS — R627 Adult failure to thrive: Secondary | ICD-10-CM | POA: Diagnosis not present

## 2019-09-25 DIAGNOSIS — D539 Nutritional anemia, unspecified: Secondary | ICD-10-CM | POA: Diagnosis not present

## 2019-09-25 DIAGNOSIS — F259 Schizoaffective disorder, unspecified: Secondary | ICD-10-CM | POA: Diagnosis not present

## 2019-09-25 DIAGNOSIS — N390 Urinary tract infection, site not specified: Secondary | ICD-10-CM | POA: Diagnosis not present

## 2019-09-25 DIAGNOSIS — N179 Acute kidney failure, unspecified: Secondary | ICD-10-CM | POA: Diagnosis not present

## 2019-10-04 DEATH — deceased

## 2019-10-24 ENCOUNTER — Encounter: Payer: Self-pay | Admitting: Internal Medicine

## 2020-12-02 IMAGING — DX DG KNEE COMPLETE 4+V*R*
4 series · 4 of 4 positions shown · non-contrast
Comparison: 01/05/2018

CLINICAL DATA: Recent fall with knee pain, initial encounter

EXAM:
RIGHT KNEE - COMPLETE 4+ VIEW

[knee ap]
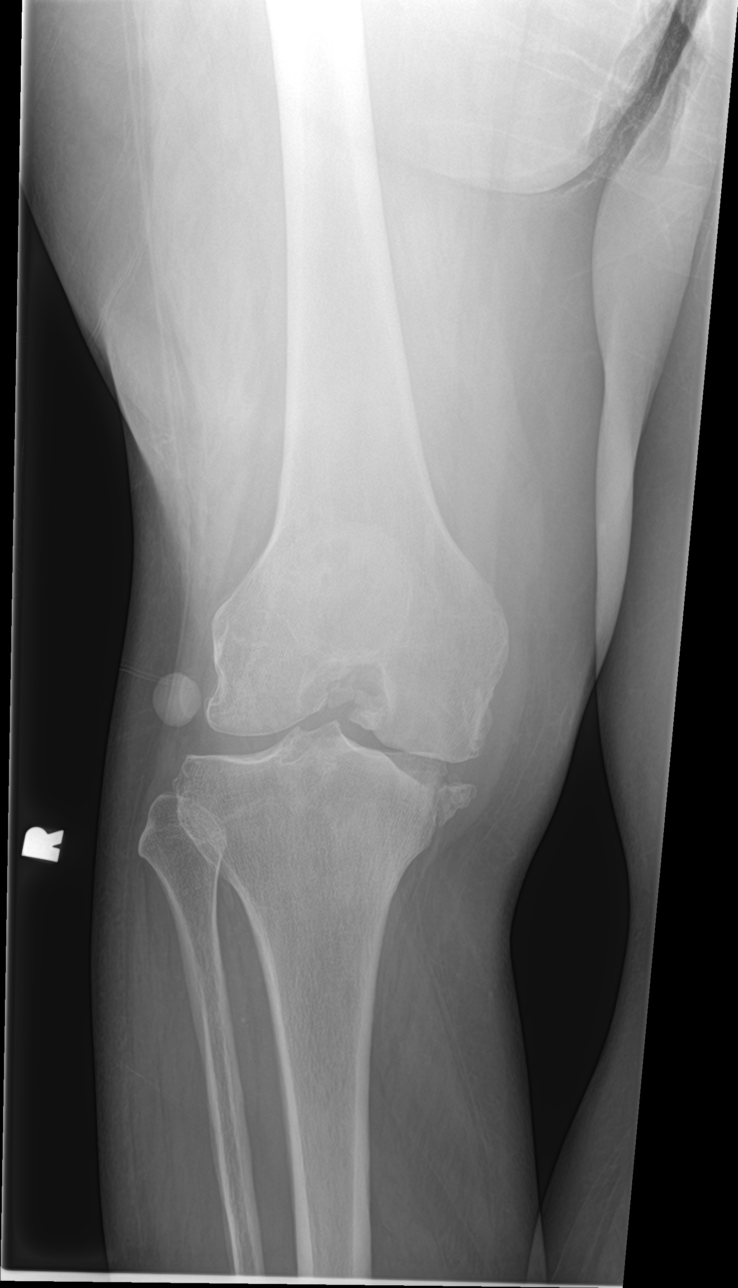

[knee lat]
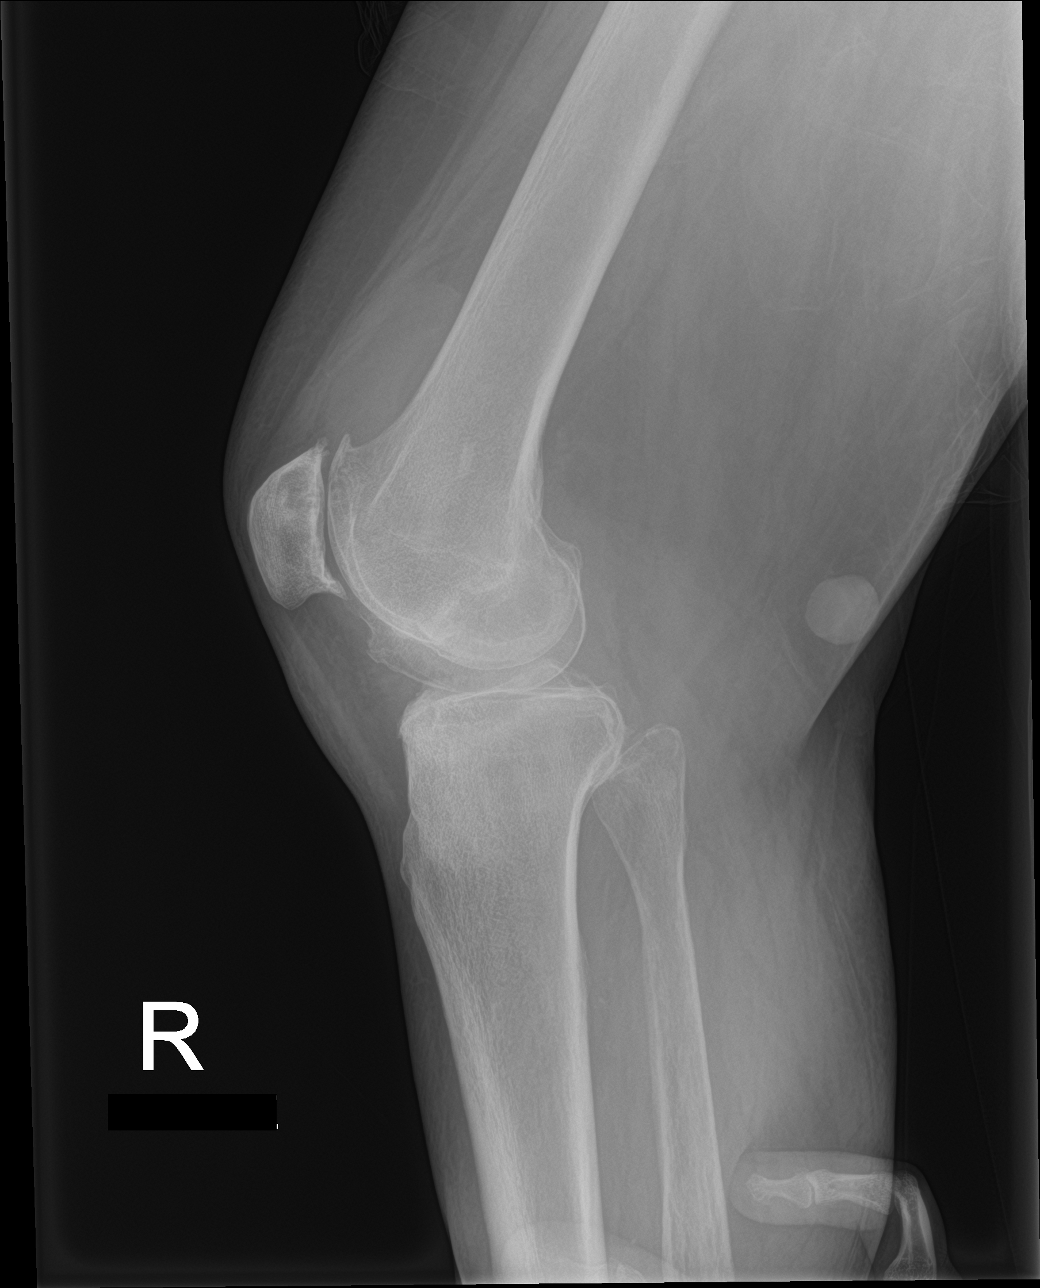

[knee obl (1 of 2)]
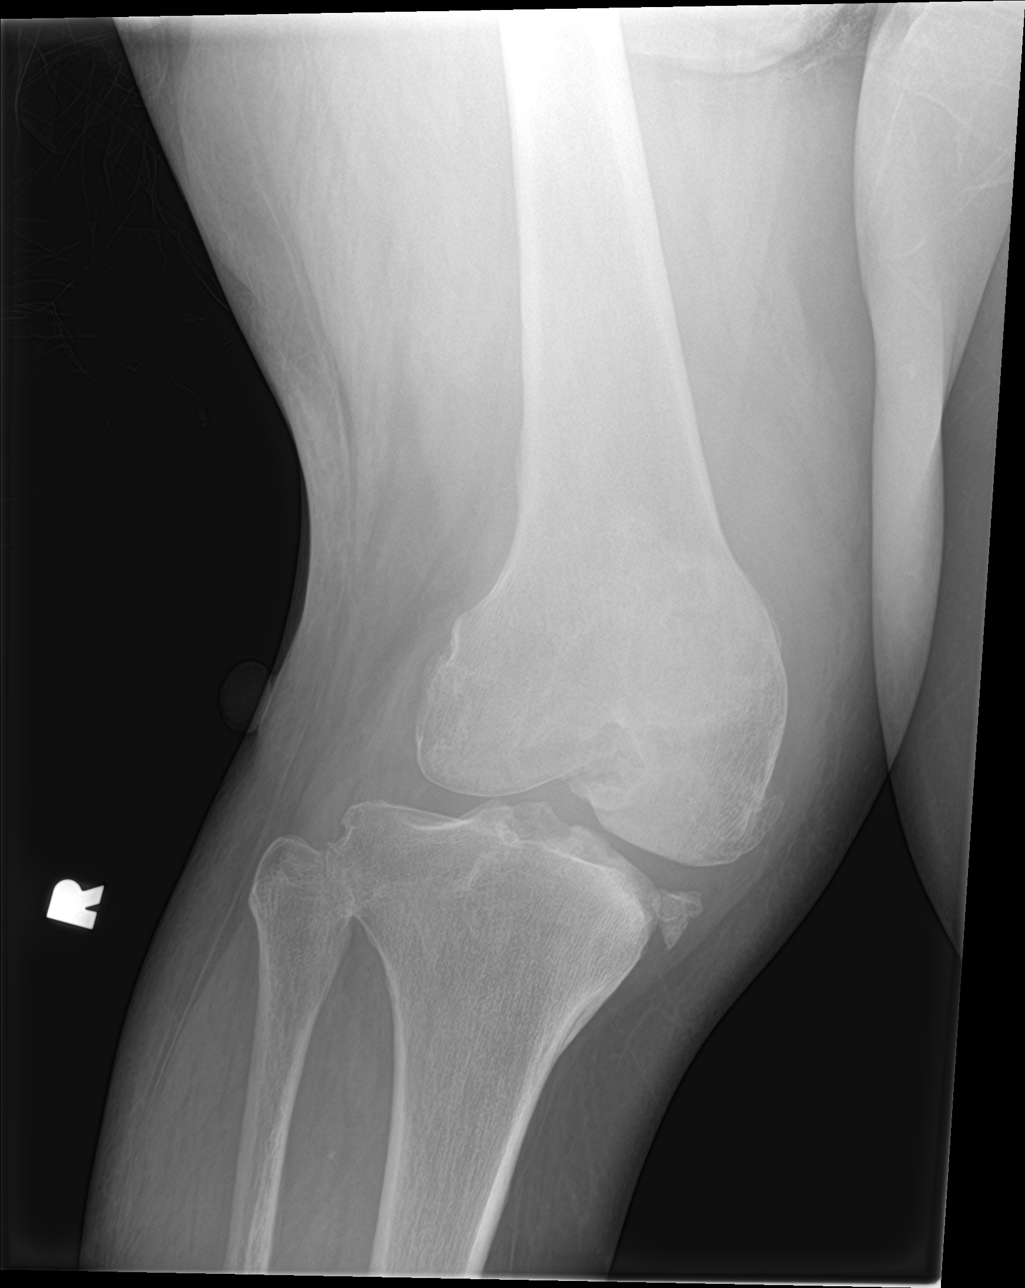

[knee obl (2 of 2)]
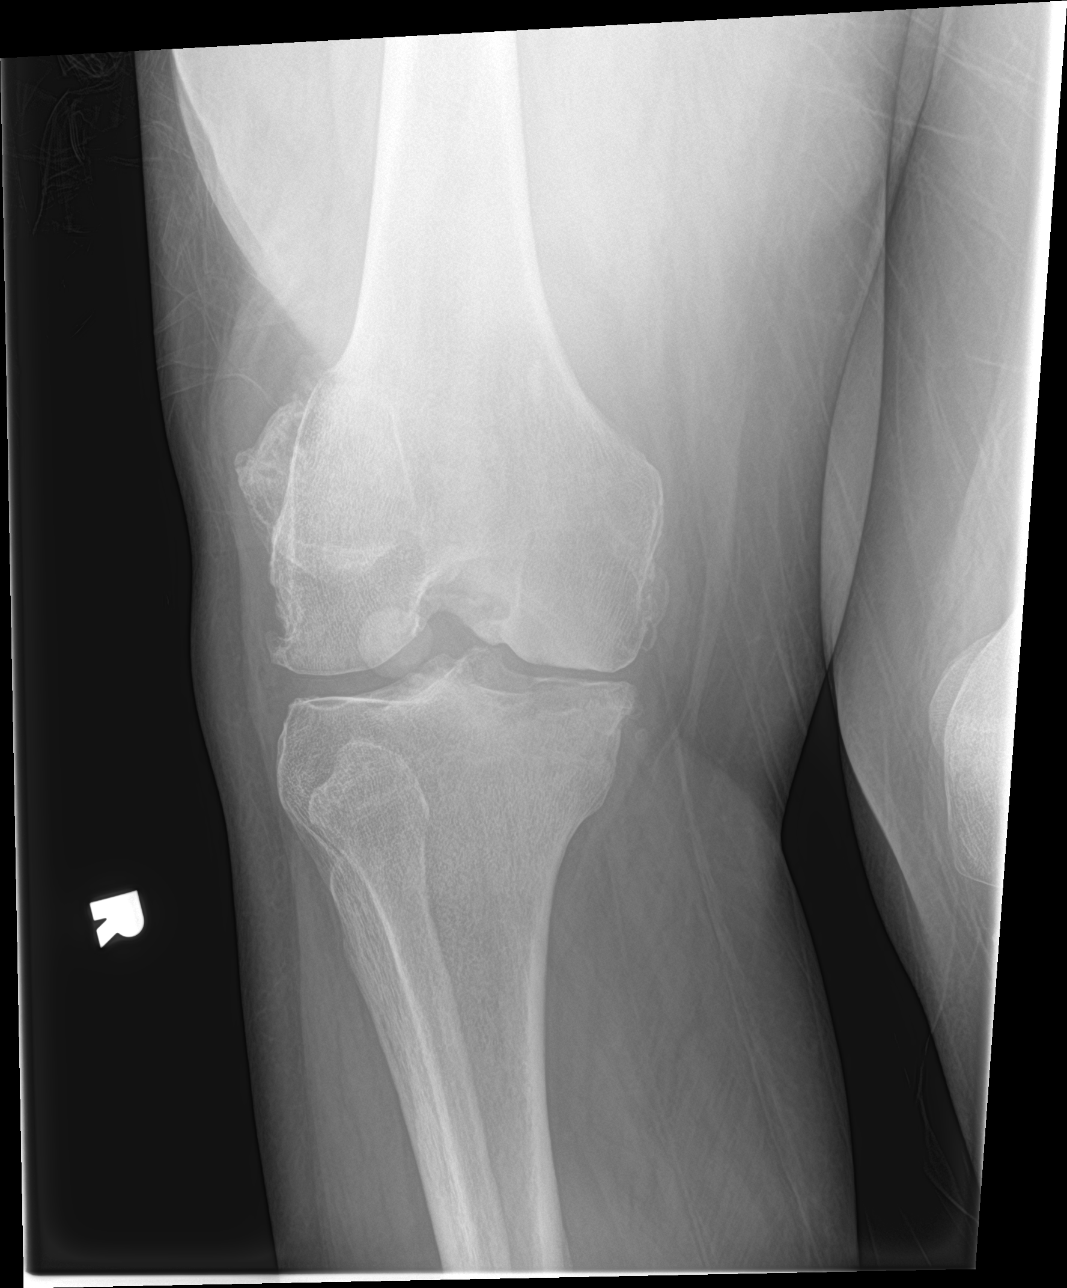

[4 of 4 positions shown; findings below may reference images not displayed]

FINDINGS: Tricompartmental degenerative changes are noted. Prior fracture with
nonunion along the medial tibial plateau is noted. Irregularity of
the medial tibial plateau is noted with multiple linear lucencies
suspicious for undisplaced tibial plateau fracture. Joint effusion
is noted. Soft tissue nodule is noted laterally stable from the
prior exam.
IMPRESSION: Changes suspicious for acute on chronic medial tibial plateau
fracture. CT may be helpful for further evaluation.

## 2022-12-16 ENCOUNTER — Encounter: Payer: Self-pay | Admitting: Psychiatry
